# Patient Record
Sex: Female | Born: 1960 | Race: White | Hispanic: No | Smoking: Current every day smoker
Health system: Southern US, Community
[De-identification: ages and names within clinical notes are randomized; demographics above are authoritative.]

## PROBLEM LIST (undated history)

## (undated) DIAGNOSIS — E039 Hypothyroidism, unspecified: Secondary | ICD-10-CM

## (undated) DIAGNOSIS — D649 Anemia, unspecified: Secondary | ICD-10-CM

## (undated) DIAGNOSIS — N289 Disorder of kidney and ureter, unspecified: Secondary | ICD-10-CM

## (undated) DIAGNOSIS — J449 Chronic obstructive pulmonary disease, unspecified: Secondary | ICD-10-CM

## (undated) DIAGNOSIS — I10 Essential (primary) hypertension: Secondary | ICD-10-CM

## (undated) DIAGNOSIS — E785 Hyperlipidemia, unspecified: Secondary | ICD-10-CM

## (undated) DIAGNOSIS — D696 Thrombocytopenia, unspecified: Secondary | ICD-10-CM

## (undated) DIAGNOSIS — C801 Malignant (primary) neoplasm, unspecified: Secondary | ICD-10-CM

## (undated) DIAGNOSIS — F319 Bipolar disorder, unspecified: Secondary | ICD-10-CM

## (undated) DIAGNOSIS — E119 Type 2 diabetes mellitus without complications: Secondary | ICD-10-CM

## (undated) HISTORY — DX: Thrombocytopenia, unspecified: D69.6

## (undated) HISTORY — DX: Hyperlipidemia, unspecified: E78.5

## (undated) HISTORY — DX: Essential (primary) hypertension: I10

## (undated) HISTORY — DX: Bipolar disorder, unspecified: F31.9

## (undated) HISTORY — DX: Anemia, unspecified: D64.9

## (undated) HISTORY — PX: TONSILLECTOMY: SUR1361

## (undated) HISTORY — DX: Chronic obstructive pulmonary disease, unspecified: J44.9

## (undated) HISTORY — DX: Type 2 diabetes mellitus without complications: E11.9

## (undated) HISTORY — DX: Hypothyroidism, unspecified: E03.9

## (undated) HISTORY — DX: Disorder of kidney and ureter, unspecified: N28.9

---

## 1987-08-31 HISTORY — PX: ANKLE SURGERY: SHX546

## 1998-01-15 ENCOUNTER — Encounter: Admission: RE | Admit: 1998-01-15 | Discharge: 1998-01-15 | Payer: Self-pay | Admitting: Internal Medicine

## 1998-03-19 ENCOUNTER — Encounter: Admission: RE | Admit: 1998-03-19 | Discharge: 1998-03-19 | Payer: Self-pay | Admitting: Internal Medicine

## 1998-03-28 ENCOUNTER — Emergency Department (HOSPITAL_COMMUNITY): Admission: EM | Admit: 1998-03-28 | Discharge: 1998-03-28 | Payer: Self-pay | Admitting: *Deleted

## 1998-03-31 ENCOUNTER — Encounter: Admission: RE | Admit: 1998-03-31 | Discharge: 1998-03-31 | Payer: Self-pay | Admitting: Internal Medicine

## 1998-04-18 ENCOUNTER — Encounter: Admission: RE | Admit: 1998-04-18 | Discharge: 1998-04-18 | Payer: Self-pay | Admitting: Internal Medicine

## 1998-09-11 ENCOUNTER — Encounter: Admission: RE | Admit: 1998-09-11 | Discharge: 1998-09-11 | Payer: Self-pay | Admitting: Hematology and Oncology

## 1998-10-14 ENCOUNTER — Encounter: Admission: RE | Admit: 1998-10-14 | Discharge: 1998-10-14 | Payer: Self-pay | Admitting: Internal Medicine

## 1999-02-17 ENCOUNTER — Encounter: Payer: Self-pay | Admitting: Internal Medicine

## 1999-02-17 ENCOUNTER — Ambulatory Visit (HOSPITAL_COMMUNITY): Admission: RE | Admit: 1999-02-17 | Discharge: 1999-02-17 | Payer: Self-pay | Admitting: Internal Medicine

## 1999-02-17 ENCOUNTER — Encounter: Admission: RE | Admit: 1999-02-17 | Discharge: 1999-02-17 | Payer: Self-pay | Admitting: Internal Medicine

## 1999-03-09 ENCOUNTER — Encounter: Admission: RE | Admit: 1999-03-09 | Discharge: 1999-03-30 | Payer: Self-pay | Admitting: Internal Medicine

## 1999-03-26 ENCOUNTER — Encounter: Admission: RE | Admit: 1999-03-26 | Discharge: 1999-03-30 | Payer: Self-pay | Admitting: Internal Medicine

## 1999-03-31 ENCOUNTER — Encounter: Admission: RE | Admit: 1999-03-31 | Discharge: 1999-06-29 | Payer: Self-pay | Admitting: Internal Medicine

## 1999-05-10 ENCOUNTER — Encounter: Payer: Self-pay | Admitting: Emergency Medicine

## 1999-05-10 ENCOUNTER — Emergency Department (HOSPITAL_COMMUNITY): Admission: EM | Admit: 1999-05-10 | Discharge: 1999-05-10 | Payer: Self-pay | Admitting: *Deleted

## 1999-05-11 ENCOUNTER — Encounter: Admission: RE | Admit: 1999-05-11 | Discharge: 1999-05-11 | Payer: Self-pay | Admitting: Internal Medicine

## 1999-05-15 ENCOUNTER — Encounter: Admission: RE | Admit: 1999-05-15 | Discharge: 1999-05-15 | Payer: Self-pay | Admitting: Internal Medicine

## 1999-06-24 ENCOUNTER — Encounter: Admission: RE | Admit: 1999-06-24 | Discharge: 1999-06-24 | Payer: Self-pay | Admitting: Internal Medicine

## 1999-07-16 ENCOUNTER — Encounter: Admission: RE | Admit: 1999-07-16 | Discharge: 1999-10-14 | Payer: Self-pay | Admitting: Internal Medicine

## 1999-07-20 ENCOUNTER — Encounter: Admission: RE | Admit: 1999-07-20 | Discharge: 1999-07-20 | Payer: Self-pay | Admitting: Internal Medicine

## 1999-09-11 ENCOUNTER — Encounter: Admission: RE | Admit: 1999-09-11 | Discharge: 1999-09-11 | Payer: Self-pay | Admitting: Internal Medicine

## 1999-11-02 ENCOUNTER — Encounter: Admission: RE | Admit: 1999-11-02 | Discharge: 1999-11-02 | Payer: Self-pay | Admitting: Internal Medicine

## 1999-11-10 ENCOUNTER — Encounter: Admission: RE | Admit: 1999-11-10 | Discharge: 1999-11-10 | Payer: Self-pay | Admitting: Internal Medicine

## 1999-11-12 ENCOUNTER — Encounter: Admission: RE | Admit: 1999-11-12 | Discharge: 2000-02-10 | Payer: Self-pay | Admitting: Internal Medicine

## 1999-11-17 ENCOUNTER — Encounter: Admission: RE | Admit: 1999-11-17 | Discharge: 1999-11-17 | Payer: Self-pay | Admitting: Hematology and Oncology

## 1999-11-24 ENCOUNTER — Encounter: Admission: RE | Admit: 1999-11-24 | Discharge: 1999-11-24 | Payer: Self-pay | Admitting: Hematology and Oncology

## 2000-01-25 ENCOUNTER — Encounter: Admission: RE | Admit: 2000-01-25 | Discharge: 2000-01-25 | Payer: Self-pay | Admitting: Internal Medicine

## 2000-02-02 ENCOUNTER — Ambulatory Visit (HOSPITAL_COMMUNITY): Admission: RE | Admit: 2000-02-02 | Discharge: 2000-02-02 | Payer: Self-pay | Admitting: Internal Medicine

## 2000-02-11 ENCOUNTER — Encounter: Admission: RE | Admit: 2000-02-11 | Discharge: 2000-05-11 | Payer: Self-pay | Admitting: Internal Medicine

## 2000-05-12 ENCOUNTER — Encounter: Admission: RE | Admit: 2000-05-12 | Discharge: 2000-05-12 | Payer: Self-pay | Admitting: Hematology and Oncology

## 2000-05-30 ENCOUNTER — Encounter: Admission: RE | Admit: 2000-05-30 | Discharge: 2000-08-28 | Payer: Self-pay | Admitting: Internal Medicine

## 2000-07-07 ENCOUNTER — Encounter: Admission: RE | Admit: 2000-07-07 | Discharge: 2000-07-07 | Payer: Self-pay | Admitting: Hematology and Oncology

## 2000-07-25 ENCOUNTER — Encounter: Admission: RE | Admit: 2000-07-25 | Discharge: 2000-07-25 | Payer: Self-pay | Admitting: Internal Medicine

## 2000-08-11 ENCOUNTER — Encounter: Admission: RE | Admit: 2000-08-11 | Discharge: 2000-08-11 | Payer: Self-pay | Admitting: Hematology and Oncology

## 2000-09-15 ENCOUNTER — Encounter: Admission: RE | Admit: 2000-09-15 | Discharge: 2000-12-14 | Payer: Self-pay | Admitting: Internal Medicine

## 2000-10-31 ENCOUNTER — Encounter: Admission: RE | Admit: 2000-10-31 | Discharge: 2000-10-31 | Payer: Self-pay | Admitting: Internal Medicine

## 2000-11-11 ENCOUNTER — Encounter: Admission: RE | Admit: 2000-11-11 | Discharge: 2000-11-11 | Payer: Self-pay | Admitting: Internal Medicine

## 2001-01-09 ENCOUNTER — Encounter: Admission: RE | Admit: 2001-01-09 | Discharge: 2001-01-09 | Payer: Self-pay | Admitting: Internal Medicine

## 2001-01-13 ENCOUNTER — Encounter: Admission: RE | Admit: 2001-01-13 | Discharge: 2001-04-13 | Payer: Self-pay | Admitting: Internal Medicine

## 2001-01-16 ENCOUNTER — Encounter: Admission: RE | Admit: 2001-01-16 | Discharge: 2001-01-16 | Payer: Self-pay | Admitting: Internal Medicine

## 2001-01-26 ENCOUNTER — Encounter: Admission: RE | Admit: 2001-01-26 | Discharge: 2001-01-26 | Payer: Self-pay | Admitting: Internal Medicine

## 2001-01-27 ENCOUNTER — Emergency Department (HOSPITAL_COMMUNITY): Admission: EM | Admit: 2001-01-27 | Discharge: 2001-01-27 | Payer: Self-pay | Admitting: Emergency Medicine

## 2001-02-10 ENCOUNTER — Encounter: Admission: RE | Admit: 2001-02-10 | Discharge: 2001-02-10 | Payer: Self-pay | Admitting: Internal Medicine

## 2001-02-17 ENCOUNTER — Encounter: Admission: RE | Admit: 2001-02-17 | Discharge: 2001-02-17 | Payer: Self-pay | Admitting: Internal Medicine

## 2001-02-21 ENCOUNTER — Emergency Department (HOSPITAL_COMMUNITY): Admission: EM | Admit: 2001-02-21 | Discharge: 2001-02-22 | Payer: Self-pay | Admitting: Orthopedic Surgery

## 2001-02-23 ENCOUNTER — Emergency Department (HOSPITAL_COMMUNITY): Admission: EM | Admit: 2001-02-23 | Discharge: 2001-02-23 | Payer: Self-pay | Admitting: Emergency Medicine

## 2001-02-24 ENCOUNTER — Encounter: Admission: RE | Admit: 2001-02-24 | Discharge: 2001-02-24 | Payer: Self-pay | Admitting: Internal Medicine

## 2001-03-03 ENCOUNTER — Encounter: Payer: Self-pay | Admitting: Emergency Medicine

## 2001-03-03 ENCOUNTER — Inpatient Hospital Stay (HOSPITAL_COMMUNITY): Admission: EM | Admit: 2001-03-03 | Discharge: 2001-03-08 | Payer: Self-pay | Admitting: Emergency Medicine

## 2001-03-27 ENCOUNTER — Encounter: Admission: RE | Admit: 2001-03-27 | Discharge: 2001-03-27 | Payer: Self-pay | Admitting: Internal Medicine

## 2001-03-29 ENCOUNTER — Encounter: Payer: Self-pay | Admitting: Emergency Medicine

## 2001-03-29 ENCOUNTER — Emergency Department (HOSPITAL_COMMUNITY): Admission: EM | Admit: 2001-03-29 | Discharge: 2001-03-29 | Payer: Self-pay | Admitting: Emergency Medicine

## 2001-04-01 ENCOUNTER — Encounter: Payer: Self-pay | Admitting: *Deleted

## 2001-04-01 ENCOUNTER — Emergency Department (HOSPITAL_COMMUNITY): Admission: EM | Admit: 2001-04-01 | Discharge: 2001-04-02 | Payer: Self-pay | Admitting: *Deleted

## 2001-04-04 ENCOUNTER — Inpatient Hospital Stay (HOSPITAL_COMMUNITY): Admission: EM | Admit: 2001-04-04 | Discharge: 2001-04-10 | Payer: Self-pay | Admitting: Psychiatry

## 2001-04-27 ENCOUNTER — Emergency Department (HOSPITAL_COMMUNITY): Admission: EM | Admit: 2001-04-27 | Discharge: 2001-04-28 | Payer: Self-pay | Admitting: Emergency Medicine

## 2001-04-28 ENCOUNTER — Encounter: Payer: Self-pay | Admitting: Emergency Medicine

## 2001-05-29 ENCOUNTER — Encounter: Admission: RE | Admit: 2001-05-29 | Discharge: 2001-05-29 | Payer: Self-pay | Admitting: Internal Medicine

## 2001-06-21 ENCOUNTER — Encounter (INDEPENDENT_AMBULATORY_CARE_PROVIDER_SITE_OTHER): Payer: Self-pay | Admitting: *Deleted

## 2001-06-21 ENCOUNTER — Encounter: Payer: Self-pay | Admitting: Internal Medicine

## 2001-06-21 ENCOUNTER — Encounter: Admission: RE | Admit: 2001-06-21 | Discharge: 2001-06-21 | Payer: Self-pay | Admitting: Internal Medicine

## 2001-07-11 ENCOUNTER — Encounter: Admission: RE | Admit: 2001-07-11 | Discharge: 2001-07-11 | Payer: Self-pay | Admitting: Internal Medicine

## 2001-07-25 ENCOUNTER — Encounter: Admission: RE | Admit: 2001-07-25 | Discharge: 2001-07-25 | Payer: Self-pay | Admitting: Internal Medicine

## 2001-08-21 ENCOUNTER — Encounter: Admission: RE | Admit: 2001-08-21 | Discharge: 2001-08-21 | Payer: Self-pay | Admitting: Surgery

## 2001-08-21 ENCOUNTER — Encounter: Payer: Self-pay | Admitting: Surgery

## 2001-08-24 ENCOUNTER — Encounter (INDEPENDENT_AMBULATORY_CARE_PROVIDER_SITE_OTHER): Payer: Self-pay | Admitting: *Deleted

## 2001-08-24 ENCOUNTER — Encounter: Payer: Self-pay | Admitting: Surgery

## 2001-08-24 ENCOUNTER — Encounter: Admission: RE | Admit: 2001-08-24 | Discharge: 2001-08-24 | Payer: Self-pay | Admitting: Surgery

## 2001-08-24 ENCOUNTER — Ambulatory Visit (HOSPITAL_BASED_OUTPATIENT_CLINIC_OR_DEPARTMENT_OTHER): Admission: RE | Admit: 2001-08-24 | Discharge: 2001-08-24 | Payer: Self-pay | Admitting: Surgery

## 2001-11-13 ENCOUNTER — Encounter: Admission: RE | Admit: 2001-11-13 | Discharge: 2001-11-13 | Payer: Self-pay | Admitting: Internal Medicine

## 2002-03-12 ENCOUNTER — Encounter: Admission: RE | Admit: 2002-03-12 | Discharge: 2002-03-12 | Payer: Self-pay | Admitting: Internal Medicine

## 2002-04-12 ENCOUNTER — Encounter: Admission: RE | Admit: 2002-04-12 | Discharge: 2002-07-11 | Payer: Self-pay | Admitting: Internal Medicine

## 2002-07-09 ENCOUNTER — Encounter: Admission: RE | Admit: 2002-07-09 | Discharge: 2002-07-09 | Payer: Self-pay | Admitting: Internal Medicine

## 2002-07-23 ENCOUNTER — Encounter: Admission: RE | Admit: 2002-07-23 | Discharge: 2002-07-23 | Payer: Self-pay | Admitting: Internal Medicine

## 2002-07-23 ENCOUNTER — Encounter: Payer: Self-pay | Admitting: Internal Medicine

## 2002-11-12 ENCOUNTER — Encounter: Admission: RE | Admit: 2002-11-12 | Discharge: 2002-11-12 | Payer: Self-pay | Admitting: Internal Medicine

## 2003-02-05 ENCOUNTER — Encounter: Admission: RE | Admit: 2003-02-05 | Discharge: 2003-02-05 | Payer: Self-pay | Admitting: Internal Medicine

## 2003-02-12 ENCOUNTER — Encounter (HOSPITAL_BASED_OUTPATIENT_CLINIC_OR_DEPARTMENT_OTHER): Admission: RE | Admit: 2003-02-12 | Discharge: 2003-05-13 | Payer: Self-pay | Admitting: Internal Medicine

## 2003-02-12 ENCOUNTER — Encounter: Admission: RE | Admit: 2003-02-12 | Discharge: 2003-02-12 | Payer: Self-pay | Admitting: Internal Medicine

## 2003-04-08 ENCOUNTER — Encounter: Admission: RE | Admit: 2003-04-08 | Discharge: 2003-04-08 | Payer: Self-pay | Admitting: Internal Medicine

## 2003-05-10 ENCOUNTER — Encounter: Admission: RE | Admit: 2003-05-10 | Discharge: 2003-05-10 | Payer: Self-pay | Admitting: Internal Medicine

## 2003-05-21 ENCOUNTER — Encounter (HOSPITAL_BASED_OUTPATIENT_CLINIC_OR_DEPARTMENT_OTHER): Admission: RE | Admit: 2003-05-21 | Discharge: 2003-08-19 | Payer: Self-pay | Admitting: Orthopedic Surgery

## 2003-07-05 ENCOUNTER — Encounter: Admission: RE | Admit: 2003-07-05 | Discharge: 2003-07-05 | Payer: Self-pay | Admitting: Internal Medicine

## 2003-08-08 ENCOUNTER — Emergency Department (HOSPITAL_COMMUNITY): Admission: EM | Admit: 2003-08-08 | Discharge: 2003-08-08 | Payer: Self-pay | Admitting: Emergency Medicine

## 2003-08-12 ENCOUNTER — Encounter: Admission: RE | Admit: 2003-08-12 | Discharge: 2003-08-12 | Payer: Self-pay | Admitting: Internal Medicine

## 2003-08-13 ENCOUNTER — Encounter: Admission: RE | Admit: 2003-08-13 | Discharge: 2003-08-13 | Payer: Self-pay | Admitting: Internal Medicine

## 2003-08-13 ENCOUNTER — Encounter (INDEPENDENT_AMBULATORY_CARE_PROVIDER_SITE_OTHER): Payer: Self-pay | Admitting: Internal Medicine

## 2003-09-05 ENCOUNTER — Encounter (HOSPITAL_BASED_OUTPATIENT_CLINIC_OR_DEPARTMENT_OTHER): Admission: RE | Admit: 2003-09-05 | Discharge: 2003-09-17 | Payer: Self-pay | Admitting: Internal Medicine

## 2003-10-04 ENCOUNTER — Encounter: Admission: RE | Admit: 2003-10-04 | Discharge: 2003-10-04 | Payer: Self-pay | Admitting: Internal Medicine

## 2003-12-05 ENCOUNTER — Encounter (HOSPITAL_BASED_OUTPATIENT_CLINIC_OR_DEPARTMENT_OTHER): Admission: RE | Admit: 2003-12-05 | Discharge: 2004-03-04 | Payer: Self-pay | Admitting: Internal Medicine

## 2004-02-10 ENCOUNTER — Encounter: Admission: RE | Admit: 2004-02-10 | Discharge: 2004-02-10 | Payer: Self-pay | Admitting: Internal Medicine

## 2004-03-05 ENCOUNTER — Encounter: Admission: RE | Admit: 2004-03-05 | Discharge: 2004-03-05 | Payer: Self-pay | Admitting: Internal Medicine

## 2004-03-31 ENCOUNTER — Emergency Department (HOSPITAL_COMMUNITY): Admission: EM | Admit: 2004-03-31 | Discharge: 2004-03-31 | Payer: Self-pay | Admitting: Emergency Medicine

## 2004-03-31 ENCOUNTER — Inpatient Hospital Stay (HOSPITAL_COMMUNITY): Admission: EM | Admit: 2004-03-31 | Discharge: 2004-04-01 | Payer: Self-pay | Admitting: Emergency Medicine

## 2004-04-08 ENCOUNTER — Encounter: Admission: RE | Admit: 2004-04-08 | Discharge: 2004-04-08 | Payer: Self-pay | Admitting: Internal Medicine

## 2004-04-16 ENCOUNTER — Encounter: Admission: RE | Admit: 2004-04-16 | Discharge: 2004-04-16 | Payer: Self-pay | Admitting: Internal Medicine

## 2004-04-18 ENCOUNTER — Inpatient Hospital Stay (HOSPITAL_COMMUNITY): Admission: EM | Admit: 2004-04-18 | Discharge: 2004-04-23 | Payer: Self-pay | Admitting: Emergency Medicine

## 2004-06-15 ENCOUNTER — Ambulatory Visit: Payer: Self-pay | Admitting: Internal Medicine

## 2004-06-15 ENCOUNTER — Inpatient Hospital Stay (HOSPITAL_COMMUNITY): Admission: RE | Admit: 2004-06-15 | Discharge: 2004-06-18 | Payer: Self-pay | Admitting: Internal Medicine

## 2004-06-15 ENCOUNTER — Ambulatory Visit: Payer: Self-pay | Admitting: Infectious Diseases

## 2004-06-17 ENCOUNTER — Ambulatory Visit: Payer: Self-pay | Admitting: Psychiatry

## 2004-06-18 ENCOUNTER — Inpatient Hospital Stay (HOSPITAL_COMMUNITY): Admission: RE | Admit: 2004-06-18 | Discharge: 2004-06-27 | Payer: Self-pay | Admitting: Psychiatry

## 2004-07-07 ENCOUNTER — Emergency Department (HOSPITAL_COMMUNITY): Admission: EM | Admit: 2004-07-07 | Discharge: 2004-07-08 | Payer: Self-pay | Admitting: Emergency Medicine

## 2004-11-05 ENCOUNTER — Ambulatory Visit: Payer: Self-pay | Admitting: Internal Medicine

## 2004-11-10 ENCOUNTER — Ambulatory Visit (HOSPITAL_COMMUNITY): Admission: RE | Admit: 2004-11-10 | Discharge: 2004-11-10 | Payer: Self-pay | Admitting: Internal Medicine

## 2004-11-13 ENCOUNTER — Encounter (HOSPITAL_BASED_OUTPATIENT_CLINIC_OR_DEPARTMENT_OTHER): Admission: RE | Admit: 2004-11-13 | Discharge: 2004-11-20 | Payer: Self-pay | Admitting: Internal Medicine

## 2005-02-26 ENCOUNTER — Emergency Department (HOSPITAL_COMMUNITY): Admission: EM | Admit: 2005-02-26 | Discharge: 2005-02-26 | Payer: Self-pay | Admitting: Emergency Medicine

## 2005-03-11 ENCOUNTER — Ambulatory Visit: Payer: Self-pay | Admitting: Internal Medicine

## 2005-03-11 ENCOUNTER — Inpatient Hospital Stay (HOSPITAL_COMMUNITY): Admission: AD | Admit: 2005-03-11 | Discharge: 2005-03-17 | Payer: Self-pay | Admitting: Internal Medicine

## 2005-04-17 ENCOUNTER — Emergency Department (HOSPITAL_COMMUNITY): Admission: EM | Admit: 2005-04-17 | Discharge: 2005-04-17 | Payer: Self-pay | Admitting: Emergency Medicine

## 2005-04-17 ENCOUNTER — Emergency Department (HOSPITAL_COMMUNITY): Admission: EM | Admit: 2005-04-17 | Discharge: 2005-04-18 | Payer: Self-pay | Admitting: Emergency Medicine

## 2005-04-27 ENCOUNTER — Ambulatory Visit: Payer: Self-pay | Admitting: Internal Medicine

## 2005-04-27 ENCOUNTER — Emergency Department (HOSPITAL_COMMUNITY): Admission: EM | Admit: 2005-04-27 | Discharge: 2005-04-27 | Payer: Self-pay | Admitting: Emergency Medicine

## 2005-04-27 ENCOUNTER — Inpatient Hospital Stay (HOSPITAL_COMMUNITY): Admission: AD | Admit: 2005-04-27 | Discharge: 2005-05-07 | Payer: Self-pay | Admitting: Internal Medicine

## 2005-04-29 ENCOUNTER — Ambulatory Visit: Payer: Self-pay | Admitting: Hospitalist
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
## 2006-01-09 ENCOUNTER — Emergency Department (HOSPITAL_COMMUNITY): Admission: EM | Admit: 2006-01-09 | Discharge: 2006-01-09 | Payer: Self-pay | Admitting: Emergency Medicine

## 2006-01-19 ENCOUNTER — Emergency Department (HOSPITAL_COMMUNITY): Admission: EM | Admit: 2006-01-19 | Discharge: 2006-01-19 | Payer: Self-pay | Admitting: Emergency Medicine

## 2006-07-28 ENCOUNTER — Emergency Department (HOSPITAL_COMMUNITY): Admission: EM | Admit: 2006-07-28 | Discharge: 2006-07-29 | Payer: Self-pay | Admitting: Emergency Medicine

## 2006-08-15 DIAGNOSIS — J4489 Other specified chronic obstructive pulmonary disease: Secondary | ICD-10-CM | POA: Insufficient documentation

## 2006-08-15 DIAGNOSIS — L708 Other acne: Secondary | ICD-10-CM | POA: Insufficient documentation

## 2006-08-15 DIAGNOSIS — F319 Bipolar disorder, unspecified: Secondary | ICD-10-CM | POA: Insufficient documentation

## 2006-08-15 DIAGNOSIS — F17200 Nicotine dependence, unspecified, uncomplicated: Secondary | ICD-10-CM

## 2006-08-15 DIAGNOSIS — J449 Chronic obstructive pulmonary disease, unspecified: Secondary | ICD-10-CM

## 2006-08-15 DIAGNOSIS — L0292 Furuncle, unspecified: Secondary | ICD-10-CM | POA: Insufficient documentation

## 2006-08-15 DIAGNOSIS — N3941 Urge incontinence: Secondary | ICD-10-CM

## 2006-08-15 DIAGNOSIS — R609 Edema, unspecified: Secondary | ICD-10-CM | POA: Insufficient documentation

## 2006-08-15 DIAGNOSIS — L0293 Carbuncle, unspecified: Secondary | ICD-10-CM

## 2006-08-15 DIAGNOSIS — L719 Rosacea, unspecified: Secondary | ICD-10-CM

## 2006-08-15 DIAGNOSIS — E119 Type 2 diabetes mellitus without complications: Secondary | ICD-10-CM

## 2007-01-23 ENCOUNTER — Emergency Department (HOSPITAL_COMMUNITY): Admission: EM | Admit: 2007-01-23 | Discharge: 2007-01-23 | Payer: Self-pay | Admitting: Infectious Diseases

## 2007-01-29 ENCOUNTER — Emergency Department (HOSPITAL_COMMUNITY): Admission: EM | Admit: 2007-01-29 | Discharge: 2007-01-29 | Payer: Self-pay | Admitting: Emergency Medicine

## 2007-02-03 ENCOUNTER — Inpatient Hospital Stay (HOSPITAL_COMMUNITY): Admission: EM | Admit: 2007-02-03 | Discharge: 2007-02-05 | Payer: Self-pay | Admitting: Emergency Medicine

## 2007-02-03 ENCOUNTER — Ambulatory Visit: Payer: Self-pay | Admitting: Vascular Surgery

## 2007-02-03 ENCOUNTER — Ambulatory Visit: Payer: Self-pay | Admitting: Family Medicine

## 2007-02-23 ENCOUNTER — Ambulatory Visit: Payer: Self-pay | Admitting: Psychiatry

## 2007-03-01 ENCOUNTER — Emergency Department (HOSPITAL_COMMUNITY): Admission: EM | Admit: 2007-03-01 | Discharge: 2007-03-01 | Payer: Self-pay | Admitting: Emergency Medicine

## 2007-03-09 ENCOUNTER — Emergency Department (HOSPITAL_COMMUNITY): Admission: EM | Admit: 2007-03-09 | Discharge: 2007-03-09 | Payer: Self-pay | Admitting: Emergency Medicine

## 2007-03-28 ENCOUNTER — Emergency Department (HOSPITAL_COMMUNITY): Admission: EM | Admit: 2007-03-28 | Discharge: 2007-03-29 | Payer: Self-pay | Admitting: Emergency Medicine

## 2007-07-11 ENCOUNTER — Encounter (INDEPENDENT_AMBULATORY_CARE_PROVIDER_SITE_OTHER): Payer: Self-pay | Admitting: Internal Medicine
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
## 2007-10-16 ENCOUNTER — Inpatient Hospital Stay (HOSPITAL_COMMUNITY): Admission: EM | Admit: 2007-10-16 | Discharge: 2007-10-25 | Payer: Self-pay | Admitting: Emergency Medicine

## 2007-10-16 ENCOUNTER — Ambulatory Visit: Payer: Self-pay | Admitting: Vascular Surgery

## 2007-10-17 ENCOUNTER — Encounter (INDEPENDENT_AMBULATORY_CARE_PROVIDER_SITE_OTHER): Payer: Self-pay | Admitting: *Deleted

## 2007-10-21 ENCOUNTER — Ambulatory Visit: Payer: Self-pay | Admitting: Infectious Disease

## 2007-11-10 ENCOUNTER — Emergency Department (HOSPITAL_COMMUNITY): Admission: EM | Admit: 2007-11-10 | Discharge: 2007-11-11 | Payer: Self-pay | Admitting: Emergency Medicine

## 2008-01-24 ENCOUNTER — Emergency Department: Payer: Self-pay | Admitting: Internal Medicine

## 2008-06-04 ENCOUNTER — Encounter (INDEPENDENT_AMBULATORY_CARE_PROVIDER_SITE_OTHER): Payer: Self-pay | Admitting: Internal Medicine

## 2008-06-11 ENCOUNTER — Ambulatory Visit (HOSPITAL_BASED_OUTPATIENT_CLINIC_OR_DEPARTMENT_OTHER): Admission: RE | Admit: 2008-06-11 | Discharge: 2008-06-11 | Payer: Self-pay | Admitting: Internal Medicine

## 2008-08-30 HISTORY — PX: BREAST SURGERY: SHX581

## 2009-10-17 ENCOUNTER — Emergency Department (HOSPITAL_COMMUNITY): Admission: EM | Admit: 2009-10-17 | Discharge: 2009-10-24 | Payer: Self-pay | Admitting: Emergency Medicine

## 2009-10-18 ENCOUNTER — Ambulatory Visit: Payer: Self-pay | Admitting: Psychiatry

## 2010-02-09 ENCOUNTER — Emergency Department (HOSPITAL_COMMUNITY): Admission: EM | Admit: 2010-02-09 | Discharge: 2010-02-09 | Payer: Self-pay | Admitting: Emergency Medicine

## 2010-02-12 ENCOUNTER — Inpatient Hospital Stay (HOSPITAL_COMMUNITY): Admission: EM | Admit: 2010-02-12 | Discharge: 2010-02-25 | Payer: Self-pay | Admitting: Emergency Medicine

## 2010-02-20 ENCOUNTER — Ambulatory Visit: Payer: Self-pay | Admitting: Psychiatry

## 2010-02-21 ENCOUNTER — Ambulatory Visit: Payer: Self-pay | Admitting: Psychiatry

## 2010-02-23 ENCOUNTER — Ambulatory Visit: Payer: Self-pay | Admitting: Psychiatry

## 2010-02-25 ENCOUNTER — Ambulatory Visit: Payer: Self-pay | Admitting: Psychiatry

## 2010-07-15 ENCOUNTER — Encounter: Admission: RE | Admit: 2010-07-15 | Discharge: 2010-07-15 | Payer: Self-pay | Admitting: Family Medicine

## 2010-07-29 ENCOUNTER — Encounter: Admission: RE | Admit: 2010-07-29 | Discharge: 2010-07-29 | Payer: Self-pay | Admitting: Family Medicine

## 2010-07-30 ENCOUNTER — Encounter: Admission: RE | Admit: 2010-07-30 | Discharge: 2010-07-30 | Payer: Self-pay | Admitting: Family Medicine

## 2010-08-30 DIAGNOSIS — C801 Malignant (primary) neoplasm, unspecified: Secondary | ICD-10-CM

## 2010-08-30 HISTORY — DX: Malignant (primary) neoplasm, unspecified: C80.1

## 2010-09-20 ENCOUNTER — Encounter: Payer: Self-pay | Admitting: Family Medicine

## 2010-09-23 ENCOUNTER — Other Ambulatory Visit: Payer: Self-pay | Admitting: Family Medicine

## 2010-09-23 ENCOUNTER — Encounter
Admission: RE | Admit: 2010-09-23 | Discharge: 2010-09-23 | Payer: Self-pay | Source: Home / Self Care | Attending: Family Medicine | Admitting: Family Medicine

## 2010-09-23 DIAGNOSIS — Z9889 Other specified postprocedural states: Secondary | ICD-10-CM

## 2010-09-30 ENCOUNTER — Encounter: Payer: Self-pay | Admitting: Family Medicine

## 2010-10-01 ENCOUNTER — Ambulatory Visit
Admission: RE | Admit: 2010-10-01 | Discharge: 2010-10-01 | Disposition: A | Discharge status: Home / Self Care | Payer: Medicaid Other | Source: Ambulatory Visit | Attending: Family Medicine | Admitting: Family Medicine

## 2010-10-01 DIAGNOSIS — Z9889 Other specified postprocedural states: Secondary | ICD-10-CM

## 2010-11-15 LAB — URINALYSIS, ROUTINE W REFLEX MICROSCOPIC
Bilirubin Urine: NEGATIVE
Ketones, ur: NEGATIVE mg/dL
pH: 5 (ref 5.0–8.0)

## 2010-11-15 LAB — BASIC METABOLIC PANEL
BUN: 28 mg/dL — ABNORMAL HIGH (ref 6–23)
BUN: 34 mg/dL — ABNORMAL HIGH (ref 6–23)
BUN: 37 mg/dL — ABNORMAL HIGH (ref 6–23)
BUN: 37 mg/dL — ABNORMAL HIGH (ref 6–23)
BUN: 41 mg/dL — ABNORMAL HIGH (ref 6–23)
BUN: 56 mg/dL — ABNORMAL HIGH (ref 6–23)
CO2: 21 mEq/L (ref 19–32)
CO2: 21 mEq/L (ref 19–32)
CO2: 24 mEq/L (ref 19–32)
CO2: 25 mEq/L (ref 19–32)
Calcium: 8.7 mg/dL (ref 8.4–10.5)
Calcium: 9.3 mg/dL (ref 8.4–10.5)
Calcium: 9.7 mg/dL (ref 8.4–10.5)
Calcium: 9.8 mg/dL (ref 8.4–10.5)
Chloride: 106 mEq/L (ref 96–112)
Chloride: 109 mEq/L (ref 96–112)
Chloride: 116 mEq/L — ABNORMAL HIGH (ref 96–112)
Chloride: 119 mEq/L — ABNORMAL HIGH (ref 96–112)
Chloride: 120 mEq/L — ABNORMAL HIGH (ref 96–112)
Chloride: 122 mEq/L — ABNORMAL HIGH (ref 96–112)
Creatinine, Ser: 1.65 mg/dL — ABNORMAL HIGH (ref 0.4–1.2)
Creatinine, Ser: 1.78 mg/dL — ABNORMAL HIGH (ref 0.4–1.2)
Creatinine, Ser: 1.81 mg/dL — ABNORMAL HIGH (ref 0.4–1.2)
Creatinine, Ser: 1.89 mg/dL — ABNORMAL HIGH (ref 0.4–1.2)
Creatinine, Ser: 1.94 mg/dL — ABNORMAL HIGH (ref 0.4–1.2)
Creatinine, Ser: 2.05 mg/dL — ABNORMAL HIGH (ref 0.4–1.2)
Creatinine, Ser: 2.59 mg/dL — ABNORMAL HIGH (ref 0.4–1.2)
GFR calc Af Amer: 19 mL/min — ABNORMAL LOW (ref >60)
GFR calc Af Amer: 24 mL/min — ABNORMAL LOW (ref >60)
GFR calc Af Amer: 28 mL/min — ABNORMAL LOW (ref >60)
GFR calc Af Amer: 33 mL/min — ABNORMAL LOW (ref >60)
GFR calc Af Amer: 36 mL/min — ABNORMAL LOW (ref >60)
GFR calc Af Amer: 39 mL/min — ABNORMAL LOW (ref >60)
GFR calc Af Amer: 40 mL/min — ABNORMAL LOW (ref >60)
GFR calc non Af Amer: 26 mL/min — ABNORMAL LOW (ref >60)
GFR calc non Af Amer: 27 mL/min — ABNORMAL LOW (ref >60)
GFR calc non Af Amer: 27 mL/min — ABNORMAL LOW (ref >60)
GFR calc non Af Amer: 28 mL/min — ABNORMAL LOW (ref >60)
GFR calc non Af Amer: 29 mL/min — ABNORMAL LOW (ref >60)
GFR calc non Af Amer: 30 mL/min — ABNORMAL LOW (ref >60)
GFR calc non Af Amer: 32 mL/min — ABNORMAL LOW (ref >60)
GFR calc non Af Amer: 33 mL/min — ABNORMAL LOW (ref >60)
Glucose, Bld: 104 mg/dL — ABNORMAL HIGH (ref 70–99)
Glucose, Bld: 122 mg/dL — ABNORMAL HIGH (ref 70–99)
Glucose, Bld: 142 mg/dL — ABNORMAL HIGH (ref 70–99)
Glucose, Bld: 157 mg/dL — ABNORMAL HIGH (ref 70–99)
Glucose, Bld: 96 mg/dL (ref 70–99)
Glucose, Bld: 96 mg/dL (ref 70–99)
Potassium: 3.5 mEq/L (ref 3.5–5.1)
Potassium: 3.9 mEq/L (ref 3.5–5.1)
Potassium: 3.9 mEq/L (ref 3.5–5.1)
Potassium: 4.1 mEq/L (ref 3.5–5.1)
Potassium: 4.2 mEq/L (ref 3.5–5.1)
Sodium: 132 mEq/L — ABNORMAL LOW (ref 135–145)
Sodium: 139 mEq/L (ref 135–145)
Sodium: 140 mEq/L (ref 135–145)
Sodium: 145 mEq/L (ref 135–145)
Sodium: 146 mEq/L — ABNORMAL HIGH (ref 135–145)
Sodium: 147 mEq/L — ABNORMAL HIGH (ref 135–145)
Sodium: 149 mEq/L — ABNORMAL HIGH (ref 135–145)

## 2010-11-15 LAB — GLUCOSE, CAPILLARY
Glucose-Capillary: 103 mg/dL — ABNORMAL HIGH (ref 70–99)
Glucose-Capillary: 103 mg/dL — ABNORMAL HIGH (ref 70–99)
Glucose-Capillary: 105 mg/dL — ABNORMAL HIGH (ref 70–99)
Glucose-Capillary: 109 mg/dL — ABNORMAL HIGH (ref 70–99)
Glucose-Capillary: 118 mg/dL — ABNORMAL HIGH (ref 70–99)
Glucose-Capillary: 118 mg/dL — ABNORMAL HIGH (ref 70–99)
Glucose-Capillary: 122 mg/dL — ABNORMAL HIGH (ref 70–99)
Glucose-Capillary: 129 mg/dL — ABNORMAL HIGH (ref 70–99)
Glucose-Capillary: 131 mg/dL — ABNORMAL HIGH (ref 70–99)
Glucose-Capillary: 132 mg/dL — ABNORMAL HIGH (ref 70–99)
Glucose-Capillary: 133 mg/dL — ABNORMAL HIGH (ref 70–99)
Glucose-Capillary: 133 mg/dL — ABNORMAL HIGH (ref 70–99)
Glucose-Capillary: 142 mg/dL — ABNORMAL HIGH (ref 70–99)
Glucose-Capillary: 143 mg/dL — ABNORMAL HIGH (ref 70–99)
Glucose-Capillary: 155 mg/dL — ABNORMAL HIGH (ref 70–99)
Glucose-Capillary: 157 mg/dL — ABNORMAL HIGH (ref 70–99)
Glucose-Capillary: 160 mg/dL — ABNORMAL HIGH (ref 70–99)
Glucose-Capillary: 162 mg/dL — ABNORMAL HIGH (ref 70–99)
Glucose-Capillary: 162 mg/dL — ABNORMAL HIGH (ref 70–99)
Glucose-Capillary: 166 mg/dL — ABNORMAL HIGH (ref 70–99)
Glucose-Capillary: 172 mg/dL — ABNORMAL HIGH (ref 70–99)
Glucose-Capillary: 182 mg/dL — ABNORMAL HIGH (ref 70–99)
Glucose-Capillary: 184 mg/dL — ABNORMAL HIGH (ref 70–99)
Glucose-Capillary: 194 mg/dL — ABNORMAL HIGH (ref 70–99)
Glucose-Capillary: 199 mg/dL — ABNORMAL HIGH (ref 70–99)
Glucose-Capillary: 211 mg/dL — ABNORMAL HIGH (ref 70–99)
Glucose-Capillary: 272 mg/dL — ABNORMAL HIGH (ref 70–99)
Glucose-Capillary: 83 mg/dL (ref 70–99)
Glucose-Capillary: 85 mg/dL (ref 70–99)
Glucose-Capillary: 85 mg/dL (ref 70–99)
Glucose-Capillary: 87 mg/dL (ref 70–99)
Glucose-Capillary: 89 mg/dL (ref 70–99)
Glucose-Capillary: 93 mg/dL (ref 70–99)
Glucose-Capillary: 94 mg/dL (ref 70–99)
Glucose-Capillary: 98 mg/dL (ref 70–99)

## 2010-11-15 LAB — HEPATIC FUNCTION PANEL
Albumin: 1.6 g/dL — ABNORMAL LOW (ref 3.5–5.2)
Indirect Bilirubin: 0.6 mg/dL (ref 0.3–0.9)
Total Protein: 5.2 g/dL — ABNORMAL LOW (ref 6.0–8.3)

## 2010-11-15 LAB — CBC
HCT: 34.1 % — ABNORMAL LOW (ref 36.0–46.0)
HCT: 38.1 % (ref 36.0–46.0)
Hemoglobin: 10.6 g/dL — ABNORMAL LOW (ref 12.0–15.0)
Hemoglobin: 11.6 g/dL — ABNORMAL LOW (ref 12.0–15.0)
Hemoglobin: 12.3 g/dL (ref 12.0–15.0)
MCHC: 32.3 g/dL (ref 30.0–36.0)
MCHC: 34.6 g/dL (ref 30.0–36.0)
MCV: 96.8 fL (ref 78.0–100.0)
MCV: 97.8 fL (ref 78.0–100.0)
Platelets: 109 10*3/uL — ABNORMAL LOW (ref 150–400)
Platelets: 114 10*3/uL — ABNORMAL LOW (ref 150–400)
Platelets: 132 10*3/uL — ABNORMAL LOW (ref 150–400)
Platelets: 95 10*3/uL — ABNORMAL LOW (ref 150–400)
RBC: 3.52 MIL/uL — ABNORMAL LOW (ref 3.87–5.11)
RDW: 13.2 % (ref 11.5–15.5)
RDW: 13.5 % (ref 11.5–15.5)
RDW: 13.6 % (ref 11.5–15.5)
WBC: 10.3 10*3/uL (ref 4.0–10.5)
WBC: 7.1 10*3/uL (ref 4.0–10.5)
WBC: 9.1 10*3/uL (ref 4.0–10.5)
WBC: 9.5 10*3/uL (ref 4.0–10.5)

## 2010-11-15 LAB — BLOOD GAS, ARTERIAL
Acid-base deficit: 6 mmol/L — ABNORMAL HIGH (ref 0.0–2.0)
Bicarbonate: 23.5 mEq/L (ref 20.0–24.0)
Drawn by: 257701
Drawn by: 331471
FIO2: 0.21 %
O2 Content: 3 L/min
O2 Saturation: 87.4 %
TCO2: 21.3 mmol/L (ref 0–100)
pCO2 arterial: 48.1 mmHg — ABNORMAL HIGH (ref 35.0–45.0)
pCO2 arterial: 50.5 mmHg — ABNORMAL HIGH (ref 35.0–45.0)
pO2, Arterial: 57.3 mmHg — ABNORMAL LOW (ref 80.0–100.0)
pO2, Arterial: 80.9 mmHg (ref 80.0–100.0)

## 2010-11-15 LAB — HEMOGLOBIN A1C
Hgb A1c MFr Bld: 5.8 % — ABNORMAL HIGH (ref <5.7)
Mean Plasma Glucose: 120 mg/dL — ABNORMAL HIGH (ref <117)

## 2010-11-15 LAB — DIFFERENTIAL
Basophils Absolute: 0 10*3/uL (ref 0.0–0.1)
Eosinophils Absolute: 0.2 10*3/uL (ref 0.0–0.7)
Eosinophils Relative: 3 % (ref 0–5)
Lymphs Abs: 0.6 10*3/uL — ABNORMAL LOW (ref 0.7–4.0)
Monocytes Absolute: 0.5 10*3/uL (ref 0.1–1.0)

## 2010-11-15 LAB — URINE CULTURE
Culture: NO GROWTH
Special Requests: NEGATIVE

## 2010-11-15 LAB — AMMONIA: Ammonia: 31 umol/L (ref 11–35)

## 2010-11-18 LAB — PREGNANCY, URINE: Preg Test, Ur: NEGATIVE

## 2010-11-18 LAB — DIFFERENTIAL
Basophils Relative: 0 % (ref 0–1)
Eosinophils Absolute: 0.2 10*3/uL (ref 0.0–0.7)
Neutrophils Relative %: 76 % (ref 43–77)

## 2010-11-18 LAB — URINALYSIS, ROUTINE W REFLEX MICROSCOPIC
Glucose, UA: NEGATIVE mg/dL
Ketones, ur: NEGATIVE mg/dL
Specific Gravity, Urine: 1.012 (ref 1.005–1.030)
pH: 6.5 (ref 5.0–8.0)

## 2010-11-18 LAB — POCT I-STAT, CHEM 8
HCT: 34 % — ABNORMAL LOW (ref 36.0–46.0)
Hemoglobin: 11.6 g/dL — ABNORMAL LOW (ref 12.0–15.0)
Potassium: 4.3 mEq/L (ref 3.5–5.1)
Sodium: 138 mEq/L (ref 135–145)

## 2010-11-18 LAB — RAPID URINE DRUG SCREEN, HOSP PERFORMED
Cocaine: NOT DETECTED
Opiates: NOT DETECTED

## 2010-11-18 LAB — GLUCOSE, CAPILLARY
Glucose-Capillary: 133 mg/dL — ABNORMAL HIGH (ref 70–99)
Glucose-Capillary: 138 mg/dL — ABNORMAL HIGH (ref 70–99)

## 2010-11-18 LAB — CBC
MCHC: 33.7 g/dL (ref 30.0–36.0)
RDW: 13.3 % (ref 11.5–15.5)

## 2010-11-18 LAB — ETHANOL: Alcohol, Ethyl (B): <5 mg/dL (ref 0–10)

## 2010-11-18 LAB — CARBAMAZEPINE LEVEL, TOTAL: Carbamazepine Lvl: 3.9 ug/mL — ABNORMAL LOW (ref 4.0–12.0)

## 2011-01-12 NOTE — Discharge Summary (Signed)
NAMESHIANE, [name]             ACCOUNT NO.:  [id number]   MEDICAL RECORD NO.:  [id number]          PATIENT TYPE:  INP   LOCATION:  1511                         FACILITY:  [facility]   PHYSICIAN:  Lonia Blood, M.D.       DATE OF BIRTH:  11/23/1960   DATE OF ADMISSION:  10/16/2007  DATE OF DISCHARGE:                               DISCHARGE SUMMARY   PRIMARY CARE PHYSICIAN:  Dr. Ala Dach.   DISCHARGE DIAGNOSES:  1. Severe left lower extremity cellulitis - improving.  2. Schizoaffective disorder.  3. Diabetes mellitus type 2.  4. Chronic obstructive pulmonary disease.  5. Hypotension.  6. Hypothyroidism.  7. Tobacco abuse.   DISCHARGE MEDICATIONS:  To be dictated at the time of the actual  discharge of the patient.   DISPOSITION:  To be dictated at the time of the actual discharge of the  patient.   PROCEDURES:  On October 22, 2007, the patient underwent CT scan of the  left lower extremity with findings of extensive cellulitis and no focal  abscess.   CONSULTATIONS:  Infectious disease consultation was obtained.   HISTORY AND PHYSICAL:  Refer to the dictated H and P done by Dr. Flonnie Overman  on October 16, 2007.   HOSPITAL COURSE:  1. Left lower extremity cellulitis.  [name] was admitted to acute      care at [facility] and started on intravenous vancomycin.      She continued to have severe cellulitis which was progressing up      her thigh.  For that reason on October 18, 2007, we added      intravenous imipenem.  On October 21, 2007, the patient was seen      in consultation by Dr. Daiva Eves from infectious diseases.  He      switched her antibiotics to Flagyl, ceftazidime and continued the      vancomycin.  He also suggested a CT scan of the leg.  The patient's      leg was scanned and no abscess was found.  Dr. Orvan Falconer from      infectious diseases came on October 23, 2007, and discontinued the      Flagyl.  He later came on October 24, 2007, and discontinued  the      ceftazidime.  [name] has continued to improve and she has      remained stable without any signs of sepsis or hemodynamic      instability.  She is currently on vancomycin intravenously and      there is a plan to switch her to oral antibiotics in 24-48 hours.  2. Diabetes mellitus type 2.  This has been controlled throughout this      admission on the patient's regular diabetic      medications mainly included a sliding scale insulin with each meal.  3. Schizoaffective disorder.  [name] was continued on her high      doses of Risperdal and she also received her Risperdal Consta      injection on October 23, 2007, intramuscularly.  Lonia Blood, M.D.  Electronically Signed     SL/MEDQ  D:  10/24/2007  T:  10/25/2007  Job:  [id number]

## 2011-01-12 NOTE — Discharge Summary (Signed)
[name], [name]             ACCOUNT NO.:  [id number]   MEDICAL RECORD NO.:  [id number]          PATIENT TYPE:  INP   LOCATION:  5024                         FACILITY:  [facility]   PHYSICIAN:  [name], Anne MurrayDATE OF BIRTH:  [birth year]   DATE OF ADMISSION:  02/03/2007  DATE OF DISCHARGE:  02/04/2007                               DISCHARGE SUMMARY   PRIMARY CARE PHYSICIAN:  Unassigned/Dr. Darcus Austin?   DISCHARGE ACTIVITY:  No restrictions.   DISCHARGE DIET:  No restrictions.   DISCHARGE DIAGNOSES:  1. Cellulitis.  2. Hypothyroidism.  3. Schizophrenia/bipolar/generalized anxiety disorder.  4. Diet controlled diabetes mellitus.  5. Gastroesophageal reflux disease.  6. Constipation.  7. History of hypertension, currently blood pressure is in the normal      range.   DISCHARGE MEDICATIONS:  1. Klonopin 0.5 mg twice daily and 1 mg q.h.s.  2. Tegretol XR 200 mg every morning and 400 mg at night.  3. Flovent 220 mcg 2 puffs daily.  4. Risperdal 4 mg at bedtime.  5. Synthroid 75 mcg daily.  6. Protonix 40 mg daily.  7. Colace 100 mg twice daily.  8. Bactrim DS 2 tabs twice daily for 21 days.   LABORATORY RESULTS:  Hemoglobin A1c was 6.1 at the time of discharge.  TSH was 2.3 at discharge.  Tegretol level was 4.4.  Blood cultures were  pending.  Urine drug screen was negative.  Alcohol level was 0/less than  5.  Hemoglobin 12.2, the rest of CBC was within normal limits.  Creatinine was 1.3.   PROCEDURE:  None.   FOLLOWUP:  The patient will follow up with PACT team as scheduled for  psychiatric issues.  The patient will also call for an appointment with  Dr. Darcus Austin this week or call [phone] and ask for a physician referral to  establish a new primary care physician and be seen in the next week for  followup of her cellulitis.   HOSPITAL COURSE:  [age]-year-old white female sent over from  urgent care she had suicidal ideations there.  She was involuntarily  committed into the emergency department.  Her initial recent for visit  to the urgent care was for a cellulitis.  Cellulitis is mild, it is  located in her right lower extremity.  She was started on Bactrim DS 2  tabs twice daily for a possible MRSA cellulitis.  She tolerated this  medication well.  We obtained a psychiatric consult the day after he  admission, who evaluated her and deemed her to not be suicidal and to be  ready for discharge.  Since she was never started on any IV antibiotics  or had any other issues for inpatient admission, she was discharged on  February 04, 2007 back to her assisted living facility.   1. Anxiety:  Patient had no change in her Klonopin dose 0.5 mg twice      daily and 1 mg q.h.s.  2. Bipolar:  Patient continues her Tegretol at 200 mg every morning      and 400 mg at night.  3. COPD:  Patient will continue her Flovent  220 mcg 2 puffs daily.  4. Schizophrenia:  Patient will continue Risperdal 4 mg at bedtime.  5. Hypothyroidism:  Patient will continue Synthroid 75 mcg daily.  TSH      was normal here in the hospital.  6. Gastroesophageal reflux disease/constipation:  Patient will      continue outpatient meds, including Protonix 40 mg daily and Colace      100 mg twice daily.  7. Cellulitis:  Patient was started on Bactrim DS 2 tabs twice daily      for 21 days and a prescription was given.  She tolerated this      medication well in the hospital.  She should follow up with primary      care via Dr. Darcus Austin or a new primary care physician in the next week      to confirm resolution of her cellulitis.      Angeline Slim, M.D.    ______________________________  [name], M.D.    AL/MEDQ  D:  02/04/2007  T:  02/04/2007  Job:  [id number]   cc:   Despina Hick

## 2011-01-12 NOTE — Discharge Summary (Signed)
[name], [name]             ACCOUNT NO.:  [id number]   MEDICAL RECORD NO.:  [id number]           PATIENT TYPE:   LOCATION:                                 FACILITY:   PHYSICIAN:  Santiago Bumpers. Hensel, M.D.DATE OF BIRTH:  01-May-1961   DATE OF ADMISSION:  DATE OF DISCHARGE:                               DISCHARGE SUMMARY   ADDENDUM:  Due to logistical issues, the patient was unable to be  discharged on February 04, 2007.  Her previous discharge summary is  completely accurate and up to date except for her true date of discharge  now is February 05, 2007.      Angeline Slim, M.D.    ______________________________  Santiago Bumpers. Leveda Anna, M.D.    AL/MEDQ  D:  02/05/2007  T:  02/05/2007  Job:  [id number]
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
## 2011-01-12 NOTE — H&P (Signed)
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 Reino Bellis, her primary care doctor, for Chief Complaint of left leg being  swollen and red.  There is no other history that can be found per  record.  Patient is not a good historian secondary to baselines mental  status.   PAST MEDICAL HISTORY:  The patient has a pretty extensive Past Medical  History including:  1. COPD.  2. Hypertension.  3. Bipolar disorder.  4. Diabetes, type 2.  5. Gastroesophageal reflux disease.  6. Hypothyroidism.  7. Schizophrenia.  8. Personality disorder.  Cannot take care of herself, live in a group      home.   REVIEW OF SYSTEMS:  Otherwise, the Review of Systems is pretty much  negative.  She denies any chest pain, shortness of breath, orthopnea.  She denies weight gain or lower extremity edema.  She denies recent  travel.   FAMILY HISTORY:  Noncontributory.   PAST SURGICAL HISTORY:  Not known.   MEDICATIONS AT HOME:  1. Clotrimazole cream 1% topically b.i.d.  2. Flovent 220 mcg inhaled 2 puffs every day.  3. Hydrochlorothiazide 25 mg p.o. daily.  4. Levothyroxine 75 mcg p.o. daily.  5. Clonazepam 2 mg 1 tablet orally every night at bedtime.  6. Tegretol XR 200 mg 2 tablets in the morning and Tegretol XL 200 mg      3 tablets every night.  7. Risperdal 2 mg 1 tablet at 3:00 p.m. and 4 mg nightly.  8. She also gets a Risperdal injection 50 mg intramuscularly every 2      weeks.  9. Sliding scale insulin.   PHYSICAL EXAMINATION:  GENERAL:  The patient is an  unkempt 50 year old  female.  She is obviously speaking incomprehensible words.  Apparently  this is her baseline mental status per the emergency room physician who  has seen her in the past.  VITAL SIGNS:  Blood pressure 110/62, respirations 16, pulse 95,  temperature 99.2.  HEENT:  Normocephalic and atraumatic.  Sclerae nonicteric.  PERRLA.  Extraocular muscles intact.  NECK:  Supple.  No JVD, no carotid bruits.  CARDIOVASCULAR:  S1, S2.  Regular rate and rhythm.  No murmurs, rubs,  clicks.  ABDOMEN:  Soft, nontender, nondistended.  Positive bowel sounds.  LUNGS:  Clear to auscultation bilaterally.  No rhonchi, rales, or  wheezes.  NEUROLOGIC:  Cannot be fully assessed.  EXTREMITIES:  Show left lower extremity to be erythematous, hot,  swollen, tender to touch.  SKIN:  There is no open drainage.   LABORATORY DATA:  White count 7.8, hemoglobin  12.3, hematocrit 36.5,  platelets 141.  Sodium 138, potassium 3.3, chloride 98, CO2 32, glucose  128, BUN 27, creatinine 1.16.   ASSESSMENT AND PLAN:  The patient is a 50 year old female with:  1. Lower extremity cellulitis and erythema, edema, swelling.  Will go      ahead and admit the patient to medical unit, start vancomycin and      Zosyn per pharmacy protocol.  We will go ahead and get lower      extremity Dopplers to rule out deep vein thrombosis, elevate legs,      control pain with Tylenol.  2. Type 2 diabetes. Will check a hemoglobin A1c, start sliding scale      insulin and Lantus.  3. Hypothyroidism.  Check TSH and continue Synthroid at 75 mcg per      day.  4. Chronic obstructive pulmonary disease per records.  Not on home      oxygen per records.  Will continue Flovent per nursing home regimen      at 220 mcg daily.  5. 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      Will continue Risperdal per nursing home regimen at 4 mg nightly      and Risperdal 2 mg q. 6 h p.r.n. agitation and will also continue       Klonopin 2 mg p.o. nightly and continue Tegretol XR 400 mg in the      morning and 600 nightly.  If patient has a lengthy stay, in-regards      to #1 above, we may need to get psychiatry involved.  6. Hypertension.  It is controlled.  Will continued      hydrochlorothiazide 25 mg p.o. daily.  Watch renal function.  7. Deep vein thrombosis prophylaxis with Lovenox.  8. Gastrointestinal prophylaxis with Protonix.      Lucita Ferrara, MD  Electronically Signed     RR/MEDQ  D:  10/16/2007  T:  10/16/2007  Job:  [id number]

## 2011-01-15 NOTE — Consult Note (Signed)
NAME:  [name], [name]                      ACCOUNT NO.:  [id number]   MEDICAL RECORD NO.:  [id number]                   PATIENT TYPE:  REC   LOCATION:                                       FACILITY:  [facility]   PHYSICIAN:  Jonelle Sports. Sevier, M.D.              DATE OF BIRTH:  1960/11/21   DATE OF CONSULTATION:  04/18/2003  DATE OF DISCHARGE:                                   CONSULTATION   CONSULTATION   HISTORY:  This 50 year old white female comes self-referred for assistance  with management of stubborn calluses on her feet.   The patient has a complex medical history which includes type 2 diabetes for  which she said a recent A1c was 5.9%. She also has asthma, hypothyroidism,  bipolar disorder, and several other lesser medical problems. For some time,  she has been troubled by heavy calluses on pressure points on both feet, and  these have tended to become quickly quite hyperkeratotic. Accordingly, she  has been treated with 40% urea lotion with some success. Recently, however,  she has not had any professional attention to her feet, and these calluses  have gotten quite heavy and somewhat tender. She reports today for our  evaluation and advice.   PAST MEDICAL HISTORY:  Essentially as reported in the present illness.   ALLERGIES:  She has allergies to TETRACYCLINE, GLUCOTROL, DITROPAN, DETROL.   MEDICATIONS:  1. Lithium.  2. Risperdal.  3. Glucophage.  4. Avandia.  5. Protonix.  6. Synthroid.  7. Albuterol and Flovent inhalers.   PHYSICAL EXAMINATION:  Examination today is limited to the distal lower  extremities. The patient's legs and feet are both chronically edematous but  with no ulcerations or weeping on that basis. Skin temperatures are normal  and symmetrical. Pulses were everywhere palpable and adequate. Monofilament  testing shows that she has retained protective sensations throughout both  feet.   She has very heavy hyperkeratotic callous formation on  the medial aspect of  the right hallux bilaterally at the first metatarsal head areas on the  plantar aspect of the feet. This with considerable fissuring on the right.  She also has callous formation underlying the fifth metatarsal head on the  plantar aspect of the left foot and heavy callous formation at both heels,  greater on the left than the right, again with fissuring on the left.   DISPOSITION:  1. The patient is given instructions regarding foot care and diabetes via     video with nurse reinforcement.  2. The aforementioned calluses are reduced by sharp tearing and Dremel in     combination by Dr. Carmin Muskrat with my supervision. This is well tolerated     and without complication.  3. The patient is advised to begin to use bag balm on these areas on a twice     daily basis.  4. She is a given a prescription for removal of  custom inserts for her     shoes. Actually, the inserts she has had previously are not custom made,     and she in fact needs that because of her diabetes and her pre-ulcerative     calluses.  5. Followup visit here will be in four weeks.                                               Jonelle Sports. Cheryll Cockayne, M.D.    RES/MEDQ  D:  04/18/2003  T:  04/19/2003  Job:  [id number]   cc:   Madaline Guthrie, M.D.  [address] [place]  [place] [address]  Fax: [phone]

## 2011-01-15 NOTE — Consult Note (Signed)
[place]. [facility]  Patient:    [name], [name]                    MRN: [id number] Proc. Date: 03/05/01 Adm. Date:  [id number] Attending:  Alfonso Ramus                          Consultation Report  DATE OF BIRTH:  1960-11-29  REFERRING PHYSICIAN:  Karlene Einstein, M.D.  CONSULTANT:  Netta Cedars, M.D.  INTRODUCTION:  Consultation was called by Dr. Lillia Carmel in order to evaluate the patient with long standing history of bipolar illness who was admitted to the medical floor because of symptoms of polyuria.  PRESENTING PROBLEMS:  The patient is a 50 year old white, divorced female who was admitted to [facility] on March 03, 2001 secondary to chest pain. While worked up, she was found to be hypernatremic.  The patient admitted drinking a lot fluids in order to cleanse her system.  She reports that she is not sleeping well for several weeks, feeling like her thoughts are racing. She is having a hard time to think straight and concentrate.  Also she admitted getting easily irritable, argumentative suspecting that doctors are making medical experiments on her and taking advantage of her.  She did not believe in this on delusional level but took under consideration that maybe doctors simply wanted to kill her.  Today she reported feeling better and the sister that was available with the patient felt that she is doing better.  The same was confirmed by the patients nurses.  The patient suffers from bipolar illness for over 20 years.  Last hospitalization about three years ago after [facility]. The patient at home was on lithium carbonate 600 mg in the morning and 800 mg at night.  She was also on Seroquel 200 mg at bedtime.  The patient has history of both manic and depressive episodes and history of suicidal behavior.  Her other medical conditions included non-insulin dependent diabetes mellitus, COPD, obesity,  hypothyroid disease.  During the workup today, the patient had electrolytes normal with slight elevation of TSH pointing towards hypothyroidism.  Lithium level on March 03, 2001 was 0.78.  The patient is allergic to TETRACYCLINE and DITROPAN.  SUBSTANCE ABUSE:  The patient does not drink at present but she drank excessively until her mid and late 30s.  LAST HOSPITALIZATION:  Took place over three years ago at the [facility].  MENTAL STATUS EXAMINATION:  Obese, white female, looked much older than her current age.  At this point denies hallucinations but admitted yesterday hearing voices asking her questions.  Mood elated.  Affect was on high gear and labile.  The patients laughs for now reason, talks fast at time.  Speech is pressured.  Flight of ideas, circumstantial, at times tangential.  No dangerous ideas were reported.  Paranoid thoughts, close delusion in the risk factor for poisoning.  Yet she was able to rationally give coherent conversation and no ideas of reference.  Intentions seem to be normal.  Alert and oriented times three.  Concentration was slightly decreased.  Insight was partial.  Judgment was impaired yet the patient was agreeable to come to mental health for her medical care.  IMPRESSION:   AXIS I:  1. Bipolar disorder, manic phase.            2. Alcohol dependence in remission.  AXIS  II:  Deferred. AXIS III:  1. Non-insulin dependent diabetes mellitus.            2. Chronic obstructive pulmonary disease.            3. Hyponatremia.            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  AXIS IV:  Moderate stressors.   AXIS V:  Global assessment of functioning at present, maximum past            year 63.  PLAN:  The patient may need to go off lithium.  In the meantime will try to add Depakote.  Will start Depakote as soon as feasible. Once medically cleared, the patient will  be admitted to inpatient treatment at [facility].  At this point, the patient does not want me to adjust her medications, means new medication or changing the dose of medications.  Will add a low dose of Haldol 1 mg three times a day to help with hallucinatory thoughts.  Thank you very much for this consultation.  Further report will be dictated. Once ready for transfer, she could be transferred with the hope of obtaining good behavior as inpatient. DD:  03/05/01 TD:  03/05/01 Job: [id number]

## 2011-01-15 NOTE — Op Note (Signed)
[name], [name]             ACCOUNT NO.:  [id number]   MEDICAL RECORD NO.:  [id number]          PATIENT TYPE:  INP   LOCATION:  4737                         FACILITY:  [facility]   PHYSICIAN:  Anselm Pancoast. Weatherly, M.D.DATE OF BIRTH:  06-29-1961   DATE OF PROCEDURE:  03/13/2005  DATE OF DISCHARGE:                                 OPERATIVE REPORT   PREOPERATIVE DIAGNOSES:  1.  Cellulitis of callus first and fifth metatarsal head area.  2.  Diabetes mellitus.  3.  Venous stasis disease left lower extremity.   POSTOPERATIVE DIAGNOSES:  1.  Cellulitis of callus first and fifth metatarsal head area.  2.  Diabetes mellitus.  3.  Venous stasis disease left lower extremity.   OPERATION:  Debridement of superficial cellulitis and culture deep tissue  first metatarsal head area, debridement of callus fifth metatarsal head  area.   ANESTHESIA:  General.   HISTORY:  [name] is a 50 year old about 260 pound diabetic whose  had a previous orthopedic repair of her fractured fibula plating  years ago  and has had problems with chronic venous stasis since then. She has not  really got basically anesthetic neuropathy at this time but probably has  most likely decreased sensation but has a callus on the first and fifth  metatarsal head areas. There is sort of the valgus deformity with pressure  on the head of predominantly the first MP joint space. She has been treated  with antibiotics and was admitted two days ago with IV antibiotics. She has  got this chronic cellulitis of the distal lower leg and edema that is kind  of a combination of her obesity and also the old orthopedic injury. Doppler  venous studies shows no evidence of deep venous thrombophlebitis and I was  asked to see the patient for debridement of the toe. She has got kind of a  subnail chronic infection that is so common in the diabetics but really not  a toe infection, but this is heavy callus with kind of fluctuant  fluid under  the callus. I think this is more of a weightbearing issue problem than  actually truly an infection and had Dr. Lajoyce Corners sort of examine the patient  basically after she was anesthetized for his opinion. The callus __________  was kind of elevated off. There are two ulcerations full thickness going  down over the first MP joint area. A hemostat you can drop it on down to the  actual joint but there is not an obvious abscess within the joint at this  time. The fifth metatarsal area had a large callus. This was peeled off and  similarly debrided. It does not appear to be as much of an infection or  penetration down to the actual joint but this is the pressure bearing areas  of her foot. There was basically very little other debridement. We cultured  aerobic and anaerobically. We are satisfied that there is not a frank  abscess down at the first joint space at this time and then placed a  Betadine soaked gauze, 4 x 4 and a Kerlix and  then wrapped her with coban  dressing. We need to keep the leg elevated as much as possible and try to  prevent her from swelling with the Una boots or Ace wraps and elevation if  possible. I fear that this is going to progress into an actual joint  infection but will try to manage conservatively at this time with wound  care,  etc. Pulses appear adequate and the short procedure was completed and she  was sent to the recovery room in a satisfactory postop condition. We will  continue the antibiotics, await the cultures and start soak and wound care  tomorrow and either an [place] book or Ace wraps according to what appearance is  tomorrow.       WJW/MEDQ  D:  03/13/2005  T:  03/13/2005  Job:  [id number]

## 2011-01-15 NOTE — Op Note (Signed)
[place]. [facility]  Patient:    [name], FARNER Visit Number: [id number] MRN: [id number]          Service Type: DSU Location: [facility] Attending Physician:  Charlton Haws Dictated by:   Currie Paris, M.D. Proc. Date: 07/25/01 Admit Date:  08/24/2001   CC:         [facility]  Dr. Sharl Ma   Operative Report  CCS# [id number]  PREOPERATIVE DIAGNOSIS:  Right breast mass, suspicious by mammography for cancer but not diagnostic on core biopsy.  POSTOPERATIVE DIAGNOSIS:  Right breast mass, suspicious by mammography for cancer but not diagnostic on core biopsy.  OPERATION PERFORMED:  Needle guided excision of right breast mass.  SURGEON:  Currie Paris, M.D.  ANESTHESIA:  General.  INDICATIONS FOR PROCEDURE:  The patient is a 50 year old who has right breast mass which was suspicious looking on mammography although a core biopsy did not show cancer.  Because of this discordance, we elected to proceed with a needle guided excision.  The patient has had some chronic skin problems and was seen preoperatively and treated with some antibiotics local therapy for that and that has improved.  DESCRIPTION OF PROCEDURE:  The patient had a guide wire placed in the breast center.  She was seen in the holding area and had no further questions.  The patient was taken to the operating room and after satisfactory general endotracheal anesthesia had been obtained, the right breast was prepped and draped.  The area in question was marked prior to starting by the radiologist and the guide wire entered inferior to that.  I made a transverse incision which was almost in the direct 9 oclock position.  I divided a little subcutaneous tissues and elevated the skin so I could manipulate the guide wire into the biopsy site.  I then took a wide biopsy around the needle which was traveling from inferior to superior but I was able to get  completely around it.  There was a fairly hard area in the midst of what I too out and I got at one point a little close to it, backed up and took more tissue.  Bleeders were controlled with coagulation.  Once everything was dry, I put some clips in in case we needed to do some subsequent marking or radiology.  The wound was irrigated and infiltrated with some Marcaine and then closed in layers with 3-0 Vicryl followed by 4-0 Monocryl subcuticular and Steri-Strips.  The patient tolerated the procedure well.  There were no operative complications.  All counts were correct.  The radiologist reported that the specimen contained the area in question. Dictated by:   Currie Paris, M.D. Attending Physician:  Charlton Haws DD:  08/24/01 TD:  08/24/01 Job: [id number]

## 2011-01-15 NOTE — Discharge Summary (Signed)
[name], [name]             ACCOUNT NO.:  [id number]   MEDICAL RECORD NO.:  [id number]          PATIENT TYPE:  INP   LOCATION:  4737                         FACILITY:  [facility]   PHYSICIAN:  Madaline Guthrie, M.D.    DATE OF BIRTH:  24-Jul-1961   DATE OF ADMISSION:  03/11/2005  DATE OF DISCHARGE:                                 DISCHARGE SUMMARY   DISCHARGE DIAGNOSES:  1.  Cellulitis / abscess of left foot .  2.  Lithium toxicity.  3.  Type 2 diabetes mellitus.  4.  Bipolar/schizophrenia disorder.  5.  Chronic obstructive pulmonary disease.  6.  Venostasis disease of lower extremity.  7.  Recurrent folliculitis with furuncles over abdomin, groin, submammary,      and axillary regions.  8.  Tobacco abuse.  9.  Urinary incontinence   DISCHARGE MEDICATIONS:  1.  Clindamycin 300 mg p.o. q.i.d.  2.  Fluticasone (Flovent) two puffs q.12h.  3.  Sliding scale with NovoLog insulin.  Dose range is 1 to 10 units subcu      t.i.d. a.c.  4.  Xopenex nebulizer 0.63 mg inhalation q.6h  5.  Levothyroxine 75 mcg p.o. daily.  6.  Lithium carbonate 600 mg p.o. q.h.s.  7.  Protonix 400 mg p.o. daily w.c.  8.  Risperidone 4 mg p.o. q.h.s.  9.  Senokot two tablets p.o. daily.  10. Tylenol 650 mg p.o. q.6h. p.r.n. for pain.  11. Lortab one tablet p.o. q.4h. p.r.n. for pain.  12. Glucophage 500 mg p.o. b.i.d.   DISPOSITION AND FOLLOW-UP:  [name] is to be discharged to a skilled  nursing facility or assisted living facility as a result of her evaluation  and treatment by general surgery where she underwent an I&D of soft tissue  near her left first metatarsal head region of her foot.  She will need at  least two to three weeks of skilled wound care.  She is able to ambulate and  take care of her ADLs at her baseline functioning.  The need for a skilled  nursing facility is to prevent further spread of the infection and to assure  compliance with a wound care regimen.  We anticipate that she  will resume  her medical care after this short period of her stay at a skilled nursing  facility with the [facility] outpatient clinic.  Pending her progress, she may need to be seen in an outpatient wound care  clinic.  This would be for following of any existing wound care that may be  needed and her chronic  skin irritation.  She will need to complete 10 days  of clindamycin therapy for her wound infection as of the date of this note.   CONSULTATION:  Dr. Anselm Pancoast. Weatherly, general surgery, was consulted and  he performed a debridement of her superficial cellulitis of her first  metatarsal head area, and debridement of a callus on her fifth metatarsal  head area.   PROCEDURES:  1.  I&D left foot near great toe.  2.  Bilateral lower extremity Dopplers.  These Dopplers  showed patent      femoral veins and popliteal veins, even though it was a technically      difficult study due to the patient's body habitus and lack of      cooperation.  3.  Chest x-ray on March 11, 2005, showed stable mild cardiomegaly,      peribronchial thickening, no evidence of pneumonia.   HISTORY OF PRESENT ILLNESS:  This is a 50 year old white female with a past  history significant for bipolar schizophrenia, chronic obstructive lung  disease, type 2 diabetes mellitus, hypothyroidism, obesity, and ongoing  tobacco abuse. One week prior admission to the hospital, she had been seen  by her mental health Bunnie Lederman who measured a lithium level of over 2.0.  She  was taken off the lithium at that time for a total of one week in which she  was to present in the clinic for follow-up and lithium level check.  On  presentation to the clinic, a lithium level was drawn and found to be 1.9,  supratherapeutic.  Her mental status at the time was her baseline.  She  complained of left lower extremity pain.  She stated in the clinic that she  often had boils that were painful but would disappear.  On  this occasion,  the sore on her foot had gotten worse and was making it increasingly more  difficulty for her to walk.  She had noticed increased left lower extremity  swelling, redness, and pain.  She was accompany by a part-time Probation officer who verified the increasing left lower extremity symptoms.  The  patient denied any palpitations, chest pain or progressive shortness of  breath, or any additional changes from her baseline function.   ALLERGIES:  1.  TETRACYCLINE.  2.  GLUCOTROL.  3.  DITROPAN.  4.  SULFA DRUGS.  5.  DOXYCYCLINE.   PAST MEDICAL HISTORY:   MEDICATIONS PRIOR TO ADMISSION:   SOCIAL HISTORY:  She is a current three-pack-per-day smoker for 30+ years.  She denies the use of alcohol.  She is positive for caffeine intake,  drinking two to three liters of diet Pepsi per day.  She is divorced.  Her  education background is a degree in accounting.  She has a CNA caregiver  that provides help with her ADLs three hours per day.  She has a significant  other who provides support at home.   FAMILY HISTORY:  Significant for diabetes, CAD, and colon cancer.   PHYSICAL EXAMINATION:  GENERAL APPEARANCE:  Mrs. [name] is an obese, poorly  groomed white female.  She appears to be in no acute distress.  VITAL SIGNS:  Admission vital signs were pulse 98, blood pressure 106/77,  temperature 99.5, respirations 22, O2 saturations were 96% on two liters of  O2 nasal cannula.  HEENT:  Pupils are equal, round and reactive to light.  Extraocular muscles  are intact.  She has poor dentition.  Increased oral secretions, but no  exudates evident in her oropharynx.  NECK:  She has no JVD, no lymphadenopathy.  RESPIRATORY:  She is positive for bilateral wheezes, coarse chest sounds  throughout and she was making a good effort in moving air appropriately.  CARDIOVASCULAR:  She has regular rate and rhythm, no murmurs, rubs, or  gallops. ABDOMEN:  Her abdomen was soft, mildly tender,  was unable to palpate any  organomegaly due to obesity.  She does have a large umbilical hernia.  EXTREMITIES:  There are 2+ pulses bilaterally.  Positive for left leg edema  with 2+ pitting asymmetry.  Her left foot and lower leg mid tibial region is  red, warm and painful to touch.  The redness is circumferential and does not  have a very well defined border around the anterior aspect of the lower leg.  SKIN:  She has multiple skin furuncles in her groin, her posterior upper  thigh, under each breast bilaterally, the boils have a central area of  erythema surrounded by ecchymosis and some are pustulant.  Some of these  furuncles show signs of healing and subsequent scarring.  The great toe on  her left foot has a large 2 cm ulceration covered with dead skin, callous  material.  There are no eschars and the wound does not appear to have any  drainage or purulent material.  NEUROLOGIC:  She appears to be alert and oriented x4.  She does have  decreased peripheral sensation on the plantar surface of her foot.  She  makes poor eye contact on questioning.  Her mood appears to be stable at  this time.  She is cooperative.  She has good cognition and no evidence of  any memory loss.   ADMISSION LABORATORY DATA:  CBC with hemoglobin 16.4, hematocrit 51, wbc  9.6, platelet count 206, MCV 101.5, RDW 14.9.  Chemistries with sodium 134,  potassium 4.4, chloride 95, bicarb 32, glucose 113, BUN 15, creatinine 1.1.  Liver function tests with bilirubin 0.6, alkaline phosphatase 130, AST 23,  ALT 11, total protein 6.9, albumin 3.3, calcium 10.3.  Her lithium level on  admission was 1.63.  Her TSH on admission was 3.14. Cultures from her wound  are positive for MRSA, Proteus.   HOSPITAL COURSE:  PROBLEM #1 -  LEFT LOWER EXTREMITY CELLULITIS:  On  admission, patient was started on vancomycin and Zosyn for  broad spectrum  coverage of her cellulitis.  Blood cultures were ordered and they were  negative.   Wound cultures were positive for Proteus, MRSA infection.  Surgery was consulted and did an I&D of her great left toe and first and  fifth metatarsal pads.  The wound is healing well.  It is covered in gauze  wrap.  Imaging studies of the left lower extremity showed no acute  osteolytic process.  Bilateral leg Dopplers showed patent vasculature.  Clinically  her cellulitis has improved and she no longer needs to be on IV  antibiotics.  She has been changed to p.o. clindamycin which is sensitive to  growth on her wound culture.   PROBLEM #2 -  LITHIUM TOXICITY:  The patient's lithium level upon admission  was elevated and this was coupled together with her physical appearance at  presentation which she had excessive drooling from her mouth and slow  mentation, although this did not appear to be different than her baseline  functioning.  EKG on admission was normal.  While admitted, she did have an episode on March 16, 2005, of a brief run of bradycardia with a secondary  type I heart block.  Her heart rate was 22.  She was, at the time,  asymptomatic.  She denied any chest pain or palpitations.  This episode  lasted less than a few seconds.  On March 15, 2005, her lithium level was  0.98.  At this time, her lithium was restarted at 300 mg and increased to  600 mg daily currently at discharge.  She should continue with her 600 mg  lithium dosing but a  close watch has to be maintained for toxicity and  possible cardiac side effects of this medication.   PROBLEM #3 -  TYPE 2 DIABETES MELLITUS:  The patient, on admission, has been  on a NovoLog sliding scale insulin.  She takes metformin 500 mg in addition  to her insulin as a home medication.  Her last hemoglobin A1c showed good  control of her type 2 diabetes and was 5.5.   PROBLEM #4 -  BIPOLAR SCHIZOPHRENIA:  We will continue her on her Risperdal  4 mg p.o. q.h.s. and closely monitor for any extrapyramidal side effects  that may lead to an  exacerbation of her current health condition.   PROBLEM #5 -  CHRONIC OBSTRUCTIVE PULMONARY DISEASE:  It was noted on  admission that her O2 saturations were between 89 and 92% on room air.  She  continues to abuse tobacco and is a heavy smoker with no intention of  quitting.  She is on albuterol nebs q.4h. as needed and should continue  those at home.  In addition to the albuterol treatments for bronchodilation,  she is being well maintained on Flovent.   Upon discharge today, the patient will go to a skilled nursing facility  where she can receive a brief period of wound care so that she may return to  her home environment.   Addendum: 1 wound culture with proteus sp. S to Cephelexin. 10 days of po  Keflex added to discharge regimen      Chapman Fitch, MD      Madaline Guthrie, M.D.  Electronically Signed    IO/MEDQ  D:  03/17/2005  T:  03/17/2005  Job:  [id number]

## 2011-01-15 NOTE — H&P (Signed)
[facility]  Patient:    [name], [name]                    MRN: [id number] Adm. Date:  [id number] Disc. Date: [id number] Attending:  Lorre Nick Dictator:   Candi Leash. Theressa Stamps, N.P.                   Psychiatric Admission Assessment  PATIENT IDENTIFICATION:  This is a 50 year old divorced white female voluntarily admitted for depression and psychosis.  HISTORY OF PRESENT ILLNESS:  The patient presents with a history of depression, bipolar disorder with psychotic behavior.  The patient was recently discharged from [name] about a week ago.  She reports there was a change in medications.  She also reports some delusions, feeling like she "is the angel of death and the angel of light" with positive paranoid feelings, feeling like people are following her.  Her sleep has been decreased as low as a couple of hours up to seven hours.  Her appetite is decreased.  She reports an intentional 52 pound weight loss to control her diabetes.  She identifies no specific triggers in regard to this depression.  She denies any suicidal or homicidal ideation at present.  The patient does contract for safety.  PAST PSYCHIATRIC HISTORY:  She sees Dr. [name] at [name] on an outpatient basis; also [name] who is a therapist and had a recent admission to [name] two weeks ago.  SUBSTANCE ABUSE HISTORY:  She smokes.  She has been sober for three years. 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  She denies any substance abuse.  PAST MEDICAL HISTORY:  Primary care [name] is Dr. Wyonia Hough in [name].  Medical problems: Type 2 diabetes, asthma, hypothyroidism, and also "recent blood in stool."  MEDICATIONS: 1. Lithium carbonate 300 mg two p.o. b.i.d. 2. Lasix 40 mg q.d. 3. Multivitamin. 4. Protonix 40 mg every day. 5. Synthroid 0.1 mg q.d. 6. Risperdal 4 mg every day.  DRUG ALLERGIES:  TETRACYCLINE,  DITROPAN, DETROL, GLUCOTROL.  PHYSICAL EXAMINATION:  Performed at [name].  LABORATORY DATA:  The patient reports a recent viral infection.  Her white count was elevated mildly at 10.6, H&H 11.6 and 33.8.  Lithium was 0.84. Urine pregnancy test was negative.  Urine drug screen was negative.  SOCIAL HISTORY:  She is a 50 year old divorced white female, no children.  She lives with her [name].  She is disabled.  She has no legal problems.  She has completed high school.  FAMILY HISTORY:  She has a brother who is prison for murder.  MENTAL STATUS EXAMINATION:  She is an alert, overweight, unkempt Caucasian female, cooperative, good eye contact.  She appears older than her stated age. Speech is rapid but relevant.  Mood is anxious.  Affect is mildly irritable. Thought processes: Positive paranoia, positive delusions, no suicidal or homicidal ideations, no auditory hallucinations, no current visual hallucinations, no flight of ideas.  Cognitive functioning is intact.  Memory is good.  Judgment is impaired.  Insight is fair.  ADMISSION DIAGNOSES: Axis I:    Bipolar disorder, depressed with psychosis. Axis II:   Deferred. Axis III:  1. Type 2 diabetes.            2. Hypothyroidism.            3. Asthma. Axis IV:   Problems related to social environment, other psychosocial  problems            related to her psychiatric illness, and medical problems. Axis V:    Current is 35, estimated this past year is 60-65.  INITIAL PLAN OF CARE:  Voluntary admission for depression and psychosis. Contract for safety.  Check every 15 minutes.  The patient agrees to be safe. Will resume her routine medications.  Will obtain further labs.  Our goal is to return the patient to her prior living arrangements, to be medication compliant, to decrease or alleviate her psychotic symptoms so the patient can be safe and functional, and to follow up with Dr.  [name].  ESTIMATED LENGTH OF STAY:  Four to five days. DD:  04/05/01 TD:  04/05/01 Job: [id number]

## 2011-01-15 NOTE — Consult Note (Signed)
NAMECHERITA, HEBEL             ACCOUNT NO.:  [id number]   MEDICAL RECORD NO.:  [id number]          PATIENT TYPE:  INP   LOCATION:  4737                         FACILITY:  [facility]   PHYSICIAN:  Anselm Pancoast. Weatherly, M.D.DATE OF BIRTH:  16-Jul-1961   DATE OF CONSULTATION:  03/12/2005  DATE OF DISCHARGE:                                   CONSULTATION   CHIEF COMPLAINT:  Infected left foot.   HISTORY OF PRESENT ILLNESS:  Lyndsay Talamante is a 50 year old diabetic  overweight who has had a chronically kind of draining area on the left foot  over the first and fifth metatarsal head areas that has now got secondary  surrounding cellulitis and purulence under a callus going across the base of  the toe and in the first interdigital space.  The patient has had an old  injury years ago to the ankle that required plating and has chronic  stasis  dermatitis and is a diabetic.  She still appears to have sensation to the  foot, but I think that this is a pressure generated problem and will be  difficult to get healing unless the weightbearing can be prevented over the  next few weeks.  There is certainly not an obvious ischemic element of the  first or second toe at this time, but I fear that we are going to have an  infection going into the interdigital web space.  The fifth MP joint area is  a similar process of a large heavy thickened callus that appears to be a  penetrating ulcer also, but there is not as much surrounding infection on  that area.  I have discussed with her physician that I fear that this may  not heal with just soft tissue drainage, but he desires that we proceed at  this point and if we are not successful we will approach that issue at a  later time.  The patient presently has been admitted.  The wound cultured  and growing gram positive rods and gram negative rods, plus gram positive  cocci and she is presently on insulin and also on Lovenox.  Foot x-ray has  definitely not  known osteomyelitis in even the first or fourth metatarsal  areas, but there is marked swelling down in the first MP area.  The patient  has agreed to debridement of this area.  I think that it is going to take  not a local anesthesia, since it is such that it is certainly not anesthetic  and I do not think any type of local can be infiltrated that would not  spread the infection and make the following of the penetrating ulcers very  difficult.  I have added her to the OR schedule for Saturday morning.  The  patient understands the planned procedure and will be kept NPO tonight after  midnight.  We will need an EKG and hopefully can have her on a schedule  about 8:30 Saturday morning.       WJW/MEDQ  D:  03/12/2005  T:  03/13/2005  Job:  [id number]

## 2011-01-15 NOTE — Discharge Summary (Signed)
[name], [name]                       ACCOUNT NO.:  [id number]   MEDICAL RECORD NO.:  [id number]                   PATIENT TYPE:  INP   LOCATION:  5511                                 FACILITY:  [facility]   PHYSICIAN:  C. Ulyess Mort, M.D.             DATE OF BIRTH:  [birth year]   DATE OF ADMISSION:  03/31/2004  DATE OF DISCHARGE:  04/01/2004                                 DISCHARGE SUMMARY   DISCHARGE DIAGNOSES:  1. Cellulitis of the right lower extremity.  2. Chronic venous insufficiency of both lower extremities.  3. Type 2 diabetes mellitus.  4. Chronic obstructive pulmonary disease.  5. Hypothyroidism.  6. Bipolar disorder.  7. Gastroesophageal reflux disease.   DISCHARGE MEDICATIONS:  1. Augmentin 875 mg p.o. q.12h for 14 days.  2. Lithium 300 mg two tablets p.o. b.i.d.  3. Avandia 2 mg p.o. daily.  4. Synthroid 75 mcg daily.  5. Risperdal 4 mg p.o. q.h.s.  6. Protonix 40 mg p.o. daily.  7. Albuterol MDI two puffs q.6h p.r.n.  8. Flovent 110 mcg two puffs b.i.d.   DISPOSITION:  The patient was discharged to her home and she will be  followed up as scheduled to the Acute Care Clinic on April 08, 2004.  The  pertinent reason of this follow-up visit would be to ensure resolution of  her lower extremity cellulitis.  There are no laboratory values that require  follow-up of this visit.   CONSULTATIONS:   PROCEDURE:  No major procedures or consultations were performed on this  admission.   HISTORY OF PRESENT ILLNESS:  This is a 50 year old Caucasian woman with past  medical history significant for diabetes mellitus and chronic obstructive  pulmonary disease.  She presented to [facility] with  progressive redness and swelling of her right lower extremity.  This had  apparently followed a fall on this leg when she had tripped over a wire.  This condition had progressed over 10 days and was associated with mild  decreased sensation in this lower  extremity.  She noted no fever, no chills,  and no rigors.  She noted no obvious bleeding from the wound, but had noted  occasional purulent discharge.   ALLERGIES:  TETRACYCLINE, GLUCOTROL, DITROPAN all to which she develops a  skin rash.   SOCIAL HISTORY:  She is a current smoker, smokes 1-1/2 packs of cigarettes  per day for the past 25 years.  She was a former alcohol user, quit four  years ago.  She is divorced and is on disability.   PHYSICAL EXAMINATION:  VITAL SIGNS:  Blood pressure 140/61, pulse 86,  respirations 20 per minutes, temperature 98.2, and she was saturating at 90%  on room air.  GENERAL:  She is a pleasant lady and looks comfortable in bed.  HEENT:  Eyes; she is wearing corrective lenses, but pupils are bilaterally  equally reactive to light and accommodation.  NECK:  Supple, there is no JVD and no lymphadenopathy palpable.  LUNGS:  Bilateral expiratory wheezes more profound on her bases.  No  crackles are auscultated.  HEART:  Heart sounds S1 and S2 are normal and pulse is regular in rate and  rhythm.  ABDOMEN:  Distended, obese, nontender, and soft.  Bowel sounds are normal.  EXTREMITIES:  Her right leg is erythematous and has lesions on the anterior  tibial surface.  She has hyperpigmentation on both lower extremities and  this is symmetrical and probably indicative of chronic venous insufficiency.  She has no palpable cord or veins.  She has very mild tenderness on  palpation.  She has glove-stocking kind of neurological deficits over her  arms and legs.  Peripheral pulses are palpable bilaterally.  SKIN:  There are callosities visible over both feet.  LYMPH NODES:  There is no palpable inguinal lymphadenopathy.  NEUROLOGY:  Her cranial nerve examination is unremarkably normal and she has  a mild sensory deficit as listed above on modalities tested of fine touch  and pressure.  She has no cerebellar or motor dysfunction.   LABORATORY DATA:  Hemoglobin 15.1,  white blood cell count 9.0, platelet  count of 172.  She had neutrophils at 29%.  Basic metabolic panel showed a  sodium of 134, potassium of 3.6, chloride 98, bicarbonate 29, BUN 11,  creatinine of 1.0, and glucose of 148.  Her liver panel showed an AST of 16,  ALT of 15, alkaline phosphatase of 124, and total bilirubin of 0.4.  Blood  cultures prior to discharge were preliminary results and were both negative.   HOSPITAL COURSE:  Problem 1.  Cellulitis.  The patient had an obvious fall  in the past 10 days or so and this propagated her current problems.  With an  underlying relative immunodeficiency propagated by diabetes mellitus, she is  likely to have a cellulitis.  Her clinical examination did not support the  presence of deep venous thrombosis and this was not inquired into or  attempted to be diagnosed.  The patient was started on intravenous Zosyn in  order to cover a wide spectrum of organisms and she was transitioned over to  p.o. Augmentin which she will be taking when discharged for a total of  treatment duration of 14 days.  The patient prior to discharge was pretty  comfortable and experiencing no pain with relatively decreased swelling of  her right lower extremity.  Erythema on the right lower extremity was also  notably less and this should be followed up on her clinic follow-up visit.   Problem 2.  COPD.  During an inpatient stay, the patient was started on  nebulizer treatment with albuterol and Atrovent and prior to discharge, it  was explained to her on how to best use her albuterol meter dose inhaler and  combine this with her Flovent.  She was advised that her albuterol is  predominantly a rescue inhaler and should be used as this way and not as  scheduled medicine.   Problem 3.  Bipolar disease.  The patient was advised to continue with Lithium carbonate and Risperdal as she has been taking as an outpatient.   Problem 4.  Hypothyroidism.  The patient will be  continued on Synthroid 75  mcg p.o. daily and this is based on the fact that her TSH that was screened  while she was visiting in the outpatient clinic was normal.   Problem 5.  Diabetes mellitus.  The patient will continue on Glucophage,  Avandia, and her dietary restrictions which have helped her maintain a  hemoglobin A1C of 5.7% that was noted about 1-1/2 months ago at the [facility].      Zetta Bills, MD                             Gary Fleet, M.D.    JP/MEDQ  D:  04/01/2004  T:  04/02/2004  Job:  [id number]

## 2011-01-15 NOTE — Discharge Summary (Signed)
[name], [name]             ACCOUNT NO.:  [id number]   MEDICAL RECORD NO.:  [id number]          PATIENT TYPE:  INP   LOCATION:  5742                         FACILITY:  [facility]   PHYSICIAN:  Duncan Dull, M.D.     DATE OF BIRTH:  08/08/1961   DATE OF ADMISSION:  04/27/2005  DATE OF DISCHARGE:  04/29/2005                                 DISCHARGE SUMMARY   DISCHARGE DIAGNOSIS:  1.  Left leg cellulitis.  2.  Schizo-effective disorder.  3.  Chronic obstructive pulmonary disease.  4.  Diabetes.  5.  Urge incontinence.  6.  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  7.  History of noncompliance.  8.  Morbid obesity.  9.  History of MRSA and Proteus infection of the left foot (July 2006      treated with clindamycin).  10. History of bradycardia with type 1 block.  11. History of tobacco abuse.   DISCHARGE MEDICATIONS:  1.  Lithium 300 mg p.o. b.i.d.  2.  Risperdal 4 mg p.o. q.h.s.  3.  Protonix 40 mg p.o. daily.  4.  Glucophage 500 mg p.o. b.i.d.  5.  Synthroid 0.075 mg p.o. daily.  6.  Erythromycin 500 mg p.o. b.i.d.  7.  Flovent 2 puffs b.i.d.  8.  Sliding scale insulin a.c. and h.s.  9.  Clindamycin 600 mg p.o. q.6h. x 14 days.   CONDITION ON DISCHARGE:  Throughout [name]'s hospital stay, she has  been severely agitated and overtly psychotic, delusional, paranoid, and very  resistant to care.  Her psychosis is significantly impairing her ability to  care for herself and she has not cooperated with critical medical care.  It  has been necessary to forcibly administer needed medications.  She has  refused all lab draws, medications, and most routine physical examinations.  Her cellulitis for which she was admitted for can be managed nonsurgically  per Dr. Ovidio Kin.  We have attempted to treat with p.o. clindamycin,  however, this can be done as an outpatient.  Her more acute need is  treatment of her schizoaffective disorder which has significantly impaired  her  ability to care for herself.  Following a full course of clindamycin  (two weeks) she would benefit from a follow up examination of her left lower  extremity.   PROCEDURES:  None.   CONSULTATIONS:  Antonietta Breach, M.D., psychiatry  Sandria Bales. Ezzard Standing, M.D., general surgery   CHIEF COMPLAINT:  Left leg cellulitis.   HISTORY OF PRESENT ILLNESS:  [name] is a 50 year old white female with  multiple medical issues who presents with left lower extremity edema and  erythema.  The patient was recently discharged to a skilled nursing facility  on clindamycin status post left foot I&D and MRSA Proteus infection.  The  medical staff was unable to obtain any further history due to patient's  severe delusions, paranoia, and psychosis.   ALLERGIES:  Tetracycline, Ditropan, Glucotrol.   PAST MEDICAL HISTORY:  1.  Schizo-effective disorder.  2.  COPD.  3.  Diabetes.  4.  Urge incontinence.  5.  Hypothyroidism secondary to Lithium.  6.  History of leg swelling.  7.  History of noncompliance.  8.  Morbid obesity.  9.  History of MRSA and Proteus infection of left lower extremity (July      2006).  10. Status post I&D of left foot.  11. History of bradycardia with type 1 block.  12. History of tobacco abuse.   HOME MEDICATIONS:  1.  Lithium 300 mg p.o. b.i.d.  2.  Risperdal 4 mg p.o. q.h.s.  3.  Protonix 40 mg p.o. daily.  4.  Glucophage 500 mg p.o. b.i.d.  5.  Synthroid 0.075 mg p.o. daily.  6.  Erythromycin 500 mg p.o. b.i.d.   We were unable to obtain any history from the patient regarding substance  abuse, social history, family medical history, and review of systems due to  the patient's refusal to be interviewed.   PHYSICAL EXAMINATION:  Vital signs with temperature 97.3, heart rate 80,  respirations 16, blood pressure 98/62.  General:  No acute distress, very  agitated, obese.  HEENT:  PERRLA, extraocular movements intact, erythema  around the lips, poor dentition.   Pulmonary:  Clear to auscultation  bilaterally, good air movement.  Cardiovascular:  S1 and S2, regular rate  and rhythm.  Abdomen:  Soft, obese, large ventral hernia, active bowel  sounds x 4.  Extremities:  Bilateral lower extremity hyperpigmentation  secondary to peripheral vascular disease, left leg with 2+ pitting edema,  red, warm, tender.  Neurological:  No gross or focal deficits.  Psychiatric:  Pressured speech, agitated, delusional, paranoid.   LABORATORY DATA:  Admission labs reveal WBC 9.3, hemoglobin 14, platelets  196, ANC 6.7, MCV 100.7, RDW 12.6, the patient refused to allow any further  blood draws.   HOSPITAL COURSE:  Problem 1:  Left lower extremity cellulitis.  The patient's left leg  appeared to have cellulitis as it was edematous, erythematous, tender, warm,  we attempted to obtain blood cultures and administer IV antibiotics of  vancomycin and Zosyn.  The patient refused to allow any blood draws or IV  insertion and became severely combative.  We attempted to change the  antibiotics to clindamycin to allow for p.o. administration, the patient  refused to take any p.o. medications, despite medication with Haldol and  Ativan.  We consulted Dr. Ovidio Kin who performed a brief skin exam of  her perineal area and left lower extremity and did feel that she had  probable fungal infection below the pannus and a localized abscess.  He also  felt like she had induration and cellulitis of the left leg and she felt  that this did not require surgical intervention.  We, therefore, wished to  treat the patient with oral antibiotics, however, this will not be possible  due to her psychotic state.  We hope that her treatment can be continued in  an inpatient psychiatric facility.   Problem 2:  Schizo-effective disorder.  The patient was overtly psychotic,  delusional, paranoid, and agitated throughout her hospital stay.  She refused anti-psychotic medications and was given  Haldol and Ativan with  minimal affect.  She was very resistant to care and her psychosis severely  impaired her ability to care for herself.  She did not cooperate with  critical medical care and required forcible administration of her  medications.  She demonstrates severely impaired judgment and is unable to  care for herself and unable to accept critical medical care.   Problem 3:  Hypothyroidism.  The patient has a history of hypothyroidism  secondary to lithium.  We attempted to evaluate this with TSH, however, she  refused the blood draw.  We further attempted to administer her Synthroid  which she also refused.   Problem 4:  Diabetes.  The patient refused CBG and sliding scale insulin  during her hospital stay.   Problem 5:  COPD.  The patient refused pulmonary examination, however, did  accept Flovent and refused Albuterol.   Problem 6:  Rule out Lithium toxicity.  The patient has a history of lithium  toxicity.  We evaluated this with a serum lithium level which we were able  to obtain early during her hospital stay, this level was within normal  limits and we attempted to administer her home dose of lithium which she  refused.   DISCHARGE LABS AND VITAL SIGNS:  The patient refused any subsequent labs or  vital signs.  There are no pending labs.      Sharin Mons, M.D.    ______________________________  Duncan Dull, M.D.    WC/MEDQ  D:  04/29/2005  T:  04/29/2005  Job:  [id number]   cc:   Antonietta Breach, M.D.   Sandria Bales. Ezzard Standing, M.D.  [address], [address]  [place]  [place] [address]

## 2011-01-15 NOTE — Discharge Summary (Signed)
NAME:  [name], [name]                       ACCOUNT NO.:  [id number]   MEDICAL RECORD NO.:  [id number]                   PATIENT TYPE:  INP   LOCATION:  4713                                 FACILITY:  [facility]   PHYSICIAN:  Zetta Bills, MD                       DATE OF BIRTH:  1960/09/29   DATE OF ADMISSION:  04/18/2004  DATE OF DISCHARGE:                                 DISCHARGE SUMMARY   ADDENDUM:  The patient's discharge laboratories were as follows:  Hemoglobin  of 16.8, hematocrit of 50.8, white cell count of 8.1 and platelet count of  168.  The patient's sodium was at 135, potassium of 4.1, chloride of 102,  bicarbonate of 31, BUN of 31, creatinine of 1.2 and a glucose of 310.  The  patient's calcium was at 11.8 with a phosphorus of 4.0.  The patient's serum  lithium level was at 0.88.  The patient's TSH was at 4.786.                                                Zetta Bills, MD    JP/MEDQ  D:  04/23/2004  T:  04/23/2004  Job:  [id number]

## 2011-01-15 NOTE — Discharge Summary (Signed)
[place]  Patient:    [name], [name]                    MRN: [id number] Adm. Date:  [id number] Disc. Date: [id number] Attending:  Alfonso Ramus CC:         Netta Cedars, M.D.  Anson Fret   Discharge Summary  DISCHARGE DIAGNOSES:  1. Chest pain.  2. 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  3. Bipolar disorder, manic phase.  4. Normocytic anemia.  5. Abdominal pain with nausea, vomiting, and diarrhea.  6. Diabetes mellitus, type 2, with nephropathy and proteinuria.  7. Chronic obstructive pulmonary disease/asthma.  8. Hypothyroidism.  9. Urge incontinence. 10. Morbid obesity.  DISCHARGE MEDICATIONS:  1. Haldol 1 mg b.i.d.  2. Depakote ER 500 mg p.o. q.h.s.  3. Haldol 1 mg p.o./IM q.4h. p.r.n. for agitation.  4. Ibuprofen 800 mg q.i.d. p.o. p.r.n.  5. Lithium carbonate 300 mg p.o. t.i.d.  6. Synthroid 0.041 mg p.o. q.d.  7. Seroquel 200 mg one tablet p.o. at the hour of sleep.  8. Serevent two puffs b.i.d.  9. Vanceril two puffs b.i.d. 10. Albuterol MDI two puffs q.6h. as needed for shortness of breath. 11. Glucophage 500 mg p.o. b.i.d.  PROCEDURES:  Portable chest x-ray on March 03, 2001, showed heart size and vascularity that were normal.  The lungs were clear.  No bony abnormality.  CONSULTATIONS: 1. Netta Cedars, M.D., psychiatrist. 2. Anson Fret, assessment clinician with [facility].  ADMISSION HISTORY:  The patient is a 50 year old, obese, white female with a history of multiple somatic complaints, diabetes mellitus, asthma/COPD, and psychiatric disorder, who has had chest pain x 1 month, which has been especially bad in the last few days.  The pain is almost constant, 10/10 at times.  The pain is worse with activity, motion, and laying flat; the pain is improved when she is sitting upright.  The  pain does not radiate, but is associated with jaw pain.  She notes shortness of breath and chest pain which is "worse with my asthma."  Positive for generalized weakness.  Positive for nausea with emesis three times in the last 24 hours.  She also notes orthopnea and she sleeps in a recliner.  Risk factors for coronary artery disease include diabetes mellitus, obesity, history of smoking, and the fact that she is a postmenopausal female.  However, she has a negative family history for coronary artery disease, no history of hypertension, and no history of hypercholesterolemia.  PAST MEDICAL HISTORY:  See the list of discharge diagnoses.  SOCIAL HISTORY:  The patient has a common law husband.  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  She notes heavy alcohol use in the past, but has been off alcohol for three years.  She has a history of one to four packs per day of tobacco use x 20 years, but has stopped smoking approximately one month.  She lives a "couch potato lifestyle."  She denies drug use.  FAMILY HISTORY:  Father deceased at 33 due to leukemia/emphysema.  Mother is alive at 65.  She has a history of hypertension and osteoporosis.  She has five brothers over 58, one of whom has obesity.  One brother was deceased at age 30 with AIDS.  ALLERGIES:  The patient reports allergies to TETRACYCLINE, DITROPAN, and DETROL.  REVIEW  OF SYSTEMS:  Positive for hemoptysis/blood-tinged sputum.  She notes that it recurs almost every day, generally less than one teaspoon of blood per day.  Positive for headache, blurred vision, dysphagia, stomach pains, constipation/diarrhea, black-bloody stools, overactive bladder, facial acne, leg edema, weight gait with wide fluctuation, increased fluid intake, joint/muscle aches, and nasal congestion.  PHYSICAL EXAMINATION:  VITAL SIGNS:  Temperature 97.7 degrees, pulse 86, respiratory rate 18, blood pressure 118/55,  O2 saturation 99% on room air.  GENERAL APPEARANCE:  An obese, talkative woman in no acute distress.  EYES:  EOMI.  Anicteric.  EARS, NOSE, AND THROAT:  Positive for cerumen.  The oropharynx shows very poor dentition.  Jaw mildly tender with normal motion.  NECK:  Thick, supple.  LUNGS:  Clear.  Normal motion.  CARDIOVASCULAR:  Regular rate and rhythm.  No murmurs.  Positive for trace edema.  ABDOMEN:  Soft, nontender, obese with positive bowel sounds.  CHEST:  Tender to palpation on sternum.  MUSCULOSKELETAL:  Full strength, 5/5, in all extremities.  NEUROLOGIC:  Cranial nerves II-XII intact.  No focal neurologic deficits.  SKIN:  Mild acne on face and chest.  ADMISSION LABORATORY AND X-RAY DATA:  The blood gas study showed an FIO2 of 21, a pH of 7.455, a pCO2 of 42.0, a pO2 of 77.0, a bicarbonate of 30.0, and acid base deficit of 7.0.  White blood cell count of 12.3, hemoglobin 10.4, platelets 291, MCV 92.4, and ANC 9.8.  The sodium was 123, potassium 3.7, chloride 89, bicarbonate 29, BUN 14, creatinine 0.7, and glucose 164.  The serum osmolality was 260.  The CK was 431, the CK-MB fraction was 14.1, and the troponin I was less than 0.01.  The chest x-ray was normal.  The EKG showed a normal sinus rhythm with no ST elevation.  HOSPITAL COURSE: #1 - CHEST PAIN:  The first set of cardiac enzymes at 3:23 a.m. on March 03, 2001, revealed a total CK of 431, a CK-MB fraction of 14.1, a relative index of 3.3, and a troponin I of less than 0.01.  A second set of cardiac enzymes obtained at 11:23 a.m. on March 03, 2001, revealed a total CK of 247, a CK-MB of 8.3, a relative index of 3.4, and a troponin I of less than 0.01.  The third and final set of cardiac enzymes obtained at 7:23 p.m. on March 03, 2001, showed a total CK of 190, a CK-MB of 5.7, a relative index of 3.0, and a troponin I  of less than 0.01.  The patient considered ruled out for myocardial infarction, particularly  given the fact that the EKG was completely normal. No further investigations were pursued and telemetry was discontinued.  #2 - HYPONATREMIA:  The sodium on admission was 123.  The sodium drawn at 11:23 a.m. on March 03, 2001, was 134.  She had a urine output of 8.8 L on March 03, 2001, in the face of p.o. fluid restriction.  There was concern that the hyponatremia might be due to possible psychogenic polydipsia.  The other concern was for nephrogenic DI secondary to lithium use.  Urine osmolality measured on March 03, 2001, was 1 with a reference range of [phone] mOsm/kg. Fluid restrictions were listed on March 04, 2001.  There was concern that the hyponatremia was due to nephrogenic DI and psychiatry was consulted to determine an alternative therapy to lithium.  Total I&Os for Saturday, March 04, 2001, were 1320/1800.  The total urine output on March 05, 2001, however, was 7 L.  The sodium had normalized over that time.  Fluid restrictions were again instituted on March 06, 2001; however, the patient refused to observe fluid restrictions and to conserve urine.  The total fluid intake and urine output normalized again on March 07, 2001.  #3 - BIPOLAR DISORDER, MANIC PHASE:  Karlene Einstein, M.D., was called by the nurses to the floor on March 04, 2001, due to threats by the patient to leave the hospital against medical advice.  The patient had pulled out her IV. She appeared actively psychotic, claiming that the medical staff wanted to kill her.  She claimed to be having conversations with God and that she could predict the future.  Netta Cedars, M.D., saw the patient on March 05, 2001. On exam, the patient admitted to hearing voices asking her questions.  Netta Cedars, M.D., found her mood elated, her affect labile, and her speech pressured.  He noted flight of ideas and at times circumstantial thought.  He concluded that she was in a manic phase of her bipolar disorder.  He suggested that the  patient might need to go lithium.  In the meantime, he recommended added Depakote as soon as possible.  Pending medical clearance, the patient was to be admitted for inpatient treatment at the [facility]. Paperwork was prepared on March 06, 2001, for transfer to the [facility].  Due to a shortage in beds, arrangements were made on March 07, 2001, to have the patient transferred to [facility].  The sheriff came to pick up Ms. ______ on March 08, 2001, for transfer.  #4 - NORMOCYTIC ANEMIA:  Hemoglobin levels remained stable throughout the hospital course.  #5 - ABDOMINAL PAIN WITH NAUSEA, VOMITING, AND DIARRHEA:  The symptoms had resolved within the first day.  By March 04, 2001, the patient was tolerating a regular diet without problems.  She has a GI appointment with Fayrene Fearing L. Randa Evens, M.D., next month.  #6 - DIABETES MELLITUS, TYPE 2, WITH NEPHROPATHY:  Well controlled on current medications.  #7 - CHRONIC OBSTRUCTIVE PULMONARY DISEASE/ASTHMA:  Well controlled on current medications.  DISCHARGE LABORATORY DATA:  Sodium 136, potassium 3.6, chloride 102, CO2 28, BUN 17, creatinine 1.0, glucose 230, calcium 10.3.  The white blood cell count was 9.6, hemoglobin 11.3, MCV 95.3, and platelets 297.  DISCHARGE FOLLOW-UP:  The patient was transferred to [facility]. All additional follow-up should be scheduled through providers there. DD:  03/09/01 TD:  03/10/01 Job: [id number]

## 2011-01-15 NOTE — Discharge Summary (Signed)
[facility]  Patient:    [name], [name] Visit Number: [id number] MRN: [id number]          Service Type: EMS Location: [facility] Attending Physician:  Hanley Seamen Dictated by:   Reymundo Poll Dub Mikes, M.D. Admit Date:  04/27/2001 Discharge Date: 04/28/2001                             Discharge Summary  CHIEF COMPLAINT AND HISTORY OF PRESENT ILLNESS:  This was the first admission to [facility] for this 50 year old female admitted for depression and psychosis.  History of depression, bipolar, with psychotic features.  Recently discharged from [facility] a week prior to this admission. She reported there was change in medication, reported some delusions, feeling like she was "the angel of death and the angel of light," positive paranoid feelings that people were following her.  Her sleep has been decreased as low as a couple of hours up to seven hours.  Her appetite is decreased, she reports an intentional 52 pound weight loss to control her diabetes. Identified no specific triggers in regard to her depression.  Denied any suicidal or homicidal ideas.  PAST PSYCHIATRIC HISTORY:  Dr. [name], [facility]; sees [place] there who is her therapist.  Last admission [facility] two weeks prior to this admission.  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  PAST MEDICAL HISTORY: 1. Type diabetes. 2. Asthma. 3. Hypothyroidism. 4. Blood in stool.  MEDICATIONS UPON ADMISSION: 1. Lithium 300 two twice a day. 2. Lasix 40 mg every day. 3. Multivitamin one daily. 4. Protonix 40 mg every day. 5. Synthroid 0.1 mg every day. 6. Risperdal 4 mg every day.  MENTAL STATUS EXAMINATION UPON ADMISSION:  Reveals an alert, overweight, unkempt female, cooperative, good eye contact, appears older than her stated age.  Speech was rapid but relevant.  Mood was anxious.  Affect was  mildly irritable.  Thought processes: Positive for paranoia, delusions, no suicidal or homicidal ideas, no auditory or visual hallucinations.  Cognition: Well preserved.  ADMITTING DIAGNOSES: Axis I:    Bipolar disorder, depressed with psychotic features. Axis II:   No diagnosis. Axis III:  1. Diabetes type 2.            2. Hypothyroidism.            3. Asthma. Axis IV:   Moderate. Axis V:    Global assessment of functioning upon admission 35, highest global            assessment of functioning in the last year 60-65.  HOSPITAL COURSE:  She was admitted and started in intensive individual and group psychotherapy.  Her medications were changed as follows: She was kept on her Protonix and Lasix, her lithium was increased to two in the morning and three at bedtime, Risperdal was increased to 4 mg in the morning and 1 mg at bedtime; her other medications were kept the same.  On August 9, she was much improved.  Her affect was bright.  Her mood was euthymic, no overt mood swings.  No suicidal ideas, no homicidal ideas.  Discharge was considered and granted.  DISCHARGE DIAGNOSES: Axis I:    Bipolar disorder, depressed with psychotic features. Axis II:   No diagnosis. Axis III:  1. Diabetes mellitus type 2.            2.  Hypothyroidism.            3. Asthma. Axis IV:   Moderate. Axis V:    Global assessment of functioning upon discharge 55-60.  DISCHARGE MEDICATIONS: 1. Protonix 40 mg daily. 2. Lasix 40 mg every day. 3. Lithium 300 mg two in the morning and three at bedtime; lithium level 1.03. 4. Synthroid 0.1 mg every day. 5. Risperdal 4 mg in the morning and 1 mg at bedtime. 6. Flovent two puffs twice a day. 7. Serevent two puffs twice a day. 8. Albuterol two puffs every four hours.  FOLLOWUP:  [facility]. Dictated by:   Reymundo Poll Dub Mikes, M.D. Attending Physician:  Hanley Seamen DD:  05/24/01 TD:  05/25/01 Job: [id number]

## 2011-01-15 NOTE — Discharge Summary (Signed)
NAME:  [name], [name]                       ACCOUNT NO.:  [id number]   MEDICAL RECORD NO.:  [id number]                   PATIENT TYPE:  INP   LOCATION:  4713                                 FACILITY:  [facility]   PHYSICIAN:  C. Ulyess Mort, M.D.             DATE OF BIRTH:  1961/01/09   DATE OF ADMISSION:  04/18/2004  DATE OF DISCHARGE:  04/23/2004                                 DISCHARGE SUMMARY   DISCHARGE DIAGNOSES:  1. Acute exacerbation of chronic lung disease, likely due to:     a. Bacterial lower respiratory tract infection.     b. Ongoing tobacco abuse.  2. Lithium toxicity.  3. Type 2 diabetes mellitus.  4. Hypothyroidism.  5. 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  6. Bipolar disorder.  7. Gastroesophageal reflux disease.  8. Resolving right lower extremity cellulitis.  9. Recurrent furuncles over anterior abdominal wall.  10.      Hypertension.   DISCHARGE MEDICATIONS:  1. Nebulized albuterol q.6h. p.r.n.  2. Nebulized Atrovent q.6h. p.r.n.  3. Prednisone taper dose 60 mg p.o. daily x1 day, followed by 50 mg p.o.     daily x2 days, then 40 mg p.o. daily for two days, then 30 mg p.o. daily     for two days, then 20 mg p.o. daily for two days, then 10 mg p.o. daily     for two days, then stop.  4. Oxygen via nasal cannula to keep oxygen saturations above 90%.  5. Avelox 400 mg p.o. daily for four days (to complete a total treatment     duration of eight days).  6. Avandia 2 mg p.o. daily.  7. Metformin 500 mg p.o. q.12h.  8. Risperdal 2 mg p.o. q.h.s.  9. Lithium carbonate 300 mg p.o. q.12y.  10.      Synthroid 75 mcg p.o. daily.  11.      Lisinopril 10 mg p.o. daily.  12.      Protonix 40 mg p.o. daily.   DISPOSITION AND FOLLOW-UP:  The patient is to be discharged to [facility] skilled nursing facility as a result of her evaluation by  therapist/occupational therapist, who found that this patient was having a  terrible social support at  home and would require bridging of her  physical/health status before resuming her normal activities at home.  The  patient will be followed up at the [facility] outpatient clinic following  discharge from her skilled nursing facility.  In the interim, we anticipate  the doctors at the skilled nursing facility to resume her medical care over  this short period of her stay at their facility.   CONSULTATIONS:  1. Physical therapy/occupational therapy.  2. Case workers.   PROCEDURES:  1. On April 18, 2004, a chest x-ray was obtained that showed stable     cardiomegaly and low lung volumes.  2. On April 20, 2004, a chest x-ray was done again  that showed slight     improvement in lung volumes with a residual left lower lobe atelectasis.  3. On April 20, 2004, in view of her progressive hypoxia and sluggish     improvement for management of acute exacerbation of COPD, spiral CT scan     of the chest was done to rule out pulmonary embolus, and this showed no     evidence of pulmonary emboli.  Cardiomegaly was seen as well as minor     bibasilar linear atelectasis.  Mildly enlarged mediastinal lymph nodes     were noted, which may either represent a reactive or neoplastic process.     A follow-up study of her chest will be beneficial in three months.   HISTORY OF PRESENT ILLNESS:  This is a 50 year old Caucasian woman with past  medical history significant for chronic obstructive lung disease, type 2  diabetes mellitus, hypothyroidism, obesity, and ongoing tobacco abuse.  For  one week prior to presentation she had been having progressive shortness of  breath as well as a mildly productive cough.  Her cough was productive of a  yellowish phlegm.  The patient also noted easy fatigability the last few  days prior to presentation.  The patient notes that her shortness of breath  was worse upon exertion.  She has been using her albuterol and Flovent  inhalers as b.i.d. two times a day doses,  and she believes that this is how  she was told how to use them in the past.  The patient denies any  palpitations, chest pain, or progressive leg swelling.  Of note, this  patient was recently admitted and discharged from the hospital for  cellulitis of her right lower extremity and on follow-up at the [facility]  outpatient clinic was noted to be improving although had new furuncles over  her anterior abdominal skin.   ALLERGIES:  The patient is allergic to TETRACYCLINE, GLUCOTROL, and  DITROPAN, to which she developed skin rashes.   PAST MEDICAL HISTORY:  1. Chronic venous insufficiency of both lower extremities.  2. Chronic obstructive lung disease.  3. Bipolar disorder (this is followed up at [facility]).  4. Gastroesophageal reflux disease, for which she takes Protonix.   MEDICATIONS PRIOR TO PRESENTATION:  1. Albuterol two puffs q.6h. p.r.n.  2. Flovent 110 mcg two puffs q.12h.  3. Lithium 600 mg p.o. q.12y.  4. Avandia 2 mg p.o. daily.  5. Protonix 40 mg p.o. daily.  6. Glucophage 500 mg p.o. q.12h.  7. Risperdal 4 mg p.o. q.h.s.   SOCIAL HISTORY:  The patient is a current smoker, smokes two to three packs  a day of cigarettes for the past 25 years.  She is a former alcohol user and  apparently quit alcohol in 2001.   SOCIAL HISTORY:  She is divorced, cohabits currently with a boyfriend, and  is educated until university education.  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  At this time  the patient is on disability and has Medicare and Medicaid paying for her  health care and prescriptions.   FAMILY HISTORY:  Significant for diabetes mellitus and colon cancer in her  brother.   PHYSICAL EXAMINATION:  VITAL SIGNS:  On presentation, she had a pulse of 88,  blood pressure of 114/69, temperature of 99, respirations of 24 per minute,  oxygen saturation of 83% on room air. GENERAL:  She was an obese  woman,  disheveled condition, sitting up in bed in  no acute distress.  HEENT:  On examination of her eyes, pupils were equally reactive to light  and accommodation, and she is wearing corrective lenses.  NECK:  This is supple, and there is no adenopathy palpable.  RESPIRATORY:  She has biphasic expiratory wheezes with rhonchi bilaterally.  CARDIOVASCULAR:  Her pulses is regular in rate and rhythm.  Heart sounds S1  and S2 were normal.  ABDOMEN:  This was soft, obese, not tender, not distended, and bowel sounds  are normal.  Over the anterior abdominal wall there are healing furuncles  that are visible with excoriation marks.  No obvious pus drainage is noted  from these furuncles.  EXTREMITIES:  She has well-healed bilateral ulcers on her shin.  NEUROLOGIC:  She is oriented to time, person, and place, and her cranial  nerve exam was normal.   ADMISSION LABORATORY DATA:  She had a sodium of 136, potassium of 4.1,  chloride of 97, bicarbonate of 36, BUN of 12, creatinine of 1.3, and glucose  of 137.  Arterial blood gas was drawn, and this showed a pH of 7.40, PCO2 of  65, PO2 of 85, and bicarbonate of 40.  TSH level was a 5.849.  Her CBC on  admission showed a hemoglobin of 15.3, hematocrit of 47.6, white cells of  8.9, and platelet count of 205.  MCV was at 98.7.  Absolute neutrophil count  was at 7.2, which is quantified at 81%.  The patient's lithium level on  admission was 1.81.  The patient's calcium level on admission was 10.7.  Her  urinalysis was unremarkably normal.   HOSPITAL COURSE:  Problem 1.  CHRONIC OBSTRUCTIVE PULMONARY DISEASE EXACERBATION:  This was  thought to be secondary to two main reasons.  On admission it was noted that  she did not have an obvious leukocytosis or a fever, but it was presumed  that she could possibly have a low-grade bacterial lower respiratory tract  infection, and as such the patient was started on Avelox.  The reason for  choosing this  antibiotic was as empirical cover for community-acquired  pneumonia and as such would target most of the organisms that cause an  exacerbation due to infection.  The other reason that she could be having a  COPD exacerbation at this time was ongoing smoking coupled with her  inability to use medications as prescribed for her.  The patient has not  been compliant priorly with her albuterol and Flovent inhalers and we hope  that at the skilled nursing facility she will be able to pick up this basic  method by which to get her albuterol inhalers.  A smoking cessation consult  was obtained for this patient, but at this time the patient is very  reluctant to stop smoking as she does not perceive this to be a problem that  causes her hospitalization.  Total treatment duration for the Avelox will be  eight days, and it is expected that she will take her Avelox for four more  days after arrival to the skilled nursing facility.  Oxygen via nasal cannula should be used as needed to keep her oxygen saturations above 90%.  A steroid tapered dose is also recommended, and the schedule of this is  described above, and this is going to be an adjunct to her intravenous  steroid therapy with Solu-Medrol that she had received in this  hospitalization.   Problem 2.  LITHIUM TOXICITY:  The patient's lithium level on admission was  elevated, and this was coupled together with her physical appearance at  presentation, which was that of excessive drooling through her mouth and  slow mentation.  The patient was oriented to time, person, and place, but  would take abnormally long periods of time to respond to question and would  appear very lethargic at most times.  We gradually titrated down on her  lithium dose, and we presume that 300 mg p.o. q.12h. will be sufficient to  keep her within therapeutic range and to control her other symptoms.  Will  continue with her Risperdal 2 mg p.o. q.h.s. but a close watch has  to be  kept for extrapyramidal possible side effects of this medication that may  also lead to excessive salivation.   Problem 3.  TYPE 2 DIABETES MELLITUS:  Initially the patient was restarted  on her Avandia and metformin, and together with this, Novolog sliding scale  insulin was started due to the concomitant use of steroids.  In the interim  period when the patient had her spiral CT scan, the metformin was stopped to  avoid any acidosis-like complications and upon discharge, it is recommended  that his be started as she is now out 72 hours from her spiral CT scan  requiring intravenous contrast dye.  Her last hemoglobin A1C that is known  was at 5.7%, done in the month of June.   Problem 4.  HYPOTHYROIDISM:  The patient's TSH levels on this admission were  noted to be normal, and she will be continued on Synthroid 75 mcg p.o.  daily.  Periodic checks of her TSH at every four to six-month intervals is  recommended.   Problem 5.  HYPERCALCEMIA SECONDARY TO PRIMARY HYPERPARATHYROIDISM:  On  admission it was noted that this patient had elevated serum levels of  calcium and free/total parathyroid hormone level was obtained.  The patient  at this time is asymptomatic for features of hypercalcemia, and this  biochemical anomaly should be kept in mind whenever assessing her in future,  as she may need surgical/therapeutic interventions in future should she  become symptomatic.   Problem 6.  BIPOLAR DISORDER:  We have successfully titrated down on this  patient's lithium carbonate doses in order to maintain her within the  therapeutic range, and we will continue her on her Risperdal 2 mg p.o.  q.h.s.   Problem 7.  GASTROESOPHAGEAL REFLUX DISEASE:  The patient will continue  taking Protonix 40 mg p.o. daily that she has done in the past with good  success.  P.R.N. doses of Maalox or Tums can be used at the skilled nursing  facility.      Zetta Bills, MD                             Gary Fleet, M.D.   JP/MEDQ  D:  04/23/2004  T:  04/23/2004  Job:  [id number]

## 2011-01-15 NOTE — Consult Note (Signed)
[name], [name]             ACCOUNT NO.:  [id number]   MEDICAL RECORD NO.:  [id number]          PATIENT TYPE:  INP   LOCATION:  5742                         FACILITY:  [facility]   PHYSICIAN:  Sandria Bales. Ezzard Standing, M.D.  DATE OF BIRTH:  [birth year]   DATE OF CONSULTATION:  04/28/2005  DATE OF DISCHARGE:                                   CONSULTATION   REASON FOR CONSULTATION:  Cellulitis.   HISTORY OF PRESENT ILLNESS:  Mrs. [name] is a 50 year old female who was  admitted yesterday with left leg cellulitis and bilateral thigh abscesses  that are felt to be MRSA induced. She is currently on clindamycin.  She was  seen by the surgical service on March 12, 2005 for cellulitis of the left  lower extremity and underwent debridement of the left first metatarsal and  fifth metatarsal.  This area grew out MRSA and Proteus.  She has a  significant past medical history of carbuncle and furuncles over the entire  body.   PAST MEDICAL HISTORY:  1.  As above.  2.  Bipolar schizophrenia.  3.  COPD.  4.  Hypothyroidism.  5.  History of lithium overdose.  6.  Venous stasis over the lower extremities.  7.  GERD.  8.  Urinary incontinence.  9.  Diabetes mellitus.  10. Tobacco abuse.  11. Asthma.   SOCIAL HISTORY:  Smoker.  No alcohol.  No illicit drug use.  She is  divorced.  She lived at home prior to her admission in July 2006 but then  she was sent to a skilled nursing facility and I am not certain that she  ever left that facility to go home.   FAMILY HISTORY:  Diabetes, coronary artery disease, and colon cancer.  These  are per record.  She is totally incomprehensible at this time and again I am  seeing these notes from her previous record/consult.   ALLERGIES:  TETRACYCLINE, GLUCOTROL, DITROPAN, SULFA, DOXYCYCLINE.   MEDICATIONS:  Clindamycin, Flovent, NovoLog, Synthroid, lithium, Nicoderm,  and Protonix.   PHYSICAL EXAMINATION:  VITAL SIGNS:  She refuses vitals.  GENERAL:  She is  paranoid at this time.  She has been throwing pitchers  today and when we walked into the room, she had a cup of ice/water and  poured this over her head, rubbing her head and body with her towel.   Dr. Ezzard Standing was able to perform a brief skin exam of her perineal area.  He  did feel she had a probable fungal infection below the panniculus but no  localized abscess.  He did feel like she had a induration and cellulitis of  the left leg.  At that point we will continue to follow the patient but  again have no plans for surgical intervention.   [This lady has severe psych problems which make any surgical management  extremely difficult.  I can find nothing that warrants urgent surgical  attention.]      Guy Franco, P.A.      Sandria Bales. Ezzard Standing, M.D.  Electronically Signed    LB/MEDQ  D:  04/28/2005  T:  04/29/2005  Job:  [id number]   cc:   Duncan Dull, M.D.  Fax: [phone]
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
## 2011-01-15 NOTE — Discharge Summary (Signed)
[name], [name]             ACCOUNT NO.:  [id number]   MEDICAL RECORD NO.:  [id number]          PATIENT TYPE:  INP   LOCATION:  3024                         FACILITY:  [facility]   PHYSICIAN:  Fransisco Hertz, M.D.  DATE OF BIRTH:  02-24-1961   DATE OF ADMISSION:  06/15/2004  DATE OF DISCHARGE:  06/18/2004                                 DISCHARGE SUMMARY   DISCHARGE DIAGNOSES:  1.  Lithium toxicity.  2.  Bipolar affective disorder with psychosis and paranoia.  3.  Diabetes mellitus.  4.  Chronic obstructive pulmonary disease.  5.  Tobacco abuse.  6.  Obesity.   DISCHARGE MEDICATIONS:  1.  Synthroid 75 mcg p.o. daily.  2.  Risperdal 4 mg p.o. q.h.s.  3.  Avandia 2 mg p.o. daily.  4.  Lithium carbonate 300 mg p.o. t.i.d.  5.  Glucophage 500 mg p.o. b.i.d.  6.  Robitussin DM syrup 10 mL q.6h p.r.n. cough.  7.  Cepacol lozenges p.r.n. sore throat.  8.  Flovent 110 mcg two puffs b.i.d.  9.  Combivent one puff q.i.d.   CONSULTATIONS:  Psychiatry was consulted during this admission.  Dr.  Jeanie Sewer saw the patient and evaluated for delirium, psychosis and  paranoia.   PROCEDURES:  There were no invasive procedures during this admission.   FOLLOW UP:  The patient will follow up in the internal medicine outpatient  clinic with her regular physician Dr. Wyonia Hough when she is released from  behavioral health.  The patient will call to schedule an appointment.   DISPOSITION:  The patient is transferred to [facility] on  discharge today.   HISTORY OF PRESENT ILLNESS:  This is a 50 year old woman who presented to  the internal medicine outpatient clinic on the day of admission for a  regularly scheduled appointment with Dr. Wyonia Hough.  The patient was exhibiting  strange behavior during this appointment including psychosis, insensible  speech, delirium, inability to focus attention, and mood lability.  Laboratory testing revealed the patient had a lithium level of  4.22.  The  patient did not wish to be admitted.  She was involuntarily committed for  this hospitalization.   ADMISSION PHYSICAL EXAM:  VITAL SIGNS:  Temperature 98.8, pulse 101, blood  pressure 112/74, oxygen saturation was 93% on room air, weight was 299.8  pounds.  GENERAL:     This is an obese, dirty, disheveled woman sitting in a  wheelchair very agitated.  She refused to answer many questions.  RESPIRATORY:  Lungs are clear to auscultation bilaterally with distant  breath sounds.  CARDIOVASCULAR:  Heart sounds distant, radial pulses 2+ bilaterally,  tachycardia.  PSYCH:  The patient exhibits mood lability, paranoia, nonfluent speech,  nonsensical speech.   ADMISSION LABS:  Sodium 134, potassium 5.2 (hemolysis), chloride 98, bicarb  30, BUN 22, creatinine 1.1, glucose 113, hemoglobin 12.3, hematocrit 36.0,  MCV 97.1, RDW 13.1, bilirubin 0.7, alk phos 80, AFT 20, ALT 15, protein 6.1,  albumin 3.3, calcium 10.7, lithium 4.22, TSH 3.69.  Arterial blood gas:  PH  7.36, CO2 60.3, PO2 59.7, bicarb 29.7, base balance  of positive 7.8, oxygen  saturation of 89%.   BRIEF HOSPITAL COURSE:  1.  Lithium toxicity.  The patient's lithium level of 4.22 is well above the      upper limit of therapeutic level.  We did not continue the patient's      lithium upon admission.  On hospital day #2, lithium level had fallen to      1.3.  Hospital day #3, lithium level was 0.98.  At this time, the      patient was started on lithium 300 mg p.o. b.i.d.  On the day of      discharge, the patient's lithium was 0.75, so her lithium was increased      to 300 mg t.i.d.  Resolution of lithium toxicity did not alter the      patient's paranoia or delusional speech.  An EKG on admission showed      sinus tachycardia and a rightward axis, but no acute changes.  2.  Psychosis and paranoia.  The patient exhibited delusional behavior      during this hospitalization.  She repeatedly alleged that we were       trying to kill her, and that she had proof at home that we were trying      to poison her.  She continued to say that her boyfriend and his friend      were going to come here and bring proof of all the people who are out to      get her.  She at times refused to bathe and refused to have someone      bathe her because she did not trust [place].  The patient also had difficulty      understanding limits on smoking, she could not understand that she could      only go out to smoke with her sitter for no more than 20 minutes at a      time.  The patient's nonsensical speech improved during this      hospitalization.  However, her mood and affect remained very labile.      She also exhibits pressured speech and rambling thoughts.  On the day of      discharge, [name] was continued to be very agitated and alleged      that we were here to kill her.  She was unable to answer questions about      intentions to harm herself.  Dr. Jeanie Sewer of behavioral health      consulted to evaluate her paranoid and delusional behavior.  Dr.      Jeanie Sewer recommended that she be committed voluntarily or involuntarily      to [facility] for further inpatient evaluation of psychosis      and delusions.  The patient had a sitter during her hospitalization.      The patient voluntarily agreed to go to [facility] upon      discharge.  3.  Tobacco abuse.  The patient is a heavy smoker, and has no intentions of      quitting.  Arterial blood gas on admission showed a partial pressure Co2      of 60.3 which is consistent with previous records.  The patient was      given albuterol and Atrovent nebulizers during hospital days 1, 2, and      3.  On hospital day 3, these were discontinued and she was given Flovent  and Combivent.  The patient was discharged on Flovent and Combivent      inhalers.  4.  Diabetes.  This is a nonacute problem.  The patient was continued on     Avandia and  Glucophage during this hospitalization.  The patient was not      diet restricted.  5.  Chronic venous stasis.  This was not an acute problem during      hospitalization.   Upon discharge today, the patient will go to [facility].       KM/MEDQ  D:  06/18/2004  T:  06/18/2004  Job:  [id number]

## 2011-03-01 ENCOUNTER — Emergency Department (HOSPITAL_COMMUNITY)
Admission: EM | Admit: 2011-03-01 | Discharge: 2011-03-01 | Disposition: A | Discharge status: Home / Self Care | Payer: Medicare Other | Attending: Emergency Medicine | Admitting: Emergency Medicine

## 2011-03-01 ENCOUNTER — Emergency Department (HOSPITAL_COMMUNITY): Payer: Medicare Other

## 2011-03-01 DIAGNOSIS — E039 Hypothyroidism, unspecified: Secondary | ICD-10-CM | POA: Insufficient documentation

## 2011-03-01 DIAGNOSIS — I10 Essential (primary) hypertension: Secondary | ICD-10-CM | POA: Insufficient documentation

## 2011-03-01 DIAGNOSIS — R0789 Other chest pain: Secondary | ICD-10-CM | POA: Insufficient documentation

## 2011-03-01 DIAGNOSIS — K219 Gastro-esophageal reflux disease without esophagitis: Secondary | ICD-10-CM | POA: Insufficient documentation

## 2011-03-01 DIAGNOSIS — R109 Unspecified abdominal pain: Secondary | ICD-10-CM | POA: Insufficient documentation

## 2011-03-01 DIAGNOSIS — M549 Dorsalgia, unspecified: Secondary | ICD-10-CM | POA: Insufficient documentation

## 2011-03-01 DIAGNOSIS — I4949 Other premature depolarization: Secondary | ICD-10-CM | POA: Insufficient documentation

## 2011-03-01 DIAGNOSIS — E119 Type 2 diabetes mellitus without complications: Secondary | ICD-10-CM | POA: Insufficient documentation

## 2011-03-01 DIAGNOSIS — K5900 Constipation, unspecified: Secondary | ICD-10-CM | POA: Insufficient documentation

## 2011-03-01 DIAGNOSIS — R0602 Shortness of breath: Secondary | ICD-10-CM | POA: Insufficient documentation

## 2011-03-01 DIAGNOSIS — R079 Chest pain, unspecified: Secondary | ICD-10-CM | POA: Insufficient documentation

## 2011-03-01 DIAGNOSIS — F319 Bipolar disorder, unspecified: Secondary | ICD-10-CM | POA: Insufficient documentation

## 2011-03-01 DIAGNOSIS — J4489 Other specified chronic obstructive pulmonary disease: Secondary | ICD-10-CM | POA: Insufficient documentation

## 2011-03-01 DIAGNOSIS — J449 Chronic obstructive pulmonary disease, unspecified: Secondary | ICD-10-CM | POA: Insufficient documentation

## 2011-03-01 LAB — URINE MICROSCOPIC-ADD ON

## 2011-03-01 LAB — URINALYSIS, ROUTINE W REFLEX MICROSCOPIC
Glucose, UA: NEGATIVE mg/dL
Ketones, ur: NEGATIVE mg/dL
Leukocytes, UA: NEGATIVE
Specific Gravity, Urine: 1.003 — ABNORMAL LOW (ref 1.005–1.030)
pH: 6.5 (ref 5.0–8.0)

## 2011-03-01 LAB — CBC
Hemoglobin: 13.4 g/dL (ref 12.0–15.0)
Platelets: 108 10*3/uL — ABNORMAL LOW (ref 150–400)
RBC: 4.02 MIL/uL (ref 3.87–5.11)
WBC: 5.9 10*3/uL (ref 4.0–10.5)

## 2011-03-01 LAB — DIFFERENTIAL
Basophils Absolute: 0 10*3/uL (ref 0.0–0.1)
Basophils Relative: 0 % (ref 0–1)
Eosinophils Absolute: 0.2 10*3/uL (ref 0.0–0.7)
Neutro Abs: 3.6 10*3/uL (ref 1.7–7.7)
Neutrophils Relative %: 60 % (ref 43–77)

## 2011-03-01 LAB — CK TOTAL AND CKMB (NOT AT ARMC)
CK, MB: 4.5 ng/mL — ABNORMAL HIGH (ref 0.3–4.0)
CK, MB: 4.4 ng/mL — ABNORMAL HIGH (ref 0.3–4.0)
Relative Index: 3.2 — ABNORMAL HIGH (ref 0.0–2.5)
Total CK: 124 U/L (ref 7–177)

## 2011-03-01 LAB — COMPREHENSIVE METABOLIC PANEL
ALT: 18 U/L (ref 0–35)
AST: 20 U/L (ref 0–37)
Calcium: 9.3 mg/dL (ref 8.4–10.5)
Creatinine, Ser: 1.54 mg/dL — ABNORMAL HIGH (ref 0.50–1.10)
GFR calc Af Amer: 43 mL/min — ABNORMAL LOW (ref >60)
GFR calc non Af Amer: 35 mL/min — ABNORMAL LOW (ref >60)
Sodium: 142 mEq/L (ref 135–145)
Total Protein: 6.6 g/dL (ref 6.0–8.3)

## 2011-03-01 LAB — TROPONIN I
Troponin I: <0.3 ng/mL (ref <0.30)
Troponin I: <0.3 ng/mL (ref <0.30)

## 2011-05-21 LAB — URINALYSIS, ROUTINE W REFLEX MICROSCOPIC
Bilirubin Urine: NEGATIVE
Glucose, UA: NEGATIVE
Hgb urine dipstick: NEGATIVE
Ketones, ur: NEGATIVE
Leukocytes, UA: NEGATIVE
Protein, ur: 30 — AB
pH: 6.5

## 2011-05-21 LAB — TROPONIN I: Troponin I: 0.03

## 2011-05-21 LAB — DIFFERENTIAL
Basophils Absolute: 0
Basophils Relative: 0
Lymphocytes Relative: 10 — ABNORMAL LOW
Neutro Abs: 6.4
Neutrophils Relative %: 83 — ABNORMAL HIGH

## 2011-05-21 LAB — COMPREHENSIVE METABOLIC PANEL
ALT: 47 — ABNORMAL HIGH
ALT: 65 — ABNORMAL HIGH
AST: 43 — ABNORMAL HIGH
Albumin: 2 — ABNORMAL LOW
BUN: 18
BUN: 23
CO2: 28
CO2: 29
CO2: 31
Calcium: 8.7
Calcium: 8.9
Calcium: 9
Chloride: 103
Chloride: 103
Creatinine, Ser: 1.22 — ABNORMAL HIGH
Creatinine, Ser: 1.27 — ABNORMAL HIGH
GFR calc Af Amer: 57 — ABNORMAL LOW
GFR calc Af Amer: >60
GFR calc non Af Amer: 46 — ABNORMAL LOW
GFR calc non Af Amer: 47 — ABNORMAL LOW
GFR calc non Af Amer: 60 — ABNORMAL LOW
Glucose, Bld: 100 — ABNORMAL HIGH
Sodium: 141
Sodium: 144
Total Bilirubin: 0.5
Total Bilirubin: 0.7
Total Bilirubin: 0.8
Total Protein: 6.1

## 2011-05-21 LAB — BASIC METABOLIC PANEL
BUN: 27 — ABNORMAL HIGH
CO2: 32
Calcium: 9.1
Chloride: 103
Creatinine, Ser: 1.16
GFR calc non Af Amer: 49 — ABNORMAL LOW
GFR calc non Af Amer: 50 — ABNORMAL LOW
Glucose, Bld: 105 — ABNORMAL HIGH
Glucose, Bld: 128 — ABNORMAL HIGH
Potassium: 3.7
Sodium: 138
Sodium: 141

## 2011-05-21 LAB — CBC
HCT: 31 — ABNORMAL LOW
HCT: 33.7 — ABNORMAL LOW
Hemoglobin: 10.6 — ABNORMAL LOW
Hemoglobin: 10.8 — ABNORMAL LOW
MCHC: 33.8
MCHC: 33.8
MCHC: 34
MCHC: 34.1
MCV: 94.5
MCV: 94.8
MCV: 95.3
MCV: 95.8
Platelets: 141 — ABNORMAL LOW
Platelets: 253
RBC: 3.3 — ABNORMAL LOW
RBC: 3.34 — ABNORMAL LOW
RBC: 3.52 — ABNORMAL LOW
RDW: 12.9
RDW: 12.9
WBC: 5.7
WBC: 6.3

## 2011-05-21 LAB — SEDIMENTATION RATE: Sed Rate: 83 — ABNORMAL HIGH

## 2011-05-21 LAB — MAGNESIUM: Magnesium: 2.1

## 2011-05-21 LAB — PHOSPHORUS: Phosphorus: 4.3

## 2011-05-21 LAB — HEMOGLOBIN A1C
Hgb A1c MFr Bld: 6.2 — ABNORMAL HIGH
Mean Plasma Glucose: 143

## 2011-05-21 LAB — CARDIAC PANEL(CRET KIN+CKTOT+MB+TROPI)
Relative Index: INVALID
Total CK: 56
Troponin I: 0.02

## 2011-05-21 LAB — URINE MICROSCOPIC-ADD ON

## 2011-05-21 LAB — CK TOTAL AND CKMB (NOT AT ARMC)
CK, MB: 4.9 — ABNORMAL HIGH
Total CK: 124

## 2011-05-21 LAB — URINE CULTURE: Colony Count: 8000

## 2011-05-21 LAB — CULTURE, BLOOD (ROUTINE X 2)

## 2011-05-21 LAB — VANCOMYCIN, TROUGH: Vancomycin Tr: 16.6

## 2011-05-21 LAB — CARBAMAZEPINE LEVEL, TOTAL: Carbamazepine Lvl: 8.8

## 2011-05-24 LAB — DIFFERENTIAL
Basophils Absolute: 0
Basophils Relative: 0
Eosinophils Relative: 3
Lymphocytes Relative: 24
Monocytes Absolute: 0.4
Monocytes Relative: 6

## 2011-05-24 LAB — URINALYSIS, ROUTINE W REFLEX MICROSCOPIC
Nitrite: NEGATIVE
Specific Gravity, Urine: 1.007
Urobilinogen, UA: 0.2

## 2011-05-24 LAB — COMPREHENSIVE METABOLIC PANEL
AST: 13
Albumin: 2.9 — ABNORMAL LOW
Alkaline Phosphatase: 113
Chloride: 103
GFR calc Af Amer: 54 — ABNORMAL LOW
Potassium: 3.9
Total Bilirubin: 0.5
Total Protein: 6.9

## 2011-05-24 LAB — RAPID URINE DRUG SCREEN, HOSP PERFORMED
Amphetamines: NOT DETECTED
Barbiturates: NOT DETECTED
Tetrahydrocannabinol: NOT DETECTED

## 2011-05-24 LAB — ETHANOL: Alcohol, Ethyl (B): <5

## 2011-05-24 LAB — CBC
Platelets: 184
WBC: 6.3

## 2011-05-24 LAB — HEPATIC FUNCTION PANEL
ALT: 11
Bilirubin, Direct: <0.1
Total Bilirubin: 0.5

## 2011-06-14 LAB — DIFFERENTIAL
Basophils Absolute: 0
Eosinophils Relative: 4
Lymphocytes Relative: 22
Lymphs Abs: 1.3
Monocytes Absolute: 0.5
Neutro Abs: 4

## 2011-06-14 LAB — BASIC METABOLIC PANEL
Calcium: 9
GFR calc Af Amer: >60
GFR calc non Af Amer: 51 — ABNORMAL LOW
Glucose, Bld: 118 — ABNORMAL HIGH
Potassium: 3.7
Sodium: 139

## 2011-06-14 LAB — RAPID URINE DRUG SCREEN, HOSP PERFORMED
Amphetamines: NOT DETECTED
Opiates: NOT DETECTED
Tetrahydrocannabinol: NOT DETECTED

## 2011-06-14 LAB — URINALYSIS, ROUTINE W REFLEX MICROSCOPIC
Bilirubin Urine: NEGATIVE
Nitrite: NEGATIVE
Specific Gravity, Urine: 1.006
Urobilinogen, UA: 0.2
pH: 6.5

## 2011-06-14 LAB — CBC
HCT: 35.6 — ABNORMAL LOW
Hemoglobin: 12
RBC: 3.75 — ABNORMAL LOW
RDW: 13.5
WBC: 6

## 2011-06-15 LAB — URINALYSIS, ROUTINE W REFLEX MICROSCOPIC
Bilirubin Urine: NEGATIVE
Glucose, UA: NEGATIVE
Ketones, ur: NEGATIVE
Nitrite: NEGATIVE
Specific Gravity, Urine: 1.007
pH: 6

## 2011-06-17 LAB — DIFFERENTIAL
Basophils Absolute: 0
Basophils Absolute: 0
Basophils Relative: 0
Basophils Relative: 0
Eosinophils Absolute: 0.2
Eosinophils Relative: 4
Lymphocytes Relative: 24
Monocytes Absolute: 0.5
Monocytes Absolute: 0.5
Monocytes Relative: 7
Monocytes Relative: 7
Neutro Abs: 5.2

## 2011-06-17 LAB — CBC
HCT: 35.7 — ABNORMAL LOW
Hemoglobin: 12
Hemoglobin: 12.2
MCHC: 33.5
MCHC: 33.7
MCV: 95.2
RBC: 3.74 — ABNORMAL LOW
RBC: 3.78 — ABNORMAL LOW
RDW: 13.1
RDW: 13

## 2011-06-17 LAB — BASIC METABOLIC PANEL
CO2: 30
Calcium: 9.1
GFR calc Af Amer: 48 — ABNORMAL LOW
GFR calc non Af Amer: 39 — ABNORMAL LOW
Glucose, Bld: 113 — ABNORMAL HIGH
Potassium: 3.9
Sodium: 144

## 2011-06-17 LAB — I-STAT 8, (EC8 V) (CONVERTED LAB)
BUN: 25 — ABNORMAL HIGH
Bicarbonate: 26.9 — ABNORMAL HIGH
Glucose, Bld: 182 — ABNORMAL HIGH
TCO2: 28
pCO2, Ven: 50.3 — ABNORMAL HIGH
pH, Ven: 7.337 — ABNORMAL HIGH

## 2011-06-17 LAB — RAPID URINE DRUG SCREEN, HOSP PERFORMED
Amphetamines: NOT DETECTED
Barbiturates: NOT DETECTED
Benzodiazepines: NOT DETECTED
Cocaine: NOT DETECTED
Opiates: NOT DETECTED

## 2011-06-17 LAB — TSH: TSH: 2.364

## 2011-06-17 LAB — CULTURE, BLOOD (ROUTINE X 2): Culture: NO GROWTH

## 2011-07-08 ENCOUNTER — Telehealth: Payer: Self-pay | Admitting: Oncology

## 2011-07-08 NOTE — Telephone Encounter (Signed)
S/w the pt and she is aware of her new pt appt with dr ha on 07/19/2011@3 :00pm

## 2011-07-10 ENCOUNTER — Encounter: Payer: Self-pay | Admitting: Oncology

## 2011-07-10 DIAGNOSIS — N189 Chronic kidney disease, unspecified: Secondary | ICD-10-CM | POA: Insufficient documentation

## 2011-07-10 DIAGNOSIS — D649 Anemia, unspecified: Secondary | ICD-10-CM

## 2011-07-10 DIAGNOSIS — D696 Thrombocytopenia, unspecified: Secondary | ICD-10-CM | POA: Insufficient documentation

## 2011-07-10 HISTORY — DX: Thrombocytopenia, unspecified: D69.6

## 2011-07-10 HISTORY — DX: Anemia, unspecified: D64.9

## 2011-07-19 ENCOUNTER — Other Ambulatory Visit: Payer: Self-pay | Admitting: Oncology

## 2011-07-19 ENCOUNTER — Ambulatory Visit (HOSPITAL_BASED_OUTPATIENT_CLINIC_OR_DEPARTMENT_OTHER): Payer: Medicare Other | Admitting: Oncology

## 2011-07-19 ENCOUNTER — Other Ambulatory Visit (HOSPITAL_BASED_OUTPATIENT_CLINIC_OR_DEPARTMENT_OTHER): Payer: Medicare Other

## 2011-07-19 ENCOUNTER — Ambulatory Visit: Payer: Medicare Other

## 2011-07-19 ENCOUNTER — Encounter: Payer: Self-pay | Admitting: Oncology

## 2011-07-19 ENCOUNTER — Telehealth: Payer: Self-pay | Admitting: Oncology

## 2011-07-19 DIAGNOSIS — D649 Anemia, unspecified: Secondary | ICD-10-CM

## 2011-07-19 DIAGNOSIS — E119 Type 2 diabetes mellitus without complications: Secondary | ICD-10-CM

## 2011-07-19 DIAGNOSIS — D696 Thrombocytopenia, unspecified: Secondary | ICD-10-CM

## 2011-07-19 DIAGNOSIS — E039 Hypothyroidism, unspecified: Secondary | ICD-10-CM

## 2011-07-19 DIAGNOSIS — E785 Hyperlipidemia, unspecified: Secondary | ICD-10-CM

## 2011-07-19 DIAGNOSIS — J449 Chronic obstructive pulmonary disease, unspecified: Secondary | ICD-10-CM

## 2011-07-19 DIAGNOSIS — F319 Bipolar disorder, unspecified: Secondary | ICD-10-CM

## 2011-07-19 DIAGNOSIS — N289 Disorder of kidney and ureter, unspecified: Secondary | ICD-10-CM

## 2011-07-19 DIAGNOSIS — N189 Chronic kidney disease, unspecified: Secondary | ICD-10-CM | POA: Insufficient documentation

## 2011-07-19 DIAGNOSIS — I10 Essential (primary) hypertension: Secondary | ICD-10-CM

## 2011-07-19 LAB — COMPREHENSIVE METABOLIC PANEL
ALT: 11 U/L (ref 0–35)
AST: 15 U/L (ref 0–37)
CO2: 28 mEq/L (ref 19–32)
Sodium: 138 mEq/L (ref 135–145)
Total Bilirubin: 0.1 mg/dL — ABNORMAL LOW (ref 0.3–1.2)
Total Protein: 6.3 g/dL (ref 6.0–8.3)

## 2011-07-19 LAB — CBC WITH DIFFERENTIAL/PLATELET
BASO%: 0.4 % (ref 0.0–2.0)
EOS%: 1.9 % (ref 0.0–7.0)
HCT: 40 % (ref 34.8–46.6)
LYMPH%: 31.6 % (ref 14.0–49.7)
MCH: 34.2 pg — ABNORMAL HIGH (ref 25.1–34.0)
MCHC: 34.2 g/dL (ref 31.5–36.0)
NEUT%: 59.4 % (ref 38.4–76.8)
Platelets: 122 10*3/uL — ABNORMAL LOW (ref 145–400)
lymph#: 2 10*3/uL (ref 0.9–3.3)

## 2011-07-19 NOTE — Progress Notes (Signed)
Referral MD:  Dr. Jackie Plum  Reason for Referral: thrombocytopenia.   HPI: [name] is a 50 yo Caucasian woman with numerous medical issues notable for bipolar, hypothyroidism, DM, HTN, HLP with chronic thrombocytopenia for years.  She was kindly referred to [place] due to potential worsening of her thrombocytopenia.  Looking at [place], her thrombocytopenia has been ongoing since 2009.  On 10/16/2010, her WBC 5.7; Hgb 11.4; plt 122.  Her PCP checked her CBC on 05/25/2011 which showed WBC 6; Hgb 14.1; Plt 105.  Given the progressive thrombocytopenia, she was referred.    She is here with a nursing home aid.  She reports feeling well except for chronic cough.  This has been going for years.  She still smokes cigarettes admitted this is an addiction. She had tried numerous ways to stop before without success.  She denies any bleeding symptoms such as epistaxis, hemoptysis, hematemesis, melena, hematochezia, vaginal bleeding, skin rash, and ecchymosis, petechiae.  She get she has good appetite and has stable weight.  She denies headache, vision changes, confusion,hulucination, hearing voices, homicidal/suicidal ideation, chest pain, palpitation, abdominal pain, diarrhea, constipation, bowel/bladder incontinence, lower back pain, lower extremities paresthesia.     Past Medical History  Diagnosis Date  . Anemia 07/10/2011  . Thrombocytopenia 07/10/2011  . HTN (hypertension)   . COPD (chronic obstructive pulmonary disease)   . DM (diabetes mellitus)   . Renal insufficiency   . Hyperlipidemia   . Hypothyroidism   . Bipolar affective   :    History reviewed. No pertinent past surgical history.:   CURRENT MEDS: Current Outpatient Prescriptions  Medication Sig Dispense Refill  . atorvastatin (LIPITOR) 40 MG tablet Take 40 mg by mouth at bedtime.        . carvedilol (COREG) 3.125 MG tablet Take 3.125 mg by mouth 2 (two) times daily with a meal.        . Cholecalciferol (VITAMIN D3) 1000 UNITS  CAPS Take 1 capsule by mouth daily.        . clonazePAM (KLONOPIN) 1 MG tablet Take 1 mg by mouth 2 (two) times daily. 1/2 tab in am and 1 at hs       . divalproex (DEPAKOTE) 500 MG DR tablet Take 500 mg by mouth 2 (two) times daily.        . fluticasone-salmeterol (ADVAIR HFA) 115-21 MCG/ACT inhaler Inhale 2 puffs into the lungs QID.        . haloperidol (HALDOL) 5 MG tablet Take 5 mg by mouth 2 (two) times daily. 1/2 tab in am and 1 tab at hs       . Ipratropium-Albuterol (COMBIVENT IN) Inhale 1 Inhaler into the lungs QID.        Marland Kitchen levothyroxine (SYNTHROID, LEVOTHROID) 75 MCG tablet Take 75 mcg by mouth daily.        . meloxicam (MOBIC) 7.5 MG tablet Take 7.5 mg by mouth daily.        . metFORMIN (GLUCOPHAGE) 500 MG tablet Take 500 mg by mouth daily with breakfast.        . multivitamin (RENA-VIT) TABS tablet Take 1 tablet by mouth daily.        Marland Kitchen omeprazole (PRILOSEC) 20 MG capsule Take 20 mg by mouth 2 (two) times daily.            Allergies  Allergen Reactions  . Doxycycline     REACTION: Unknown reaction  . Glipizide   . Oxybutynin Chloride     REACTION: Skin  rash, itching  . Penicillins Other (See Comments)    unknown  . Sulfonamide Derivatives     REACTION: Unknown reaction  . Tetracycline     REACTION: Unknown reaction  . Tolterodine Tartrate     REACTION: itching  :  Family History  Problem Relation Age of Onset  . Diabetes Mother   . Cancer Father   . HIV Brother   :  History   Social History  . Marital Status: Divorced    Spouse Name: N/A    Number of Children: N/A  . Years of Education: N/A   Occupational History  . Not on file.   Social History Main Topics  . Smoking status: Not on file  . Smokeless tobacco: Not on file  . Alcohol Use: No  . Drug Use:   . Sexually Active:    Other Topics Concern  . Not on file   Social History Narrative  . No narrative on file   She is smoking 1/2 ppd since 21 years.  She had tried numerous ways to stop  without any success. She is not in the mood to stop smoking today.   REVIEW OF SYSTEM:  The rest of the 14-point review of sytem was negative.   Exam:  General:  well-nourished in no acute distress.  Eyes:  no scleral icterus.  ENT:  There were no oropharyngeal lesions.  Neck was without thyromegaly.  Lymphatics:  Negative cervical, supraclavicular or axillary adenopathy.  Respiratory: lungs were clear bilaterally without wheezing or crackles.  Cardiovascular:  Regular rate and rhythm, S1/S2, without murmur, rub or gallop.  There was no pedal edema.  GI:  abdomen was soft, flat, nontender, nondistended, without organomegaly.  Muscoloskeletal:  no spinal tenderness of palpation of vertebral spine.  Skin exam was without echymosis, petichae.  Neuro exam was nonfocal.  Patient was able to get on and off exam table without assistance.  Gait was normal.  Patient was alerted and oriented.  Attention was good.   Language was appropriate.  Mood was normal without depression.  Speech was not pressured.  Thought content was not tangential.    LABS:   Basename 07/19/11 1600  WBC 6.3  HGB 13.7  HCT 40.0  PLT 122*    Basename 07/19/11 1537  NA 138  K 4.6  CL 102  CO2 28  GLUCOSE 108*  BUN 34*  CREATININE 1.98*  CALCIUM 9.8     Blood smear review:   I personally reviewed the patient's peripheral blood smear today.  There was isocytosis.  There was no peripheral blast.  There was no schistocytosis, spherocytosis, target cell, rouleaux formation, tear drop cell.  There was no giant platelets or platelet clumps.     ASSESSMENT AND PLAN:   1.  Schizoaffective disorder:  On Depakote per PCP. 2.  Hypothyroidism:  On Synthroid per PCP. 3.  Hyperlipidemia:  On Lipitor per PCP.  4.  COPD:  2/2 smoking;  Not ready emotional to stop despite education and attempts in the past. 5.  DM:  On metformin per PCP. 6.  HTN:  On metoprolol per PCP.  7.  Chronic thrombocytopenia:  Differential diagnosis:   Duet to Depakote vs. Hypothyroid vs. Lipitor use.  I have low concern for a primary bone marrow process such as MDS or myeloproliferative or lymphoproliferative state.  I also have low concern for ITP as this has been going on for many years.  Review of her smear shows no concern for TTP.  Recommendation:  She has appointment to return to the clinic here for lab only in 3 and in 6 months.  I will see her in 9 months.  If her thrombocytopenia worsens to less than 80 persistently, I may consider a diagnostic bone marrow biopsy.  At this time, a bone marrow biopsy as low clinical utility.  I advised her that spontaneous hemorrhage with plt count of >30K is rare. She expressed informed understanding and wished to proceed with stated plan of watchful observation.

## 2011-07-19 NOTE — Telephone Encounter (Signed)
gve the pt her feb,may,sept 2012 appt calendar

## 2011-07-21 LAB — KAPPA/LAMBDA LIGHT CHAINS
Kappa free light chain: 2.81 mg/dL — ABNORMAL HIGH (ref 0.33–1.94)
Kappa:Lambda Ratio: 0.94 (ref 0.26–1.65)

## 2011-07-21 LAB — VITAMIN B12: Vitamin B-12: 1134 pg/mL — ABNORMAL HIGH (ref 211–911)

## 2011-07-21 LAB — TSH: TSH: 1.622 u[IU]/mL (ref 0.350–4.500)

## 2011-07-21 LAB — PROTEIN ELECTROPHORESIS, SERUM
Albumin ELP: 55.3 % — ABNORMAL LOW (ref 55.8–66.1)
Beta 2: 6.2 % (ref 3.2–6.5)

## 2011-10-06 ENCOUNTER — Other Ambulatory Visit: Payer: Medicare Other | Admitting: Lab

## 2011-10-25 ENCOUNTER — Other Ambulatory Visit (HOSPITAL_BASED_OUTPATIENT_CLINIC_OR_DEPARTMENT_OTHER): Payer: Medicare Other | Admitting: Lab

## 2011-10-25 ENCOUNTER — Telehealth: Payer: Self-pay | Admitting: *Deleted

## 2011-10-25 DIAGNOSIS — D696 Thrombocytopenia, unspecified: Secondary | ICD-10-CM

## 2011-10-25 LAB — CBC WITH DIFFERENTIAL/PLATELET
BASO%: 0.4 % (ref 0.0–2.0)
EOS%: 1.9 % (ref 0.0–7.0)
LYMPH%: 35.9 % (ref 14.0–49.7)
MCH: 34.1 pg — ABNORMAL HIGH (ref 25.1–34.0)
MCHC: 33.5 g/dL (ref 31.5–36.0)
MCV: 101.8 fL — ABNORMAL HIGH (ref 79.5–101.0)
MONO#: 0.4 10*3/uL (ref 0.1–0.9)
MONO%: 6.8 % (ref 0.0–14.0)
Platelets: 128 10*3/uL — ABNORMAL LOW (ref 145–400)
RBC: 3.98 10*6/uL (ref 3.70–5.45)
WBC: 5.9 10*3/uL (ref 3.9–10.3)

## 2011-10-25 NOTE — Telephone Encounter (Signed)
Call from caregiver to confirm pt's lab appt  For today.

## 2011-10-26 ENCOUNTER — Telehealth: Payer: Self-pay | Admitting: *Deleted

## 2011-10-26 NOTE — Telephone Encounter (Signed)
Message copied by Wende Mott on Tue Oct 26, 2011 11:19 AM ------      Message from: HA, Raliegh Ip T      Created: Mon Oct 25, 2011  3:24 PM       Please call pt.  Her plt is still slightly low, but not worse than before.  Continue observation.  Thanks.

## 2011-10-26 NOTE — Telephone Encounter (Signed)
Attempting to call pt w/ lab results and Dr. Lodema Pilot message that Platelets still slightly low, but not worse than before.  Called home # listed and there was no answer and no answering service. Called # at [facility] and no answer w/ message stating "mail box is full."

## 2011-10-26 NOTE — Telephone Encounter (Signed)
Called [facility] and s/w Administrator, Jinny Sanders,  Pt apparently not available at this time.  Informed simply that labs are ok and next lab appt on 01/17/12.  Instructed to have pt call if any further questions.  She verbalized understanding.

## 2012-01-17 ENCOUNTER — Other Ambulatory Visit: Payer: Medicare Other

## 2012-03-21 ENCOUNTER — Emergency Department (HOSPITAL_COMMUNITY): Payer: Medicare Other

## 2012-03-21 ENCOUNTER — Encounter (HOSPITAL_COMMUNITY): Payer: Self-pay | Admitting: *Deleted

## 2012-03-21 ENCOUNTER — Emergency Department (HOSPITAL_COMMUNITY)
Admission: EM | Admit: 2012-03-21 | Discharge: 2012-03-21 | Disposition: A | Discharge status: Home / Self Care | Payer: Medicare Other | Attending: Emergency Medicine | Admitting: Emergency Medicine

## 2012-03-21 DIAGNOSIS — R062 Wheezing: Secondary | ICD-10-CM | POA: Insufficient documentation

## 2012-03-21 DIAGNOSIS — S8002XA Contusion of left knee, initial encounter: Secondary | ICD-10-CM

## 2012-03-21 DIAGNOSIS — M549 Dorsalgia, unspecified: Secondary | ICD-10-CM | POA: Insufficient documentation

## 2012-03-21 DIAGNOSIS — Y9210 Unspecified residential institution as the place of occurrence of the external cause: Secondary | ICD-10-CM | POA: Insufficient documentation

## 2012-03-21 DIAGNOSIS — J4489 Other specified chronic obstructive pulmonary disease: Secondary | ICD-10-CM | POA: Insufficient documentation

## 2012-03-21 DIAGNOSIS — W010XXA Fall on same level from slipping, tripping and stumbling without subsequent striking against object, initial encounter: Secondary | ICD-10-CM | POA: Insufficient documentation

## 2012-03-21 DIAGNOSIS — E119 Type 2 diabetes mellitus without complications: Secondary | ICD-10-CM | POA: Insufficient documentation

## 2012-03-21 DIAGNOSIS — S8000XA Contusion of unspecified knee, initial encounter: Secondary | ICD-10-CM | POA: Insufficient documentation

## 2012-03-21 DIAGNOSIS — W19XXXA Unspecified fall, initial encounter: Secondary | ICD-10-CM

## 2012-03-21 DIAGNOSIS — J449 Chronic obstructive pulmonary disease, unspecified: Secondary | ICD-10-CM | POA: Insufficient documentation

## 2012-03-21 DIAGNOSIS — I10 Essential (primary) hypertension: Secondary | ICD-10-CM | POA: Insufficient documentation

## 2012-03-21 DIAGNOSIS — T148XXA Other injury of unspecified body region, initial encounter: Secondary | ICD-10-CM

## 2012-03-21 MED ORDER — HYDROCODONE-ACETAMINOPHEN 5-325 MG PO TABS
1.0000 | ORAL_TABLET | Freq: Once | ORAL | Status: AC
  Administered 2012-03-21: 1 via ORAL
  Filled 2012-03-21: qty 1

## 2012-03-21 MED ORDER — IPRATROPIUM BROMIDE 0.02 % IN SOLN
0.5000 mg | Freq: Once | RESPIRATORY_TRACT | Status: AC
  Administered 2012-03-21: 0.5 mg via RESPIRATORY_TRACT
  Filled 2012-03-21: qty 2.5

## 2012-03-21 MED ORDER — IBUPROFEN 800 MG PO TABS
800.0000 mg | ORAL_TABLET | Freq: Once | ORAL | Status: DC
  Filled 2012-03-21: qty 1

## 2012-03-21 MED ORDER — ALBUTEROL SULFATE (5 MG/ML) 0.5% IN NEBU
5.0000 mg | INHALATION_SOLUTION | Freq: Once | RESPIRATORY_TRACT | Status: AC
  Administered 2012-03-21: 5 mg via RESPIRATORY_TRACT
  Filled 2012-03-21: qty 1

## 2012-03-21 NOTE — ED Notes (Signed)
Pt brought in via ems and taken to room 3. Per ems pt fell about 12 hours ago. Pt now c/o back and left knee pain.

## 2012-03-21 NOTE — ED Provider Notes (Signed)
History     CSN: [id number]  Arrival date & time 03/21/12  0246   First MD Initiated Contact with Patient 03/21/12 0354      Chief Complaint  Patient presents with  . Knee Pain  . Back Pain   HPI  History provided by the patient. Patient is a 51 year old female history of hypertension, hyperlipidemia, diabetes, COPD, current smoker who presents after a fall. Patient currently living in a group home and states that she was getting up out of bed to use bathroom and stumbled and tripped falling onto her left knee. Patient complains of left knee pains worse with movements. Patient states she was able to stand and walk some of the knee. She also complains of low back pain and soreness. Patient denies any other having LOC. Patient denies any chest pain, heart palpitations or shortness of breath. Patient denies any other complaints.    Past Medical History  Diagnosis Date  . Anemia 07/10/2011  . Thrombocytopenia 07/10/2011  . HTN (hypertension)   . COPD (chronic obstructive pulmonary disease)   . DM (diabetes mellitus)   . Renal insufficiency   . Hyperlipidemia   . Hypothyroidism   . Bipolar affective     History reviewed. No pertinent past surgical history.  Family History  Problem Relation Age of Onset  . Diabetes Mother   . Cancer Father   . HIV Brother     History  Substance Use Topics  . Smoking status: Not on file  . Smokeless tobacco: Not on file  . Alcohol Use: No    OB History    Grav Para Term Preterm Abortions TAB SAB Ect Mult Living                  Review of Systems  Constitutional: Negative for fever and chills.  Respiratory: Negative for cough and shortness of breath.   Cardiovascular: Negative for chest pain.  Musculoskeletal: Positive for back pain and joint swelling.  Neurological: Negative for dizziness, light-headedness and headaches.    Allergies  Doxycycline; Glipizide; Oxybutynin chloride; Penicillins; Sulfonamide derivatives;  Tetracycline; and Tolterodine tartrate  Home Medications   Current Outpatient Rx  Name Route Sig Dispense Refill  . ATORVASTATIN CALCIUM 40 MG PO TABS Oral Take 40 mg by mouth at bedtime.      Marland Kitchen CARVEDILOL 3.125 MG PO TABS Oral Take 3.125 mg by mouth 2 (two) times daily with a meal.      . VITAMIN D3 1000 UNITS PO CAPS Oral Take 1 capsule by mouth daily.      Marland Kitchen CLONAZEPAM 1 MG PO TABS Oral Take 1 mg by mouth 2 (two) times daily. 1/2 tab in am and 1 at hs     . DIVALPROEX SODIUM 500 MG PO TBEC Oral Take 500 mg by mouth 2 (two) times daily.      Marland Kitchen FLUTICASONE-SALMETEROL 115-21 MCG/ACT IN AERO Inhalation Inhale 2 puffs into the lungs QID.      Marland Kitchen HALOPERIDOL 5 MG PO TABS Oral Take 5 mg by mouth 2 (two) times daily. 1/2 tab in am and 1 tab at hs     . COMBIVENT IN Inhalation Inhale 1 Inhaler into the lungs QID.      Marland Kitchen LEVOTHYROXINE SODIUM 75 MCG PO TABS Oral Take 75 mcg by mouth daily.      . MELOXICAM 7.5 MG PO TABS Oral Take 7.5 mg by mouth daily.      Marland Kitchen METFORMIN HCL 500 MG PO TABS Oral  Take 500 mg by mouth daily with breakfast.      . RENA-VITE PO TABS Oral Take 1 tablet by mouth daily.      Marland Kitchen OMEPRAZOLE 20 MG PO CPDR Oral Take 20 mg by mouth 2 (two) times daily.        BP 115/74  Pulse 73  Temp 98.4 F (36.9 C) (Oral)  Resp 22  SpO2 92%  Physical Exam  Nursing note and vitals reviewed. Constitutional: She is oriented to person, place, and time. She appears well-developed and well-nourished. No distress.  HENT:  Head: Normocephalic and atraumatic.  Neck: Normal range of motion. Neck supple.       No cervical midline tenderness  Cardiovascular: Normal rate and regular rhythm.   Pulmonary/Chest: Effort normal. No respiratory distress. She has wheezes. She has no rales.  Abdominal: Soft. There is no tenderness. There is no rebound and no guarding.       Obese  Musculoskeletal:       Lumbar back: She exhibits tenderness.       Back:       Mild bruising to left knee. There is  also mild swelling and tenderness to palpation. Pain with range of motion. No gross deformities. No appreciable laxity with valgus rare stress. Negative anterior posterior drawer test. Normal distal sensation and pulses in feet.  Neurological: She is alert and oriented to person, place, and time.  Skin: Skin is warm and dry. No rash noted.  Psychiatric: She has a normal mood and affect. Her behavior is normal.    ED Course  Procedures   Dg Chest 2 View  03/21/2012  *RADIOLOGY REPORT*  Clinical Data: Wheezing.  History of asthma.  CHEST - 2 VIEW  Comparison: Chest x-ray 07/15/2010.  Findings: The the cardiac silhouette, mediastinal and hilar contours are within normal limits and stable.  There is mild peribronchial thickening and slight increased interstitial markings which likely reflects reactive airways disease.  No infiltrates, edema or effusions.  IMPRESSION:  Mild bronchitic changes but no infiltrates or effusions.  Original Report Authenticated By: P. Loralie Champagne, M.D.   Dg Lumbar Spine Complete  03/21/2012  *RADIOLOGY REPORT*  Clinical Data: Larey Seat.  Back pain.  LUMBAR SPINE - COMPLETE 4+ VIEW  Comparison: None  Findings: The lumbar vertebral bodies are normally aligned.  No acute fracture.  Mild degenerative changes.  No definite pars defects.  The visualized bony pelvis is grossly intact.  Mild SI joint degenerative changes are noted.  IMPRESSION:  Mild degenerative changes but normal alignment and no acute bony findings.  Original Report Authenticated By: P. Loralie Champagne, M.D.   Dg Knee Complete 4 Views Left  03/21/2012  *RADIOLOGY REPORT*  Clinical Data: Knee pain.  LEFT KNEE - COMPLETE 4+ VIEW  Comparison: None  Findings: There are mild degenerative changes but no acute bony findings or osteochondral abnormality.  A small joint effusion is suspected.  IMPRESSION:  1.  Mild degenerative changes but no acute bony findings. 2.  Small joint effusion.  Original Report Authenticated By: P.  Loralie Champagne, M.D.     1. Fall   2. Contusion of knee, left   3. Muscle strain       MDM  Patient seen and evaluated. Patient no acute distress. Patient was found to have borderline O2 saturations around 90% and placed placed on 2 L nasal cannula. Patient has history of COPD and is a heavy persistent smoker. Patient denies any shortness of breath symptoms at this  time.  Patient with wheezing on lung exam. Will offer breathing treatment here. Patient has been using her home breathing medications as instructed. She denies any shortness of breath or difficulty breathing. Patient's O2 saturation is most likely baseline for her.   X-rays unremarkable for any significant injuries.        Angus Seller, PA 03/21/12 2200

## 2012-03-21 NOTE — ED Notes (Signed)
[facility] called to transport pt home.  

## 2012-03-21 NOTE — ED Notes (Signed)
[id number]<VO> Expected date:<BR> Expected time:<BR> Means of arrival:<BR> Comments:<BR> EMS/from Group Home-fall

## 2012-03-21 NOTE — ED Notes (Signed)
[facility] in ED to transport pt home.

## 2012-03-22 NOTE — ED Provider Notes (Signed)
Medical screening examination/treatment/procedure(s) were performed by non-physician practitioner and as supervising physician I was immediately available for consultation/collaboration.  Olivia Mackie, MD 03/22/12 [phone]

## 2012-05-04 ENCOUNTER — Other Ambulatory Visit (HOSPITAL_BASED_OUTPATIENT_CLINIC_OR_DEPARTMENT_OTHER): Payer: Medicare Other

## 2012-05-04 ENCOUNTER — Ambulatory Visit (HOSPITAL_BASED_OUTPATIENT_CLINIC_OR_DEPARTMENT_OTHER): Payer: Medicare Other | Admitting: Oncology

## 2012-05-04 ENCOUNTER — Telehealth: Payer: Self-pay | Admitting: Oncology

## 2012-05-04 VITALS — BP 131/87 | HR 84 | Temp 97.4°F | Resp 20 | Ht 65.5 in | Wt 243.8 lb

## 2012-05-04 DIAGNOSIS — D696 Thrombocytopenia, unspecified: Secondary | ICD-10-CM

## 2012-05-04 DIAGNOSIS — D649 Anemia, unspecified: Secondary | ICD-10-CM

## 2012-05-04 DIAGNOSIS — J449 Chronic obstructive pulmonary disease, unspecified: Secondary | ICD-10-CM

## 2012-05-04 DIAGNOSIS — Z9181 History of falling: Secondary | ICD-10-CM

## 2012-05-04 LAB — CBC WITH DIFFERENTIAL/PLATELET
Basophils Absolute: 0 10*3/uL (ref 0.0–0.1)
EOS%: 2.3 % (ref 0.0–7.0)
HGB: 13.2 g/dL (ref 11.6–15.9)
LYMPH%: 36.4 % (ref 14.0–49.7)
MCH: 34.5 pg — ABNORMAL HIGH (ref 25.1–34.0)
MCV: 101 fL (ref 79.5–101.0)
MONO%: 7.6 % (ref 0.0–14.0)
Platelets: 106 10*3/uL — ABNORMAL LOW (ref 145–400)
RBC: 3.83 10*6/uL (ref 3.70–5.45)
RDW: 14.3 % (ref 11.2–14.5)

## 2012-05-04 LAB — COMPREHENSIVE METABOLIC PANEL (CC13)
Alkaline Phosphatase: 70 U/L (ref 40–150)
Creatinine: 1.9 mg/dL — ABNORMAL HIGH (ref 0.6–1.1)
Glucose: 97 mg/dl (ref 70–99)
Sodium: 142 mEq/L (ref 136–145)
Total Bilirubin: 0.3 mg/dL (ref 0.20–1.20)
Total Protein: 5.9 g/dL — ABNORMAL LOW (ref 6.4–8.3)

## 2012-05-04 NOTE — Telephone Encounter (Signed)
appts made and printed for pt aom °

## 2012-05-04 NOTE — Patient Instructions (Addendum)
A.  Diagnosis:  Thrombocytopenia (low platelet count) B.  Potential causes:  Depakote vs. Hypothyroid vs. Lipitor use. At this time, I still have low concern for a primary bone marrow failure process due to stability and chronicity of this problem.   C.  Recommendation:  Watchful observation.  A bone marrow biopsy at this time has low clinical utility.  In the future, if your platelet count continues to decrease to <90 or also with significant anemia and low white blood cell, then, a bone marrow biopsy will be appropriate.  D.  Follow up:  Lab in 3, 6, and 9 months.  Follow up in about 1 year.

## 2012-05-05 NOTE — Progress Notes (Signed)
[facility]  [phone] [phone]   OFFICE PROGRESS NOTE   Cc:  OSEI-BONSU,GEORGE, MD  DIAGNOSIS: mild, chronic thrombocytopenia due to medications.   CURRENT THERAPY:  Watchful observation.   INTERVAL HISTORY: [name] 51 y.o. female returns for regular follow up along with a care facility personnel.  Ms. Skeens reported doing well.  She denied anorexia, fever, weight loss, visible source of bleeding, recurrent infections. She said that she has had a few fall since she visited with me last year.  Before each fall, she denied SOB, chest pain, dizziness, palpitation.  She did not have incontinence or loss of consciousness with each fall.  She thinks that her gait is just a little clumpsy compared to before.  She denied leg weakness, paresthesia, back pain, bowel/bladder incontinence. She thinks that her mood is stable.   Past Medical History  Diagnosis Date  . Anemia 07/10/2011  . Thrombocytopenia 07/10/2011  . HTN (hypertension)   . COPD (chronic obstructive pulmonary disease)   . DM (diabetes mellitus)   . Renal insufficiency   . Hyperlipidemia   . Hypothyroidism   . Bipolar affective     No past surgical history on file.  Current Outpatient Prescriptions  Medication Sig Dispense Refill  . atorvastatin (LIPITOR) 40 MG tablet Take 40 mg by mouth at bedtime.        . carvedilol (COREG) 3.125 MG tablet Take 3.125 mg by mouth 2 (two) times daily with a meal.        . Cholecalciferol (VITAMIN D3) 1000 UNITS CAPS Take 1 capsule by mouth daily.        . clonazePAM (KLONOPIN) 1 MG tablet Take 1 mg by mouth 2 (two) times daily. 1/2 tab in am and 1 at hs       . divalproex (DEPAKOTE) 500 MG DR tablet Take 500 mg by mouth 2 (two) times daily.        . fluticasone-salmeterol (ADVAIR HFA) 115-21 MCG/ACT inhaler Inhale 2 puffs into the lungs QID.        . haloperidol (HALDOL) 5 MG tablet Take 5 mg by mouth 2 (two) times daily. 1/2 tab in am  and 1 tab at hs       . Ipratropium-Albuterol (COMBIVENT IN) Inhale 1 Inhaler into the lungs QID.        Marland Kitchen levothyroxine (SYNTHROID, LEVOTHROID) 75 MCG tablet Take 75 mcg by mouth daily.        . metFORMIN (GLUCOPHAGE) 500 MG tablet Take 500 mg by mouth daily with breakfast.        . multivitamin (RENA-VIT) TABS tablet Take 1 tablet by mouth daily.        Marland Kitchen omeprazole (PRILOSEC) 20 MG capsule Take 20 mg by mouth 2 (two) times daily.        . ramipril (ALTACE) 2.5 MG capsule         ALLERGIES:  is allergic to doxycycline; glipizide; ibuprofen; oxybutynin chloride; penicillins; sulfonamide derivatives; tetracycline; and tolterodine tartrate.  REVIEW OF SYSTEMS:  The rest of the 14-point review of system was negative.   Filed Vitals:   05/04/12 1515  BP: 131/87  Pulse: 84  Temp: 97.4 F (36.3 C)  Resp: 20   Wt Readings from Last 3 Encounters:  05/04/12 243 lb 12.8 oz (110.587 kg)  07/19/11 254 lb 6.4 oz (115.395 kg)   ECOG Performance status: 0  PHYSICAL EXAMINATION:   General:  Mildly obese woman, in no acute distress.  Eyes:  no scleral icterus.  ENT:  There were no oropharyngeal lesions.  She was edentulous.  Neck was without thyromegaly.  Lymphatics:  Negative cervical, supraclavicular or axillary adenopathy.  Respiratory: lungs were clear bilaterally without wheezing or crackles.  Cardiovascular:  Regular rate and rhythm, S1/S2, without murmur, rub or gallop.  There was no pedal edema.  GI:  abdomen was soft, flat, nontender, nondistended, without organomegaly.  Muscoloskeletal:  no spinal tenderness of palpation of vertebral spine.  Skin exam was without echymosis, petichae.  Neuro exam was nonfocal.  Patient was able to get on and off exam table without assistance.  Gait was normal.  She had no problem initiating steps, stopping, or turning.  Patient was alerted.  Attention was good.   Language was appropriate.  Mood was normal without depression.  Speech was not pressured.  Thought  content was not tangential.    LABORATORY/RADIOLOGY DATA:  Lab Results  Component Value Date   WBC 5.3 05/04/2012   HGB 13.2 05/04/2012   HCT 38.7 05/04/2012   PLT 106* 05/04/2012   GLUCOSE 97 05/04/2012   ALKPHOS 70 05/04/2012   ALT 11 05/04/2012   AST 16 05/04/2012   NA 142 05/04/2012   K 4.1 05/04/2012   CL 106 05/04/2012   CREATININE 1.9* 05/04/2012   BUN 33.0* 05/04/2012   CO2 26 05/04/2012   HGBA1C  Value: 5.8 (NOTE)                                                                       According to the ADA Clinical Practice Recommendations for 2011, when HbA1c is used as a screening test:   >=6.5%   Diagnostic of Diabetes Mellitus           (if abnormal result  is confirmed)  5.7-6.4%   Increased risk of developing Diabetes Mellitus  References:Diagnosis and Classification of Diabetes Mellitus,Diabetes Care,2011,34(Suppl 1):S62-S69 and Standards of Medical Care in         Diabetes - 2011,Diabetes Care,2011,34  (Suppl 1):S11-S61.* 02/13/2010     ASSESSMENT AND PLAN:   1. Schizoaffective disorder: On Depakote per PCP.  2. Hypothyroidism: On Synthroid per PCP.  3. Hyperlipidemia: On Lipitor per PCP.  4. COPD: 2/2 smoking; Not ready emotional to stop despite education and attempts in the past.  We again talked about this today.  I recommended to stop cold [place].  She wanted to try electronic cigarettes.  5. DM: On metformin per PCP.  6. HTN: On metoprolol per PCP.  7.  Falls:  Unclear etiology.  From her description, it did not appear like cardiac or CNS in origin.  Exam today was non remarkable with normal gait.  I advised patient to bring this issue up to her PCP if it recurs again.  7. Chronic thrombocytopenia:  A.  Diagnosis:  Thrombocytopenia, NOS.  B.  Potential causes:  Depakote vs. Hypothyroid vs. Lipitor use. At this time, I still have low concern for a primary bone marrow failure process due to stability and chronicity of this problem.   C.  Recommendation:  Watchful observation.  A bone marrow  biopsy at this time has low clinical utility.  In the future, if your platelet count continues to decrease  to <90 or also with significant anemia and low white blood cell, then, a bone marrow biopsy will be appropriate.  D.  Follow up:  Lab in 3, 6, and 9 months.  Follow up in about 1 year.    The length of time of the face-to-face encounter was 10  minutes. More than 50% of time was spent counseling and coordination of care.

## 2012-05-08 ENCOUNTER — Emergency Department (HOSPITAL_COMMUNITY)
Admission: EM | Admit: 2012-05-08 | Discharge: 2012-05-09 | Disposition: A | Discharge status: Home / Self Care | Payer: Medicare Other | Attending: Emergency Medicine | Admitting: Emergency Medicine

## 2012-05-08 ENCOUNTER — Encounter (HOSPITAL_COMMUNITY): Payer: Self-pay

## 2012-05-08 DIAGNOSIS — T148XXA Other injury of unspecified body region, initial encounter: Secondary | ICD-10-CM | POA: Insufficient documentation

## 2012-05-08 DIAGNOSIS — Z880 Allergy status to penicillin: Secondary | ICD-10-CM | POA: Insufficient documentation

## 2012-05-08 DIAGNOSIS — Z881 Allergy status to other antibiotic agents status: Secondary | ICD-10-CM | POA: Insufficient documentation

## 2012-05-08 DIAGNOSIS — E119 Type 2 diabetes mellitus without complications: Secondary | ICD-10-CM | POA: Insufficient documentation

## 2012-05-08 DIAGNOSIS — Z882 Allergy status to sulfonamides status: Secondary | ICD-10-CM | POA: Insufficient documentation

## 2012-05-08 DIAGNOSIS — Z8489 Family history of other specified conditions: Secondary | ICD-10-CM | POA: Insufficient documentation

## 2012-05-08 DIAGNOSIS — I10 Essential (primary) hypertension: Secondary | ICD-10-CM | POA: Insufficient documentation

## 2012-05-08 DIAGNOSIS — Z888 Allergy status to other drugs, medicaments and biological substances status: Secondary | ICD-10-CM | POA: Insufficient documentation

## 2012-05-08 DIAGNOSIS — Z833 Family history of diabetes mellitus: Secondary | ICD-10-CM | POA: Insufficient documentation

## 2012-05-08 DIAGNOSIS — Z809 Family history of malignant neoplasm, unspecified: Secondary | ICD-10-CM | POA: Insufficient documentation

## 2012-05-08 DIAGNOSIS — W19XXXA Unspecified fall, initial encounter: Secondary | ICD-10-CM | POA: Insufficient documentation

## 2012-05-08 DIAGNOSIS — F319 Bipolar disorder, unspecified: Secondary | ICD-10-CM | POA: Insufficient documentation

## 2012-05-08 NOTE — ED Notes (Signed)
Pt states she had a fall last week at her Group Home and states she was on the [facility] bus today and he was going over some bumps and states it made her lower back hurt.  States she also has pain in both of her knees that has been a chronic pain.

## 2012-05-09 ENCOUNTER — Emergency Department (HOSPITAL_COMMUNITY): Payer: Medicare Other

## 2012-05-09 LAB — GLUCOSE, CAPILLARY: Glucose-Capillary: 91 mg/dL (ref 70–99)

## 2012-05-09 MED ORDER — ACETAMINOPHEN 325 MG PO TABS
650.0000 mg | ORAL_TABLET | Freq: Once | ORAL | Status: AC
  Administered 2012-05-09: 650 mg via ORAL
  Filled 2012-05-09: qty 2

## 2012-05-09 MED ORDER — CYCLOBENZAPRINE HCL 10 MG PO TABS: 10.0000 mg | ORAL_TABLET | Freq: Two times a day (BID) | ORAL | Status: AC | PRN

## 2012-05-09 MED ORDER — ACETAMINOPHEN 325 MG PO TABS: 325.0000 mg | ORAL_TABLET | Freq: Four times a day (QID) | ORAL | Status: AC | PRN

## 2012-05-09 NOTE — ED Provider Notes (Signed)
History     CSN: [id number]  Arrival date & time 05/08/12  2212   First MD Initiated Contact with Patient 05/09/12 0159      Chief Complaint  Patient presents with  . Back Pain    (Consider location/radiation/quality/duration/timing/severity/associated sxs/prior treatment) HPI  Patient presents to the emergency department from her group home activities headache. Couple of days ago the patient fell and was evaluated here at [facility] and sent home without any acute injuries. She states that she is still having pain in her left knee which was x-rayed and it was normal. She also has problems with chronic back pain in endorses her muscles being sore. She says that she has not taken any medication for it because she is "afraid of being addicted to that stuff". Patient has a history of diabetes, hypertension, bipolar, hypothyroidism. VSS/NAD. She declines pain medications.  Past Medical History  Diagnosis Date  . Anemia 07/10/2011  . Thrombocytopenia 07/10/2011  . HTN (hypertension)   . COPD (chronic obstructive pulmonary disease)   . DM (diabetes mellitus)   . Renal insufficiency   . Hyperlipidemia   . Hypothyroidism   . Bipolar affective     History reviewed. No pertinent past surgical history.  Family History  Problem Relation Age of Onset  . Diabetes Mother   . Cancer Father   . HIV Brother     History  Substance Use Topics  . Smoking status: Not on file  . Smokeless tobacco: Not on file  . Alcohol Use: No    OB History    Grav Para Term Preterm Abortions TAB SAB Ect Mult Living                  Review of Systems  Review of Systems  Gen: no weight loss, fevers, chills, night sweats  Eyes: no discharge or drainage, no occular pain or visual changes  Nose: no epistaxis or rhinorrhea  Mouth: no dental pain, no sore throat  Neck: no neck pain  Lungs:No wheezing, coughing or hemoptysis CV: no chest pain, palpitations, dependent edema or orthopnea  Abd: no  abdominal pain, nausea, vomiting  GU: no dysuria or gross hematuria  MSK:  + back pain and left knee pain  Neuro: no headache, no focal neurologic deficits  Skin: no abnormalities Psyche: negative.   Allergies  Doxycycline; Glipizide; Ibuprofen; Oxybutynin chloride; Penicillins; Sulfonamide derivatives; Tetracycline; and Tolterodine tartrate  Home Medications   Current Outpatient Rx  Name Route Sig Dispense Refill  . ATORVASTATIN CALCIUM 40 MG PO TABS Oral Take 40 mg by mouth at bedtime.      Marland Kitchen VITAMIN D3 1000 UNITS PO CAPS Oral Take 1 capsule by mouth daily.      Marland Kitchen CLONAZEPAM 1 MG PO TABS Oral Take 1 mg by mouth every evening. Evening    . DIVALPROEX SODIUM 250 MG PO TBEC Oral Take 750 mg by mouth 2 (two) times daily.    Marland Kitchen FLUTICASONE-SALMETEROL 250-50 MCG/DOSE IN AEPB Inhalation Inhale 1 puff into the lungs every 12 (twelve) hours.    Marland Kitchen HALOPERIDOL 5 MG PO TABS Oral Take 5 mg by mouth 2 (two) times daily. 1/2 tab in am and 1 tab at hs     . COMBIVENT IN Inhalation Inhale 1 Inhaler into the lungs QID.      Marland Kitchen LEVOTHYROXINE SODIUM 75 MCG PO TABS Oral Take 75 mcg by mouth daily.      Marland Kitchen METFORMIN HCL 500 MG PO TABS Oral Take  500 mg by mouth daily with breakfast.      . RENA-VITE PO TABS Oral Take 1 tablet by mouth daily.      Marland Kitchen OMEPRAZOLE 20 MG PO CPDR Oral Take 20 mg by mouth 2 (two) times daily.      Marland Kitchen RAMIPRIL 2.5 MG PO CAPS Oral Take 2.5 mg by mouth daily.     . ACETAMINOPHEN 325 MG PO TABS Oral Take 1 tablet (325 mg total) by mouth every 6 (six) hours as needed for pain. 30 tablet 0  . CYCLOBENZAPRINE HCL 10 MG PO TABS Oral Take 1 tablet (10 mg total) by mouth 2 (two) times daily as needed for muscle spasms. 20 tablet 0    BP 132/84  Pulse 82  Temp 98.5 F (36.9 C) (Oral)  Resp 20  Ht 5' 5.5" (1.664 m)  Wt 248 lb (112.492 kg)  BMI 40.64 kg/m2  SpO2 95%  Physical Exam  Nursing note and vitals reviewed. Constitutional: She appears well-developed and well-nourished. No  distress.  HENT:  Head: Normocephalic and atraumatic.  Eyes: Pupils are equal, round, and reactive to light.  Neck: Normal range of motion. Neck supple.  Cardiovascular: Normal rate and regular rhythm.   Pulmonary/Chest: Effort normal.  Abdominal: Soft.  Musculoskeletal:       Left knee: She exhibits swelling and ecchymosis. She exhibits normal range of motion, no effusion, no deformity, no laceration, no erythema, normal alignment and no LCL laxity. tenderness (all over) found.        Equal strength to bilateral lower extremities. Neurosensory  function adequate to both legs. Skin color is normal. Skin is warm and moist. I see no step off deformity, no bony tenderness. Pt is able to ambulate without limp. Pain is relieved when sitting in certain positions. ROM is decreased due to pain. No crepitus, laceration, effusion, swelling.  Pulses are normal   Neurological: She is alert.  Skin: Skin is warm and dry.    ED Course  Procedures (including critical care time)   Labs Reviewed  GLUCOSE, CAPILLARY   Dg Knee Complete 4 Views Left  05/09/2012  *RADIOLOGY REPORT*  Clinical Data: Knee pain after fall 3 days ago.  LEFT KNEE - COMPLETE 4+ VIEW  Comparison: 03/21/2012  Findings: Degenerative changes in the left knee with medial compartment narrowing and mild hypertrophic changes.  Benign- appearing sclerosis in the proximal tibial metaphysis.  No evidence of acute fracture or subluxation.  No focal bone lesion or bone destruction.  Bone cortex and trabecular architecture appear intact.  No significant effusion.  No significant change since previous study.  IMPRESSION: Degenerative changes.  No acute bony abnormalities.   Original Report Authenticated By: Marlon Pel, M.D.      1. Muscle strain       MDM  Patient with back pain. No neurological deficits. Patient is ambulatory. No warning symptoms of back pain including: loss of bowel or bladder control, night sweats, waking from  sleep with back pain, unexplained fevers or weight loss, h/o cancer, IVDU, recent trauma. No concern for cauda equina, epidural abscess, or other serious cause of back pain. Conservative measures such as rest, ice/heat and pain medicine indicated with PCP follow-up if no improvement with conservative management.    pts back pain is chronic and not changed. Repeat knee xray shows no boney abnormalities. Referral to ortho given as well as an Rx for muscle relaxers and tylenol.  Pt has been advised of the symptoms that warrant their  return to the ED. Patient has voiced understanding and has agreed to follow-up with the PCP or specialist.\         Dorthula Matas, PA 05/09/12 0410

## 2012-05-09 NOTE — ED Provider Notes (Signed)
Medical screening examination/treatment/procedure(s) were performed by non-physician practitioner and as supervising physician I was immediately available for consultation/collaboration.    Nelia Shi, MD 05/09/12 [phone]

## 2012-07-10 ENCOUNTER — Encounter (HOSPITAL_COMMUNITY): Payer: Self-pay | Admitting: Emergency Medicine

## 2012-07-10 ENCOUNTER — Emergency Department (HOSPITAL_COMMUNITY)
Admission: EM | Admit: 2012-07-10 | Discharge: 2012-07-10 | Disposition: A | Discharge status: Home / Self Care | Payer: Medicare Other | Attending: Emergency Medicine | Admitting: Emergency Medicine

## 2012-07-10 ENCOUNTER — Emergency Department (HOSPITAL_COMMUNITY): Payer: Medicare Other

## 2012-07-10 DIAGNOSIS — J4489 Other specified chronic obstructive pulmonary disease: Secondary | ICD-10-CM | POA: Insufficient documentation

## 2012-07-10 DIAGNOSIS — F319 Bipolar disorder, unspecified: Secondary | ICD-10-CM | POA: Insufficient documentation

## 2012-07-10 DIAGNOSIS — L02619 Cutaneous abscess of unspecified foot: Secondary | ICD-10-CM | POA: Insufficient documentation

## 2012-07-10 DIAGNOSIS — I129 Hypertensive chronic kidney disease with stage 1 through stage 4 chronic kidney disease, or unspecified chronic kidney disease: Secondary | ICD-10-CM | POA: Insufficient documentation

## 2012-07-10 DIAGNOSIS — Z862 Personal history of diseases of the blood and blood-forming organs and certain disorders involving the immune mechanism: Secondary | ICD-10-CM | POA: Insufficient documentation

## 2012-07-10 DIAGNOSIS — E039 Hypothyroidism, unspecified: Secondary | ICD-10-CM | POA: Insufficient documentation

## 2012-07-10 DIAGNOSIS — J449 Chronic obstructive pulmonary disease, unspecified: Secondary | ICD-10-CM | POA: Insufficient documentation

## 2012-07-10 DIAGNOSIS — E1129 Type 2 diabetes mellitus with other diabetic kidney complication: Secondary | ICD-10-CM | POA: Insufficient documentation

## 2012-07-10 DIAGNOSIS — E785 Hyperlipidemia, unspecified: Secondary | ICD-10-CM | POA: Insufficient documentation

## 2012-07-10 DIAGNOSIS — L0390 Cellulitis, unspecified: Secondary | ICD-10-CM

## 2012-07-10 DIAGNOSIS — N289 Disorder of kidney and ureter, unspecified: Secondary | ICD-10-CM | POA: Insufficient documentation

## 2012-07-10 DIAGNOSIS — Z79899 Other long term (current) drug therapy: Secondary | ICD-10-CM | POA: Insufficient documentation

## 2012-07-10 LAB — GLUCOSE, CAPILLARY: Glucose-Capillary: 72 mg/dL (ref 70–99)

## 2012-07-10 MED ORDER — CLINDAMYCIN HCL 150 MG PO CAPS: 450.0000 mg | ORAL_CAPSULE | Freq: Three times a day (TID) | ORAL | Status: DC

## 2012-07-10 NOTE — ED Notes (Signed)
Pt sts possible infection to chronic wound on left foot; pt sts bloody/purulent drainage x several days

## 2012-07-10 NOTE — ED Notes (Signed)
Patient up to restroom with NAD.  

## 2012-07-10 NOTE — ED Notes (Addendum)
Patient states she was seen by pediatrist on October 21 and the callus on the left great toe was trimmed down. Patient left foot is red in appearance and warm to touch and is swollen and appears to he red at the incision area. Patient denies N/F. Patient states her pain is worse x 2 days.

## 2012-07-10 NOTE — ED Provider Notes (Signed)
History     CSN: [id number]  Arrival date & time 07/10/12  1223   First MD Initiated Contact with Patient 07/10/12 1430      Chief Complaint  Patient presents with  . Foot Pain    (Consider location/radiation/quality/duration/timing/severity/associated sxs/prior treatment) HPI Comments: Patient with a history of a chronic callus on her left foot presents today because she feels that the area appears to be infected.  She states that she started noticing purulent drainage and blood coming from the wound 2 days ago.  Callus is located on the bottom of the foot near the first MTP.  She has also noticed some erythema and warmth surrounding the callus over the past 2 days.  She denies any fever or chills.  She has full ROM of the first MTP and the rest of her toes.  She is able to ambulate, but has some pain when she puts pressure on the ball of her foot.  She denies any pain of the left calcaneous or left ankle.  She denies numbness or tingling.  She is diabetic.  She reports that her blood sugar has been running between 65-115.    Patient is a 51 y.o. female presenting with lower extremity pain. The history is provided by the patient.  Foot Pain Pertinent negatives include no chills, fever, nausea, numbness or vomiting.    Past Medical History  Diagnosis Date  . Anemia 07/10/2011  . Thrombocytopenia 07/10/2011  . HTN (hypertension)   . COPD (chronic obstructive pulmonary disease)   . DM (diabetes mellitus)   . Renal insufficiency   . Hyperlipidemia   . Hypothyroidism   . Bipolar affective     History reviewed. No pertinent past surgical history.  Family History  Problem Relation Age of Onset  . Diabetes Mother   . Cancer Father   . HIV Brother     History  Substance Use Topics  . Smoking status: Not on file  . Smokeless tobacco: Not on file  . Alcohol Use: No    OB History    Grav Para Term Preterm Abortions TAB SAB Ect Mult Living                  Review of  Systems  Constitutional: Negative for fever and chills.  Gastrointestinal: Negative for nausea and vomiting.  Musculoskeletal:       Pain with ambulation  Skin: Positive for color change and wound.  Neurological: Negative for numbness.  All other systems reviewed and are negative.    Allergies  Doxycycline; Glipizide; Ibuprofen; Oxybutynin chloride; Penicillins; Sulfonamide derivatives; Tetracycline; and Tolterodine tartrate  Home Medications   Current Outpatient Rx  Name  Route  Sig  Dispense  Refill  . ATORVASTATIN CALCIUM 40 MG PO TABS   Oral   Take 40 mg by mouth at bedtime.          Marland Kitchen CARVEDILOL 3.125 MG PO TABS   Oral   Take 3.125 mg by mouth daily.         Marland Kitchen VITAMIN D3 1000 UNITS PO CAPS   Oral   Take 1,000 Units by mouth daily.          Marland Kitchen CLONAZEPAM 0.5 MG PO TABS   Oral   Take 0.5 mg by mouth at bedtime.         . CYCLOBENZAPRINE HCL 10 MG PO TABS   Oral   Take 10 mg by mouth 2 (two) times daily as needed. For muscle spasms         .  DIVALPROEX SODIUM 250 MG PO TBEC   Oral   Take 750 mg by mouth 2 (two) times daily.         Marland Kitchen HALOPERIDOL 5 MG PO TABS   Oral   Take 2.5-5 mg by mouth 2 (two) times daily. 1 tab in am and 1/2 tab at hs         . LEVOTHYROXINE SODIUM 75 MCG PO TABS   Oral   Take 75 mcg by mouth daily.           Marland Kitchen METFORMIN HCL 500 MG PO TABS   Oral   Take 500 mg by mouth daily with breakfast.           . METHOCARBAMOL 500 MG PO TABS   Oral   Take 500 mg by mouth 3 (three) times daily as needed. For muscle spasms         . RENA-VITE PO TABS   Oral   Take 1 tablet by mouth daily.           Marland Kitchen OMEPRAZOLE 20 MG PO CPDR   Oral   Take 20 mg by mouth daily.          Marland Kitchen RAMIPRIL 2.5 MG PO CAPS   Oral   Take 2.5 mg by mouth daily.          . TRAMADOL HCL 50 MG PO TABS   Oral   Take 50 mg by mouth 2 (two) times daily as needed. For pain         . VITAMIN B-12 1000 MCG PO TABS   Oral   Take 1,000 mcg by  mouth daily.           BP 123/83  Pulse 79  Temp 97.8 F (36.6 C)  Resp 16  SpO2 97%  Physical Exam  Nursing note and vitals reviewed. Constitutional: She appears well-developed and well-nourished. No distress.  HENT:  Head: Normocephalic and atraumatic.  Mouth/Throat: Oropharynx is clear and moist.  Neck: Normal range of motion. Neck supple.  Cardiovascular: Normal rate, regular rhythm and normal heart sounds.   Pulmonary/Chest: Effort normal and breath sounds normal.  Musculoskeletal: Normal range of motion.       Full ROM of the left ankle and all of the toes on the left foot.  Neurological: She is alert. No sensory deficit.  Skin: Skin is warm and dry. She is not diaphoretic. There is erythema.       Callused area on the bottom of the left foot at the area of the 1st MTP with surrounding erythema and warmth.  No fluctuance or active drainage at this time.  Full ROM of the left first MTP and all the other toes of the left foot .  No skin changes of the calcaneous or the distal tibia area.    Psychiatric: She has a normal mood and affect.    ED Course  Procedures (including critical care time)  Labs Reviewed - No data to display Dg Foot Complete Left  07/10/2012  *RADIOLOGY REPORT*  Clinical Data: Left foot pain.  LEFT FOOT - COMPLETE 3+ VIEW  Comparison: Left foot radiograph 05/07/2005.  Findings: Postop hip changes of the ORIF of the distal fibula are again noted.  The bones of the foot are mildly osteopenic.  No acute displaced fracture, subluxation or dislocation is noted. 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 Irregular sclerosis in the  distal tibia and in the calcaneus is of uncertain etiology and significance, but is favored to reflect sequelae of prior bone infarctions. Soft tissue swelling along the plantar aspect of the great toe and deep to the MTP joint, without  evidence of bony destruction in the adjacent first metatarsal or the first proximal phalanx.  IMPRESSION: 1. Soft tissue swelling in the plantar aspect of the great toe without evidence of underlying bony abnormality to suggest osteomyelitis at this time. 2.  Irregular areas of sclerosis in the distal tibia and calcaneus, suspicious for progressively worsening bone infarcts. 3.  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  Clinical correlation for signs and symptoms of neuropathic joint may be warranted.   Original Report Authenticated By: Trudie Reed, M.D.      No diagnosis found.    MDM  Patient presenting with what appears to be a cellulitis of her left foot.  Cellulitis surrounding a callus area. No fluctuance.   She has full ROM of all her toes on the left foot.  Patient afebrile.  Therefore, doubt septic joint.  No history of gout.  Xray negative for Osteomyelitis.  Xray does show concern for progressively worsening bone infracts, however, patient does not have any pain or symptoms in this area.  Patient given prescription for antibiotic and instructed to follow up with PCP.  She reports that she has seen a Podiatrist for management of this callused area.  Patient also states that she will follow up with her PCP as well.          Pascal Lux [place], PA-C 07/11/12 1237

## 2012-07-12 NOTE — ED Provider Notes (Signed)
Medical screening examination/treatment/procedure(s) were performed by non-physician practitioner and as supervising physician I was immediately available for consultation/collaboration.   Bowen Kia, MD 07/12/12 2027 

## 2012-07-25 ENCOUNTER — Other Ambulatory Visit (HOSPITAL_COMMUNITY): Payer: Self-pay | Admitting: Cardiovascular Disease

## 2012-07-25 DIAGNOSIS — E119 Type 2 diabetes mellitus without complications: Secondary | ICD-10-CM

## 2012-07-25 DIAGNOSIS — L97509 Non-pressure chronic ulcer of other part of unspecified foot with unspecified severity: Secondary | ICD-10-CM

## 2012-08-07 ENCOUNTER — Other Ambulatory Visit (HOSPITAL_BASED_OUTPATIENT_CLINIC_OR_DEPARTMENT_OTHER): Payer: Medicare Other | Admitting: Lab

## 2012-08-07 DIAGNOSIS — D696 Thrombocytopenia, unspecified: Secondary | ICD-10-CM

## 2012-08-07 DIAGNOSIS — D649 Anemia, unspecified: Secondary | ICD-10-CM

## 2012-08-07 LAB — CBC WITH DIFFERENTIAL/PLATELET
Basophils Absolute: 0 10*3/uL (ref 0.0–0.1)
Eosinophils Absolute: 0.1 10*3/uL (ref 0.0–0.5)
HCT: 39.4 % (ref 34.8–46.6)
HGB: 13.3 g/dL (ref 11.6–15.9)
MCH: 34.4 pg — ABNORMAL HIGH (ref 25.1–34.0)
MONO#: 0.4 10*3/uL (ref 0.1–0.9)
NEUT#: 3.4 10*3/uL (ref 1.5–6.5)
NEUT%: 54.4 % (ref 38.4–76.8)
RDW: 13.3 % (ref 11.2–14.5)
WBC: 6.3 10*3/uL (ref 3.9–10.3)
lymph#: 2.3 10*3/uL (ref 0.9–3.3)

## 2012-08-09 ENCOUNTER — Telehealth: Payer: Self-pay | Admitting: *Deleted

## 2012-08-09 NOTE — Telephone Encounter (Signed)
Message copied by Wende Mott on Wed Aug 09, 2012  9:40 AM ------      Message from: HA, Raliegh Ip T      Created: Mon Aug 07, 2012 11:47 PM       Please call pt.  Her thrombocytopenia is stable.  Continue watchful observation.  Thanks.

## 2012-08-09 NOTE — Telephone Encounter (Signed)
Called pt w/ her Platelet count,  Informed stable and to keep next lab appt in March 2014 as scheduled.  Pt verbalized understanding.

## 2012-08-10 ENCOUNTER — Encounter (HOSPITAL_BASED_OUTPATIENT_CLINIC_OR_DEPARTMENT_OTHER): Payer: Medicare Other

## 2012-08-14 ENCOUNTER — Encounter (HOSPITAL_COMMUNITY): Payer: Medicare Other

## 2012-08-31 ENCOUNTER — Ambulatory Visit (HOSPITAL_COMMUNITY)
Admission: RE | Admit: 2012-08-31 | Discharge: 2012-08-31 | Disposition: A | Discharge status: Home / Self Care | Payer: Medicare Other | Source: Ambulatory Visit | Attending: Cardiovascular Disease | Admitting: Cardiovascular Disease

## 2012-08-31 DIAGNOSIS — L97509 Non-pressure chronic ulcer of other part of unspecified foot with unspecified severity: Secondary | ICD-10-CM | POA: Insufficient documentation

## 2012-08-31 DIAGNOSIS — E119 Type 2 diabetes mellitus without complications: Secondary | ICD-10-CM

## 2012-08-31 DIAGNOSIS — E1169 Type 2 diabetes mellitus with other specified complication: Secondary | ICD-10-CM | POA: Insufficient documentation

## 2012-08-31 NOTE — Progress Notes (Signed)
Arterial Duplex Completed. Vidhi Delellis D  

## 2012-11-06 ENCOUNTER — Telehealth: Payer: Self-pay

## 2012-11-06 ENCOUNTER — Other Ambulatory Visit (HOSPITAL_BASED_OUTPATIENT_CLINIC_OR_DEPARTMENT_OTHER): Payer: Medicare Other

## 2012-11-06 DIAGNOSIS — D696 Thrombocytopenia, unspecified: Secondary | ICD-10-CM

## 2012-11-06 DIAGNOSIS — D649 Anemia, unspecified: Secondary | ICD-10-CM

## 2012-11-06 LAB — CBC WITH DIFFERENTIAL/PLATELET
Basophils Absolute: 0 10*3/uL (ref 0.0–0.1)
EOS%: 1.1 % (ref 0.0–7.0)
Eosinophils Absolute: 0.1 10*3/uL (ref 0.0–0.5)
HCT: 39 % (ref 34.8–46.6)
HGB: 12.6 g/dL (ref 11.6–15.9)
MCH: 33.3 pg (ref 25.1–34.0)
MCV: 103.2 fL — ABNORMAL HIGH (ref 79.5–101.0)
MONO%: 7 % (ref 0.0–14.0)
NEUT#: 3.4 10*3/uL (ref 1.5–6.5)
NEUT%: 52.2 % (ref 38.4–76.8)
Platelets: 138 10*3/uL — ABNORMAL LOW (ref 145–400)
RDW: 14.2 % (ref 11.2–14.5)

## 2013-01-28 ENCOUNTER — Emergency Department (HOSPITAL_COMMUNITY)
Admission: EM | Admit: 2013-01-28 | Discharge: 2013-01-29 | Disposition: A | Discharge status: Home / Self Care | Payer: Medicare Other | Attending: Emergency Medicine | Admitting: Emergency Medicine

## 2013-01-28 ENCOUNTER — Encounter (HOSPITAL_COMMUNITY): Payer: Self-pay | Admitting: *Deleted

## 2013-01-28 DIAGNOSIS — J449 Chronic obstructive pulmonary disease, unspecified: Secondary | ICD-10-CM | POA: Insufficient documentation

## 2013-01-28 DIAGNOSIS — N289 Disorder of kidney and ureter, unspecified: Secondary | ICD-10-CM | POA: Insufficient documentation

## 2013-01-28 DIAGNOSIS — F17200 Nicotine dependence, unspecified, uncomplicated: Secondary | ICD-10-CM | POA: Insufficient documentation

## 2013-01-28 DIAGNOSIS — S0990XA Unspecified injury of head, initial encounter: Secondary | ICD-10-CM | POA: Insufficient documentation

## 2013-01-28 DIAGNOSIS — I129 Hypertensive chronic kidney disease with stage 1 through stage 4 chronic kidney disease, or unspecified chronic kidney disease: Secondary | ICD-10-CM | POA: Insufficient documentation

## 2013-01-28 DIAGNOSIS — S8001XA Contusion of right knee, initial encounter: Secondary | ICD-10-CM

## 2013-01-28 DIAGNOSIS — D649 Anemia, unspecified: Secondary | ICD-10-CM | POA: Insufficient documentation

## 2013-01-28 DIAGNOSIS — Y929 Unspecified place or not applicable: Secondary | ICD-10-CM | POA: Insufficient documentation

## 2013-01-28 DIAGNOSIS — F319 Bipolar disorder, unspecified: Secondary | ICD-10-CM | POA: Insufficient documentation

## 2013-01-28 DIAGNOSIS — E785 Hyperlipidemia, unspecified: Secondary | ICD-10-CM | POA: Insufficient documentation

## 2013-01-28 DIAGNOSIS — W1809XA Striking against other object with subsequent fall, initial encounter: Secondary | ICD-10-CM | POA: Insufficient documentation

## 2013-01-28 DIAGNOSIS — Y9389 Activity, other specified: Secondary | ICD-10-CM | POA: Insufficient documentation

## 2013-01-28 DIAGNOSIS — E119 Type 2 diabetes mellitus without complications: Secondary | ICD-10-CM | POA: Insufficient documentation

## 2013-01-28 DIAGNOSIS — S8000XA Contusion of unspecified knee, initial encounter: Secondary | ICD-10-CM | POA: Insufficient documentation

## 2013-01-28 DIAGNOSIS — E039 Hypothyroidism, unspecified: Secondary | ICD-10-CM | POA: Insufficient documentation

## 2013-01-28 DIAGNOSIS — W19XXXA Unspecified fall, initial encounter: Secondary | ICD-10-CM

## 2013-01-28 DIAGNOSIS — D696 Thrombocytopenia, unspecified: Secondary | ICD-10-CM | POA: Insufficient documentation

## 2013-01-28 DIAGNOSIS — Z79899 Other long term (current) drug therapy: Secondary | ICD-10-CM | POA: Insufficient documentation

## 2013-01-28 DIAGNOSIS — IMO0002 Reserved for concepts with insufficient information to code with codable children: Secondary | ICD-10-CM | POA: Insufficient documentation

## 2013-01-28 DIAGNOSIS — J4489 Other specified chronic obstructive pulmonary disease: Secondary | ICD-10-CM | POA: Insufficient documentation

## 2013-01-28 NOTE — ED Notes (Signed)
Per EMS report: pt from home: pt c/o of fall.  Fall was unwitnessed and pt reports hitting her head on the table.  EMS palpated head and found no injury. Pt reports dizziness but pt has been able to ambulate to the restroom in the hospital with no difficulty.  Upon EMS arrival, pt's CBG was 65.  EMS gave pt 24grams of instaglucose and pt's CBG went up to a 96.  Pt a/o x 4 and skin warm and dry. EMS VS: BP: 158/90, HR: 88, RR: 18, 98% RA.

## 2013-01-28 NOTE — ED Notes (Signed)
Bed:WA06<BR> Expected date:<BR> Expected time:<BR> Means of arrival:<BR> Comments:<BR> ems

## 2013-01-29 ENCOUNTER — Emergency Department (HOSPITAL_COMMUNITY): Payer: Medicare Other

## 2013-01-29 MED ORDER — TRAMADOL HCL 50 MG PO TABS
50.0000 mg | ORAL_TABLET | Freq: Once | ORAL | Status: AC
  Administered 2013-01-29: 50 mg via ORAL
  Filled 2013-01-29: qty 1

## 2013-01-29 NOTE — ED Provider Notes (Signed)
History     CSN: [id number]  Arrival date & time 01/28/13  2342   First MD Initiated Contact with Patient 01/28/13 2349      No chief complaint on file.   (Consider location/radiation/quality/duration/timing/severity/associated sxs/prior treatment) HPI Comments: Patient states, that as she was getting up from the dinner table.  She tripped falling, hitting her knees on the floor, and her head on a chair.  This happened approximately 6 hours before she arrived she has not taken anything for pain.  She did not lose consciousness.  She is not dizzy or nauseated  The history is provided by the patient.    Past Medical History  Diagnosis Date  . Anemia 07/10/2011  . Thrombocytopenia 07/10/2011  . HTN (hypertension)   . COPD (chronic obstructive pulmonary disease)   . DM (diabetes mellitus)   . Renal insufficiency   . Hyperlipidemia   . Hypothyroidism   . Bipolar affective     Past Surgical History  Procedure Laterality Date  . Breast surgery    . Tonsillectomy    . Ankle surgery      Family History  Problem Relation Age of Onset  . Diabetes Mother   . Cancer Father   . HIV Brother     History  Substance Use Topics  . Smoking status: Current Every Day Smoker -- 0.50 packs/day for 40 years    Types: Cigarettes  . Smokeless tobacco: Not on file  . Alcohol Use: No    OB History   Grav Para Term Preterm Abortions TAB SAB Ect Mult Living                  Review of Systems  Constitutional: Negative for fever and chills.  HENT: Negative for congestion and rhinorrhea.   Eyes: Negative for visual disturbance.  Respiratory: Negative for shortness of breath.   Cardiovascular: Negative for chest pain.  Musculoskeletal: Positive for arthralgias. Negative for joint swelling.  Neurological: Negative for dizziness and headaches.  All other systems reviewed and are negative.    Allergies  Doxycycline; Glipizide; Ibuprofen; Oxybutynin chloride; Penicillins; Sulfonamide  derivatives; Tetracycline; and Tolterodine tartrate  Home Medications   Current Outpatient Rx  Name  Route  Sig  Dispense  Refill  . atorvastatin (LIPITOR) 40 MG tablet   Oral   Take 40 mg by mouth at bedtime.          . carvedilol (COREG) 3.125 MG tablet   Oral   Take 3.125 mg by mouth daily.         . Cholecalciferol (VITAMIN D3) 1000 UNITS CAPS   Oral   Take 1,000 Units by mouth daily.          . clonazePAM (KLONOPIN) 0.5 MG tablet   Oral   Take 0.5 mg by mouth at bedtime.         . divalproex (DEPAKOTE) 250 MG DR tablet   Oral   Take 750 mg by mouth 2 (two) times daily.         Marland Kitchen esomeprazole (NEXIUM) 40 MG capsule   Oral   Take 40 mg by mouth daily before breakfast.         . fluticasone (FLONASE) 50 MCG/ACT nasal spray   Nasal   Place 2 sprays into the nose daily.         . Fluticasone-Salmeterol (ADVAIR) 250-50 MCG/DOSE AEPB   Inhalation   Inhale 1 puff into the lungs every 12 (twelve) hours.         Marland Kitchen  glipiZIDE (GLUCOTROL XL) 5 MG 24 hr tablet   Oral   Take 5 mg by mouth daily.         . haloperidol (HALDOL) 5 MG tablet   Oral   Take 2.5-5 mg by mouth 2 (two) times daily. 1 tab in am and 1/2 tab at hs         . levothyroxine (SYNTHROID, LEVOTHROID) 75 MCG tablet   Oral   Take 75 mcg by mouth daily.           . methocarbamol (ROBAXIN) 500 MG tablet   Oral   Take 500 mg by mouth 3 (three) times daily as needed. For muscle spasms         . multivitamin (RENA-VIT) TABS tablet   Oral   Take 1 tablet by mouth daily.           . ramipril (ALTACE) 2.5 MG capsule   Oral   Take 2.5 mg by mouth daily.          Marland Kitchen tiotropium (SPIRIVA) 18 MCG inhalation capsule   Inhalation   Place 18 mcg into inhaler and inhale daily.         . traMADol (ULTRAM) 50 MG tablet   Oral   Take 50 mg by mouth 2 (two) times daily as needed. For pain           BP 118/75  Pulse 79  Temp(Src) 98.2 F (36.8 C) (Oral)  Resp 18  Ht 5' 5.5"  (1.664 m)  Wt 220 lb (99.791 kg)  BMI 36.04 kg/m2  SpO2 98%  Physical Exam  ED Course  Procedures (including critical care time)  Labs Reviewed - No data to display Ct Head Wo Contrast  01/29/2013   *RADIOLOGY REPORT*  Clinical Data: Fall, hit head  CT HEAD WITHOUT CONTRAST  Technique:  Contiguous axial images were obtained from the base of the skull through the vertex without contrast.  Comparison: 02/12/2010  Findings: Mild cortical volume loss noted with proportional ventricular prominence. No acute hemorrhage, acute infarction, or mass lesion is identified.  No midline shift.  No ventriculomegaly. No skull fracture. Examination is degraded by patient motion.  Mild right maxillary mucoperiosteal thickening is noted.  IMPRESSION: No acute intracranial finding.  Mild right maxillary sinusitis.   Original Report Authenticated By: Christiana Pellant, M.D.   Dg Knee Complete 4 Views Left  01/29/2013   *RADIOLOGY REPORT*  Clinical Data: Fall, prepatellar soft tissue bruising  LEFT KNEE - COMPLETE 4+ VIEW  Comparison: None.  Findings: Mild tricompartmental degenerative change identified.  No suprapatellar effusion.  No fracture or dislocation.  IMPRESSION: No acute osseous abnormality of the left knee.   Original Report Authenticated By: Christiana Pellant, M.D.   Dg Knee Complete 4 Views Right  01/29/2013   *RADIOLOGY REPORT*  Clinical Data: Status post fall; bruising about the right patella.  RIGHT KNEE - COMPLETE 4+ VIEW  Comparison: Right knee radiographs performed 01/29/2007  Findings: There is no evidence of fracture or dislocation.  The joint spaces are preserved.  Small marginal osteophytes are noted arising at all three compartments.  An enthesophyte is noted arising at the superior pole of the patella.  No significant joint effusion is seen.  Minimal apparent soft tissue swelling inferior to the patella appears significantly improved from the prior study and may reflect the patient's baseline.   IMPRESSION: No evidence of fracture or dislocation.  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   Original Report Authenticated By:  Tonia Ghent, M.D.     1. Fall, initial encounter   2. Contusion, knee, right, initial encounter       MDM  X-rays are negative will discharge patient home         Arman Filter, NP 01/29/13 0141

## 2013-02-05 ENCOUNTER — Other Ambulatory Visit: Payer: Medicare Other | Admitting: Lab

## 2013-02-05 NOTE — ED Provider Notes (Signed)
Medical screening examination/treatment/procedure(s) were performed by non-physician practitioner and as supervising physician I was immediately available for consultation/collaboration.  Jaci Desanto R. Shakyla Nolley, MD 02/05/13 0723 

## 2013-03-15 ENCOUNTER — Encounter (INDEPENDENT_AMBULATORY_CARE_PROVIDER_SITE_OTHER): Payer: Self-pay | Admitting: Surgery

## 2013-03-23 ENCOUNTER — Ambulatory Visit (INDEPENDENT_AMBULATORY_CARE_PROVIDER_SITE_OTHER): Payer: Self-pay | Admitting: Surgery

## 2013-03-27 ENCOUNTER — Ambulatory Visit (INDEPENDENT_AMBULATORY_CARE_PROVIDER_SITE_OTHER): Payer: Self-pay | Admitting: Surgery

## 2013-04-12 ENCOUNTER — Encounter (INDEPENDENT_AMBULATORY_CARE_PROVIDER_SITE_OTHER): Payer: Self-pay

## 2013-04-12 ENCOUNTER — Other Ambulatory Visit (INDEPENDENT_AMBULATORY_CARE_PROVIDER_SITE_OTHER): Payer: Self-pay | Admitting: Surgery

## 2013-04-12 ENCOUNTER — Ambulatory Visit (INDEPENDENT_AMBULATORY_CARE_PROVIDER_SITE_OTHER): Payer: Medicare Other | Admitting: Surgery

## 2013-04-12 ENCOUNTER — Encounter (INDEPENDENT_AMBULATORY_CARE_PROVIDER_SITE_OTHER): Payer: Self-pay | Admitting: Surgery

## 2013-04-12 VITALS — BP 130/76 | HR 88 | Resp 18 | Ht 66.0 in | Wt 234.8 lb

## 2013-04-12 DIAGNOSIS — K436 Other and unspecified ventral hernia with obstruction, without gangrene: Secondary | ICD-10-CM | POA: Insufficient documentation

## 2013-04-12 DIAGNOSIS — K439 Ventral hernia without obstruction or gangrene: Secondary | ICD-10-CM

## 2013-04-12 NOTE — Progress Notes (Signed)
Patient ID: [name], female   DOB: April 26, 1961, 52 y.o.   MRN: [id number]  Chief Complaint  Patient presents with  . New Evaluation     eval abd hernia    HPI [name] is a 52 y.o. female.  Referred by Dr. Julio Sicks for evaluation of umbilical hernia  HPI This is a 52 year old female with a complex past medical and psychiatric history who has had an umbilical hernia for many years. Due to her comorbidities, she was felt to be a high-risk candidate for hernia repair. However the hernia has become much larger and is more uncomfortable. She denies any obstructive symptoms. She smokes less than half a pack a day. She continues to gain a little bit of weight each year. She presents now for surgical evaluation.  Past Medical History  Diagnosis Date  . Anemia 07/10/2011  . Thrombocytopenia 07/10/2011  . HTN (hypertension)   . COPD (chronic obstructive pulmonary disease)   . DM (diabetes mellitus)   . Renal insufficiency   . Hyperlipidemia   . Hypothyroidism   . Bipolar affective     Past Surgical History  Procedure Laterality Date  . Breast surgery    . Tonsillectomy    . Ankle surgery      Family History  Problem Relation Age of Onset  . Diabetes Mother   . Cancer Father   . HIV Brother     Social History History  Substance Use Topics  . Smoking status: Current Every Day Smoker -- 0.50 packs/day for 40 years    Types: Cigarettes  . Smokeless tobacco: Not on file  . Alcohol Use: No    Allergies  Allergen Reactions  . Doxycycline     REACTION: Unknown reaction  . Glipizide   . Ibuprofen   . Oxybutynin Chloride     REACTION: Skin rash, itching  . Penicillins Other (See Comments)    unknown  . Sulfonamide Derivatives     REACTION: Unknown reaction  . Tetracycline     REACTION: Unknown reaction  . Tolterodine Tartrate     REACTION: itching    Current Outpatient Prescriptions  Medication Sig Dispense Refill  . atorvastatin (LIPITOR) 40 MG  tablet Take 40 mg by mouth at bedtime.       . carvedilol (COREG) 3.125 MG tablet Take 3.125 mg by mouth daily.      . Cholecalciferol (VITAMIN D3) 1000 UNITS CAPS Take 1,000 Units by mouth daily.       . clonazePAM (KLONOPIN) 0.5 MG tablet Take 0.5 mg by mouth at bedtime.      . divalproex (DEPAKOTE) 250 MG DR tablet Take 750 mg by mouth 2 (two) times daily.      Marland Kitchen esomeprazole (NEXIUM) 40 MG capsule Take 40 mg by mouth daily before breakfast.      . etodolac (LODINE) 500 MG tablet       . fluticasone (FLONASE) 50 MCG/ACT nasal spray Place 2 sprays into the nose daily.      . Fluticasone-Salmeterol (ADVAIR) 250-50 MCG/DOSE AEPB Inhale 1 puff into the lungs every 12 (twelve) hours.      Marland Kitchen glipiZIDE (GLUCOTROL XL) 5 MG 24 hr tablet Take 5 mg by mouth daily.      . haloperidol (HALDOL) 5 MG tablet Take 2.5-5 mg by mouth 2 (two) times daily. 1 tab in am and 1/2 tab at hs      . HYDROcodone-acetaminophen (NORCO/VICODIN) 5-325 MG per tablet       .  levothyroxine (SYNTHROID, LEVOTHROID) 75 MCG tablet Take 75 mcg by mouth daily.        . metFORMIN (GLUCOPHAGE) 500 MG tablet       . methocarbamol (ROBAXIN) 500 MG tablet Take 500 mg by mouth 3 (three) times daily as needed. For muscle spasms      . multivitamin (RENA-VIT) TABS tablet Take 1 tablet by mouth daily.        Marland Kitchen nystatin cream (MYCOSTATIN)       . PROAIR HFA 108 (90 BASE) MCG/ACT inhaler       . ramipril (ALTACE) 2.5 MG capsule Take 2.5 mg by mouth daily.       Marland Kitchen tiotropium (SPIRIVA) 18 MCG inhalation capsule Place 18 mcg into inhaler and inhale daily.      Marland Kitchen tiZANidine (ZANAFLEX) 2 MG tablet       . traMADol (ULTRAM) 50 MG tablet Take 50 mg by mouth 2 (two) times daily as needed. For pain       No current facility-administered medications for this visit.    Review of Systems Review of Systems  Constitutional: Negative for fever, chills and unexpected weight change.  HENT: Negative for hearing loss, congestion, sore throat, trouble  swallowing and voice change.   Eyes: Negative for visual disturbance.  Respiratory: Negative for cough and wheezing.   Cardiovascular: Negative for chest pain, palpitations and leg swelling.  Gastrointestinal: Positive for nausea, abdominal pain and abdominal distention. Negative for vomiting, diarrhea, constipation, blood in stool and anal bleeding.  Genitourinary: Negative for hematuria, vaginal bleeding and difficulty urinating.  Musculoskeletal: Positive for back pain. Negative for arthralgias.  Skin: Negative for rash and wound.  Neurological: Negative for seizures, syncope and headaches.  Hematological: Negative for adenopathy. Does not bruise/bleed easily.  Psychiatric/Behavioral: Negative for confusion.    Blood pressure 130/76, pulse 88, resp. rate 18, height 5\' 6"  (1.676 m), weight 234 lb 12.8 oz (106.505 kg).  Physical Exam Physical Exam Obese female in NAD HEENT:  EOMI, sclera anicteric Neck:  No masses, no thyromegaly Lungs:  CTA bilaterally; normal respiratory effort CV:  Regular rate and rhythm; no murmurs Abd:  +bowel sounds, obese; soft; protruding periumbilical ventral hernia - sac measures about 20 cm across; unable to fully reduce; no skin breakdown Ext:  Well-perfused; no edema Skin:  Warm, dry; no sign of jaundice  Data Reviewed none  Assessment    Large periumbilical ventral hernia     Plan    CT scan to define the anatomy of the hernia, including the contents and the presence of other hernia defects.  Will probably need open repair of hernia with mesh using an open technique.  She is at high-risk for complications due to her diabetes, her smoking, her morbid obesity, and her overall medical and psychiatric condition.  We will discuss this further at the next visit.        Naliah Eddington K. 04/12/2013, 3:32 PM

## 2013-04-13 ENCOUNTER — Other Ambulatory Visit (INDEPENDENT_AMBULATORY_CARE_PROVIDER_SITE_OTHER): Payer: Self-pay | Admitting: Surgery

## 2013-04-14 LAB — CREATININE, SERUM: Creat: 1.96 mg/dL — ABNORMAL HIGH (ref 0.50–1.10)

## 2013-04-14 LAB — BUN: BUN: 37 mg/dL — ABNORMAL HIGH (ref 6–23)

## 2013-04-16 ENCOUNTER — Ambulatory Visit
Admission: RE | Admit: 2013-04-16 | Discharge: 2013-04-16 | Disposition: A | Discharge status: Home / Self Care | Payer: Medicare Other | Source: Ambulatory Visit | Attending: Surgery | Admitting: Surgery

## 2013-04-16 DIAGNOSIS — K439 Ventral hernia without obstruction or gangrene: Secondary | ICD-10-CM

## 2013-05-01 ENCOUNTER — Ambulatory Visit (INDEPENDENT_AMBULATORY_CARE_PROVIDER_SITE_OTHER): Payer: Medicare Other | Admitting: Surgery

## 2013-05-01 ENCOUNTER — Encounter (INDEPENDENT_AMBULATORY_CARE_PROVIDER_SITE_OTHER): Payer: Self-pay | Admitting: Surgery

## 2013-05-01 VITALS — BP 124/78 | HR 76 | Temp 98.1°F | Resp 20 | Ht 66.0 in | Wt 234.4 lb

## 2013-05-01 DIAGNOSIS — K436 Other and unspecified ventral hernia with obstruction, without gangrene: Secondary | ICD-10-CM

## 2013-05-01 NOTE — Patient Instructions (Signed)
Please Call our surgery schedulers at [phone] to schedule her surgery. Please stop your aspirin 5 days before surgery.

## 2013-05-01 NOTE — Progress Notes (Signed)
HPI  [name] is a 52 y.o. female. Referred by Dr. Julio Sicks for evaluation of umbilical hernia  HPI  This is a 52 year old female with a complex past medical and psychiatric history who has had an umbilical hernia for many years. Due to her comorbidities, she was felt to be a high-risk candidate for hernia repair. However the hernia has become much larger and is more uncomfortable. She denies any obstructive symptoms. She smokes less than half a pack a day. She continues to gain a little bit of weight each year. She presents now for surgical evaluation.  Past Medical History   Diagnosis  Date   .  Anemia  07/10/2011   .  Thrombocytopenia  07/10/2011   .  HTN (hypertension)    .  COPD (chronic obstructive pulmonary disease)    .  DM (diabetes mellitus)    .  Renal insufficiency    .  Hyperlipidemia    .  Hypothyroidism    .  Bipolar affective     Past Surgical History   Procedure  Laterality  Date   .  Breast surgery     .  Tonsillectomy     .  Ankle surgery      Family History   Problem  Relation  Age of Onset   .  Diabetes  Mother    .  Cancer  Father    .  HIV  Brother    Social History  History   Substance Use Topics   .  Smoking status:  Current Every Day Smoker -- 0.50 packs/day for 40 years     Types:  Cigarettes   .  Smokeless tobacco:  Not on file   .  Alcohol Use:  No    Allergies   Allergen  Reactions   .  Doxycycline      REACTION: Unknown reaction   .  Glipizide    .  Ibuprofen    .  Oxybutynin Chloride      REACTION: Skin rash, itching   .  Penicillins  Other (See Comments)     unknown   .  Sulfonamide Derivatives      REACTION: Unknown reaction   .  Tetracycline      REACTION: Unknown reaction   .  Tolterodine Tartrate      REACTION: itching    Current Outpatient Prescriptions   Medication  Sig  Dispense  Refill   .  atorvastatin (LIPITOR) 40 MG tablet  Take 40 mg by mouth at bedtime.     .  carvedilol (COREG) 3.125 MG tablet  Take 3.125  mg by mouth daily.     .  Cholecalciferol (VITAMIN D3) 1000 UNITS CAPS  Take 1,000 Units by mouth daily.     .  clonazePAM (KLONOPIN) 0.5 MG tablet  Take 0.5 mg by mouth at bedtime.     .  divalproex (DEPAKOTE) 250 MG DR tablet  Take 750 mg by mouth 2 (two) times daily.     Marland Kitchen  esomeprazole (NEXIUM) 40 MG capsule  Take 40 mg by mouth daily before breakfast.     .  etodolac (LODINE) 500 MG tablet      .  fluticasone (FLONASE) 50 MCG/ACT nasal spray  Place 2 sprays into the nose daily.     .  Fluticasone-Salmeterol (ADVAIR) 250-50 MCG/DOSE AEPB  Inhale 1 puff into the lungs every 12 (twelve) hours.     Marland Kitchen  glipiZIDE (GLUCOTROL XL) 5  MG 24 hr tablet  Take 5 mg by mouth daily.     .  haloperidol (HALDOL) 5 MG tablet  Take 2.5-5 mg by mouth 2 (two) times daily. 1 tab in am and 1/2 tab at hs     .  HYDROcodone-acetaminophen (NORCO/VICODIN) 5-325 MG per tablet      .  levothyroxine (SYNTHROID, LEVOTHROID) 75 MCG tablet  Take 75 mcg by mouth daily.     .  metFORMIN (GLUCOPHAGE) 500 MG tablet      .  methocarbamol (ROBAXIN) 500 MG tablet  Take 500 mg by mouth 3 (three) times daily as needed. For muscle spasms     .  multivitamin (RENA-VIT) TABS tablet  Take 1 tablet by mouth daily.     Marland Kitchen  nystatin cream (MYCOSTATIN)      .  PROAIR HFA 108 (90 BASE) MCG/ACT inhaler      .  ramipril (ALTACE) 2.5 MG capsule  Take 2.5 mg by mouth daily.     Marland Kitchen  tiotropium (SPIRIVA) 18 MCG inhalation capsule  Place 18 mcg into inhaler and inhale daily.     Marland Kitchen  tiZANidine (ZANAFLEX) 2 MG tablet      .  traMADol (ULTRAM) 50 MG tablet  Take 50 mg by mouth 2 (two) times daily as needed. For pain      No current facility-administered medications for this visit.   Review of Systems  Review of Systems  Constitutional: Negative for fever, chills and unexpected weight change.  HENT: Negative for hearing loss, congestion, sore throat, trouble swallowing and voice change.  Eyes: Negative for visual disturbance.  Respiratory: Negative  for cough and wheezing.  Cardiovascular: Negative for chest pain, palpitations and leg swelling.  Gastrointestinal: Positive for nausea, abdominal pain and abdominal distention. Negative for vomiting, diarrhea, constipation, blood in stool and anal bleeding.  Genitourinary: Negative for hematuria, vaginal bleeding and difficulty urinating.  Musculoskeletal: Positive for back pain. Negative for arthralgias.  Skin: Negative for rash and wound.  Neurological: Negative for seizures, syncope and headaches.  Hematological: Negative for adenopathy. Does not bruise/bleed easily.  Psychiatric/Behavioral: Negative for confusion.   Filed Vitals:   05/01/13 1518  BP: 124/78  Pulse: 76  Temp: 98.1 F (36.7 C)  Resp: 20    Physical Exam  Physical Exam  Obese female in NAD  HEENT: EOMI, sclera anicteric  Neck: No masses, no thyromegaly  Lungs: CTA bilaterally; normal respiratory effort  CV: Regular rate and rhythm; no murmurs  Abd: +bowel sounds, obese; soft; protruding periumbilical ventral hernia - sac measures about 20 cm across; unable to fully reduce; no skin breakdown  Ext: Well-perfused; no edema  Skin: Warm, dry; no sign of jaundice  Data Reviewed  CT scan 8/18 RADIOLOGY REPORT*  Clinical Data: Evaluate ventral hernia. Occasional upper abdominal  pain and diarrhea. History of right breast cancer.  CT ABDOMEN AND PELVIS WITHOUT CONTRAST  Technique: Multidetector CT imaging of the abdomen and pelvis was  performed following the standard protocol without intravenous  contrast.  Comparison: None.  Findings: Lung bases show minimal scattered tiny subpleural  nodularity, likely representative of subpleural lymph nodes. Heart  size normal. No pericardial or pleural effusion.  Liver is unremarkable. Small hyperattenuating stones layer in the  gallbladder. Right adrenal gland is unremarkable. An 11 mm low  attenuation nodule in the left adrenal gland is noted. Low  attenuation lesions in  the kidneys measure up to 5.9 cm on the  left. The largest lesion  has either peripheral wall calcification  or dependent milk of calcium. Spleen, pancreas, stomach and bowel  are unremarkable.  There is a large ventral hernia containing unobstructed bowel.  Hernia neck measures approximately 4.8 cm. It is seen in the low  ventral midline, in the anatomic pelvis.  No pathologically enlarged lymph nodes. Uterus and ovaries are  visualized. No worrisome lytic or sclerotic lesions.  IMPRESSION:  1. Midline ventral pelvic wall hernia containing unobstructed  colon.  2. Cholelithiasis.  3. Left adrenal adenoma.  Original Report Authenticated By: Leanna Battles, M.D.  Assessment  Large periumbilical ventral hernia - fascial defect 4.8 cm Asymptomatic cholelithiasis and left adrenal adenoma Plan:    Open ventral hernia repair with mesh.  The surgical procedure has been discussed with the patient.  Potential risks, benefits, alternative treatments, and expected outcomes have been explained.  All of the patient's questions at this time have been answered.  The likelihood of reaching the patient's treatment goal is good.  The patient understand the proposed surgical procedure and wishes to proceed.

## 2013-05-02 ENCOUNTER — Telehealth (INDEPENDENT_AMBULATORY_CARE_PROVIDER_SITE_OTHER): Payer: Self-pay | Admitting: Surgery

## 2013-05-02 NOTE — Telephone Encounter (Signed)
Patient will call back to schedule, face sheet placed in pending folder.

## 2013-05-07 ENCOUNTER — Other Ambulatory Visit (HOSPITAL_BASED_OUTPATIENT_CLINIC_OR_DEPARTMENT_OTHER): Payer: Medicare Other | Admitting: Lab

## 2013-05-07 ENCOUNTER — Ambulatory Visit (HOSPITAL_BASED_OUTPATIENT_CLINIC_OR_DEPARTMENT_OTHER): Payer: Medicare Other | Admitting: Oncology

## 2013-05-07 ENCOUNTER — Encounter: Payer: Self-pay | Admitting: Oncology

## 2013-05-07 ENCOUNTER — Telehealth: Payer: Self-pay | Admitting: Oncology

## 2013-05-07 VITALS — BP 129/93 | HR 71 | Temp 98.8°F | Resp 19 | Ht 66.0 in | Wt 237.6 lb

## 2013-05-07 DIAGNOSIS — D696 Thrombocytopenia, unspecified: Secondary | ICD-10-CM

## 2013-05-07 DIAGNOSIS — E119 Type 2 diabetes mellitus without complications: Secondary | ICD-10-CM

## 2013-05-07 DIAGNOSIS — J449 Chronic obstructive pulmonary disease, unspecified: Secondary | ICD-10-CM

## 2013-05-07 DIAGNOSIS — D649 Anemia, unspecified: Secondary | ICD-10-CM

## 2013-05-07 DIAGNOSIS — E039 Hypothyroidism, unspecified: Secondary | ICD-10-CM

## 2013-05-07 DIAGNOSIS — I10 Essential (primary) hypertension: Secondary | ICD-10-CM

## 2013-05-07 LAB — COMPREHENSIVE METABOLIC PANEL (CC13)
ALT: 18 U/L (ref 0–55)
AST: 23 U/L (ref 5–34)
Albumin: 2.7 g/dL — ABNORMAL LOW (ref 3.5–5.0)
CO2: 26 mEq/L (ref 22–29)
Calcium: 9 mg/dL (ref 8.4–10.4)
Chloride: 108 mEq/L (ref 98–109)
Creatinine: 1.8 mg/dL — ABNORMAL HIGH (ref 0.6–1.1)
Potassium: 4 mEq/L (ref 3.5–5.1)

## 2013-05-07 LAB — CBC WITH DIFFERENTIAL/PLATELET
BASO%: 0.2 % (ref 0.0–2.0)
Basophils Absolute: 0 10*3/uL (ref 0.0–0.1)
EOS%: 1.9 % (ref 0.0–7.0)
HCT: 34.7 % — ABNORMAL LOW (ref 34.8–46.6)
HGB: 11.7 g/dL (ref 11.6–15.9)
MONO#: 0.5 10*3/uL (ref 0.1–0.9)
NEUT%: 57.6 % (ref 38.4–76.8)
RDW: 13.2 % (ref 11.2–14.5)
WBC: 5.2 10*3/uL (ref 3.9–10.3)
lymph#: 1.6 10*3/uL (ref 0.9–3.3)

## 2013-05-07 LAB — CHCC SMEAR

## 2013-05-07 NOTE — Telephone Encounter (Signed)
gv and printed appt sched and avs for pt for Dec, Feb, May and Aug

## 2013-05-07 NOTE — Progress Notes (Signed)
[facility]  [phone] [phone]   OFFICE PROGRESS NOTE   Cc:  OSEI-BONSU,GEORGE, MD  DIAGNOSIS: mild, chronic thrombocytopenia due to medications.   CURRENT THERAPY:  Watchful observation.   INTERVAL HISTORY: [name] 52 y.o. female returns for regular follow up by herself.  She is somewhat of a poor historian. She did not bring her medications or a list of her medication with her today. She is unsure what she takes. [name] reported doing well.  She denied anorexia, fever, weight loss, visible source of bleeding, recurrent infections. She thinks that her mood is stable. She is due to have hernia repair surgery next month.  Past Medical History  Diagnosis Date  . Anemia 07/10/2011  . Thrombocytopenia 07/10/2011  . HTN (hypertension)   . COPD (chronic obstructive pulmonary disease)   . DM (diabetes mellitus)   . Renal insufficiency   . Hyperlipidemia   . Hypothyroidism   . Bipolar affective     Past Surgical History  Procedure Laterality Date  . Breast surgery    . Tonsillectomy    . Ankle surgery      Current Outpatient Prescriptions  Medication Sig Dispense Refill  . atorvastatin (LIPITOR) 40 MG tablet Take 40 mg by mouth at bedtime.       . carvedilol (COREG) 3.125 MG tablet Take 3.125 mg by mouth daily.      . Cholecalciferol (VITAMIN D3) 1000 UNITS CAPS Take 1,000 Units by mouth daily.       . divalproex (DEPAKOTE) 250 MG DR tablet Take 750 mg by mouth 2 (two) times daily.      Marland Kitchen esomeprazole (NEXIUM) 40 MG capsule Take 40 mg by mouth daily before breakfast.      . Fluticasone-Salmeterol (ADVAIR) 250-50 MCG/DOSE AEPB Inhale 1 puff into the lungs every 12 (twelve) hours.      Marland Kitchen glipiZIDE (GLUCOTROL XL) 5 MG 24 hr tablet Take 5 mg by mouth daily.      . haloperidol (HALDOL) 5 MG tablet Take 2.5-5 mg by mouth 2 (two) times daily. 1 tab in am and 1/2 tab at hs      . levothyroxine (SYNTHROID, LEVOTHROID) 75 MCG  tablet Take 75 mcg by mouth daily.        Marland Kitchen PROAIR HFA 108 (90 BASE) MCG/ACT inhaler       . ramipril (ALTACE) 2.5 MG capsule Take 2.5 mg by mouth daily.       Marland Kitchen tiotropium (SPIRIVA) 18 MCG inhalation capsule Place 18 mcg into inhaler and inhale daily.       No current facility-administered medications for this visit.    ALLERGIES:  is allergic to doxycycline; glipizide; ibuprofen; oxybutynin chloride; penicillins; sulfonamide derivatives; tetracycline; and tolterodine tartrate.  REVIEW OF SYSTEMS:  The rest of the 14-point review of system was negative.   Filed Vitals:   05/07/13 1437  BP: 129/93  Pulse: 71  Temp: 98.8 F (37.1 C)  Resp: 19   Wt Readings from Last 3 Encounters:  05/07/13 237 lb 9.6 oz (107.775 kg)  05/01/13 234 lb 6.4 oz (106.323 kg)  04/12/13 234 lb 12.8 oz (106.505 kg)   ECOG Performance status: 1  PHYSICAL EXAMINATION:   General:  Mildly obese woman, in no acute distress.  Eyes:  no scleral icterus.  ENT:  There were no oropharyngeal lesions.  She was edentulous.  Neck was without thyromegaly.  Lymphatics:  Negative cervical, supraclavicular or axillary adenopathy.  Respiratory: lungs were  clear bilaterally without wheezing or crackles.  Cardiovascular:  Regular rate and rhythm, S1/S2, without murmur, rub or gallop.  There was no pedal edema.  GI:  abdomen was soft, flat, nontender, nondistended, without organomegaly.  Muscoloskeletal:  no spinal tenderness of palpation of vertebral spine.  Skin exam was without echymosis, petichae.  Neuro exam was nonfocal.  Patient was able to get on and off exam table without assistance.  Gait was normal.  She had no problem initiating steps, stopping, or turning.  Patient was alerted.  Attention was good.   Language was appropriate.  Mood was normal without depression.  Speech was not pressured.  Thought content was not tangential.    LABORATORY/RADIOLOGY DATA:  Lab Results  Component Value Date   WBC 5.2 05/07/2013   HGB  11.7 05/07/2013   HCT 34.7* 05/07/2013   PLT 99* 05/07/2013   GLUCOSE 81 05/07/2013   ALKPHOS 85 05/07/2013   ALT 18 05/07/2013   AST 23 05/07/2013   NA 141 05/07/2013   K 4.0 05/07/2013   CL 106 05/04/2012   CREATININE 1.8* 05/07/2013   BUN 29.1* 05/07/2013   CO2 26 05/07/2013   HGBA1C  Value: 5.8 (NOTE)                                                                       According to the ADA Clinical Practice Recommendations for 2011, when HbA1c is used as a screening test:   >=6.5%   Diagnostic of Diabetes Mellitus           (if abnormal result  is confirmed)  5.7-6.4%   Increased risk of developing Diabetes Mellitus  References:Diagnosis and Classification of Diabetes Mellitus,Diabetes Care,2011,34(Suppl 1):S62-S69 and Standards of Medical Care in         Diabetes - 2011,Diabetes Care,2011,34  (Suppl 1):S11-S61.* 02/13/2010     ASSESSMENT AND PLAN:   1. Schizoaffective disorder: On Depakote per PCP.  2. Hypothyroidism: On Synthroid per PCP.  3. Hyperlipidemia: On Lipitor per PCP.  4. COPD: 2/2 smoking; Not ready emotional to stop despite education and attempts in the past.   5. DM: She is followed by her PCP. She does not know what medication she takes for this and what her blood sugars have been running.  6. HTN: Manage by PCP. She does not know what medications she takes for this.  7. Chronic thrombocytopenia:  A.  Diagnosis:  Thrombocytopenia, NOS.  B.  Potential causes:  Depakote vs. Hypothyroid vs. Lipitor use. At this time, I still have low concern for a primary bone marrow failure process due to stability and chronicity of this problem.   C.  Recommendation:  Watchful observation.  A bone marrow biopsy at this time has low clinical utility.  In the future, if your platelet count continues to decrease to <80 or also with significant anemia and low white blood cell, then, a bone marrow biopsy will be appropriate.  D.  Follow up:  Lab in 3, 6, and 9 months.  Follow up in about 1 year.    The length of  time of the face-to-face encounter was 15  minutes. More than 50% of time was spent counseling and coordination of care.

## 2013-05-29 ENCOUNTER — Encounter (INDEPENDENT_AMBULATORY_CARE_PROVIDER_SITE_OTHER): Payer: Self-pay

## 2013-06-01 ENCOUNTER — Other Ambulatory Visit (HOSPITAL_COMMUNITY): Payer: Self-pay | Admitting: *Deleted

## 2013-06-04 ENCOUNTER — Encounter (HOSPITAL_COMMUNITY): Payer: Self-pay

## 2013-06-04 ENCOUNTER — Encounter (HOSPITAL_COMMUNITY): Payer: Self-pay | Admitting: Pharmacy Technician

## 2013-06-04 ENCOUNTER — Encounter (HOSPITAL_COMMUNITY)
Admission: RE | Admit: 2013-06-04 | Discharge: 2013-06-04 | Disposition: A | Discharge status: Home / Self Care | Payer: Medicare Other | Source: Ambulatory Visit | Attending: Surgery | Admitting: Surgery

## 2013-06-04 ENCOUNTER — Other Ambulatory Visit: Payer: Self-pay

## 2013-06-04 ENCOUNTER — Ambulatory Visit (HOSPITAL_COMMUNITY)
Admission: RE | Admit: 2013-06-04 | Discharge: 2013-06-04 | Disposition: A | Discharge status: Home / Self Care | Payer: Medicare Other | Source: Ambulatory Visit | Attending: Surgery | Admitting: Surgery

## 2013-06-04 DIAGNOSIS — Z01812 Encounter for preprocedural laboratory examination: Secondary | ICD-10-CM | POA: Insufficient documentation

## 2013-06-04 DIAGNOSIS — Z01810 Encounter for preprocedural cardiovascular examination: Secondary | ICD-10-CM | POA: Insufficient documentation

## 2013-06-04 DIAGNOSIS — K439 Ventral hernia without obstruction or gangrene: Secondary | ICD-10-CM | POA: Insufficient documentation

## 2013-06-04 DIAGNOSIS — Z01818 Encounter for other preprocedural examination: Secondary | ICD-10-CM | POA: Insufficient documentation

## 2013-06-04 HISTORY — DX: Malignant (primary) neoplasm, unspecified: C80.1

## 2013-06-04 LAB — CBC
HCT: 33.6 % — ABNORMAL LOW (ref 36.0–46.0)
Hemoglobin: 11.1 g/dL — ABNORMAL LOW (ref 12.0–15.0)
MCH: 33.6 pg (ref 26.0–34.0)
MCV: 101.8 fL — ABNORMAL HIGH (ref 78.0–100.0)
Platelets: 132 10*3/uL — ABNORMAL LOW (ref 150–400)
RDW: 12.8 % (ref 11.5–15.5)
WBC: 4.8 10*3/uL (ref 4.0–10.5)

## 2013-06-04 LAB — BASIC METABOLIC PANEL
BUN: 37 mg/dL — ABNORMAL HIGH (ref 6–23)
CO2: 24 mEq/L (ref 19–32)
Calcium: 9.2 mg/dL (ref 8.4–10.5)
Chloride: 103 mEq/L (ref 96–112)
Creatinine, Ser: 1.93 mg/dL — ABNORMAL HIGH (ref 0.50–1.10)
GFR calc Af Amer: 33 mL/min — ABNORMAL LOW (ref >90)
GFR calc non Af Amer: 28 mL/min — ABNORMAL LOW (ref >90)
Glucose, Bld: 104 mg/dL — ABNORMAL HIGH (ref 70–99)
Potassium: 3.9 mEq/L (ref 3.5–5.1)

## 2013-06-04 NOTE — Progress Notes (Signed)
bmet results roted to dr Corliss Skains inbox by epic

## 2013-06-04 NOTE — Patient Instructions (Addendum)
18 [name]  06/04/2013   Your procedure is scheduled on: 06-06-2013  Report to [facility] at 745 AM.  Call this number if you have problems the morning of surgery [phone]   Remember: PLEASE SEND COPY OF LATEST MEDICATION RECORD WITH PATIENT DAY OF SURGERY.   Do not eat food or drink liquids :After Midnight.     Take these medicines the morning of surgery with A SIP OF WATER: CARVEDILOL(COREG), NEXIUM, ADVAIR INHALER, PROAIR INHALER, SPIRIVA INHALER, SYNTHROID                                 SEE [facility] PREPARING FOR SURGERY SHEET             You may not have any metal on your body including hair pins and piercings  Do not wear jewelry, make-up.  Do not wear lotions, powders, or perfumes. You may wear deodorant.   Men may shave face and neck.  Do not bring valuables to the hospital.  IS NOT RESPONSIBLE FOR VALUEABLES.  Contacts, dentures or bridgework may not be worn into surgery.  Leave suitcase in the car. After surgery it may be brought to your room.  For patients admitted to the hospital, checkout time is 11:00 AM the day of discharge.   Patients discharged the day of surgery will not be allowed to drive home.  Name and phone number of your driver:  Special Instructions: N/A   Please read over the following fact sheets that you were given:   Call Cain Sieve RN pre op nurse if needed [phone]    FAILURE TO FOLLOW THESE INSTRUCTIONS MAY RESULT IN THE CANCELLATION OF YOUR SURGERY.  PATIENT SIGNATURE___________________________________________  NURSE SIGNATURE_____________________________________________

## 2013-06-05 ENCOUNTER — Encounter (INDEPENDENT_AMBULATORY_CARE_PROVIDER_SITE_OTHER): Payer: Medicare Other | Admitting: Surgery

## 2013-06-05 MED ORDER — BACITRACIN-NEOMYCIN-POLYMYXIN 400-5-5000 EX OINT
TOPICAL_OINTMENT | CUTANEOUS | Status: AC
  Filled 2013-06-05: qty 1

## 2013-06-06 ENCOUNTER — Inpatient Hospital Stay (HOSPITAL_COMMUNITY): Payer: Medicare Other | Admitting: Anesthesiology

## 2013-06-06 ENCOUNTER — Encounter (HOSPITAL_COMMUNITY): Payer: Medicare Other | Admitting: Anesthesiology

## 2013-06-06 ENCOUNTER — Encounter (HOSPITAL_COMMUNITY): Payer: Self-pay | Admitting: *Deleted

## 2013-06-06 ENCOUNTER — Inpatient Hospital Stay (HOSPITAL_COMMUNITY)
Admission: RE | Admit: 2013-06-06 | Discharge: 2013-06-07 | DRG: 355 | Disposition: A | Discharge status: Home Health | Payer: Medicare Other | Source: Ambulatory Visit | Attending: Surgery | Admitting: Surgery

## 2013-06-06 ENCOUNTER — Encounter (HOSPITAL_COMMUNITY)
Admission: RE | Disposition: A | Discharge status: Home Health | Payer: Self-pay | Source: Ambulatory Visit | Attending: Surgery

## 2013-06-06 DIAGNOSIS — Z833 Family history of diabetes mellitus: Secondary | ICD-10-CM

## 2013-06-06 DIAGNOSIS — F17200 Nicotine dependence, unspecified, uncomplicated: Secondary | ICD-10-CM | POA: Diagnosis present

## 2013-06-06 DIAGNOSIS — I10 Essential (primary) hypertension: Secondary | ICD-10-CM | POA: Diagnosis present

## 2013-06-06 DIAGNOSIS — J4489 Other specified chronic obstructive pulmonary disease: Secondary | ICD-10-CM | POA: Diagnosis present

## 2013-06-06 DIAGNOSIS — E669 Obesity, unspecified: Secondary | ICD-10-CM | POA: Diagnosis present

## 2013-06-06 DIAGNOSIS — F319 Bipolar disorder, unspecified: Secondary | ICD-10-CM | POA: Diagnosis present

## 2013-06-06 DIAGNOSIS — Z880 Allergy status to penicillin: Secondary | ICD-10-CM

## 2013-06-06 DIAGNOSIS — E039 Hypothyroidism, unspecified: Secondary | ICD-10-CM | POA: Diagnosis present

## 2013-06-06 DIAGNOSIS — E119 Type 2 diabetes mellitus without complications: Secondary | ICD-10-CM | POA: Diagnosis present

## 2013-06-06 DIAGNOSIS — Z79899 Other long term (current) drug therapy: Secondary | ICD-10-CM

## 2013-06-06 DIAGNOSIS — E785 Hyperlipidemia, unspecified: Secondary | ICD-10-CM | POA: Diagnosis present

## 2013-06-06 DIAGNOSIS — K429 Umbilical hernia without obstruction or gangrene: Principal | ICD-10-CM | POA: Diagnosis present

## 2013-06-06 DIAGNOSIS — J449 Chronic obstructive pulmonary disease, unspecified: Secondary | ICD-10-CM | POA: Diagnosis present

## 2013-06-06 DIAGNOSIS — K436 Other and unspecified ventral hernia with obstruction, without gangrene: Secondary | ICD-10-CM

## 2013-06-06 DIAGNOSIS — N289 Disorder of kidney and ureter, unspecified: Secondary | ICD-10-CM | POA: Diagnosis present

## 2013-06-06 HISTORY — PX: INSERTION OF MESH: SHX5868

## 2013-06-06 HISTORY — PX: VENTRAL HERNIA REPAIR: SHX424

## 2013-06-06 LAB — CREATININE, SERUM
Creatinine, Ser: 1.88 mg/dL — ABNORMAL HIGH (ref 0.50–1.10)
GFR calc Af Amer: 34 mL/min — ABNORMAL LOW (ref >90)
GFR calc non Af Amer: 29 mL/min — ABNORMAL LOW (ref >90)

## 2013-06-06 LAB — CBC
MCH: 33.9 pg (ref 26.0–34.0)
MCHC: 32.9 g/dL (ref 30.0–36.0)
Platelets: 133 10*3/uL — ABNORMAL LOW (ref 150–400)
RBC: 3.51 MIL/uL — ABNORMAL LOW (ref 3.87–5.11)
RDW: 13 % (ref 11.5–15.5)
WBC: 5.6 10*3/uL (ref 4.0–10.5)

## 2013-06-06 LAB — GLUCOSE, CAPILLARY
Glucose-Capillary: 104 mg/dL — ABNORMAL HIGH (ref 70–99)
Glucose-Capillary: 82 mg/dL (ref 70–99)
Glucose-Capillary: 114 mg/dL — ABNORMAL HIGH (ref 70–99)

## 2013-06-06 LAB — SURGICAL PCR SCREEN: Staphylococcus aureus: NEGATIVE

## 2013-06-06 LAB — HEMOGLOBIN A1C: Hgb A1c MFr Bld: 5.5 % (ref <5.7)

## 2013-06-06 SURGERY — REPAIR, HERNIA, VENTRAL
Anesthesia: General | Site: Abdomen | Wound class: Clean

## 2013-06-06 MED ORDER — HYDROMORPHONE HCL PF 1 MG/ML IJ SOLN
0.2500 mg | INTRAMUSCULAR | Status: DC | PRN
  Administered 2013-06-06 (×2): 0.5 mg via INTRAVENOUS

## 2013-06-06 MED ORDER — POTASSIUM CHLORIDE IN NACL 20-0.9 MEQ/L-% IV SOLN
INTRAVENOUS | Status: DC
  Filled 2013-06-06 (×5): qty 1000

## 2013-06-06 MED ORDER — HYDROMORPHONE HCL PF 1 MG/ML IJ SOLN
INTRAMUSCULAR | Status: AC
  Filled 2013-06-06: qty 1

## 2013-06-06 MED ORDER — PHENYLEPHRINE HCL 10 MG/ML IJ SOLN
10.0000 mg | INTRAVENOUS | Status: DC | PRN
  Administered 2013-06-06: 50 ug/min via INTRAVENOUS

## 2013-06-06 MED ORDER — LACTATED RINGERS IV SOLN
INTRAVENOUS | Status: DC | PRN
  Administered 2013-06-06: 09:00:00 via INTRAVENOUS
  Administered 2013-06-06: 13:00:00

## 2013-06-06 MED ORDER — DIVALPROEX SODIUM 500 MG PO DR TAB
750.0000 mg | DELAYED_RELEASE_TABLET | Freq: Two times a day (BID) | ORAL | Status: DC
  Administered 2013-06-06 – 2013-06-07 (×2): 750 mg via ORAL
  Filled 2013-06-06 (×3): qty 1

## 2013-06-06 MED ORDER — MIDAZOLAM HCL 2 MG/2ML IJ SOLN
0.5000 mg | INTRAMUSCULAR | Status: DC
  Administered 2013-06-06 (×2): 0.5 mg via INTRAVENOUS

## 2013-06-06 MED ORDER — LIDOCAINE HCL (CARDIAC) 20 MG/ML IV SOLN
INTRAVENOUS | Status: DC | PRN
  Administered 2013-06-06: 100 mg via INTRAVENOUS

## 2013-06-06 MED ORDER — MUPIROCIN 2 % EX OINT
TOPICAL_OINTMENT | Freq: Two times a day (BID) | CUTANEOUS | Status: DC
  Administered 2013-06-06: 09:00:00 via NASAL
  Filled 2013-06-06: qty 22

## 2013-06-06 MED ORDER — ONDANSETRON HCL 4 MG/2ML IJ SOLN: 4.0000 mg | Freq: Four times a day (QID) | INTRAMUSCULAR | Status: DC | PRN

## 2013-06-06 MED ORDER — MOMETASONE FURO-FORMOTEROL FUM 100-5 MCG/ACT IN AERO
2.0000 | INHALATION_SPRAY | Freq: Two times a day (BID) | RESPIRATORY_TRACT | Status: DC
  Administered 2013-06-07: 2 via RESPIRATORY_TRACT
  Filled 2013-06-06: qty 8.8

## 2013-06-06 MED ORDER — ALPRAZOLAM 0.5 MG PO TABS
0.5000 mg | ORAL_TABLET | Freq: Three times a day (TID) | ORAL | Status: DC | PRN
  Administered 2013-06-07: 0.5 mg via ORAL
  Filled 2013-06-06 (×2): qty 1

## 2013-06-06 MED ORDER — GLYCOPYRROLATE 0.2 MG/ML IJ SOLN
INTRAMUSCULAR | Status: DC | PRN
  Administered 2013-06-06: .6 mg via INTRAVENOUS

## 2013-06-06 MED ORDER — MIDAZOLAM HCL 2 MG/2ML IJ SOLN
INTRAMUSCULAR | Status: AC
  Filled 2013-06-06: qty 2

## 2013-06-06 MED ORDER — PANTOPRAZOLE SODIUM 40 MG PO TBEC
80.0000 mg | DELAYED_RELEASE_TABLET | Freq: Every day | ORAL | Status: DC
  Filled 2013-06-06: qty 2

## 2013-06-06 MED ORDER — INSULIN ASPART 100 UNIT/ML ~~LOC~~ SOLN
6.0000 [IU] | Freq: Three times a day (TID) | SUBCUTANEOUS | Status: DC
  Administered 2013-06-07: 6 [IU] via SUBCUTANEOUS

## 2013-06-06 MED ORDER — SODIUM CHLORIDE 0.9 % IR SOLN
Status: DC | PRN
  Administered 2013-06-06: 200 mL

## 2013-06-06 MED ORDER — OXYCODONE-ACETAMINOPHEN 5-325 MG PO TABS
1.0000 | ORAL_TABLET | ORAL | Status: DC | PRN
  Administered 2013-06-06 – 2013-06-07 (×3): 2 via ORAL
  Filled 2013-06-06 (×3): qty 2

## 2013-06-06 MED ORDER — TIOTROPIUM BROMIDE MONOHYDRATE 18 MCG IN CAPS
18.0000 ug | ORAL_CAPSULE | Freq: Every day | RESPIRATORY_TRACT | Status: DC
  Administered 2013-06-07: 18 ug via RESPIRATORY_TRACT
  Filled 2013-06-06: qty 5

## 2013-06-06 MED ORDER — ATORVASTATIN CALCIUM 40 MG PO TABS
40.0000 mg | ORAL_TABLET | Freq: Every day | ORAL | Status: DC
  Administered 2013-06-06: 40 mg via ORAL
  Filled 2013-06-06 (×2): qty 1

## 2013-06-06 MED ORDER — MIDAZOLAM HCL 5 MG/5ML IJ SOLN
INTRAMUSCULAR | Status: DC | PRN
  Administered 2013-06-06: 2 mg via INTRAVENOUS

## 2013-06-06 MED ORDER — HALOPERIDOL 0.5 MG PO TABS: 2.5000 mg | ORAL_TABLET | Freq: Two times a day (BID) | ORAL | Status: DC

## 2013-06-06 MED ORDER — ALBUTEROL SULFATE HFA 108 (90 BASE) MCG/ACT IN AERS
2.0000 | INHALATION_SPRAY | Freq: Four times a day (QID) | RESPIRATORY_TRACT | Status: DC
  Administered 2013-06-07 (×2): 2 via RESPIRATORY_TRACT
  Filled 2013-06-06: qty 6.7

## 2013-06-06 MED ORDER — GLIPIZIDE ER 5 MG PO TB24
5.0000 mg | ORAL_TABLET | Freq: Every morning | ORAL | Status: DC
Start: 2013-06-06 — End: 2013-06-07
  Administered 2013-06-06 – 2013-06-07 (×2): 5 mg via ORAL
  Filled 2013-06-06 (×2): qty 1

## 2013-06-06 MED ORDER — VANCOMYCIN HCL 10 G IV SOLR
1500.0000 mg | INTRAVENOUS | Status: AC
  Administered 2013-06-06: 1500 mg via INTRAVENOUS
  Filled 2013-06-06: qty 1500

## 2013-06-06 MED ORDER — HALOPERIDOL 5 MG PO TABS
5.0000 mg | ORAL_TABLET | Freq: Every day | ORAL | Status: DC
  Filled 2013-06-06: qty 1

## 2013-06-06 MED ORDER — NEOSTIGMINE METHYLSULFATE 1 MG/ML IJ SOLN
INTRAMUSCULAR | Status: DC | PRN
  Administered 2013-06-06: 5 mg via INTRAVENOUS

## 2013-06-06 MED ORDER — INSULIN ASPART 100 UNIT/ML ~~LOC~~ SOLN
0.0000 [IU] | Freq: Three times a day (TID) | SUBCUTANEOUS | Status: DC
  Administered 2013-06-07: 3 [IU] via SUBCUTANEOUS

## 2013-06-06 MED ORDER — BUPIVACAINE HCL (PF) 0.25 % IJ SOLN
INTRAMUSCULAR | Status: AC
  Filled 2013-06-06: qty 30

## 2013-06-06 MED ORDER — FENTANYL CITRATE 0.05 MG/ML IJ SOLN
INTRAMUSCULAR | Status: DC | PRN
  Administered 2013-06-06 (×4): 50 ug via INTRAVENOUS

## 2013-06-06 MED ORDER — LEVOTHYROXINE SODIUM 75 MCG PO TABS
75.0000 ug | ORAL_TABLET | Freq: Every day | ORAL | Status: DC
  Administered 2013-06-07: 75 ug via ORAL
  Filled 2013-06-06 (×2): qty 1

## 2013-06-06 MED ORDER — HYDROMORPHONE HCL PF 1 MG/ML IJ SOLN: 1.0000 mg | INTRAMUSCULAR | Status: DC | PRN

## 2013-06-06 MED ORDER — ONDANSETRON HCL 4 MG PO TABS
4.0000 mg | ORAL_TABLET | Freq: Four times a day (QID) | ORAL | Status: DC | PRN
  Administered 2013-06-07: 4 mg via ORAL
  Filled 2013-06-06: qty 1

## 2013-06-06 MED ORDER — PHENYLEPHRINE HCL 10 MG/ML IJ SOLN
INTRAMUSCULAR | Status: DC | PRN
  Administered 2013-06-06: 80 ug via INTRAVENOUS
  Administered 2013-06-06: 40 ug via INTRAVENOUS
  Administered 2013-06-06 (×2): 80 ug via INTRAVENOUS
  Administered 2013-06-06: 40 ug via INTRAVENOUS

## 2013-06-06 MED ORDER — ONDANSETRON HCL 4 MG/2ML IJ SOLN
INTRAMUSCULAR | Status: DC | PRN
  Administered 2013-06-06: 4 mg via INTRAMUSCULAR

## 2013-06-06 MED ORDER — PROMETHAZINE HCL 25 MG/ML IJ SOLN: 6.2500 mg | INTRAMUSCULAR | Status: DC | PRN

## 2013-06-06 MED ORDER — ENOXAPARIN SODIUM 40 MG/0.4ML ~~LOC~~ SOLN
40.0000 mg | SUBCUTANEOUS | Status: DC
  Filled 2013-06-06 (×2): qty 0.4

## 2013-06-06 MED ORDER — RAMIPRIL 2.5 MG PO CAPS
2.5000 mg | ORAL_CAPSULE | Freq: Every morning | ORAL | Status: DC
  Administered 2013-06-06 – 2013-06-07 (×2): 2.5 mg via ORAL
  Filled 2013-06-06 (×2): qty 1

## 2013-06-06 MED ORDER — PROPOFOL 10 MG/ML IV BOLUS
INTRAVENOUS | Status: DC | PRN
  Administered 2013-06-06: 150 mg via INTRAVENOUS

## 2013-06-06 MED ORDER — HALOPERIDOL 2 MG PO TABS
2.5000 mg | ORAL_TABLET | Freq: Every day | ORAL | Status: DC
  Filled 2013-06-06 (×2): qty 1

## 2013-06-06 MED ORDER — HALOPERIDOL 2 MG PO TABS
2.5000 mg | ORAL_TABLET | Freq: Every day | ORAL | Status: DC
  Filled 2013-06-06: qty 1

## 2013-06-06 MED ORDER — ALPRAZOLAM 0.25 MG PO TABS
0.2500 mg | ORAL_TABLET | Freq: Once | ORAL | Status: AC
  Administered 2013-06-06: 0.25 mg via ORAL
  Filled 2013-06-06: qty 1

## 2013-06-06 MED ORDER — CARVEDILOL 3.125 MG PO TABS
3.1250 mg | ORAL_TABLET | Freq: Two times a day (BID) | ORAL | Status: DC
  Administered 2013-06-06: 3.125 mg via ORAL
  Filled 2013-06-06: qty 1

## 2013-06-06 MED ORDER — HALOPERIDOL 5 MG PO TABS
5.0000 mg | ORAL_TABLET | Freq: Every day | ORAL | Status: DC
  Administered 2013-06-06 – 2013-06-07 (×2): 5 mg via ORAL
  Filled 2013-06-06 (×2): qty 1

## 2013-06-06 MED ORDER — CARVEDILOL 3.125 MG PO TABS
3.1250 mg | ORAL_TABLET | Freq: Every morning | ORAL | Status: DC
  Administered 2013-06-06 – 2013-06-07 (×2): 3.125 mg via ORAL
  Filled 2013-06-06 (×2): qty 1

## 2013-06-06 MED ORDER — ROCURONIUM BROMIDE 100 MG/10ML IV SOLN
INTRAVENOUS | Status: DC | PRN
  Administered 2013-06-06: 10 mg via INTRAVENOUS
  Administered 2013-06-06: 35 mg via INTRAVENOUS

## 2013-06-06 MED ORDER — SUCCINYLCHOLINE CHLORIDE 20 MG/ML IJ SOLN
INTRAMUSCULAR | Status: DC | PRN
  Administered 2013-06-06: 100 mg via INTRAVENOUS

## 2013-06-06 SURGICAL SUPPLY — 46 items
BINDER ABD UNIV 12 45-62 (WOUND CARE) IMPLANT
BINDER ABDOMINAL 46IN 62IN (WOUND CARE) ×2
BLADE HEX COATED 2.75 (ELECTRODE) ×2 IMPLANT
CANISTER SUCTION 2500CC (MISCELLANEOUS) ×2 IMPLANT
CLOTH BEACON ORANGE TIMEOUT ST (SAFETY) ×1 IMPLANT
DECANTER SPIKE VIAL GLASS SM (MISCELLANEOUS) IMPLANT
DRAIN CHANNEL 10F 3/8 F FF (DRAIN) IMPLANT
DRAIN CHANNEL RND F F (WOUND CARE) ×1 IMPLANT
DRAPE LAPAROSCOPIC ABDOMINAL (DRAPES) ×2 IMPLANT
DRAPE UTILITY XL STRL (DRAPES) ×2 IMPLANT
ELECT REM PT RETURN 9FT ADLT (ELECTROSURGICAL) ×2
ELECTRODE REM PT RTRN 9FT ADLT (ELECTROSURGICAL) ×1 IMPLANT
EVACUATOR SILICONE 100CC (DRAIN) ×1 IMPLANT
GLOVE BIO SURGEON STRL SZ7 (GLOVE) ×6 IMPLANT
GLOVE BIOGEL PI IND STRL 7.0 (GLOVE) ×1 IMPLANT
GLOVE BIOGEL PI IND STRL 7.5 (GLOVE) ×1 IMPLANT
GLOVE BIOGEL PI INDICATOR 7.0 (GLOVE) ×1
GLOVE BIOGEL PI INDICATOR 7.5 (GLOVE) ×1
GOWN PREVENTION PLUS LG XLONG (DISPOSABLE) ×4 IMPLANT
GOWN STRL REIN XL XLG (GOWN DISPOSABLE) ×2 IMPLANT
KIT BASIN OR (CUSTOM PROCEDURE TRAY) ×2 IMPLANT
LIGASURE IMPACT 36 18CM CVD LR (INSTRUMENTS) ×1 IMPLANT
MESH VENTRALIGHT ST 4.5IN (Mesh General) ×1 IMPLANT
NEEDLE HYPO 22GX1.5 SAFETY (NEEDLE) IMPLANT
PACK GENERAL/GYN (CUSTOM PROCEDURE TRAY) ×2 IMPLANT
SPONGE GAUZE 4X4 12PLY (GAUZE/BANDAGES/DRESSINGS) ×2 IMPLANT
STAPLER VISISTAT 35W (STAPLE) ×2 IMPLANT
SUT ETHILON 2 0 PS N (SUTURE) ×1 IMPLANT
SUT MNCRL AB 4-0 PS2 18 (SUTURE) IMPLANT
SUT NOVA 1 T20/GS 25DT (SUTURE) ×4 IMPLANT
SUT NOVA NAB GS-21 0 18 T12 DT (SUTURE) ×1 IMPLANT
SUT PDS AB 1 CTX 36 (SUTURE) IMPLANT
SUT PROLENE 0 CT 1 CR/8 (SUTURE) IMPLANT
SUT PROLENE 2 0 KS (SUTURE) IMPLANT
SUT SILK 3 0 (SUTURE)
SUT SILK 3-0 18XBRD TIE 12 (SUTURE) ×1 IMPLANT
SUT VIC AB 2-0 CT1 27 (SUTURE)
SUT VIC AB 2-0 CT1 27XBRD (SUTURE) ×1 IMPLANT
SUT VIC AB 2-0 SH 27 (SUTURE)
SUT VIC AB 2-0 SH 27X BRD (SUTURE) IMPLANT
SUT VIC AB 3-0 SH 27 (SUTURE)
SUT VIC AB 3-0 SH 27X BRD (SUTURE) IMPLANT
SUT VIC AB 3-0 SH 27XBRD (SUTURE) ×1 IMPLANT
SYR CONTROL 10ML LL (SYRINGE) IMPLANT
TOWEL OR 17X26 10 PK STRL BLUE (TOWEL DISPOSABLE) ×2 IMPLANT
TRAY FOLEY CATH 14FRSI W/METER (CATHETERS) ×1 IMPLANT

## 2013-06-06 NOTE — Progress Notes (Signed)
Patient is continuing to scream and yell.  Unable to console patient. Oral medications have not been effective. Will notify md.

## 2013-06-06 NOTE — Progress Notes (Addendum)
Pt is hollering out and very uncooperative. Pt refused to take medications by mouth. Pt through cup of water at respiratory therapist. Dr Daphine Deutscher notified and talked to pt.  [name]

## 2013-06-06 NOTE — Transfer of Care (Signed)
Immediate Anesthesia Transfer of Care Note  Patient: [name]  Procedure(s) Performed: Procedure(s) (LRB):  OPEN VENTRAL HERNIA REPAIR (N/A) INSERTION OF MESH (N/A)  Patient Location: PACU  Anesthesia Type: General  Level of Consciousness: sedated, patient cooperative and responds to stimulation  Airway & Oxygen Therapy: Patient Spontanous Breathing and Patient connected to face mask oxgen  Post-op Assessment: Report given to PACU RN and Post -op Vital signs reviewed and stable  Post vital signs: Reviewed and stable  Complications: No apparent anesthesia complications

## 2013-06-06 NOTE — Progress Notes (Signed)
Patient states she is very stressed out, she is yelling out all of her needs, unable to be consoled. Pt is not able to make reasonable decisions due to cognitive status. Notifying md.

## 2013-06-06 NOTE — H&P (Signed)
[name] is a 52 y.o. female. Referred by Dr. Julio Sicks for evaluation of umbilical hernia  HPI  This is a 52 year old female with a complex past medical and psychiatric history who has had an umbilical hernia for many years. Due to her comorbidities, she was felt to be a high-risk candidate for hernia repair. However the hernia has become much larger and is more uncomfortable. She denies any obstructive symptoms. She smokes less than half a pack a day. She continues to gain a little bit of weight each year. She presents now for surgical evaluation.  Past Medical History   Diagnosis  Date   .  Anemia  07/10/2011   .  Thrombocytopenia  07/10/2011   .  HTN (hypertension)    .  COPD (chronic obstructive pulmonary disease)    .  DM (diabetes mellitus)    .  Renal insufficiency    .  Hyperlipidemia    .  Hypothyroidism    .  Bipolar affective     Past Surgical History   Procedure  Laterality  Date   .  Breast surgery     .  Tonsillectomy     .  Ankle surgery      Family History   Problem  Relation  Age of Onset   .  Diabetes  Mother    .  Cancer  Father    .  HIV  Brother    Social History  History   Substance Use Topics   .  Smoking status:  Current Every Day Smoker -- 0.50 packs/day for 40 years     Types:  Cigarettes   .  Smokeless tobacco:  Not on file   .  Alcohol Use:  No    Allergies   Allergen  Reactions   .  Doxycycline      REACTION: Unknown reaction   .  Glipizide    .  Ibuprofen    .  Oxybutynin Chloride      REACTION: Skin rash, itching   .  Penicillins  Other (See Comments)     unknown   .  Sulfonamide Derivatives      REACTION: Unknown reaction   .  Tetracycline      REACTION: Unknown reaction   .  Tolterodine Tartrate      REACTION: itching    Current Outpatient Prescriptions   Medication  Sig  Dispense  Refill   .  atorvastatin (LIPITOR) 40 MG tablet  Take 40 mg by mouth at bedtime.     .  carvedilol (COREG) 3.125 MG tablet  Take 3.125 mg  by mouth daily.     .  Cholecalciferol (VITAMIN D3) 1000 UNITS CAPS  Take 1,000 Units by mouth daily.     .  clonazePAM (KLONOPIN) 0.5 MG tablet  Take 0.5 mg by mouth at bedtime.     .  divalproex (DEPAKOTE) 250 MG DR tablet  Take 750 mg by mouth 2 (two) times daily.     Marland Kitchen  esomeprazole (NEXIUM) 40 MG capsule  Take 40 mg by mouth daily before breakfast.     .  etodolac (LODINE) 500 MG tablet      .  fluticasone (FLONASE) 50 MCG/ACT nasal spray  Place 2 sprays into the nose daily.     .  Fluticasone-Salmeterol (ADVAIR) 250-50 MCG/DOSE AEPB  Inhale 1 puff into the lungs every 12 (twelve) hours.     Marland Kitchen  glipiZIDE (GLUCOTROL XL) 5 MG 24  hr tablet  Take 5 mg by mouth daily.     .  haloperidol (HALDOL) 5 MG tablet  Take 2.5-5 mg by mouth 2 (two) times daily. 1 tab in am and 1/2 tab at hs     .  HYDROcodone-acetaminophen (NORCO/VICODIN) 5-325 MG per tablet      .  levothyroxine (SYNTHROID, LEVOTHROID) 75 MCG tablet  Take 75 mcg by mouth daily.     .  metFORMIN (GLUCOPHAGE) 500 MG tablet      .  methocarbamol (ROBAXIN) 500 MG tablet  Take 500 mg by mouth 3 (three) times daily as needed. For muscle spasms     .  multivitamin (RENA-VIT) TABS tablet  Take 1 tablet by mouth daily.     Marland Kitchen  nystatin cream (MYCOSTATIN)      .  PROAIR HFA 108 (90 BASE) MCG/ACT inhaler      .  ramipril (ALTACE) 2.5 MG capsule  Take 2.5 mg by mouth daily.     Marland Kitchen  tiotropium (SPIRIVA) 18 MCG inhalation capsule  Place 18 mcg into inhaler and inhale daily.     Marland Kitchen  tiZANidine (ZANAFLEX) 2 MG tablet      .  traMADol (ULTRAM) 50 MG tablet  Take 50 mg by mouth 2 (two) times daily as needed. For pain      No current facility-administered medications for this visit.   Review of Systems  Review of Systems  Constitutional: Negative for fever, chills and unexpected weight change.  HENT: Negative for hearing loss, congestion, sore throat, trouble swallowing and voice change.  Eyes: Negative for visual disturbance.  Respiratory: Negative  for cough and wheezing.  Cardiovascular: Negative for chest pain, palpitations and leg swelling.  Gastrointestinal: Positive for nausea, abdominal pain and abdominal distention. Negative for vomiting, diarrhea, constipation, blood in stool and anal bleeding.  Genitourinary: Negative for hematuria, vaginal bleeding and difficulty urinating.  Musculoskeletal: Positive for back pain. Negative for arthralgias.  Skin: Negative for rash and wound.  Neurological: Negative for seizures, syncope and headaches.  Hematological: Negative for adenopathy. Does not bruise/bleed easily.  Psychiatric/Behavioral: Negative for confusion.  Filed Vitals:    05/01/13 1518   BP:  124/78   Pulse:  76   Temp:  98.1 F (36.7 C)   Resp:  20   Physical Exam  Physical Exam  Obese female in NAD  HEENT: EOMI, sclera anicteric  Neck: No masses, no thyromegaly  Lungs: CTA bilaterally; normal respiratory effort  CV: Regular rate and rhythm; no murmurs  Abd: +bowel sounds, obese; soft; protruding periumbilical ventral hernia - sac measures about 20 cm across; unable to fully reduce; no skin breakdown  Ext: Well-perfused; no edema  Skin: Warm, dry; no sign of jaundice  Data Reviewed  CT scan 8/18  RADIOLOGY REPORT*  Clinical Data: Evaluate ventral hernia. Occasional upper abdominal  pain and diarrhea. History of right breast cancer.  CT ABDOMEN AND PELVIS WITHOUT CONTRAST  Technique: Multidetector CT imaging of the abdomen and pelvis was  performed following the standard protocol without intravenous  contrast.  Comparison: None.  Findings: Lung bases show minimal scattered tiny subpleural  nodularity, likely representative of subpleural lymph nodes. Heart  size normal. No pericardial or pleural effusion.  Liver is unremarkable. Small hyperattenuating stones layer in the  gallbladder. Right adrenal gland is unremarkable. An 11 mm low  attenuation nodule in the left adrenal gland is noted. Low  attenuation  lesions in the kidneys measure up to 5.9 cm on  the  left. The largest lesion has either peripheral wall calcification  or dependent milk of calcium. Spleen, pancreas, stomach and bowel  are unremarkable.  There is a large ventral hernia containing unobstructed bowel.  Hernia neck measures approximately 4.8 cm. It is seen in the low  ventral midline, in the anatomic pelvis.  No pathologically enlarged lymph nodes. Uterus and ovaries are  visualized. No worrisome lytic or sclerotic lesions.  IMPRESSION:  1. Midline ventral pelvic wall hernia containing unobstructed  colon.  2. Cholelithiasis.  3. Left adrenal adenoma.  Original Report Authenticated By: Leanna Battles, M.D.  Assessment  Large periumbilical ventral hernia - fascial defect 4.8 cm  Asymptomatic cholelithiasis and left adrenal adenoma  Plan:  Open ventral hernia repair with mesh. The surgical procedure has been discussed with the patient. Potential risks, benefits, alternative treatments, and expected outcomes have been explained. All of the patient's questions at this time have been answered. The likelihood of reaching the patient's treatment goal is good. The patient understand the proposed surgical procedure and wishes to proceed.   [name], MD, [facility] Surgery  General/ Trauma Surgery  06/06/2013 8:37 AM

## 2013-06-06 NOTE — Op Note (Signed)
Ventral Hernia Repair Procedure Note  Indications: Symptomatic ventral hernia  Pre-operative Diagnosis: Periumbilical ventral hernia  Post-operative Diagnosis: Periumbilical ventral hernia  Surgeon: Vinod Mikesell K.   Assistants: Dr. Romie Levee  Anesthesia: General endotracheal anesthesia  ASA Class: 3  Procedure Details  The patient was seen in the Holding Room. The risks, benefits, complications, treatment options, and expected outcomes were discussed with the patient. The possibilities of reaction to medication, pulmonary aspiration, perforation of viscus, bleeding, recurrent infection, the need for additional procedures, failure to diagnose a condition, and creating a complication requiring transfusion or operation were discussed with the patient. The patient concurred with the proposed plan, giving informed consent.  The site of surgery properly noted/marked. The patient was taken to the operating room, identified as [name] and the procedure verified as ventral hernia repair. A Time Out was held and the above information confirmed.  The patient was placed supine.  After establishing general anesthesia, the abdomen was prepped with Chloraprep and draped in sterile fashion.  She has a fairly massive periumbilical hernia.  We made a vertical incision over the palpable hernia. Dissection was carried down to the hernia sac located above the fascia and mobilized from surrounding structures.  Intact fascia was identified circumferentially around the defect.  The defect measured about 6 cm across, but the hernia sac was about 25 cm across.  The hernia sac was opened and we encountered an enormous amount of omentum, transverse colon, and small bowel.  The omentum contained a firm area of fat necrosis.  This was resected with the Ligasure device.  The bowel was reduced back into the peritoneal cavity.  The hernia sac was excised. We used a 11.4 cm round Bard ventral hernia mesh and  secured this to the fascia with interrupted 1 Novofil sutures.  The fascial defect was reapproximated with interrupted figure-of-8 1 Novofil sutures.  The subcutaneous tissues were irrigated. A drain was placed in the subcutaneous space and secured with 2-0 nylon.   Hemostasis was confirmed.  The skin incision was closed with staples.  Instrument, sponge, and needle counts were correct prior to closure and at the conclusion of the case.   Findings: Large umbilical fascial defect containing colon, small bowel, and omentum  Estimated Blood Loss:  less than 100 mL         Drains: JP drain                      Complications:  None; patient tolerated the procedure well.         Disposition: PACU - hemodynamically stable.         Condition: stable  [name], MD, [facility] Surgery  General/ Trauma Surgery  06/06/2013 11:32 AM

## 2013-06-06 NOTE — Anesthesia Postprocedure Evaluation (Signed)
  Anesthesia Post-op Note  Patient: [name]  Procedure(s) Performed: Procedure(s) (LRB):  OPEN VENTRAL HERNIA REPAIR (N/A) INSERTION OF MESH (N/A)  Patient Location: PACU  Anesthesia Type: General  Level of Consciousness: awake and alert   Airway and Oxygen Therapy: Patient Spontanous Breathing  Post-op Pain: mild  Post-op Assessment: Post-op Vital signs reviewed, Patient's Cardiovascular Status Stable, Respiratory Function Stable, Patent Airway and No signs of Nausea or vomiting  Last Vitals:  Filed Vitals:   06/06/13 1504  BP: 120/73  Pulse: 70  Temp: 36.2 C  Resp: 22    Post-op Vital Signs: stable   Complications: No apparent anesthesia complications

## 2013-06-06 NOTE — Anesthesia Preprocedure Evaluation (Addendum)
Anesthesia Evaluation  Patient identified by MRN, date of birth, ID band Patient awake    Reviewed: Allergy & Precautions, H&P , NPO status , Patient's Chart, lab work & pertinent test results  Airway Mallampati: III TM Distance: <3 FB Neck ROM: Full    Dental no notable dental hx.    Pulmonary COPDCurrent Smoker,  breath sounds clear to auscultation  Pulmonary exam normal       Cardiovascular hypertension, Pt. on medications Rhythm:Regular Rate:Normal     Neuro/Psych negative neurological ROS  negative psych ROS   GI/Hepatic negative GI ROS, Neg liver ROS,   Endo/Other  diabetesHypothyroidism Morbid obesity  Renal/GU Renal InsufficiencyRenal disease  negative genitourinary   Musculoskeletal negative musculoskeletal ROS (+)   Abdominal   Peds negative pediatric ROS (+)  Hematology negative hematology ROS (+)   Anesthesia Other Findings   Reproductive/Obstetrics negative OB ROS                           Anesthesia Physical Anesthesia Plan  ASA: III  Anesthesia Plan: General   Post-op Pain Management:    Induction: Intravenous  Airway Management Planned: Oral ETT  Additional Equipment:   Intra-op Plan:   Post-operative Plan: Extubation in OR  Informed Consent: I have reviewed the patients History and Physical, chart, labs and discussed the procedure including the risks, benefits and alternatives for the proposed anesthesia with the patient or authorized representative who has indicated his/her understanding and acceptance.   Dental advisory given  Plan Discussed with: CRNA and Surgeon  Anesthesia Plan Comments:         Anesthesia Quick Evaluation

## 2013-06-07 ENCOUNTER — Encounter (HOSPITAL_COMMUNITY): Payer: Self-pay | Admitting: Surgery

## 2013-06-07 LAB — CBC
HCT: 36.8 % (ref 36.0–46.0)
Hemoglobin: 12.2 g/dL (ref 12.0–15.0)
MCH: 33.8 pg (ref 26.0–34.0)
MCHC: 33.2 g/dL (ref 30.0–36.0)
RBC: 3.61 MIL/uL — ABNORMAL LOW (ref 3.87–5.11)

## 2013-06-07 LAB — BASIC METABOLIC PANEL
BUN: 40 mg/dL — ABNORMAL HIGH (ref 6–23)
CO2: 18 mEq/L — ABNORMAL LOW (ref 19–32)
Calcium: 9 mg/dL (ref 8.4–10.5)
Creatinine, Ser: 1.84 mg/dL — ABNORMAL HIGH (ref 0.50–1.10)
GFR calc non Af Amer: 30 mL/min — ABNORMAL LOW (ref >90)
Glucose, Bld: 131 mg/dL — ABNORMAL HIGH (ref 70–99)

## 2013-06-07 LAB — GLUCOSE, CAPILLARY: Glucose-Capillary: 135 mg/dL — ABNORMAL HIGH (ref 70–99)

## 2013-06-07 MED ORDER — OXYCODONE-ACETAMINOPHEN 5-325 MG PO TABS: 1.0000 | ORAL_TABLET | ORAL | Status: DC | PRN

## 2013-06-07 MED ORDER — ACETAMINOPHEN 500 MG PO TABS: 1000.0000 mg | ORAL_TABLET | Freq: Four times a day (QID) | ORAL | Status: DC | PRN

## 2013-06-07 NOTE — Progress Notes (Signed)
Clinical Social Work Department BRIEF PSYCHOSOCIAL ASSESSMENT 06/07/2013  Patient:  [name], [name]     Account Number:  [id number]     Admit date:  06/06/2013  Clinical Social Worker:  Candie Chroman  Date/Time:  06/07/2013 11:31 AM  Referred by:  RN  Date Referred:  06/07/2013 Referred for  Other - See comment   Other Referral:   From [facility].   Interview type:   Other interview type:    PSYCHOSOCIAL DATA Living Status:  FACILITY Admitted from facility:   Level of care:   Primary support name:  Kerin Salen Primary support relationship to patient:  SIBLING Degree of support available:   clear    CURRENT CONCERNS Current Concerns  Post-Acute Placement   Other Concerns:    SOCIAL WORK ASSESSMENT / PLAN Pt is a 52 yr old female admitted from [facility] for surgery on 06/06/13. CSW spoke with Jinny Sanders , from [facility] to assist with d/c planning. MD would like to d/c pt back to facility today, if possible. Due to pt's mental health issues MD feels pt would be more comfortable in her familiar environment. Care needs have been discussed with [facility] and home care services for wound care have been  arranged by [facility].   Assessment/plan status:  Psychosocial Support/Ongoing Assessment of Needs Other assessment/ plan:   Information/referral to community resources:    PATIENT'S/FAMILY'S RESPONSE TO PLAN OF CARE: Pt would like to return to her rest home.   Cori Razor LCSW [phone]

## 2013-06-07 NOTE — Care Management Note (Addendum)
    Page 1 of 1   06/07/2013     11:24:31 AM   CARE MANAGEMENT NOTE 06/07/2013  Patient:  [name], [name]   Account Number:  [id number]  Date Initiated:  06/07/2013  Documentation initiated by:  Lorenda Ishihara  Subjective/Objective Assessment:   52 yo female admitted s/p open ventral hernia repair. PTA lived at [facility]     Action/Plan:   Return to facility when stable   Anticipated DC Date:  06/09/2013   Anticipated DC Plan:  GROUP HOME  In-house referral  Clinical Social Worker      DC Associate Professor  CM consult      [facility] Choice  HOME HEALTH   Choice offered to / List presented to:  C-2 HC POA / Guardian        HH arranged  HH-1 RN      [facility] agency  [facility]   Status of service:  Completed, signed off Medicare Important Message given?   (If response is "NO", the following Medicare IM given date fields will be blank) Date Medicare IM given:   Date Additional Medicare IM given:    Discharge Disposition:  GROUP HOME  Per UR Regulation:  Reviewed for med. necessity/level of care/duration of stay  If discussed at Long Length of Stay Meetings, dates discussed:    Comments:  Per discussion with Jinny Sanders, [facility] RN requested for drain care as they do not have staff qualified to care for the JP drain.

## 2013-06-07 NOTE — Discharge Summary (Signed)
Physician Discharge Summary  Patient ID: [name] MRN: [id number] DOB/AGE: 1960/09/18 52 y.o.  Admit date: 06/06/2013 Discharge date: 06/07/2013  Admission Diagnoses:Ventral hernia - incarcerated    Bipolar disorder   Discharge Diagnoses: same Active Problems:   * No active hospital problems. *   Discharged Condition: good  Hospital Course: Open repair of large incarcerated periumbilical ventral hernia  Consults: None  Significant Diagnostic Studies: none  Treatments: surgery: as above  Discharge Exam: Blood pressure 112/70, pulse 89, temperature 97.9 F (36.6 C), temperature source Oral, resp. rate 20, SpO2 93.00%. Dressing dry - drain with serosanguinous output  Disposition: Back to group home Home health nursing to assist with dressing changes/ drain care   Discharge Orders   Future Appointments Provider Department Dept Phone   06/12/2013 3:10 PM [name], MD [facility] Surgery, [place] [phone]   06/22/2013 10:40 AM [name], MD [facility] Surgery, [place] [phone]   07/31/2013 2:30 PM [facility] [phone]   10/23/2013 2:30 PM [facility] [place] [facility] [phone]   01/15/2014 2:30 PM [facility] [place] [facility] [phone]   04/09/2014 1:30 PM Dava Najjar [facility] [phone]   04/09/2014 2:00 PM Artis Delay, MD [place] [facility] [phone]   Future Orders Complete By Expires   Call MD for:  persistant nausea and vomiting  As directed    Call MD for:  redness, tenderness, or signs of infection (pain, swelling, redness, odor or green/yellow discharge around incision site)  As directed    Call MD for:  severe uncontrolled pain  As directed    Call MD for:  temperature >100.4  As directed    Diet general  As directed    Discharge wound care:  As directed     Comments:     Dry dressing over midline staples/ dry gauze around drain site Home health nursing for drain care Sponge baths only.   Driving Restrictions  As directed    Comments:     Do not drive while taking pain medications   Increase activity slowly  As directed    May shower / Bathe  As directed    May walk up steps  As directed        Medication List    STOP taking these medications       HYDROcodone-acetaminophen 5-325 MG per tablet  Commonly known as:  NORCO/VICODIN      TAKE these medications       acetaminophen 500 MG tablet  Commonly known as:  TYLENOL  Take 500 mg by mouth every 6 (six) hours as needed for pain.     atorvastatin 40 MG tablet  Commonly known as:  LIPITOR  Take 40 mg by mouth at bedtime.     b complex-vitamin c-folic acid 0.8 MG Tabs tablet  Take 0.8 mg by mouth every morning.     carvedilol 3.125 MG tablet  Commonly known as:  COREG  Take 3.125 mg by mouth every morning.     divalproex 250 MG DR tablet  Commonly known as:  DEPAKOTE  Take 750 mg by mouth 2 (two) times daily.     esomeprazole 40 MG capsule  Commonly known as:  NEXIUM  Take 40 mg by mouth daily before breakfast.     etodolac 500 MG tablet  Commonly known as:  LODINE  Take 500 mg by mouth 2 (two) times daily as needed.     Fluticasone-Salmeterol 250-50 MCG/DOSE Aepb  Commonly known as:  ADVAIR  Inhale 1 puff into the lungs every 12 (twelve) hours.     glipiZIDE 5 MG 24 hr tablet  Commonly known as:  GLUCOTROL XL  Take 5 mg by mouth every morning.     haloperidol 5 MG tablet  Commonly known as:  HALDOL  Take 2.5 mg by mouth 2 (two) times daily. 1 tab in am and 1/2 tab at hs     levothyroxine 75 MCG tablet  Commonly known as:  SYNTHROID, LEVOTHROID  Take 75 mcg by mouth daily before breakfast.     oxyCODONE-acetaminophen 5-325 MG per tablet  Commonly known as:  PERCOCET/ROXICET  Take 1 tablet by mouth every 4 (four) hours as needed for pain.     PROAIR HFA  108 (90 BASE) MCG/ACT inhaler  Generic drug:  albuterol  Inhale 2 puffs into the lungs 4 (four) times daily.     ramipril 2.5 MG capsule  Commonly known as:  ALTACE  Take 2.5 mg by mouth every morning.     tiotropium 18 MCG inhalation capsule  Commonly known as:  SPIRIVA  Place 18 mcg into inhaler and inhale daily.     tiZANidine 2 MG tablet  Commonly known as:  ZANAFLEX  Take 2 mg by mouth 3 (three) times daily as needed.     traMADol 50 MG tablet  Commonly known as:  ULTRAM  Take 50 mg by mouth every 6 (six) hours as needed for pain.     Vitamin D3 1000 UNITS Caps  Take 1,000 Units by mouth daily.           Follow-up Information   Follow up with Wynona Luna., MD In 1 week.   Specialty:  General Surgery   Contact information:   [address] [place] [address] [phone]       Signed: Wynona Luna. 06/07/2013, 11:18 AM

## 2013-06-07 NOTE — Progress Notes (Signed)
1 Day Post-Op  Subjective: Patient did very poorly overnight from a psychiatric standpoint - confused, aggressive, combative Much calmer now.  Pain controlled with PO pain meds Foley still in place with clear yellow urine  Objective: Vital signs in last 24 hours: Temp:  [97.1 F (36.2 C)-98 F (36.7 C)] 97.9 F (36.6 C) (10/09 0525) Pulse Rate:  [69-92] 89 (10/09 0525) Resp:  [12-25] 20 (10/09 0525) BP: (107-139)/(47-92) 112/70 mmHg (10/09 0525) SpO2:  [79 %-99 %] 93 % (10/09 0525)    Intake/Output from previous day: 10/08 0701 - 10/09 0700 In: 4920 [P.O.:3820; I.V.:1100] Out: 3370 [Urine:3220; Drains:100; Blood:50] Intake/Output this shift:    General appearance: alert, cooperative and no distress Dressing dry; JP drain with thin sanguinous fluid  Lab Results:   Recent Labs  06/06/13 1448 06/07/13 0455  WBC 5.6 7.8  HGB 11.9* 12.2  HCT 36.2 36.8  PLT 133* 126*   BMET  Recent Labs  06/04/13 1345 06/06/13 1448 06/07/13 0455  NA 136  --  134*  K 3.9  --  4.0  CL 103  --  104  CO2 24  --  18*  GLUCOSE 104*  --  131*  BUN 37*  --  40*  CREATININE 1.93* 1.88* 1.84*  CALCIUM 9.2  --  9.0   PT/INR No results found for this basename: LABPROT, INR,  in the last 72 hours ABG No results found for this basename: PHART, PCO2, PO2, HCO3,  in the last 72 hours  Studies/Results: No results found.  Anti-infectives: Anti-infectives   Start     Dose/Rate Route Frequency Ordered Stop   06/06/13 0801  vancomycin (VANCOCIN) 1,500 mg in sodium chloride 0.9 % 500 mL IVPB     1,500 mg 250 mL/hr over 120 Minutes Intravenous On call to O.R. 06/06/13 0801 06/06/13 0929      Assessment/Plan: s/p Procedure(s):  OPEN VENTRAL HERNIA REPAIR (N/A) INSERTION OF MESH (N/A) d/c foley Possible discharge later today.   Due to her psychiatric condition, she would be better served in a familiar environment.  She will have to go with the drain in place for several days to prevent  seroma formation. Will evaluate later today.   LOS: 1 day    Kimberlye Dilger K. 06/07/2013

## 2013-06-07 NOTE — Progress Notes (Signed)
Nurse spoke with Ms. Davis at the group home where pt is to be discharged to.  Nurse explained to Ms. Earlene Plater the pt's current condition and her discharge status.  A home health nurse will be aiding pt with JP drain care.  Ms. Earlene Plater stated it was ok for pt to come back and that somebody will be here around 5pm to pick up the pt.  Questions from Ms. Davis regarding drain care were answered by nurse.

## 2013-06-12 ENCOUNTER — Encounter (INDEPENDENT_AMBULATORY_CARE_PROVIDER_SITE_OTHER): Payer: Medicare Other | Admitting: Surgery

## 2013-06-13 ENCOUNTER — Telehealth (INDEPENDENT_AMBULATORY_CARE_PROVIDER_SITE_OTHER): Payer: Self-pay

## 2013-06-13 NOTE — Telephone Encounter (Signed)
Donna[care giver to pt] called stating pt has had several staples come out of wound and wound is opening. Reviewed with Dr Corliss Skains. Per Dr Fatima Sanger request pt placed in urgent office tomorrow for wound ck. Advised to keep wound covered with clean dry dsg until pt seen in office tomorrow. [name] states she understands and will have pt keep appt tomorrow.

## 2013-06-14 ENCOUNTER — Encounter (INDEPENDENT_AMBULATORY_CARE_PROVIDER_SITE_OTHER): Payer: Self-pay | Admitting: General Surgery

## 2013-06-14 ENCOUNTER — Other Ambulatory Visit (INDEPENDENT_AMBULATORY_CARE_PROVIDER_SITE_OTHER): Payer: Self-pay | Admitting: General Surgery

## 2013-06-14 ENCOUNTER — Ambulatory Visit (INDEPENDENT_AMBULATORY_CARE_PROVIDER_SITE_OTHER): Payer: Medicare Other | Admitting: General Surgery

## 2013-06-14 VITALS — BP 132/68 | HR 72 | Temp 97.0°F | Resp 15 | Ht 66.0 in | Wt 246.0 lb

## 2013-06-14 DIAGNOSIS — T8130XA Disruption of wound, unspecified, initial encounter: Secondary | ICD-10-CM

## 2013-06-14 DIAGNOSIS — T8131XA Disruption of external operation (surgical) wound, not elsewhere classified, initial encounter: Secondary | ICD-10-CM

## 2013-06-14 NOTE — Patient Instructions (Signed)
You will need to have a daily dressing change by nurses.

## 2013-06-14 NOTE — Progress Notes (Signed)
Procedure:  Repair of ventral hernia with mesh  Date:  06/06/2013  Pathology:  na  History:  She presents to the urgent off this today because she's had drainage from the abdominal wound for the past 3 days. The drain is not tell the discharge. A bandage was taken off today and separation of the wound was noted. A nurses been coming out to see her twice a week.  Exam: General- Is in NAD. Abdomen-drain is present and is not pulling at discharge. Midportion was opened with exposed mesh noted. Serous drainage is coming from the wound. No erythema or purulence. The drain was removed. The wound was packed with saline moistened gauze after all the staples were removed and benzoin and Steri-Strips were applied. The open area of the wound was left open.  Assessment:  Superficial wound separation. Mesh appears to be exposed. This appears to have been going on for at least 3 days.  Plan:  Start normal saline damp to dry dressing changes. No indication for antibiotics. Return visit with Dr. Corliss Skains next week.

## 2013-06-15 ENCOUNTER — Telehealth (INDEPENDENT_AMBULATORY_CARE_PROVIDER_SITE_OTHER): Payer: Self-pay | Admitting: General Surgery

## 2013-06-15 NOTE — Telephone Encounter (Signed)
Katy from [facility] called to see if there were any changes with the patients home health after her appt yesterday.  Explained that the plan as of yesterday is to do damp to dry dressing changes daily.  She explained that she will note these changes in her chart.  She also wanted to let us know that the past two visits the patient has not been present in the home for the home health care team to change dressings.  She explained that she was going to try and get in touch with the patient again today to see if she could teach her how to do the dressing changes herself.

## 2013-06-20 ENCOUNTER — Encounter (INDEPENDENT_AMBULATORY_CARE_PROVIDER_SITE_OTHER): Payer: Self-pay | Admitting: Surgery

## 2013-06-20 ENCOUNTER — Ambulatory Visit (INDEPENDENT_AMBULATORY_CARE_PROVIDER_SITE_OTHER): Payer: Medicare Other | Admitting: Surgery

## 2013-06-20 VITALS — BP 132/76 | HR 72 | Temp 97.8°F | Resp 16 | Ht 66.0 in | Wt 243.6 lb

## 2013-06-20 DIAGNOSIS — Z5189 Encounter for other specified aftercare: Secondary | ICD-10-CM

## 2013-06-20 DIAGNOSIS — K436 Other and unspecified ventral hernia with obstruction, without gangrene: Secondary | ICD-10-CM

## 2013-06-20 DIAGNOSIS — T8131XD Disruption of external operation (surgical) wound, not elsewhere classified, subsequent encounter: Secondary | ICD-10-CM

## 2013-06-20 NOTE — Progress Notes (Signed)
This patient is status post open repair of a fairly massive periumbilical ventral hernia with incarcerated small bowel. The defect was only about 6 cm across but the hernia sac was 25 cm across. Her hernia was repaired with an 11 cm round mesh. The fascia was closed anterior to the mesh. A drain was placed in the subcutaneous space. However the patient came to the office last week because of drainage from her incision. She had superficial dehiscence of her incision with an infection the subcutaneous space. There is a question of possible exposed mesh. Her healthcare assistant has been performing twice a day dressing changes.  The patient reports good appetite and no significant abdominal pain.  Filed Vitals:   06/20/13 1423  BP: 132/76  Pulse: 72  Temp: 97.8 F (36.6 C)  Resp: 16   Her incision is intact for the lowest 4 cm. The upper 10 cm is opened with some undermining of the Size of the incision. On my examination it appears that the fascia is intact. I cannot visualize any mesh. She is beginning to form some granulation tissue. There is no significant foul-smelling drainage but there is some yellowish drainage from the size of the wound. We repacked the wound with saline moistened Kerlix gauze. An ABG was used to cover the dressing. Her home health caregiver will continue with the twice a day dressing changes. We'll recheck her in 2 weeks.  [name], MD, [facility] Surgery  General/ Trauma Surgery  06/20/2013 3:41 PM

## 2013-06-22 ENCOUNTER — Encounter (INDEPENDENT_AMBULATORY_CARE_PROVIDER_SITE_OTHER): Payer: Medicare Other | Admitting: Surgery

## 2013-07-03 ENCOUNTER — Encounter (INDEPENDENT_AMBULATORY_CARE_PROVIDER_SITE_OTHER): Payer: Self-pay | Admitting: Surgery

## 2013-07-03 ENCOUNTER — Ambulatory Visit (INDEPENDENT_AMBULATORY_CARE_PROVIDER_SITE_OTHER): Payer: Medicare Other | Admitting: Surgery

## 2013-07-03 VITALS — BP 140/86 | HR 68 | Resp 16 | Ht 66.0 in | Wt 235.2 lb

## 2013-07-03 DIAGNOSIS — Z5189 Encounter for other specified aftercare: Secondary | ICD-10-CM

## 2013-07-03 DIAGNOSIS — T8131XD Disruption of external operation (surgical) wound, not elsewhere classified, subsequent encounter: Secondary | ICD-10-CM

## 2013-07-03 NOTE — Progress Notes (Signed)
Filed Vitals:   07/03/13 1600  BP: 140/86  Pulse: 68  Resp: 16   The patient returns for recheck of her wound. Her caretaker, [name], is doing a great job with daily dressing changes. The left side of the wound shows that the skin is inverting a little bit. I encouraged [name] to packed underneath the skin flap to hold the anterior. The wound is much smaller and is granulating well. She still has some purulent drainage. No surrounding cellulitis. The patient may shower before dressing changes. Recheck in 3 weeks.   [name], MD, [facility] Surgery  General/ Trauma Surgery  07/03/2013 4:11 PM

## 2013-07-23 ENCOUNTER — Encounter (INDEPENDENT_AMBULATORY_CARE_PROVIDER_SITE_OTHER): Payer: Self-pay | Admitting: Surgery

## 2013-07-23 ENCOUNTER — Ambulatory Visit (INDEPENDENT_AMBULATORY_CARE_PROVIDER_SITE_OTHER): Payer: Medicare Other | Admitting: Surgery

## 2013-07-23 VITALS — BP 127/81 | HR 80 | Temp 98.6°F | Resp 14 | Ht 66.0 in | Wt 225.2 lb

## 2013-07-23 DIAGNOSIS — T8131XD Disruption of external operation (surgical) wound, not elsewhere classified, subsequent encounter: Secondary | ICD-10-CM

## 2013-07-23 DIAGNOSIS — Z5189 Encounter for other specified aftercare: Secondary | ICD-10-CM

## 2013-07-23 NOTE — Progress Notes (Signed)
She is here for a wound check. The wound is very clean. It got much smaller. It is about 1.5 x 2 cm x 1.5 cm deep. It is completely granulated. Minimal drainage. No surrounding cellulitis. She should continue with wet-to-dry dressings until the wound is completely filled with granulation tissue, then she may switch to Neosporin ointment. I gave her caregiver, [name], instructions for wound care. Recheck in 3 weeks.  [name]. Corliss Skains, MD, [facility] Surgery  General/ Trauma Surgery  07/23/2013 3:34 PM

## 2013-07-31 ENCOUNTER — Other Ambulatory Visit: Payer: Medicare Other | Admitting: Lab

## 2013-07-31 ENCOUNTER — Telehealth (INDEPENDENT_AMBULATORY_CARE_PROVIDER_SITE_OTHER): Payer: Self-pay | Admitting: General Surgery

## 2013-07-31 NOTE — Telephone Encounter (Signed)
Katie, nurse with [facility], called for orders to continue home care.  Gave verbal orders to continue and she will FAX over the paperwork for signature.

## 2013-08-15 ENCOUNTER — Ambulatory Visit (INDEPENDENT_AMBULATORY_CARE_PROVIDER_SITE_OTHER): Payer: Medicare Other | Admitting: Surgery

## 2013-08-15 ENCOUNTER — Encounter (INDEPENDENT_AMBULATORY_CARE_PROVIDER_SITE_OTHER): Payer: Self-pay | Admitting: Surgery

## 2013-08-15 VITALS — BP 133/86 | HR 78 | Temp 97.7°F | Resp 14 | Ht 66.0 in | Wt 219.2 lb

## 2013-08-15 DIAGNOSIS — T8131XD Disruption of external operation (surgical) wound, not elsewhere classified, subsequent encounter: Secondary | ICD-10-CM

## 2013-08-15 DIAGNOSIS — Z5189 Encounter for other specified aftercare: Secondary | ICD-10-CM

## 2013-08-15 DIAGNOSIS — K436 Other and unspecified ventral hernia with obstruction, without gangrene: Secondary | ICD-10-CM

## 2013-08-15 NOTE — Progress Notes (Signed)
She is here for another wound check. Her incision is almost completely healed. It has a very thin strip of exposed granulation tissue. I may treat this with Neosporin for a few more days until it dries up completely. No sign of recurrent hernia. Followup when necessary.  [name], MD, [facility] Surgery  General/ Trauma Surgery  08/15/2013 2:40 PM

## 2013-08-16 ENCOUNTER — Telehealth (INDEPENDENT_AMBULATORY_CARE_PROVIDER_SITE_OTHER): Payer: Self-pay | Admitting: *Deleted

## 2013-08-16 NOTE — Telephone Encounter (Signed)
Katie with [facility] called to ask about changes to wound orders.  Read Katie office visit note from Dr. Corliss Skains on 08/15/13 which states to just use some Neosporin.  She states understanding at this time.

## 2013-08-17 ENCOUNTER — Telehealth (INDEPENDENT_AMBULATORY_CARE_PROVIDER_SITE_OTHER): Payer: Self-pay

## 2013-08-17 NOTE — Telephone Encounter (Signed)
Katie from [facility] called to let Dr Corliss Skains know pt was not home today for her [facility] visit.

## 2013-08-28 ENCOUNTER — Other Ambulatory Visit: Payer: Medicare Other

## 2013-09-25 ENCOUNTER — Other Ambulatory Visit: Payer: Medicare Other

## 2013-10-23 ENCOUNTER — Other Ambulatory Visit: Payer: Medicare Other

## 2013-11-10 IMAGING — CR DG CHEST 2V
2 series · 2 of 2 positions shown · non-contrast
Comparison: 03/21/2012

CLINICAL DATA: Preoperative exam, ventral hernia repair

EXAM:
CHEST  2 VIEW

[w chest pa]
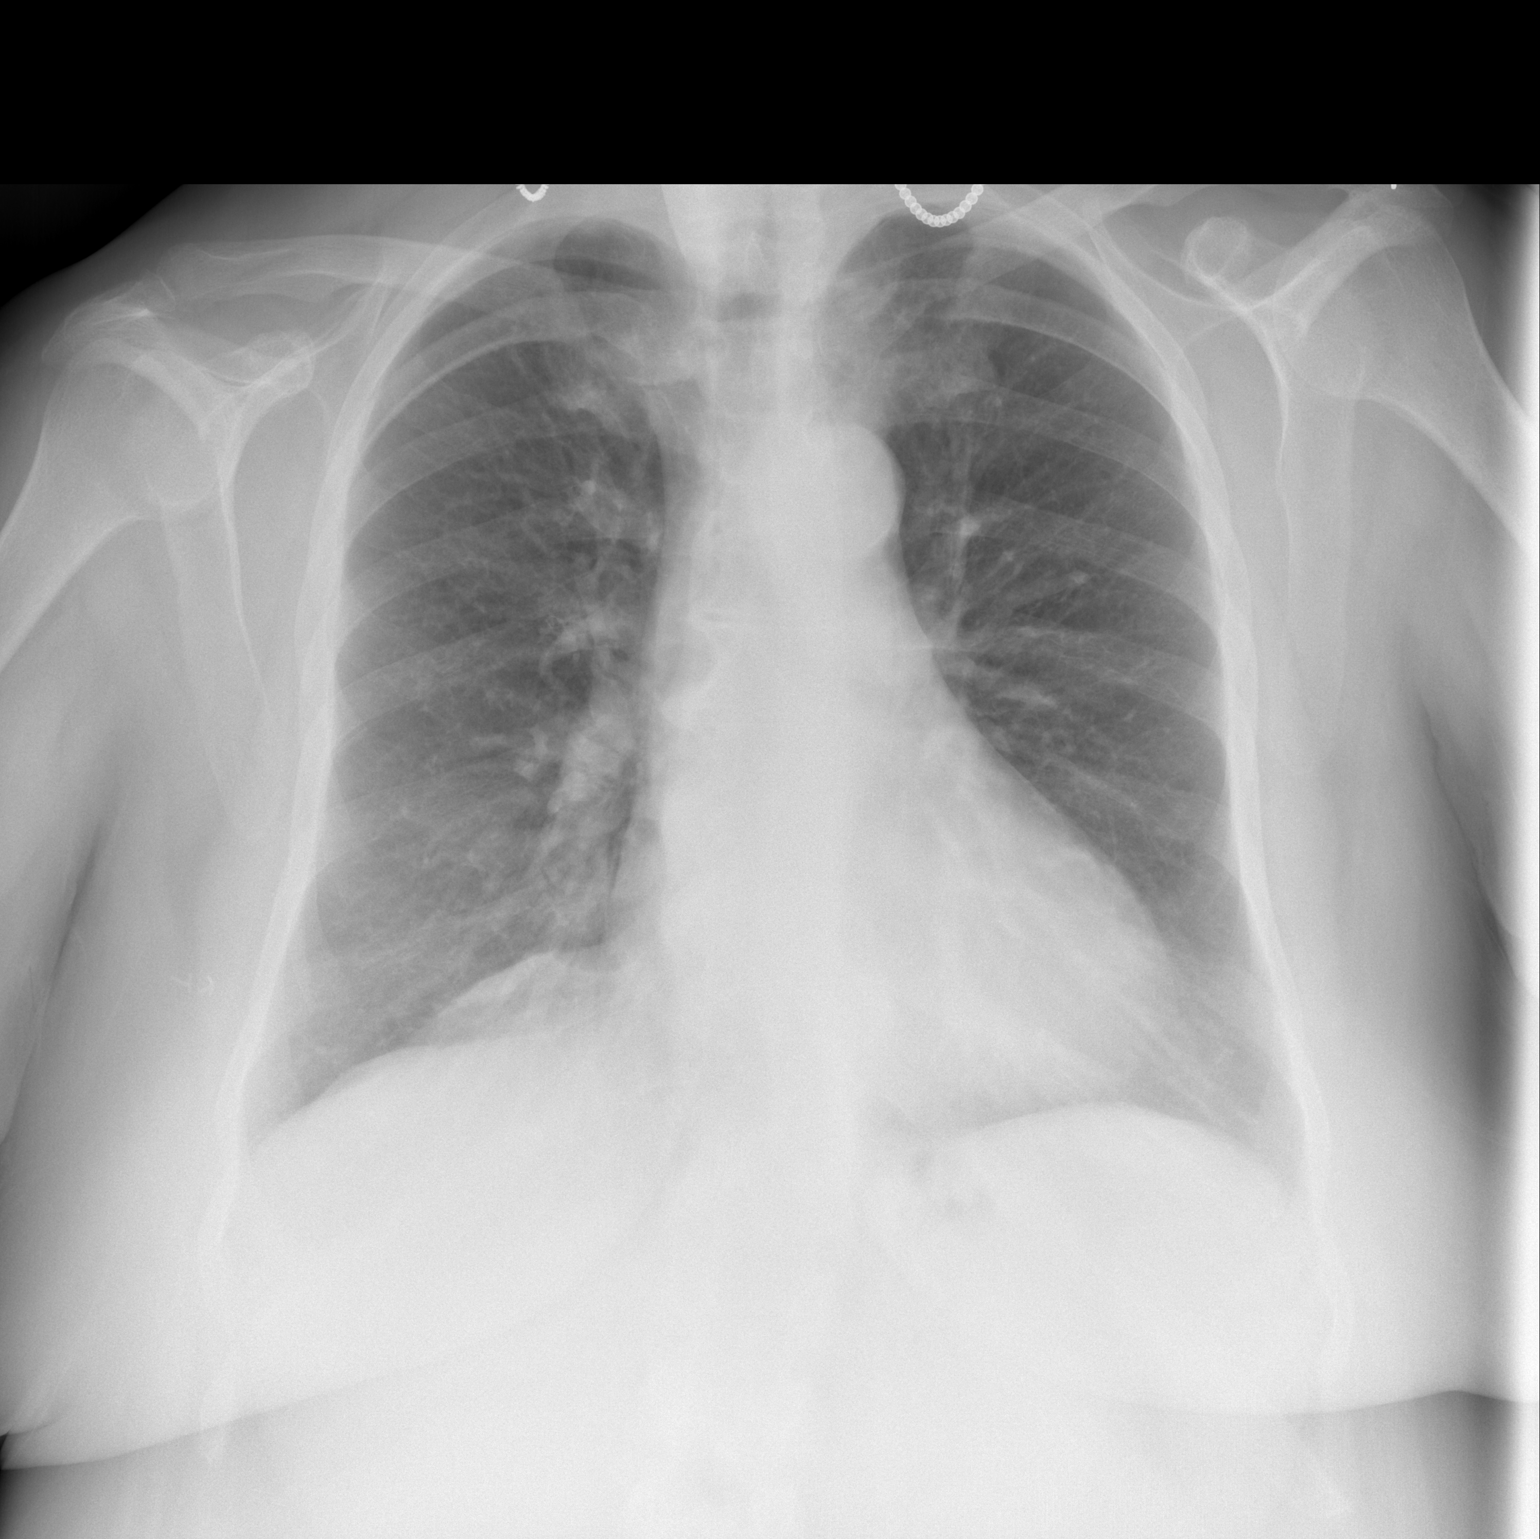

[w chest lat]
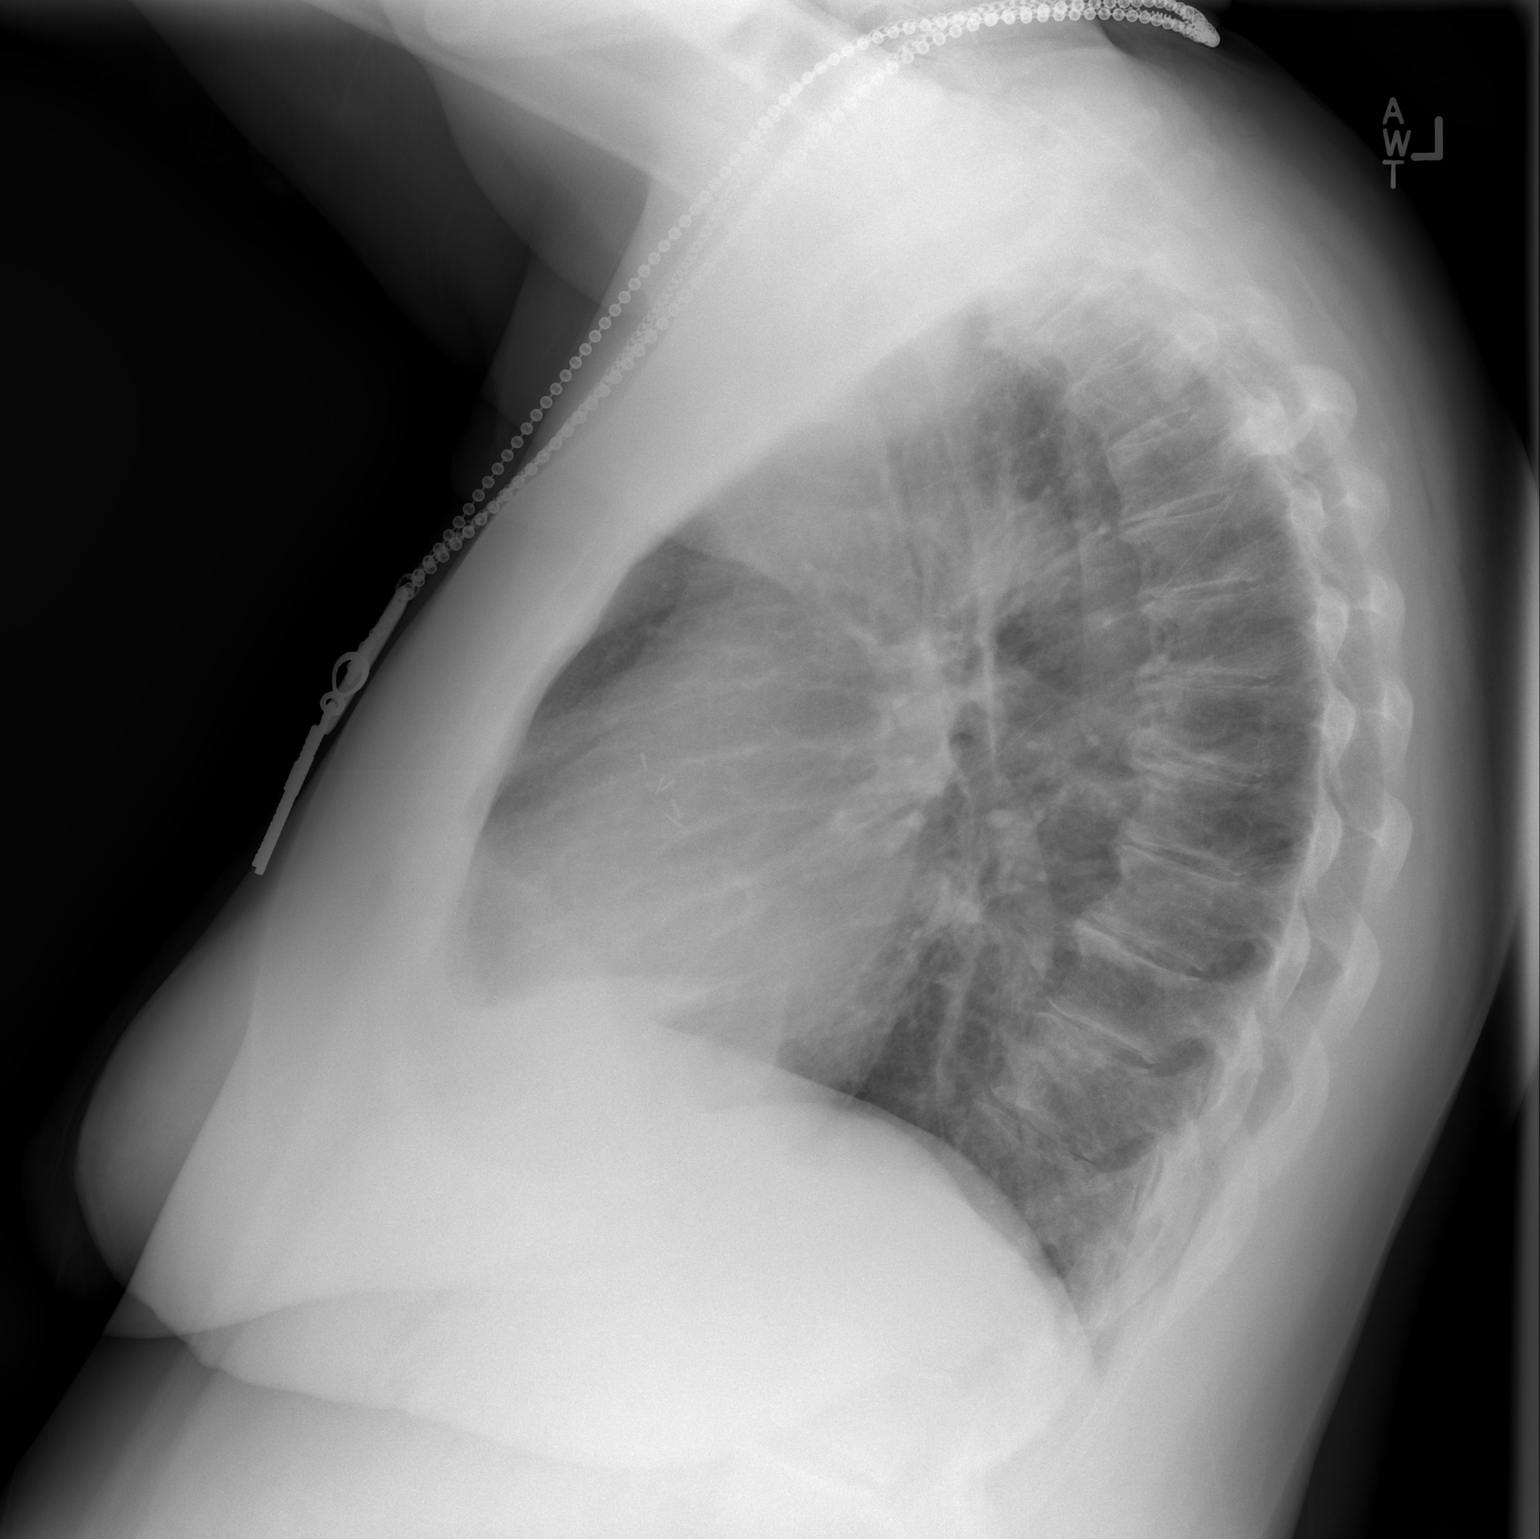

[2 of 2 positions shown; findings below may reference images not displayed]

FINDINGS: The heart size is at upper limits of normal, unchanged. Both lungs
are clear. The visualized skeletal structures are unremarkable.
IMPRESSION: No active cardiopulmonary disease.

## 2013-11-20 ENCOUNTER — Other Ambulatory Visit (HOSPITAL_COMMUNITY): Payer: Self-pay | Admitting: Internal Medicine

## 2014-01-15 ENCOUNTER — Other Ambulatory Visit (HOSPITAL_BASED_OUTPATIENT_CLINIC_OR_DEPARTMENT_OTHER): Payer: Medicare Other

## 2014-01-15 DIAGNOSIS — D696 Thrombocytopenia, unspecified: Secondary | ICD-10-CM

## 2014-01-15 LAB — CBC WITH DIFFERENTIAL/PLATELET
BASO%: 0.3 % (ref 0.0–2.0)
Basophils Absolute: 0 10*3/uL (ref 0.0–0.1)
EOS%: 2.1 % (ref 0.0–7.0)
Eosinophils Absolute: 0.1 10*3/uL (ref 0.0–0.5)
HCT: 32.4 % — ABNORMAL LOW (ref 34.8–46.6)
HGB: 10.8 g/dL — ABNORMAL LOW (ref 11.6–15.9)
LYMPH%: 34.7 % (ref 14.0–49.7)
MCH: 34.1 pg — AB (ref 25.1–34.0)
MCHC: 33.5 g/dL (ref 31.5–36.0)
MCV: 101.8 fL — ABNORMAL HIGH (ref 79.5–101.0)
MONO#: 0.4 10*3/uL (ref 0.1–0.9)
MONO%: 6.8 % (ref 0.0–14.0)
NEUT#: 3.3 10*3/uL (ref 1.5–6.5)
NEUT%: 56.1 % (ref 38.4–76.8)
Platelets: 150 10*3/uL (ref 145–400)
RBC: 3.18 10*6/uL — AB (ref 3.70–5.45)
RDW: 13.2 % (ref 11.2–14.5)
WBC: 5.8 10*3/uL (ref 3.9–10.3)
lymph#: 2 10*3/uL (ref 0.9–3.3)

## 2014-02-14 ENCOUNTER — Telehealth: Payer: Self-pay | Admitting: Hematology and Oncology

## 2014-02-14 NOTE — Telephone Encounter (Signed)
pt calledl to r/s appt time...done pt aware of new d.t

## 2014-04-09 ENCOUNTER — Other Ambulatory Visit: Payer: Medicare Other

## 2014-04-09 ENCOUNTER — Ambulatory Visit (HOSPITAL_BASED_OUTPATIENT_CLINIC_OR_DEPARTMENT_OTHER): Payer: Medicare Other | Admitting: Hematology and Oncology

## 2014-04-09 ENCOUNTER — Encounter: Payer: Self-pay | Admitting: Hematology and Oncology

## 2014-04-09 ENCOUNTER — Other Ambulatory Visit: Payer: Self-pay | Admitting: Hematology and Oncology

## 2014-04-09 ENCOUNTER — Telehealth: Payer: Self-pay | Admitting: Hematology and Oncology

## 2014-04-09 ENCOUNTER — Ambulatory Visit: Payer: Medicare Other | Admitting: Hematology and Oncology

## 2014-04-09 ENCOUNTER — Other Ambulatory Visit (HOSPITAL_BASED_OUTPATIENT_CLINIC_OR_DEPARTMENT_OTHER): Payer: Medicare Other

## 2014-04-09 VITALS — BP 113/70 | HR 62 | Temp 97.9°F | Resp 18 | Ht 66.0 in | Wt 219.8 lb

## 2014-04-09 DIAGNOSIS — D631 Anemia in chronic kidney disease: Secondary | ICD-10-CM

## 2014-04-09 DIAGNOSIS — N189 Chronic kidney disease, unspecified: Secondary | ICD-10-CM

## 2014-04-09 DIAGNOSIS — D696 Thrombocytopenia, unspecified: Secondary | ICD-10-CM

## 2014-04-09 DIAGNOSIS — N039 Chronic nephritic syndrome with unspecified morphologic changes: Secondary | ICD-10-CM

## 2014-04-09 DIAGNOSIS — F17200 Nicotine dependence, unspecified, uncomplicated: Secondary | ICD-10-CM

## 2014-04-09 DIAGNOSIS — N289 Disorder of kidney and ureter, unspecified: Secondary | ICD-10-CM

## 2014-04-09 LAB — RETICULOCYTES
IMMATURE RETIC FRACT: 7.7 % (ref 1.60–10.00)
RBC: 3.41 10*6/uL — AB (ref 3.70–5.45)
RETIC %: 1.42 % (ref 0.70–2.10)
RETIC CT ABS: 48.42 10*3/uL (ref 33.70–90.70)

## 2014-04-09 LAB — CBC WITH DIFFERENTIAL/PLATELET
BASO%: 0.3 % (ref 0.0–2.0)
Basophils Absolute: 0 10*3/uL (ref 0.0–0.1)
EOS%: 1.7 % (ref 0.0–7.0)
Eosinophils Absolute: 0.1 10*3/uL (ref 0.0–0.5)
HEMATOCRIT: 34.6 % — AB (ref 34.8–46.6)
HGB: 11.4 g/dL — ABNORMAL LOW (ref 11.6–15.9)
LYMPH#: 2.1 10*3/uL (ref 0.9–3.3)
LYMPH%: 33.4 % (ref 14.0–49.7)
MCH: 33.1 pg (ref 25.1–34.0)
MCHC: 32.8 g/dL (ref 31.5–36.0)
MCV: 100.8 fL (ref 79.5–101.0)
MONO#: 0.5 10*3/uL (ref 0.1–0.9)
MONO%: 7.4 % (ref 0.0–14.0)
NEUT#: 3.6 10*3/uL (ref 1.5–6.5)
NEUT%: 57.2 % (ref 38.4–76.8)
Platelets: 142 10*3/uL — ABNORMAL LOW (ref 145–400)
RBC: 3.43 10*6/uL — ABNORMAL LOW (ref 3.70–5.45)
RDW: 12.7 % (ref 11.2–14.5)
WBC: 6.2 10*3/uL (ref 3.9–10.3)

## 2014-04-09 NOTE — Assessment & Plan Note (Signed)
This is chronic in nature. This is likely due to the medication. She is not symptomatic. She can be observed.  

## 2014-04-09 NOTE — Assessment & Plan Note (Signed)
I spent some time counseling the patient the importance of tobacco cessation. she is not interested to quit.

## 2014-04-09 NOTE — Assessment & Plan Note (Signed)
She follows with the nephrologist closely.

## 2014-04-09 NOTE — Progress Notes (Signed)
[facility] progress notes  Patient Care Team: Benito Mccreedy, MD as PCP - General (Internal Medicine) Heath Lark, MD as Consulting Physician (Hematology and Oncology)  CHIEF COMPLAINTS/PURPOSE OF VISIT:  Thrombocytopenia  HISTORY OF PRESENTING ILLNESS:  [name] 53 y.o. female was transferred to my care after her prior physician has left.  I reviewed the patient's records extensive and collaborated the history with the patient. Summary of her history is as follows: This patient was seen here for chronic thrombocytopenia. She also has remote history of breast cancer status post radiation treatment. She had chronic bipolar disorder and on multiple different medications. Medications were thought to be the cause of her chronic thrombocytopenia. The patient is not symptomatic. She is observed. The patient denies any recent signs or symptoms of bleeding such as spontaneous epistaxis, hematuria or hematochezia. She denies any recent abnormal breast examination, palpable mass, abnormal breast appearance or nipple changes She has chronic ventral hernia status post repair with delay wound healing. MEDICAL HISTORY:  Past Medical History  Diagnosis Date  . Anemia 07/10/2011  . Thrombocytopenia 07/10/2011  . HTN (hypertension)   . COPD (chronic obstructive pulmonary disease)   . Hyperlipidemia   . Hypothyroidism   . Bipolar affective   . DM (diabetes mellitus)   . Renal insufficiency   . Cancer 2012    RIGHT BREAST, RADIATION DONE, NO CHEMO    SURGICAL HISTORY: Past Surgical History  Procedure Laterality Date  . Ankle surgery Left 1989  . Tonsillectomy  AGE 69  . Breast surgery Right     BREAST  . Ventral hernia repair N/A 06/06/2013    Procedure:  OPEN VENTRAL HERNIA REPAIR;  Surgeon: Imogene Burn. Georgette Dover, MD;  Location[facility];  Service: General;  Laterality: N/A;  . Insertion of mesh N/A 06/06/2013    Procedure: INSERTION OF MESH;  Surgeon: Imogene Burn.  Georgette Dover, MD;  Location: [facility];  Service: General;  Laterality: N/A;    SOCIAL HISTORY: History   Social History  . Marital Status: Divorced    Spouse Name: N/A    Number of Children: N/A  . Years of Education: N/A   Occupational History  . Not on file.   Social History Main Topics  . Smoking status: Current Every Day Smoker -- 0.50 packs/day for 40 years    Types: Cigarettes  . Smokeless tobacco: Never Used  . Alcohol Use: No  . Drug Use: No  . Sexual Activity: Not on file   Other Topics Concern  . Not on file   Social History Narrative  . No narrative on file    FAMILY HISTORY: Family History  Problem Relation Age of Onset  . Diabetes Mother   . Cancer Father   . HIV Brother     ALLERGIES:  is allergic to doxycycline; glipizide; ibuprofen; oxybutynin chloride; penicillins; sulfonamide derivatives; tetracycline; tolterodine tartrate; and chlorostat.  MEDICATIONS:  Current Outpatient Prescriptions  Medication Sig Dispense Refill  . atorvastatin (LIPITOR) 40 MG tablet Take 40 mg by mouth at bedtime.       . divalproex (DEPAKOTE) 250 MG DR tablet Take 750 mg by mouth 2 (two) times daily.      Marland Kitchen esomeprazole (NEXIUM) 40 MG capsule Take 40 mg by mouth daily before breakfast.      . Fluticasone-Salmeterol (ADVAIR) 250-50 MCG/DOSE AEPB Inhale 1 puff into the lungs every 12 (twelve) hours.      . haloperidol (HALDOL) 5 MG tablet Take 2.5 mg by  mouth 2 (two) times daily. 1 tab in am and 1/2 tab at hs      . levothyroxine (SYNTHROID, LEVOTHROID) 75 MCG tablet Take 75 mcg by mouth daily before breakfast.       . PROAIR HFA 108 (90 BASE) MCG/ACT inhaler Inhale 2 puffs into the lungs 4 (four) times daily.       . ramipril (ALTACE) 2.5 MG capsule Take 2.5 mg by mouth every morning.       . tiotropium (SPIRIVA) 18 MCG inhalation capsule Place 18 mcg into inhaler and inhale daily.      Marland Kitchen acetaminophen (TYLENOL) 500 MG tablet Take 500 mg by mouth every 6 (six) hours as needed for  pain.      Marland Kitchen b complex-vitamin c-folic acid (NEPHRO-VITE) 0.8 MG TABS tablet Take 0.8 mg by mouth every morning.      . carvedilol (COREG) 3.125 MG tablet Take 3.125 mg by mouth every morning.       . Cholecalciferol (VITAMIN D3) 1000 UNITS CAPS Take 1,000 Units by mouth daily.       Marland Kitchen etodolac (LODINE) 500 MG tablet Take 500 mg by mouth 2 (two) times daily as needed.      Marland Kitchen glipiZIDE (GLUCOTROL XL) 5 MG 24 hr tablet Take 5 mg by mouth every morning.       Marland Kitchen oxyCODONE-acetaminophen (PERCOCET/ROXICET) 5-325 MG per tablet Take 1 tablet by mouth every 4 (four) hours as needed for pain.  40 tablet  0  . tiZANidine (ZANAFLEX) 2 MG tablet Take 2 mg by mouth 3 (three) times daily as needed.      . traMADol (ULTRAM) 50 MG tablet Take 50 mg by mouth every 6 (six) hours as needed for pain.       No current facility-administered medications for this visit.    REVIEW OF SYSTEMS:   Constitutional: Denies fevers, chills or abnormal night sweats Eyes: Denies blurriness of vision, double vision or watery eyes Ears, nose, mouth, throat, and face: Denies mucositis or sore throat Respiratory: Denies cough, dyspnea or wheezes Cardiovascular: Denies palpitation, chest discomfort or lower extremity swelling Gastrointestinal:  Denies nausea, heartburn or change in bowel habits Skin: Denies abnormal skin rashes Lymphatics: Denies new lymphadenopathy or easy bruising Neurological:Denies numbness, tingling or new weaknesses Behavioral/Psych: Mood is stable, no new changes  All other systems were reviewed with the patient and are negative.  PHYSICAL EXAMINATION: ECOG PERFORMANCE STATUS: 0 - Asymptomatic  Filed Vitals:   04/09/14 1346  BP: 113/70  Pulse: 62  Temp: 97.9 F (36.6 C)  Resp: 18   Filed Weights   04/09/14 1346  Weight: 219 lb 12.8 oz (99.701 kg)    GENERAL:alert, no distress and comfortable. She is morbidly obese SKIN: skin color, texture, turgor are normal, no rashes or significant  lesions EYES: normal, conjunctiva are pink and non-injected, sclera clear OROPHARYNX:no exudate, normal lips, buccal mucosa, and tongue  NECK: supple, thyroid normal size, non-tender, without nodularity LYMPH:  no palpable lymphadenopathy in the cervical, axillary or inguinal LUNGS: clear to auscultation and percussion with normal breathing effort HEART: regular rate & rhythm and no murmurs without lower extremity edema ABDOMEN:abdomen soft, non-tender and normal bowel sounds. Examination is limited due to morbid obesity. Musculoskeletal:no cyanosis of digits and no clubbing  PSYCH: alert & oriented x 3 with fluent speech NEURO: no focal motor/sensory deficits  LABORATORY DATA:  I have reviewed the data as listed Lab Results  Component Value Date   WBC 6.2 04/09/2014  HGB 11.4* 04/09/2014   HCT 34.6* 04/09/2014   MCV 100.8 04/09/2014   PLT 142* 04/09/2014    Recent Labs  05/07/13 1415 06/04/13 1345 06/06/13 1448 06/07/13 0455  NA 141 136  --  134*  K 4.0 3.9  --  4.0  CL  --  103  --  104  CO2 26 24  --  18*  GLUCOSE 81 104*  --  131*  BUN 29.1* 37*  --  40*  CREATININE 1.8* 1.93* 1.88* 1.84*  CALCIUM 9.0 9.2  --  9.0  GFRNONAA  --  28* 29* 30*  GFRAA  --  33* 34* 35*  PROT 6.0*  --   --   --   ALBUMIN 2.7*  --   --   --   AST 23  --   --   --   ALT 18  --   --   --   ALKPHOS 85  --   --   --   BILITOT 0.24  --   --   --    ASSESSMENT & PLAN:  Thrombocytopenia This is chronic in nature. This is likely due to the medication. She is not symptomatic. She can be observed.  Anemia in chronic kidney disease(285.21) This is likely anemia of chronic disease. The patient denies recent history of bleeding such as epistaxis, hematuria or hematochezia. She is asymptomatic from the anemia. We will observe for now.   There is element of macrocytosis. Prior vitamin B12 were within normal limits. I suspect she may have early fatty liver disease. Again this does not require  further treatment and this can be observed.  SMOKER I spent some time counseling the patient the importance of tobacco cessation. she is not interested to quit.   Orders Placed This Encounter  Procedures  . CBC with Differential    Standing Status: Future     Number of Occurrences:      Standing Expiration Date: 05/14/2015  . Comprehensive metabolic panel    Standing Status: Future     Number of Occurrences:      Standing Expiration Date: 05/14/2015  . Morphology    Standing Status: Future     Number of Occurrences:      Standing Expiration Date: 05/14/2015    All questions were answered. The patient knows to call the clinic with any problems, questions or concerns. I spent 15 minutes counseling the patient face to face. The total time spent in the appointment was 20 minutes and more than 50% was on counseling.     [place], [place], MD 04/09/2014 2:00 PM

## 2014-04-09 NOTE — Telephone Encounter (Signed)
Pt confirmed labs/ov per 08/11 POF, gave pt AVS...KJ °

## 2014-04-09 NOTE — Assessment & Plan Note (Signed)
This is likely anemia of chronic disease. The patient denies recent history of bleeding such as epistaxis, hematuria or hematochezia. She is asymptomatic from the anemia. We will observe for now.   There is element of macrocytosis. Prior vitamin B12 were within normal limits. I suspect she may have early fatty liver disease. Again this does not require further treatment and this can be observed.

## 2015-03-26 ENCOUNTER — Encounter: Payer: Self-pay | Admitting: *Deleted

## 2015-04-10 ENCOUNTER — Ambulatory Visit (HOSPITAL_BASED_OUTPATIENT_CLINIC_OR_DEPARTMENT_OTHER): Payer: Medicare Other | Admitting: Hematology and Oncology

## 2015-04-10 ENCOUNTER — Other Ambulatory Visit (HOSPITAL_BASED_OUTPATIENT_CLINIC_OR_DEPARTMENT_OTHER): Payer: Medicare Other

## 2015-04-10 ENCOUNTER — Encounter: Payer: Self-pay | Admitting: Hematology and Oncology

## 2015-04-10 VITALS — BP 115/75 | HR 80 | Temp 97.6°F | Resp 18 | Ht 66.0 in | Wt 212.1 lb

## 2015-04-10 DIAGNOSIS — N189 Chronic kidney disease, unspecified: Secondary | ICD-10-CM

## 2015-04-10 DIAGNOSIS — D631 Anemia in chronic kidney disease: Secondary | ICD-10-CM

## 2015-04-10 DIAGNOSIS — D696 Thrombocytopenia, unspecified: Secondary | ICD-10-CM

## 2015-04-10 DIAGNOSIS — Z720 Tobacco use: Secondary | ICD-10-CM

## 2015-04-10 DIAGNOSIS — Z853 Personal history of malignant neoplasm of breast: Secondary | ICD-10-CM

## 2015-04-10 DIAGNOSIS — N289 Disorder of kidney and ureter, unspecified: Secondary | ICD-10-CM

## 2015-04-10 DIAGNOSIS — F17200 Nicotine dependence, unspecified, uncomplicated: Secondary | ICD-10-CM

## 2015-04-10 LAB — COMPREHENSIVE METABOLIC PANEL (CC13)
ALBUMIN: 3.2 g/dL — AB (ref 3.5–5.0)
ALT: 11 U/L (ref 0–55)
ANION GAP: 6 meq/L (ref 3–11)
AST: 18 U/L (ref 5–34)
Alkaline Phosphatase: 87 U/L (ref 40–150)
BUN: 45 mg/dL — AB (ref 7.0–26.0)
CHLORIDE: 107 meq/L (ref 98–109)
CO2: 26 mEq/L (ref 22–29)
CREATININE: 2.3 mg/dL — AB (ref 0.6–1.1)
Calcium: 9.5 mg/dL (ref 8.4–10.4)
EGFR: 22 mL/min/{1.73_m2} — ABNORMAL LOW (ref >90)
Glucose: 107 mg/dl (ref 70–140)
POTASSIUM: 4.9 meq/L (ref 3.5–5.1)
Sodium: 139 mEq/L (ref 136–145)
Total Bilirubin: 0.23 mg/dL (ref 0.20–1.20)
Total Protein: 6.3 g/dL — ABNORMAL LOW (ref 6.4–8.3)

## 2015-04-10 LAB — CBC WITH DIFFERENTIAL/PLATELET
BASO%: 0.2 % (ref 0.0–2.0)
Basophils Absolute: 0 10*3/uL (ref 0.0–0.1)
EOS ABS: 0.2 10*3/uL (ref 0.0–0.5)
EOS%: 2.7 % (ref 0.0–7.0)
HEMATOCRIT: 34.4 % — AB (ref 34.8–46.6)
HGB: 11.6 g/dL (ref 11.6–15.9)
LYMPH%: 31 % (ref 14.0–49.7)
MCH: 33.2 pg (ref 25.1–34.0)
MCHC: 33.7 g/dL (ref 31.5–36.0)
MCV: 98.6 fL (ref 79.5–101.0)
MONO#: 0.5 10*3/uL (ref 0.1–0.9)
MONO%: 7 % (ref 0.0–14.0)
NEUT%: 59.1 % (ref 38.4–76.8)
NEUTROS ABS: 3.9 10*3/uL (ref 1.5–6.5)
PLATELETS: 144 10*3/uL — AB (ref 145–400)
RBC: 3.49 10*6/uL — AB (ref 3.70–5.45)
RDW: 13.8 % (ref 11.2–14.5)
WBC: 6.6 10*3/uL (ref 3.9–10.3)
lymph#: 2.1 10*3/uL (ref 0.9–3.3)

## 2015-04-10 LAB — MORPHOLOGY: PLT EST: ADEQUATE

## 2015-04-10 NOTE — Assessment & Plan Note (Signed)
Clinically, her breast exam is normal. According to the patient, her recent mammogram was negative. Continue observation only

## 2015-04-10 NOTE — Progress Notes (Signed)
[facility] OFFICE PROGRESS NOTE  Patient Care Team: Benito Mccreedy, MD as PCP - General (Internal Medicine) Heath Lark, MD as Consulting Physician (Hematology and Oncology)  SUMMARY OF ONCOLOGIC HISTORY:  [name]  was transferred to my care after her prior physician has left.  I reviewed the patient's records extensive and collaborated the history with the patient. Summary of her history is as follows: This patient was seen here for chronic thrombocytopenia. She also has remote history of breast cancer status post radiation treatment. She had chronic bipolar disorder and on multiple different medications. Medications were thought to be the cause of her chronic thrombocytopenia. The patient is not symptomatic. She is observed. INTERVAL HISTORY: Please see below for problem oriented charting. The patient denies any recent signs or symptoms of bleeding such as spontaneous epistaxis, hematuria or hematochezia. She denies any recent abnormal breast examination, palpable mass, abnormal breast appearance or nipple changes  REVIEW OF SYSTEMS:   Constitutional: Denies fevers, chills or abnormal weight loss Eyes: Denies blurriness of vision Ears, nose, mouth, throat, and face: Denies mucositis or sore throat Respiratory: Denies cough, dyspnea or wheezes Cardiovascular: Denies palpitation, chest discomfort or lower extremity swelling Gastrointestinal:  Denies nausea, heartburn or change in bowel habits Skin: Denies abnormal skin rashes Lymphatics: Denies new lymphadenopathy or easy bruising Neurological:Denies numbness, tingling or new weaknesses Behavioral/Psych: Mood is stable, no new changes  All other systems were reviewed with the patient and are negative.  I have reviewed the past medical history, past surgical history, social history and family history with the patient and they are unchanged from previous note.  ALLERGIES:  is allergic to doxycycline;  glipizide; ibuprofen; oxybutynin chloride; penicillins; sulfonamide derivatives; tetracycline; tolterodine tartrate; and chlorostat.  MEDICATIONS:  Current Outpatient Prescriptions  Medication Sig Dispense Refill  . atorvastatin (LIPITOR) 40 MG tablet Take 40 mg by mouth at bedtime.     . carvedilol (COREG) 3.125 MG tablet Take 3.125 mg by mouth every morning.     . Cholecalciferol (VITAMIN D3) 1000 UNITS CAPS Take 1,000 Units by mouth daily.     . divalproex (DEPAKOTE) 250 MG DR tablet Take 750 mg by mouth 2 (two) times daily.    . Fluticasone-Salmeterol (ADVAIR) 250-50 MCG/DOSE AEPB Inhale 1 puff into the lungs every 12 (twelve) hours.    . haloperidol (HALDOL) 5 MG tablet Take 2.5 mg by mouth 2 (two) times daily. 1 tab in am and 1/2 tab at hs    . levothyroxine (SYNTHROID, LEVOTHROID) 75 MCG tablet Take 75 mcg by mouth daily before breakfast.     . ramipril (ALTACE) 2.5 MG capsule Take 2.5 mg by mouth every morning.     . traZODone (DESYREL) 50 MG tablet Take 100 mg by mouth at bedtime.     No current facility-administered medications for this visit.    PHYSICAL EXAMINATION: ECOG PERFORMANCE STATUS: 0 - Asymptomatic  Filed Vitals:   04/10/15 1420  BP: 115/75  Pulse: 80  Temp: 97.6 F (36.4 C)  Resp: 18   Filed Weights   04/10/15 1420  Weight: 212 lb 1.6 oz (96.208 kg)    GENERAL:alert, no distress and comfortable. She is morbidly obese SKIN: skin color, texture, turgor are normal, no rashes or significant lesions EYES: normal, Conjunctiva are pink and non-injected, sclera clear OROPHARYNX:no exudate, no erythema and lips, buccal mucosa, and tongue normal  NECK: supple, thyroid normal size, non-tender, without nodularity LYMPH:  no palpable lymphadenopathy in the cervical, axillary or inguinal  LUNGS: clear to auscultation and percussion with normal breathing effort HEART: regular rate & rhythm and no murmurs and no lower extremity edema ABDOMEN:abdomen soft, non-tender and  normal bowel sounds Musculoskeletal:no cyanosis of digits and no clubbing  NEURO: alert & oriented x 3 with fluent speech, no focal motor/sensory deficits Breast examination shows well-healed lumpectomy scar with no other abnormalities LABORATORY DATA:  I have reviewed the data as listed    Component Value Date/Time   NA 134* 06/07/2013 0455   NA 141 05/07/2013 1415   K 4.0 06/07/2013 0455   K 4.0 05/07/2013 1415   CL 104 06/07/2013 0455   CL 106 05/04/2012 1454   CO2 18* 06/07/2013 0455   CO2 26 05/07/2013 1415   GLUCOSE 131* 06/07/2013 0455   GLUCOSE 81 05/07/2013 1415   GLUCOSE 97 05/04/2012 1454   BUN 40* 06/07/2013 0455   BUN 29.1* 05/07/2013 1415   CREATININE 1.84* 06/07/2013 0455   CREATININE 1.8* 05/07/2013 1415   CREATININE 1.96* 04/13/2013 1259   CALCIUM 9.0 06/07/2013 0455   CALCIUM 9.0 05/07/2013 1415   PROT 6.0* 05/07/2013 1415   PROT 6.3 07/19/2011 1537   ALBUMIN 2.7* 05/07/2013 1415   ALBUMIN 3.0* 07/19/2011 1537   AST 23 05/07/2013 1415   AST 15 07/19/2011 1537   ALT 18 05/07/2013 1415   ALT 11 07/19/2011 1537   ALKPHOS 85 05/07/2013 1415   ALKPHOS 86 07/19/2011 1537   BILITOT 0.24 05/07/2013 1415   BILITOT 0.1* 07/19/2011 1537   GFRNONAA 30* 06/07/2013 0455   GFRAA 35* 06/07/2013 0455    No results found for: SPEP, UPEP  Lab Results  Component Value Date   WBC 6.6 04/10/2015   NEUTROABS 3.9 04/10/2015   HGB 11.6 04/10/2015   HCT 34.4* 04/10/2015   MCV 98.6 04/10/2015   PLT 144* 04/10/2015      Chemistry      Component Value Date/Time   NA 134* 06/07/2013 0455   NA 141 05/07/2013 1415   K 4.0 06/07/2013 0455   K 4.0 05/07/2013 1415   CL 104 06/07/2013 0455   CL 106 05/04/2012 1454   CO2 18* 06/07/2013 0455   CO2 26 05/07/2013 1415   BUN 40* 06/07/2013 0455   BUN 29.1* 05/07/2013 1415   CREATININE 1.84* 06/07/2013 0455   CREATININE 1.8* 05/07/2013 1415   CREATININE 1.96* 04/13/2013 1259      Component Value Date/Time   CALCIUM 9.0  06/07/2013 0455   CALCIUM 9.0 05/07/2013 1415   ALKPHOS 85 05/07/2013 1415   ALKPHOS 86 07/19/2011 1537   AST 23 05/07/2013 1415   AST 15 07/19/2011 1537   ALT 18 05/07/2013 1415   ALT 11 07/19/2011 1537   BILITOT 0.24 05/07/2013 1415   BILITOT 0.1* 07/19/2011 1537      ASSESSMENT & PLAN:  History of breast cancer in female Clinically, her breast exam is normal. According to the patient, her recent mammogram was negative. Continue observation only  Thrombocytopenia This is chronic in nature. This is likely due to the medication. She is not symptomatic. She can be observed.   SMOKER I spent some time counseling the patient the importance of tobacco cessation. She is currently not interested to quit now.    No orders of the defined types were placed in this encounter.   All questions were answered. The patient knows to call the clinic with any problems, questions or concerns. No barriers to learning was detected. I spent 15 minutes counseling the  patient face to face. The total time spent in the appointment was 20 minutes and more than 50% was on counseling and review of test results     [facility], [place], MD 04/10/2015 2:55 PM

## 2015-04-10 NOTE — Assessment & Plan Note (Signed)
This is chronic in nature. This is likely due to the medication. She is not symptomatic. She can be observed.

## 2015-04-10 NOTE — Assessment & Plan Note (Signed)
I spent some time counseling the patient the importance of tobacco cessation. She is currently not interested to quit now. 

## 2015-04-25 ENCOUNTER — Encounter: Payer: Self-pay | Admitting: Cardiovascular Disease

## 2015-06-23 ENCOUNTER — Emergency Department (HOSPITAL_COMMUNITY)
Admission: EM | Admit: 2015-06-23 | Discharge: 2015-06-23 | Disposition: A | Discharge status: Home / Self Care | Payer: Medicare Other | Attending: Emergency Medicine | Admitting: Emergency Medicine

## 2015-06-23 ENCOUNTER — Encounter (HOSPITAL_COMMUNITY): Payer: Self-pay | Admitting: Emergency Medicine

## 2015-06-23 ENCOUNTER — Emergency Department (HOSPITAL_COMMUNITY): Payer: Medicare Other

## 2015-06-23 DIAGNOSIS — Z79899 Other long term (current) drug therapy: Secondary | ICD-10-CM | POA: Diagnosis not present

## 2015-06-23 DIAGNOSIS — Z880 Allergy status to penicillin: Secondary | ICD-10-CM | POA: Insufficient documentation

## 2015-06-23 DIAGNOSIS — Z720 Tobacco use: Secondary | ICD-10-CM | POA: Insufficient documentation

## 2015-06-23 DIAGNOSIS — J40 Bronchitis, not specified as acute or chronic: Secondary | ICD-10-CM

## 2015-06-23 DIAGNOSIS — Z862 Personal history of diseases of the blood and blood-forming organs and certain disorders involving the immune mechanism: Secondary | ICD-10-CM | POA: Diagnosis not present

## 2015-06-23 DIAGNOSIS — E785 Hyperlipidemia, unspecified: Secondary | ICD-10-CM | POA: Insufficient documentation

## 2015-06-23 DIAGNOSIS — E119 Type 2 diabetes mellitus without complications: Secondary | ICD-10-CM | POA: Insufficient documentation

## 2015-06-23 DIAGNOSIS — J441 Chronic obstructive pulmonary disease with (acute) exacerbation: Secondary | ICD-10-CM | POA: Insufficient documentation

## 2015-06-23 DIAGNOSIS — F319 Bipolar disorder, unspecified: Secondary | ICD-10-CM | POA: Insufficient documentation

## 2015-06-23 DIAGNOSIS — E039 Hypothyroidism, unspecified: Secondary | ICD-10-CM | POA: Diagnosis not present

## 2015-06-23 DIAGNOSIS — Z853 Personal history of malignant neoplasm of breast: Secondary | ICD-10-CM | POA: Insufficient documentation

## 2015-06-23 DIAGNOSIS — I10 Essential (primary) hypertension: Secondary | ICD-10-CM | POA: Diagnosis not present

## 2015-06-23 DIAGNOSIS — Z87448 Personal history of other diseases of urinary system: Secondary | ICD-10-CM | POA: Insufficient documentation

## 2015-06-23 DIAGNOSIS — Z7951 Long term (current) use of inhaled steroids: Secondary | ICD-10-CM | POA: Diagnosis not present

## 2015-06-23 DIAGNOSIS — R0600 Dyspnea, unspecified: Secondary | ICD-10-CM | POA: Diagnosis present

## 2015-06-23 MED ORDER — DEXAMETHASONE 4 MG PO TABS
ORAL_TABLET | ORAL | Status: AC
  Filled 2015-06-23: qty 3

## 2015-06-23 MED ORDER — IPRATROPIUM-ALBUTEROL 0.5-2.5 (3) MG/3ML IN SOLN
3.0000 mL | Freq: Once | RESPIRATORY_TRACT | Status: AC
  Administered 2015-06-23: 3 mL via RESPIRATORY_TRACT

## 2015-06-23 MED ORDER — DEXAMETHASONE 4 MG PO TABS: 12.0000 mg | ORAL_TABLET | Freq: Once | ORAL | Status: DC

## 2015-06-23 MED ORDER — BENZONATATE 100 MG PO CAPS: 100.0000 mg | ORAL_CAPSULE | Freq: Three times a day (TID) | ORAL | Status: DC

## 2015-06-23 MED ORDER — IPRATROPIUM-ALBUTEROL 0.5-2.5 (3) MG/3ML IN SOLN
RESPIRATORY_TRACT | Status: DC
Start: 2015-06-23 — End: 2015-06-23
  Filled 2015-06-23: qty 3

## 2015-06-23 NOTE — ED Notes (Signed)
Patient transported to X-ray 

## 2015-06-23 NOTE — ED Notes (Signed)
Patient with multiple complaints. States she is not able to breathe, needed fresh air, was nauseated. Patient states her problems are all getting worse.

## 2015-06-23 NOTE — ED Notes (Signed)
Bed: [facility] Expected date:  Expected time:  Means of arrival:  Comments: 62yr F Nausea and sore throat
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
## 2015-06-28 ENCOUNTER — Encounter (HOSPITAL_COMMUNITY): Payer: Self-pay | Admitting: Emergency Medicine

## 2015-06-28 ENCOUNTER — Encounter (HOSPITAL_COMMUNITY): Payer: Self-pay

## 2015-06-28 ENCOUNTER — Emergency Department (HOSPITAL_COMMUNITY)
Admission: EM | Admit: 2015-06-28 | Discharge: 2015-06-28 | Disposition: A | Discharge status: Other Acute Inpatient Hospital | Payer: Medicare Other | Attending: Physician Assistant | Admitting: Physician Assistant

## 2015-06-28 ENCOUNTER — Inpatient Hospital Stay (HOSPITAL_COMMUNITY)
Admission: AD | Admit: 2015-06-28 | Discharge: 2015-07-14 | DRG: 885 | Disposition: A | Discharge status: Home / Self Care | Payer: Medicare Other | Source: Intra-hospital | Attending: Psychiatry | Admitting: Psychiatry

## 2015-06-28 DIAGNOSIS — E039 Hypothyroidism, unspecified: Secondary | ICD-10-CM | POA: Diagnosis present

## 2015-06-28 DIAGNOSIS — F312 Bipolar disorder, current episode manic severe with psychotic features: Secondary | ICD-10-CM | POA: Diagnosis present

## 2015-06-28 DIAGNOSIS — E1122 Type 2 diabetes mellitus with diabetic chronic kidney disease: Secondary | ICD-10-CM | POA: Diagnosis present

## 2015-06-28 DIAGNOSIS — F29 Unspecified psychosis not due to a substance or known physiological condition: Secondary | ICD-10-CM | POA: Insufficient documentation

## 2015-06-28 DIAGNOSIS — Z853 Personal history of malignant neoplasm of breast: Secondary | ICD-10-CM | POA: Insufficient documentation

## 2015-06-28 DIAGNOSIS — I10 Essential (primary) hypertension: Secondary | ICD-10-CM | POA: Diagnosis not present

## 2015-06-28 DIAGNOSIS — F319 Bipolar disorder, unspecified: Secondary | ICD-10-CM | POA: Diagnosis not present

## 2015-06-28 DIAGNOSIS — Z79899 Other long term (current) drug therapy: Secondary | ICD-10-CM | POA: Diagnosis not present

## 2015-06-28 DIAGNOSIS — D649 Anemia, unspecified: Secondary | ICD-10-CM

## 2015-06-28 DIAGNOSIS — J449 Chronic obstructive pulmonary disease, unspecified: Secondary | ICD-10-CM | POA: Diagnosis present

## 2015-06-28 DIAGNOSIS — Z880 Allergy status to penicillin: Secondary | ICD-10-CM | POA: Diagnosis not present

## 2015-06-28 DIAGNOSIS — R7303 Prediabetes: Secondary | ICD-10-CM | POA: Clinically undetermined

## 2015-06-28 DIAGNOSIS — Z87448 Personal history of other diseases of urinary system: Secondary | ICD-10-CM | POA: Diagnosis not present

## 2015-06-28 DIAGNOSIS — F17210 Nicotine dependence, cigarettes, uncomplicated: Secondary | ICD-10-CM | POA: Diagnosis present

## 2015-06-28 DIAGNOSIS — E785 Hyperlipidemia, unspecified: Secondary | ICD-10-CM | POA: Diagnosis present

## 2015-06-28 DIAGNOSIS — I129 Hypertensive chronic kidney disease with stage 1 through stage 4 chronic kidney disease, or unspecified chronic kidney disease: Secondary | ICD-10-CM | POA: Diagnosis present

## 2015-06-28 DIAGNOSIS — Z7951 Long term (current) use of inhaled steroids: Secondary | ICD-10-CM | POA: Diagnosis not present

## 2015-06-28 DIAGNOSIS — D631 Anemia in chronic kidney disease: Secondary | ICD-10-CM | POA: Diagnosis present

## 2015-06-28 DIAGNOSIS — L298 Other pruritus: Secondary | ICD-10-CM | POA: Diagnosis not present

## 2015-06-28 DIAGNOSIS — Z833 Family history of diabetes mellitus: Secondary | ICD-10-CM

## 2015-06-28 DIAGNOSIS — N189 Chronic kidney disease, unspecified: Secondary | ICD-10-CM | POA: Diagnosis present

## 2015-06-28 DIAGNOSIS — L989 Disorder of the skin and subcutaneous tissue, unspecified: Secondary | ICD-10-CM

## 2015-06-28 DIAGNOSIS — Z862 Personal history of diseases of the blood and blood-forming organs and certain disorders involving the immune mechanism: Secondary | ICD-10-CM | POA: Insufficient documentation

## 2015-06-28 DIAGNOSIS — Z720 Tobacco use: Secondary | ICD-10-CM | POA: Insufficient documentation

## 2015-06-28 DIAGNOSIS — F22 Delusional disorders: Secondary | ICD-10-CM | POA: Diagnosis present

## 2015-06-28 DIAGNOSIS — D696 Thrombocytopenia, unspecified: Secondary | ICD-10-CM

## 2015-06-28 DIAGNOSIS — E119 Type 2 diabetes mellitus without complications: Secondary | ICD-10-CM | POA: Diagnosis not present

## 2015-06-28 LAB — RAPID URINE DRUG SCREEN, HOSP PERFORMED
AMPHETAMINES: NOT DETECTED
Barbiturates: NOT DETECTED
Benzodiazepines: NOT DETECTED
Cocaine: NOT DETECTED
OPIATES: NOT DETECTED
TETRAHYDROCANNABINOL: NOT DETECTED

## 2015-06-28 LAB — URINALYSIS, ROUTINE W REFLEX MICROSCOPIC
BILIRUBIN URINE: NEGATIVE
GLUCOSE, UA: NEGATIVE mg/dL
Ketones, ur: NEGATIVE mg/dL
Leukocytes, UA: NEGATIVE
Nitrite: NEGATIVE
PH: 6.5 (ref 5.0–8.0)
Protein, ur: 100 mg/dL — AB
SPECIFIC GRAVITY, URINE: 1.005 (ref 1.005–1.030)
UROBILINOGEN UA: 0.2 mg/dL (ref 0.0–1.0)

## 2015-06-28 LAB — VALPROIC ACID LEVEL: Valproic Acid Lvl: 28 ug/mL — ABNORMAL LOW (ref 50.0–100.0)

## 2015-06-28 LAB — CBC WITH DIFFERENTIAL/PLATELET
BASOS PCT: 0 %
Basophils Absolute: 0 10*3/uL (ref 0.0–0.1)
Eosinophils Absolute: 0.2 10*3/uL (ref 0.0–0.7)
Eosinophils Relative: 3 %
HEMATOCRIT: 35.5 % — AB (ref 36.0–46.0)
HEMOGLOBIN: 11.7 g/dL — AB (ref 12.0–15.0)
LYMPHS ABS: 2.1 10*3/uL (ref 0.7–4.0)
Lymphocytes Relative: 34 %
MCH: 33.5 pg (ref 26.0–34.0)
MCHC: 33 g/dL (ref 30.0–36.0)
MCV: 101.7 fL — ABNORMAL HIGH (ref 78.0–100.0)
MONOS PCT: 10 %
Monocytes Absolute: 0.6 10*3/uL (ref 0.1–1.0)
NEUTROS ABS: 3.3 10*3/uL (ref 1.7–7.7)
NEUTROS PCT: 53 %
Platelets: 175 10*3/uL (ref 150–400)
RBC: 3.49 MIL/uL — ABNORMAL LOW (ref 3.87–5.11)
RDW: 13.7 % (ref 11.5–15.5)
WBC: 6.2 10*3/uL (ref 4.0–10.5)

## 2015-06-28 LAB — URINE MICROSCOPIC-ADD ON

## 2015-06-28 LAB — ETHANOL: Alcohol, Ethyl (B): <5 mg/dL (ref <5)

## 2015-06-28 LAB — COMPREHENSIVE METABOLIC PANEL
ALT: 32 U/L (ref 14–54)
ANION GAP: 6 (ref 5–15)
AST: 48 U/L — ABNORMAL HIGH (ref 15–41)
Albumin: 3.2 g/dL — ABNORMAL LOW (ref 3.5–5.0)
Alkaline Phosphatase: 77 U/L (ref 38–126)
BUN: 37 mg/dL — ABNORMAL HIGH (ref 6–20)
CALCIUM: 9.2 mg/dL (ref 8.9–10.3)
CHLORIDE: 109 mmol/L (ref 101–111)
CO2: 28 mmol/L (ref 22–32)
Creatinine, Ser: 2.28 mg/dL — ABNORMAL HIGH (ref 0.44–1.00)
GFR calc non Af Amer: 23 mL/min — ABNORMAL LOW (ref >60)
GFR, EST AFRICAN AMERICAN: 26 mL/min — AB (ref >60)
Glucose, Bld: 103 mg/dL — ABNORMAL HIGH (ref 65–99)
POTASSIUM: 4.2 mmol/L (ref 3.5–5.1)
SODIUM: 143 mmol/L (ref 135–145)
Total Bilirubin: 0.5 mg/dL (ref 0.3–1.2)
Total Protein: 6.7 g/dL (ref 6.5–8.1)

## 2015-06-28 LAB — GLUCOSE, CAPILLARY: Glucose-Capillary: 125 mg/dL — ABNORMAL HIGH (ref 65–99)

## 2015-06-28 MED ORDER — MOMETASONE FURO-FORMOTEROL FUM 100-5 MCG/ACT IN AERO
2.0000 | INHALATION_SPRAY | Freq: Two times a day (BID) | RESPIRATORY_TRACT | Status: DC
  Filled 2015-06-28: qty 8.8

## 2015-06-28 MED ORDER — ONDANSETRON HCL 4 MG PO TABS: 4.0000 mg | ORAL_TABLET | Freq: Three times a day (TID) | ORAL | Status: DC | PRN

## 2015-06-28 MED ORDER — ACETAMINOPHEN 325 MG PO TABS: 650.0000 mg | ORAL_TABLET | ORAL | Status: DC | PRN

## 2015-06-28 MED ORDER — HYDROCOD POLST-CPM POLST ER 10-8 MG/5ML PO SUER
5.0000 mL | Freq: Two times a day (BID) | ORAL | Status: DC | PRN
Start: 2015-06-28 — End: 2015-06-28
  Administered 2015-06-28: 5 mL via ORAL
  Filled 2015-06-28 (×3): qty 5

## 2015-06-28 MED ORDER — ATORVASTATIN CALCIUM 40 MG PO TABS
40.0000 mg | ORAL_TABLET | Freq: Every day | ORAL | Status: DC
  Administered 2015-06-28 – 2015-07-13 (×16): 40 mg via ORAL
  Filled 2015-06-28 (×20): qty 1

## 2015-06-28 MED ORDER — ALUM & MAG HYDROXIDE-SIMETH 200-200-20 MG/5ML PO SUSP
30.0000 mL | ORAL | Status: DC | PRN
  Administered 2015-07-09: 30 mL via ORAL
  Filled 2015-06-28: qty 30

## 2015-06-28 MED ORDER — RAMIPRIL 2.5 MG PO CAPS
2.5000 mg | ORAL_CAPSULE | Freq: Every morning | ORAL | Status: DC
  Administered 2015-06-29 – 2015-07-14 (×15): 2.5 mg via ORAL
  Filled 2015-06-28 (×10): qty 1
  Filled 2015-06-28: qty 2
  Filled 2015-06-28 (×5): qty 1
  Filled 2015-06-28: qty 2
  Filled 2015-06-28 (×4): qty 1

## 2015-06-28 MED ORDER — DIVALPROEX SODIUM 250 MG PO DR TAB
250.0000 mg | DELAYED_RELEASE_TABLET | Freq: Two times a day (BID) | ORAL | Status: DC
  Administered 2015-06-28: 250 mg via ORAL
  Filled 2015-06-28: qty 1

## 2015-06-28 MED ORDER — ALUM & MAG HYDROXIDE-SIMETH 200-200-20 MG/5ML PO SUSP: 30.0000 mL | ORAL | Status: DC | PRN

## 2015-06-28 MED ORDER — RAMIPRIL 2.5 MG PO CAPS
2.5000 mg | ORAL_CAPSULE | Freq: Every morning | ORAL | Status: DC
  Administered 2015-06-28: 2.5 mg via ORAL
  Filled 2015-06-28: qty 1

## 2015-06-28 MED ORDER — MOMETASONE FURO-FORMOTEROL FUM 100-5 MCG/ACT IN AERO
2.0000 | INHALATION_SPRAY | Freq: Two times a day (BID) | RESPIRATORY_TRACT | Status: DC
  Administered 2015-06-29 – 2015-07-14 (×24): 2 via RESPIRATORY_TRACT
  Filled 2015-06-28 (×2): qty 8.8

## 2015-06-28 MED ORDER — NICOTINE 21 MG/24HR TD PT24
21.0000 mg | MEDICATED_PATCH | Freq: Every day | TRANSDERMAL | Status: DC
  Administered 2015-07-04: 21 mg via TRANSDERMAL
  Filled 2015-06-28 (×20): qty 1

## 2015-06-28 MED ORDER — ZOLPIDEM TARTRATE 5 MG PO TABS: 5.0000 mg | ORAL_TABLET | Freq: Every evening | ORAL | Status: DC | PRN

## 2015-06-28 MED ORDER — LORAZEPAM 1 MG PO TABS: 1.0000 mg | ORAL_TABLET | Freq: Three times a day (TID) | ORAL | Status: DC | PRN

## 2015-06-28 MED ORDER — CARVEDILOL 3.125 MG PO TABS
3.1250 mg | ORAL_TABLET | Freq: Every morning | ORAL | Status: DC
  Administered 2015-06-29 – 2015-07-14 (×15): 3.125 mg via ORAL
  Filled 2015-06-28 (×21): qty 1

## 2015-06-28 MED ORDER — HYDROCOD POLST-CPM POLST ER 10-8 MG/5ML PO SUER
5.0000 mL | Freq: Two times a day (BID) | ORAL | Status: DC | PRN
  Administered 2015-06-29 – 2015-07-08 (×7): 5 mL via ORAL
  Filled 2015-06-28 (×8): qty 5

## 2015-06-28 MED ORDER — ATORVASTATIN CALCIUM 40 MG PO TABS
40.0000 mg | ORAL_TABLET | Freq: Every day | ORAL | Status: DC
  Filled 2015-06-28: qty 1

## 2015-06-28 MED ORDER — LEVOTHYROXINE SODIUM 75 MCG PO TABS
75.0000 ug | ORAL_TABLET | Freq: Every day | ORAL | Status: DC
  Administered 2015-06-28: 75 ug via ORAL
  Filled 2015-06-28 (×2): qty 1

## 2015-06-28 MED ORDER — NICOTINE 21 MG/24HR TD PT24: 21.0000 mg | MEDICATED_PATCH | Freq: Every day | TRANSDERMAL | Status: DC

## 2015-06-28 MED ORDER — ACETAMINOPHEN 325 MG PO TABS
650.0000 mg | ORAL_TABLET | ORAL | Status: DC | PRN
  Administered 2015-06-29 – 2015-07-14 (×9): 650 mg via ORAL
  Filled 2015-06-28 (×9): qty 2

## 2015-06-28 MED ORDER — HALOPERIDOL 2 MG PO TABS
2.0000 mg | ORAL_TABLET | Freq: Two times a day (BID) | ORAL | Status: DC
  Administered 2015-06-28 – 2015-06-30 (×4): 2 mg via ORAL
  Filled 2015-06-28 (×9): qty 1

## 2015-06-28 MED ORDER — ALBUTEROL SULFATE HFA 108 (90 BASE) MCG/ACT IN AERS: 1.0000 | INHALATION_SPRAY | Freq: Four times a day (QID) | RESPIRATORY_TRACT | Status: DC | PRN

## 2015-06-28 MED ORDER — HALOPERIDOL 2 MG PO TABS: 2.0000 mg | ORAL_TABLET | Freq: Two times a day (BID) | ORAL | Status: DC

## 2015-06-28 MED ORDER — DIVALPROEX SODIUM 250 MG PO DR TAB
250.0000 mg | DELAYED_RELEASE_TABLET | Freq: Two times a day (BID) | ORAL | Status: DC
  Administered 2015-06-28 – 2015-06-29 (×2): 250 mg via ORAL
  Filled 2015-06-28 (×8): qty 1

## 2015-06-28 MED ORDER — ALBUTEROL SULFATE HFA 108 (90 BASE) MCG/ACT IN AERS
1.0000 | INHALATION_SPRAY | Freq: Four times a day (QID) | RESPIRATORY_TRACT | Status: DC | PRN
  Administered 2015-07-01: 2 via RESPIRATORY_TRACT
  Filled 2015-06-28: qty 6.7

## 2015-06-28 MED ORDER — CARVEDILOL 3.125 MG PO TABS
3.1250 mg | ORAL_TABLET | Freq: Every morning | ORAL | Status: DC
  Administered 2015-06-28: 3.125 mg via ORAL
  Filled 2015-06-28: qty 1

## 2015-06-28 MED ORDER — TRAZODONE HCL 50 MG PO TABS: 50.0000 mg | ORAL_TABLET | Freq: Every evening | ORAL | Status: DC | PRN

## 2015-06-28 MED ORDER — TRAZODONE HCL 50 MG PO TABS
50.0000 mg | ORAL_TABLET | Freq: Every evening | ORAL | Status: DC | PRN
  Administered 2015-06-28 – 2015-06-29 (×2): 50 mg via ORAL
  Filled 2015-06-28: qty 1
  Filled 2015-06-28: qty 2

## 2015-06-28 MED ORDER — LEVOTHYROXINE SODIUM 75 MCG PO TABS
75.0000 ug | ORAL_TABLET | Freq: Every day | ORAL | Status: DC
  Administered 2015-06-29 – 2015-07-14 (×16): 75 ug via ORAL
  Filled 2015-06-28 (×19): qty 1

## 2015-06-28 NOTE — ED Notes (Addendum)
[name] at [facility] contacted. [phone].  MT that takes care of the medications will not be in until 1100am.  She reports that the patient has been taking her medications, but has been not acting her normal for about the past 2 weeks.  She reports that last night she told them that she was pregnant by god and that her water broke.

## 2015-06-28 NOTE — Progress Notes (Signed)
Did not attend group 

## 2015-06-28 NOTE — ED Provider Notes (Signed)
CSN: [id number]     Arrival date & time 06/28/15  0248 History   First MD Initiated Contact with Patient 06/28/15 0252     Chief Complaint  Patient presents with  . Delusional     (Consider location/radiation/quality/duration/timing/severity/associated sxs/prior Treatment) HPI Comments: The patient arrives from a group home via EMS. She states she is here "because I'm about to have a baby, a big baby." She states the father is her husband who lives in a nursing home. She denies pain currently but states she feels her water has broken and she is about to deliver. No other history is available.   The history is provided by the patient. No language interpreter was used.    Past Medical History  Diagnosis Date  . Anemia 07/10/2011  . Thrombocytopenia ([place]) 07/10/2011  . HTN (hypertension)   . COPD (chronic obstructive pulmonary disease) ([place])   . Hyperlipidemia   . Hypothyroidism   . Bipolar affective ([place])   . DM (diabetes mellitus) ([place])   . Renal insufficiency   . Cancer ([place]) 2012    RIGHT BREAST, RADIATION DONE, NO CHEMO  . Hypertension    Past Surgical History  Procedure Laterality Date  . Ankle surgery Left 1989  . Tonsillectomy  AGE 68  . Breast surgery Right 2010    BREAST  . Ventral hernia repair N/A 06/06/2013    Procedure:  OPEN VENTRAL HERNIA REPAIR;  Surgeon: Imogene Burn. Georgette Dover, MD;  Location[facility];  Service: General;  Laterality: N/A;  . Insertion of mesh N/A 06/06/2013    Procedure: INSERTION OF MESH;  Surgeon: Imogene Burn. Georgette Dover, MD;  Location: [facility];  Service: General;  Laterality: N/A;   Family History  Problem Relation Age of Onset  . Diabetes Mother   . Cancer Father   . HIV Brother    Social History  Substance Use Topics  . Smoking status: Current Every Day Smoker -- 0.50 packs/day for 40 years    Types: Cigarettes, Cigars  . Smokeless tobacco: Never Used  . Alcohol Use: No   OB History    No data available     Review of Systems  Unable to  perform ROS: Psychiatric disorder  Constitutional: Negative for fever and chills.  HENT: Negative.   Respiratory: Negative.   Cardiovascular: Negative.   Gastrointestinal: Negative.   Musculoskeletal: Negative.   Skin: Negative.   Neurological: Negative.       Allergies  Doxycycline; Glipizide; Ibuprofen; Oxybutynin chloride; Penicillins; Sulfonamide derivatives; Tetracycline; Tolterodine tartrate; and Chlorostat  Home Medications   Prior to Admission medications   Medication Sig Start Date End Date Taking? Authorizing Provider  albuterol (PROVENTIL HFA;VENTOLIN HFA) 108 (90 BASE) MCG/ACT inhaler Inhale 1-2 puffs into the lungs every 6 (six) hours as needed for wheezing or shortness of breath.    Historical Provider, MD  atorvastatin (LIPITOR) 40 MG tablet Take 40 mg by mouth at bedtime.     Historical Provider, MD  benzonatate (TESSALON) 100 MG capsule Take 1 capsule (100 mg total) by mouth every 8 (eight) hours. 06/23/15   Virgel Manifold, MD  calcitRIOL (ROCALTROL) 0.25 MCG capsule Take 0.25 mcg by mouth every Monday, Wednesday, and Friday.    Historical Provider, MD  carvedilol (COREG) 3.125 MG tablet Take 3.125 mg by mouth every morning.     Historical Provider, MD  divalproex (DEPAKOTE) 250 MG DR tablet Take 250 mg by mouth 2 (two) times daily.     Historical Provider, MD  Fluticasone-Salmeterol (ADVAIR)  250-50 MCG/DOSE AEPB Inhale 1 puff into the lungs 2 (two) times daily.    Historical Provider, MD  haloperidol (HALDOL) 2 MG tablet Take 2 mg by mouth 2 (two) times daily. 04/17/15   Historical Provider, MD  levothyroxine (SYNTHROID, LEVOTHROID) 75 MCG tablet Take 75 mcg by mouth daily before breakfast.     Historical Provider, MD  Multiple Vitamin (MULTIVITAMIN WITH MINERALS) TABS tablet Take 1 tablet by mouth daily.    Historical Provider, MD  ramipril (ALTACE) 2.5 MG capsule Take 2.5 mg by mouth every morning.  04/13/12   Historical Provider, MD  traZODone (DESYREL) 50 MG tablet  Take 50-100 mg by mouth at bedtime as needed for sleep.    Historical Provider, MD   BP 139/76 mmHg  Pulse 84  Temp(Src) 98.5 F (36.9 C) (Oral)  Resp 20  SpO2 95% Physical Exam  Constitutional: She appears well-developed and well-nourished.  HENT:  Head: Normocephalic.  Edentulous.  Neck: Normal range of motion. Neck supple.  Cardiovascular: Normal rate and regular rhythm.   No murmur heard. Pulmonary/Chest: Effort normal and breath sounds normal. She has no wheezes. She has no rales.  Abdominal: Soft. Bowel sounds are normal. There is no tenderness. There is no rebound and no guarding.  Musculoskeletal: Normal range of motion.  Neurological: She is alert.  Skin: Skin is warm and dry. No rash noted.  Psychiatric: She has a normal mood and affect.    ED Course  Procedures (including critical care time) Labs Review Labs Reviewed  COMPREHENSIVE METABOLIC PANEL - Abnormal; Notable for the following:    Glucose, Bld 103 (*)    BUN 37 (*)    Creatinine, Ser 2.28 (*)    Albumin 3.2 (*)    AST 48 (*)    GFR calc non Af Amer 23 (*)    GFR calc Af Amer 26 (*)    All other components within normal limits  CBC WITH DIFFERENTIAL/PLATELET - Abnormal; Notable for the following:    RBC 3.49 (*)    Hemoglobin 11.7 (*)    HCT 35.5 (*)    MCV 101.7 (*)    All other components within normal limits  URINALYSIS, ROUTINE W REFLEX MICROSCOPIC (NOT AT [facility]) - Abnormal; Notable for the following:    Hgb urine dipstick SMALL (*)    Protein, ur 100 (*)    All other components within normal limits  ETHANOL  URINE RAPID DRUG SCREEN, HOSP PERFORMED  URINE MICROSCOPIC-ADD ON    Imaging Review No results found. I have personally reviewed and evaluated these images and lab results as part of my medical decision-making.   EKG Interpretation None      MDM   Final diagnoses:  None    1. Psychosis  The patient is evaluated by TTS and placement is being arranged.    Charlann Lange,  PA-C 06/28/15 [place], MD 06/28/15 [phone]

## 2015-06-28 NOTE — ED Notes (Signed)
Patient is on the phone 

## 2015-06-28 NOTE — ED Notes (Signed)
Meal given

## 2015-06-28 NOTE — Progress Notes (Signed)
3:02pm. Pt placed under IVC by [facility]. Per [employer], paperwork has been approved and law enforcement has been dispatched. CSW faxed and filed paperwork.  [place] Worker [facility] phone: [phone]

## 2015-06-28 NOTE — ED Notes (Signed)
Attempted to call patient's place of residence for home medication information.  There was no answer at this number.

## 2015-06-28 NOTE — ED Notes (Signed)
Brought in by [facility] from a group home (at [facility] with c/o having delusions.  Pt reported that pt was "gotten pregnant by God"; stated, "Jesus gave me a baby, a large baby--- 20 lbs.  I was having contractions this morning".   Has hx of Bipolar.

## 2015-06-28 NOTE — Progress Notes (Signed)
Admission note: Pt on unit after search and bring back completed by [facility] RN. Pt presents manic. 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 Pt is delusional. Loud, disruptive behavior on the milieu. Easily redirected. CBG collected d/t hx of DM. 125 mg/dl. d/t Unable to assess for majority of admission questions d/t pt unable to answer at this time d/t psychosis and refusing to answer at this time.  Pt reports she is not suppose to be here that she is suppose to be on a date. Denies visual hallucinations. Reports auditory hallucinations, reports God speaks to her. Pt reports she is rich. Pt reports she went to the ED because she thought she was pregnant by God. Pt denies SI/HI. Pt presents with poor personal hygiene, body odor, in scrubs. Speech pressured and rapid. Pt reports she takes her medication as prescribed and currently lives in the [place] home. Took prescribed medication with much encouragement.  Pt stayed back on unit to eat dinner tray. Special checks q 15 mins in place for safety. No roommate order for safety. Will continue to monitor.

## 2015-06-28 NOTE — ED Notes (Signed)
Bed: QQ59 Expected date: 06/28/15 Expected time:  Means of arrival: Ambulance Comments: Thinks she is impregnated by God
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
## 2015-06-28 NOTE — ED Notes (Signed)
Haldol is being held until patient's regular medication list is received from her place of residence.

## 2015-06-28 NOTE — ED Notes (Signed)
TTS consult in process at bedside at this time. 

## 2015-06-28 NOTE — ED Notes (Signed)
Attempted to contact patient's residence regarding her regular home medications.  There is no answer at the number provided at this time.

## 2015-06-28 NOTE — ED Notes (Signed)
Patient admitted [facility] delusional stating she is pregnant by god and delivered a 20lb baby.  Also states she is going to marry [employer].  Refuses to answer direct questions from this Probation officer."I do not trust you." I don't know you."

## 2015-06-28 NOTE — ED Notes (Signed)
Dr Parke Poisson and Manus Gunning NP into see

## 2015-06-28 NOTE — ED Notes (Signed)
On the phone 

## 2015-06-28 NOTE — ED Notes (Signed)
Up to the bathroom 

## 2015-06-28 NOTE — ED Notes (Addendum)
Patient declined to have bloodwork done before transfer.  Police are here to serve IVC papers and transfer patient to [facility].

## 2015-06-28 NOTE — ED Notes (Signed)
Patient was escorted off the unit by [employer] to be transferred to [facility].  Patient's belongings were given to [employer] upon her leaving the unit.  Patient remained safe at the time of transfer.
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
## 2015-06-28 NOTE — BH Assessment (Signed)
Assessment completed. Consulted Arlester Marker, NP who recommended inpatient treatment. TTS will seek placement. Informed Charlann Lange, PA-C of disposition.

## 2015-06-28 NOTE — Progress Notes (Addendum)
2:00pm. Pt has been accepted to [facility].   [place] Worker [facility] phone: [phone]

## 2015-06-28 NOTE — ED Notes (Signed)
Patient up using the phone.

## 2015-06-28 NOTE — ED Notes (Signed)
Ptup tot he desk talkative.  Pt sts that she has "degrees in nursing, accounting, and social work."  Pt sts that she is going to go to work at [employer] center in [place].

## 2015-06-29 DIAGNOSIS — F312 Bipolar disorder, current episode manic severe with psychotic features: Principal | ICD-10-CM

## 2015-06-29 LAB — GLUCOSE, CAPILLARY: GLUCOSE-CAPILLARY: 93 mg/dL (ref 65–99)

## 2015-06-29 MED ORDER — OLANZAPINE 5 MG PO TBDP
5.0000 mg | ORAL_TABLET | Freq: Two times a day (BID) | ORAL | Status: DC | PRN
  Administered 2015-06-30: 5 mg via ORAL
  Filled 2015-06-29 (×2): qty 1

## 2015-06-29 MED ORDER — DIVALPROEX SODIUM 500 MG PO DR TAB
500.0000 mg | DELAYED_RELEASE_TABLET | Freq: Two times a day (BID) | ORAL | Status: DC
  Administered 2015-06-29 – 2015-07-03 (×7): 500 mg via ORAL
  Filled 2015-06-29 (×13): qty 1

## 2015-06-29 MED ORDER — LORAZEPAM 1 MG PO TABS
1.0000 mg | ORAL_TABLET | Freq: Three times a day (TID) | ORAL | Status: DC | PRN
  Administered 2015-06-29 (×2): 1 mg via ORAL
  Filled 2015-06-29 (×3): qty 1

## 2015-06-29 MED ORDER — LORAZEPAM 2 MG/ML IJ SOLN: 1.0000 mg | Freq: Three times a day (TID) | INTRAMUSCULAR | Status: DC | PRN

## 2015-06-29 MED ORDER — OLANZAPINE 10 MG IM SOLR: 5.0000 mg | Freq: Two times a day (BID) | INTRAMUSCULAR | Status: DC | PRN

## 2015-06-29 MED ORDER — OLANZAPINE 5 MG PO TBDP
5.0000 mg | ORAL_TABLET | Freq: Two times a day (BID) | ORAL | Status: DC | PRN
  Administered 2015-06-29: 5 mg via ORAL
  Filled 2015-06-29 (×3): qty 1

## 2015-06-29 MED ORDER — LORAZEPAM 1 MG PO TABS: 2.0000 mg | ORAL_TABLET | Freq: Once | ORAL | Status: DC | PRN

## 2015-06-29 MED ORDER — DIPHENHYDRAMINE HCL 25 MG PO CAPS
50.0000 mg | ORAL_CAPSULE | Freq: Once | ORAL | Status: DC | PRN
  Administered 2015-06-29: 50 mg via ORAL
  Filled 2015-06-29: qty 2

## 2015-06-29 NOTE — Progress Notes (Signed)
D: Patient alert and oriented x 3. When trying to assess patient would go off on tangent, speech would not be clear. Patient refused 2000 medication but then took medication at 2136. Patient continued to say she was suing this Probation officer and fellow staff, she has a date tonight, she was pregnant with gods baby, you cant tell me what to do you are not my mother or father. Patient was redirected multiple times.  A: Staff to monitor Q 15 mins for safety. Encouragement and support offered. Scheduled medications administered per orders. R: Patient remains safe on the unit. Patient visible on the unit and interacting with peers. Patient taking administered medications.

## 2015-06-29 NOTE — Progress Notes (Signed)
Increased agitation, psychosis. Pt refusing to take PRN medication. FNP notified

## 2015-06-29 NOTE — Progress Notes (Signed)
Patient redirected multiple times during night. Patient awake and did not sleep. Patient states, "I am going to church in the morning."  Patient has three open areas on upper back/shoulders. Two areas her right side one area on left.  Area on left side is scabbed over. On right side patient has removed scabs but no drainage observed at this time. Will continue to monitor.

## 2015-06-29 NOTE — Progress Notes (Signed)
Unable to obtain EKG at this time d/t psychosis.

## 2015-06-29 NOTE — Progress Notes (Signed)
D:Pt presents manic. Labile. Disorganized thought content. Delusional, paranoid. Reports Pennie Banter is part of the [employer]. Pressured speech, rapid. Denies SI/HI. Pt responding to internal stimuli. Noted talking to self in the hallway. Pt attending groups on the unit. Increased irritability at times. Pt difficult to redirect.  A:Special checks q 15 mins in place for safety. Medication administered per MD order(see eMAR). PRN medication given for anxiety/irritability. Encouragement, support, redirected provided.  R:Safety maintained. Took medication with much encouragement. Will continue to monitor.
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
## 2015-06-29 NOTE — Progress Notes (Signed)
Did not attend group 

## 2015-06-29 NOTE — Progress Notes (Signed)
Undersign walked with pt to cafeteria. Pt ate lunch in the cafeteria, undersign present. Tolerated well.

## 2015-06-29 NOTE — BHH Group Notes (Signed)
[place] Group Notes:  (Clinical Social Work)  06/29/2015  [place] Group Notes:  (Clinical Social Work)  06/29/2015  11:00AM-12:00PM  Summary of Progress/Problems:  The main focus of today's process group was to listen to a variety of genres of music and to identify that different types of music provoke different responses.   The patient expressed was labile throughout group, sang very loudly and danced a lot, then would become angry and yell, fell asleep at one point.  Type of Therapy:  Music Therapy   Participation Level:  Active  Participation Quality:   Intrusive  Affect:  Blunted and Labile  Cognitive:  Disorganized  Insight:  Poor  Engagement in Therapy: Limited  Modes of Intervention:   Activity, Exploration  Selmer Dominion, LCSW 06/29/2015

## 2015-06-30 DIAGNOSIS — F312 Bipolar disorder, current episode manic severe with psychotic features: Secondary | ICD-10-CM | POA: Diagnosis present

## 2015-06-30 LAB — GLUCOSE, CAPILLARY
Glucose-Capillary: 97 mg/dL (ref 65–99)
Glucose-Capillary: 168 mg/dL — ABNORMAL HIGH (ref 65–99)
Glucose-Capillary: 147 mg/dL — ABNORMAL HIGH (ref 65–99)

## 2015-06-30 MED ORDER — DIPHENHYDRAMINE HCL 50 MG/ML IJ SOLN: 50.0000 mg | Freq: Four times a day (QID) | INTRAMUSCULAR | Status: DC | PRN

## 2015-06-30 MED ORDER — ARIPIPRAZOLE 5 MG PO TABS
5.0000 mg | ORAL_TABLET | Freq: Every day | ORAL | Status: DC
  Filled 2015-06-30 (×4): qty 1

## 2015-06-30 MED ORDER — HALOPERIDOL 5 MG PO TABS
5.0000 mg | ORAL_TABLET | Freq: Four times a day (QID) | ORAL | Status: DC | PRN
  Administered 2015-07-01 – 2015-07-10 (×5): 5 mg via ORAL
  Filled 2015-06-30 (×5): qty 1

## 2015-06-30 MED ORDER — BACITRACIN-NEOMYCIN-POLYMYXIN OINTMENT TUBE
TOPICAL_OINTMENT | Freq: Two times a day (BID) | CUTANEOUS | Status: DC
  Administered 2015-07-01 – 2015-07-14 (×25): via TOPICAL
  Filled 2015-06-30 (×2): qty 15

## 2015-06-30 MED ORDER — TRAZODONE HCL 100 MG PO TABS
100.0000 mg | ORAL_TABLET | Freq: Every day | ORAL | Status: DC
  Administered 2015-06-30: 100 mg via ORAL
  Filled 2015-06-30 (×2): qty 1

## 2015-06-30 MED ORDER — LORAZEPAM 1 MG PO TABS
1.0000 mg | ORAL_TABLET | Freq: Three times a day (TID) | ORAL | Status: DC | PRN
  Administered 2015-06-30 – 2015-07-14 (×9): 1 mg via ORAL
  Filled 2015-06-30 (×13): qty 1

## 2015-06-30 MED ORDER — LORAZEPAM 2 MG/ML IJ SOLN: 1.0000 mg | Freq: Three times a day (TID) | INTRAMUSCULAR | Status: DC | PRN

## 2015-06-30 MED ORDER — INSULIN ASPART 100 UNIT/ML ~~LOC~~ SOLN: 0.0000 [IU] | Freq: Three times a day (TID) | SUBCUTANEOUS | Status: DC

## 2015-06-30 MED ORDER — ARIPIPRAZOLE 5 MG PO TABS
5.0000 mg | ORAL_TABLET | ORAL | Status: DC
  Administered 2015-06-30 – 2015-07-01 (×2): 5 mg via ORAL
  Filled 2015-06-30 (×4): qty 1

## 2015-06-30 MED ORDER — DIPHENHYDRAMINE HCL 25 MG PO CAPS: 50.0000 mg | ORAL_CAPSULE | Freq: Four times a day (QID) | ORAL | Status: DC | PRN

## 2015-06-30 MED ORDER — HALOPERIDOL LACTATE 5 MG/ML IJ SOLN: 5.0000 mg | Freq: Four times a day (QID) | INTRAMUSCULAR | Status: DC | PRN

## 2015-06-30 NOTE — Progress Notes (Signed)
Patient ID: [name], female   DOB: 21-Apr-1961, 54 y.o.   MRN: [id number] D  ---   Pt.  Refuses to take medications at 1000 hrs.  She is paranoid and delusional saying we are trying to dope her up and keep her here.  She said her body guards are coming to take her out  Of hospital.   Pt. Thinks staff is attempting to do her harm She rambles  Incoherently about various unrelated different topics  .   Pt. Stay away from her and have no interaction .    Staff is attempting to keep pt. Calm and reduce her stress as much as possible.  Pt. Does not understand concept of contracting for safe,  But remain safe under watchful eyes of staff.  --- A  --  Encourage meds and keep pt. Safe.  --  R ---  Pt. Remain safe on unit

## 2015-06-30 NOTE — Progress Notes (Signed)
Adult Psychoeducational Group Note  Date:  06/30/2015 Time:  8:30 PM  Group Topic/Focus:  Wrap-Up Group:   The focus of this group is to help patients review their daily goal of treatment and discuss progress on daily workbooks.  Participation Level:  Active  Participation Quality:  Intrusive and Monopolizing  Affect:  Not Congruent  Cognitive:  Disorganized  Insight: Limited  Engagement in Group:  Distracting, Monopolizing and Off Topic  Modes of Intervention:  Discussion  Additional Comments:  Pt stated that her day "started off bad, but then it turned good". Pt's speech was disorganized and pt constantly had to be reminded to stay on topic. Pt was also constantly interrupting other patients during their time to share. Pt was redirected.   Lincoln Brigham 06/30/2015, 10:05 PM

## 2015-06-30 NOTE — BHH Counselor (Signed)
CSW attempted to meet with Pt to complete PSA. Pt is unable to participate at this time due to rapid, pressured speech, tangential and delusional thought process. Pt endorsing that she came here to "check this place out" because she was considering working here. 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  Peri Maris, [place] Work [phone]

## 2015-06-30 NOTE — BHH Group Notes (Signed)
[place] Group Notes:  (Counselor/Nursing/MHT/Case Management/Adjunct)  06/30/2015 1:15PM  Type of Therapy:  Group Therapy  Participation Level:  Active  Participation Quality:  Appropriate  Affect:  Flat  Cognitive:  Oriented  Insight:  Improving  Engagement in Group:  Limited  Engagement in Therapy:  Limited  Modes of Intervention:  Discussion, Exploration and Socialization  Summary of Progress/Problems: The topic for group was balance in life.  Pt participated in the discussion about when their life was in balance and out of balance and how this feels.  Pt discussed ways to get back in balance and short term goals they can work on to get where they want to be. Intrusive, disorganized, grandiose, unable to read social cues.  Allowed her to stay for 10 minutes.  She was not able to follow directions.  Excused from group.  Got her out with help from mental health tech staff.   [name] 06/30/2015 3:29 PM

## 2015-06-30 NOTE — Progress Notes (Signed)
Patient ID: SIDNEE GAMBRILL, female   DOB: [birth year], 54 y.o.   MRN: [id number] D   ---  Pt. Has become hostile  And verbally aggressive to staff and peers.  She irritates people in dayroom and curses at peers who are on the phone.  Pt. Calls staff "bitches", etc.  She has refused her medications and will not take anything  PO.  She is restless and walks the hall and is constantly at the Nurses station talking about rambling topics.  Pt. refers to her  " body guards coming to take me out of here".  And the " FBI being out to get me".  Pt. Also continues to say staff is trying to hurt her and keep her here against her will.  --- A ---  Monitor pt. For safety  -- R --  Pt. Remain safe but dis-oriented and hostile on unit

## 2015-06-30 NOTE — Progress Notes (Signed)
Patient ID: [name], female   DOB: 1961/01/22, 54 y.o.   MRN: [id number] D   Pt. Refused 1700 hr. Medications and also refused  Novolog  Which would hav been 3 units .  --- A --  Offer and encourage medications  --- R ---  Pt continues to refuse all meds at this time.

## 2015-06-30 NOTE — Plan of Care (Signed)
Problem: Alteration in thought process Goal: STG-Patient does not respond to command hallucinations Outcome: Not Progressing Pt has disorganized thought and appears to be responding to internal stimuli.

## 2015-06-30 NOTE — Progress Notes (Signed)
Patient ID: [name], female   DOB: 02/27/1961, 54 y.o.   MRN: [id number] D: Patient labile, delusional and having disorganized thoughts precess. Pt appeared to be responding to internal stimuli. Cursing at staff and calling peers by name. Pt has pressured speech. Pt denies suicidal /homicidal ideation intent and plan. Pt attended evening wrap up group. No acute physical distress noted.   A: Met with pt 1:1. Medications administered as prescribed. Writer encouraged pt to discuss feelings. Support and encouraged provided.   R: Patient is safe on the unit and compliant with medication.

## 2015-06-30 NOTE — Tx Team (Signed)
Interdisciplinary Treatment Plan Update (Adult) Date: 06/30/2015   Date: 06/30/2015 1:18 PM  Progress in Treatment:  Attending groups: No Participating in groups: No Taking medication as prescribed: No, Pt is not taking medications consistently Tolerating medication: Yes  Family/Significant othe contact made: No, CSW assessing for appropriate contact Patient understands diagnosis: No Discussing patient identified problems/goals with staff: Yes  Medical problems stabilized or resolved: Yes  Denies suicidal/homicidal ideation: Yes Patient has not harmed self or Others: Yes   New problem(s) identified: None identified at this time.   Discharge Plan or Barriers: Per Pt, she is from [facility]; will return there at DC. Outpatient services unknown  Additional comments: Meds started: Abilify 44m, Depakote 5023m  Reason for Continuation of Hospitalization:  Medication stabilization Mania Psychosis  Estimated length of stay: 3-5 days  Review of initial/current patient goals per problem list:   1.  Goal(s): Patient will participate in aftercare plan  Met:  No  Target date: 3-5 days from date of admission   As evidenced by: Patient will participate within aftercare plan AEB aftercare provider and housing plan at discharge being identified.   [facility] 5.  Goal(s): Patient will demonstrate decreased signs of psychosis  . Met:  No . Target date: 3-5 days from date of admission  . As evidenced by: Patient will demonstrate decreased frequency of AVH or return to baseline function    06/30/15: Pt continues to be actively psychotic; observed to be responding to internal stimuli,    delusional thought content 6. Goal (s):  Patient will demonstrate decreased signs of mania . Met:  No . Target date: 3-5 days from date of admission  . As evidenced by:  Patient demonstrate decreased signs of mania AEB  decreased mood instability and demonstration of stable mood    06/30/15: Pt continues to exhibit pressured speech, delusional and disorganized thought     process, with labile mood.  Attendees:  Patient:    Family:    Physician: Dr. CoParke PoissonMD  06/30/2015 1:18 PM  Nursing: JeLars PinksRN Case manager  06/30/2015 1:18 PM  Clinical Social Worker LaNorman ClayMSW 06/30/2015 1:18 PM  Other: VaLucinda DellMoBeverly Sessionsiasion 06/30/2015 1:18 PM  Clinical:  JaCherylann BanasiattRN 06/30/2015 1:18 PM  Other: , RN Charge Nurse 06/30/2015 1:18 PM  Other:     LaPeri MarisLCLatanya PresserSW

## 2015-06-30 NOTE — Progress Notes (Signed)
Patient lacks capacity to participate in disposition planning.  Jenefer Woerner ,MD Attending Psychiatrist  [facility]  

## 2015-06-30 NOTE — Progress Notes (Signed)
[facility] MD Progress Note  06/30/2015 2:46 PM [name]  MRN:  [id number] Subjective: Patient is a 54 y old CF who has a hx of Bipolar disorder , as well as multiple medical comorbidities , who presented to [facility] by EMS , for nausea after smoking a cigarette. Patient per initial notes in EHR appeared to be disorganized , delusional in the ED.  Patient seen and chart reviewed.Discussed patient with treatment team. Pt continues to be manic, pressured , disorganized , loose , tangential thought process.Its difficult to follow patient's speech. Pt continues to be very irritable with labile moods and needs constant redirection. Pt reports she is willing to try Abilify . Hence will start on her on the same.  Pt tolerating her Depakote well. Per staff - pt continues to need a lot of encouragement to take her medications.    Principal Problem: Bipolar disorder, current episode manic severe with psychotic features [facility]) Diagnosis:   Patient Active Problem List   Diagnosis Date Noted  . Bipolar disorder, current episode manic severe with psychotic features ([place]) [F31.2] 06/30/2015  . History of breast cancer in female [Z85.3] 04/10/2015  . Irreducible ventral hernia [K46.0] 04/12/2013  . HTN (hypertension) [I10]   . COPD (chronic obstructive pulmonary disease) ([place]) [J44.9]   . DM (diabetes mellitus) ([place]) [E11.9]   . Chronic kidney disease [N18.9]   . Hyperlipidemia [E78.5]   . Hypothyroidism [E03.9]   . Bipolar affective ([place]) [F31.9]   . Anemia in chronic kidney disease(285.21) [N03.9, D63.1] 07/10/2011  . Thrombocytopenia ([place]) [D69.6] 07/10/2011  . DIABETES MELLITUS, TYPE II [E11.9] 08/15/2006  . BIPOLAR AFFECTIVE DISORDER [F31.9] 08/15/2006  . SMOKER [F17.200] 08/15/2006  . COPD [J44.9] 08/15/2006  . BOILS, RECURRENT [L02.92, L02.93] 08/15/2006  . ACNE ROSACEA [L71.9] 08/15/2006  . ACNE NEC [L70.8] 08/15/2006  . EDEMA [R60.9] 08/15/2006  . INCONTINENCE, URGE [N39.41] 08/15/2006    Total Time spent with patient: 30 minutes  Past Psychiatric History: Pt with hx of Bipolar disorder , several hospitalizations in the past. Pt unable to provide further details . Will work on obtaining collateral information.   Past Medical History:  Past Medical History  Diagnosis Date  . Anemia 07/10/2011  . Thrombocytopenia ([place]) 07/10/2011  . HTN (hypertension)   . COPD (chronic obstructive pulmonary disease) ([place])   . Hyperlipidemia   . Hypothyroidism   . Bipolar affective ([place])   . DM (diabetes mellitus) ([place])   . Renal insufficiency   . 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 Hypertension     Past Surgical History  Procedure Laterality Date  . Ankle surgery Left 1989  . Tonsillectomy  AGE 53  . Breast surgery Right 2010    BREAST  . Ventral hernia repair N/A 06/06/2013    Procedure:  OPEN VENTRAL HERNIA REPAIR;  Surgeon: Imogene Burn. Georgette Dover, MD;  Location: [facility] ORS;  Service: General;  Laterality: N/A;  . Insertion of mesh N/A 06/06/2013    Procedure: INSERTION OF MESH;  Surgeon: Imogene Burn. Georgette Dover, MD;  Location: [facility] ORS;  Service: General;  Laterality: N/A;   Family History:  Family History  Problem Relation Age of Onset  . Diabetes Mother   . Cancer Father   . HIV Brother    Family Psychiatric  History: Pt unable to provide details about family hx . Social History: Pt reports she is divorced and lives with a room mate .  History  Alcohol Use No  History  Drug Use No    Social History   Social History  . Marital Status: Divorced    Spouse Name: N/A  . Number of Children: N/A  . Years of Education: N/A   Social History Main Topics  . Smoking status: Current Every Day Smoker -- 0.50 packs/day for 40 years    Types: Cigarettes, Cigars  . Smokeless tobacco: Never Used  . Alcohol Use: No  . Drug Use: No  . Sexual Activity: Not Asked   Other Topics Concern  . None   Social History Narrative   Additional Social History:     History of alcohol / drug use?: No history of alcohol / drug abuse (unable to assess)                    Sleep: Poor  Appetite:  Fair  Current Medications: Current Facility-Administered Medications  Medication Dose Route Frequency Provider Last Rate Last Dose  . acetaminophen (TYLENOL) tablet 650 mg  650 mg Oral Q4H PRN Lurena Nida, NP   650 mg at 06/29/15 0133  . albuterol (PROVENTIL HFA;VENTOLIN HFA) 108 (90 BASE) MCG/ACT inhaler 1-2 puff  1-2 puff Inhalation Q6H PRN Lurena Nida, NP      . alum & mag hydroxide-simeth (MAALOX/MYLANTA) 200-200-20 MG/5ML suspension 30 mL  30 mL Oral PRN Lurena Nida, NP      . ARIPiprazole (ABILIFY) tablet 5 mg  5 mg Oral BH-qamhs Rainn Bullinger, MD      . atorvastatin (LIPITOR) tablet 40 mg  40 mg Oral QHS Lurena Nida, NP   40 mg at 06/29/15 2059  . carvedilol (COREG) tablet 3.125 mg  3.125 mg Oral q morning - 10a Lurena Nida, NP   3.125 mg at 06/29/15 0754  . chlorpheniramine-HYDROcodone (TUSSIONEX) 10-8 MG/5ML suspension 5 mL  5 mL Oral Q12H PRN Lurena Nida, NP   5 mL at 06/29/15 2250  . diphenhydrAMINE (BENADRYL) capsule 50 mg  50 mg Oral Q6H PRN Ursula Alert, MD       Or  . diphenhydrAMINE (BENADRYL) injection 50 mg  50 mg Intramuscular Q6H PRN Castulo Scarpelli, MD      . divalproex (DEPAKOTE) DR tablet 500 mg  500 mg Oral BID Benjamine Mola, FNP   500 mg at 06/30/15 6962  . haloperidol (HALDOL) tablet 5 mg  5 mg Oral Q6H PRN Ursula Alert, MD       Or  . haloperidol lactate (HALDOL) injection 5 mg  5 mg Intramuscular Q6H PRN Narada Uzzle, MD      . insulin aspart (novoLOG) injection 0-15 Units  0-15 Units Subcutaneous TID WC Ursula Alert, MD   0 Units at 06/30/15 1149  . levothyroxine (SYNTHROID, LEVOTHROID) tablet 75 mcg  75 mcg Oral QAC breakfast Lurena Nida, NP   75 mcg at 06/30/15 9528  . LORazepam (ATIVAN) tablet 1 mg  1 mg Oral Q8H PRN Ursula Alert, MD       Or  . LORazepam (ATIVAN) injection 1 mg  1 mg  Intramuscular Q8H PRN Ursula Alert, MD      . mometasone-formoterol (DULERA) 100-5 MCG/ACT inhaler 2 puff  2 puff Inhalation BID Lurena Nida, NP   2 puff at 06/30/15 0800  . neomycin-bacitracin-polymyxin (NEOSPORIN) ointment   Topical BID Seeley Hissong, MD      . nicotine (NICODERM CQ - dosed in mg/24 hours) patch 21 mg  21 mg Transdermal Daily Benjamine Mola,  FNP   21 mg at 06/29/15 0755  . ramipril (ALTACE) capsule 2.5 mg  2.5 mg Oral q morning - 10a Lurena Nida, NP   2.5 mg at 06/29/15 0753  . traZODone (DESYREL) tablet 100 mg  100 mg Oral QHS Ursula Alert, MD        Lab Results:  Results for orders placed or performed during the hospital encounter of 06/28/15 (from the past 48 hour(s))  Glucose, capillary     Status: Abnormal   Collection Time: 06/28/15  5:41 PM  Result Value Ref Range   Glucose-Capillary 125 (H) 65 - 99 mg/dL  Valproic acid level     Status: Abnormal   Collection Time: 06/28/15  6:50 PM  Result Value Ref Range   Valproic Acid Lvl 28 (L) 50.0 - 100.0 ug/mL    Comment: Performed at [facility]  Glucose, capillary     Status: None   Collection Time: 06/29/15  8:03 PM  Result Value Ref Range   Glucose-Capillary 93 65 - 99 mg/dL  Glucose, capillary     Status: None   Collection Time: 06/30/15 11:42 AM  Result Value Ref Range   Glucose-Capillary 97 65 - 99 mg/dL   Comment 1 Notify RN    Comment 2 Document in Chart     Physical Findings: AIMS: Facial and Oral Movements Muscles of Facial Expression: None, normal Lips and Perioral Area: None, normal Jaw: None, normal Tongue: None, normal,Extremity Movements Upper (arms, wrists, hands, fingers): None, normal Lower (legs, knees, ankles, toes): None, normal, Trunk Movements Neck, shoulders, hips: None, normal, Overall Severity Severity of abnormal movements (highest score from questions above): None, normal Incapacitation due to abnormal movements: None, normal Patient's awareness of  abnormal movements (rate only patient's report): No Awareness, Dental Status Current problems with teeth and/or dentures?: Yes Does patient usually wear dentures?: No  CIWA:    COWS:     Musculoskeletal: Strength & Muscle Tone: within normal limits Gait & Station: normal Patient leans: N/A  Psychiatric Specialty Exam: Review of Systems  Unable to perform ROS: mental acuity    Blood pressure 116/79, pulse 102, temperature 97.8 F (36.6 C), temperature source Oral, resp. rate 20, height 5\' 5"  (1.651 m), weight 94.348 kg (208 lb), SpO2 95 %.Body mass index is 34.61 kg/(m^2).  General Appearance: Disheveled  Eye Contact::  Good  Speech:  Pressured  Volume:  Increased  Mood:  Euphoric  Affect:  Labile  Thought Process:  Disorganized, Irrelevant, Loose and Tangential  Orientation:  Other:  to person  Thought Content:  Delusions, Paranoid Ideation and Rumination  Suicidal Thoughts:  No  Homicidal Thoughts:  No  Memory:  Immediate;   Fair Recent;   Poor Remote;   Poor  Judgement:  Impaired  Insight:  Shallow  Psychomotor Activity:  Increased and Restlessness  Concentration:  Poor  Recall:  [place]  Language: Fair  Akathisia:  No  Handed:  Right  AIMS (if indicated):     Assets:  Others:  access to health care  ADL's:  Impaired  Cognition: Impaired,  Mild  Sleep:  Number of Hours: 1.25   Treatment Plan Summary: Daily contact with patient to assess and evaluate symptoms and progress in treatment and Medication management  Will continue Depakote DR 500 mg po bid for mood lability. Depakote level in 5 days. Will add Abilify 5 mg po bid for psychosis. Pt prefers to be on Abilify. Has tried Haldol, risperidone  in the past. Will add Trazodone 50 mg po qhs for sleep. Add Haldol 5 mg po /IM prn , Benadryl 50 mg PO/IM prn ,Ativan 1 mg PO/IM prn for severe anxiety/agitation. Restart home medications where indicated. Reviewed past medical records,treatment plan.   Will continue to monitor vitals ,medication compliance and treatment side effects while patient is here.  Will monitor for medical issues as well as call consult as needed.  Reviewed valproic acid level - low labs ,will order TSH, lipid panel, PL Hba1c , ekg due to being on neuroleptics. CSW will start working on disposition. CSW to obtain collateral information. Patient to participate in therapeutic milieu .       Mickala Laton MD 06/30/2015, 2:46 PM

## 2015-06-30 NOTE — Progress Notes (Signed)
Patient ID: [name], female   DOB: 04/04/1961, 54 y.o.   MRN: [id number] PER STATE REGULATIONS 482.30  THIS CHART WAS REVIEWED FOR MEDICAL NECESSITY WITH RESPECT TO THE PATIENT'S ADMISSION/DURATION OF STAY.  NEXT REVIEW DATE:07/02/15  Roma Schanz, RN, BSN CASE MANAGER

## 2015-06-30 NOTE — Progress Notes (Signed)
Patient ID: [name], female   DOB: Mar 16, 1961, 54 y.o.   MRN: [id number]  D  --  Pt refused ordered EKG and all medications.  She is delusional and paranoid  Thinking staff is trying to harm her.    Pt.  Angrily resists attempts from staff  To complete orders , etc. --- A ---   Encourage pt. --- R ---  Pt. Un-cooperative

## 2015-07-01 ENCOUNTER — Other Ambulatory Visit: Payer: Self-pay

## 2015-07-01 LAB — GLUCOSE, CAPILLARY
GLUCOSE-CAPILLARY: 137 mg/dL — AB (ref 65–99)
GLUCOSE-CAPILLARY: 143 mg/dL — AB (ref 65–99)
Glucose-Capillary: 130 mg/dL — ABNORMAL HIGH (ref 65–99)

## 2015-07-01 LAB — TSH: TSH: 2.753 u[IU]/mL (ref 0.350–4.500)

## 2015-07-01 LAB — LIPID PANEL
CHOL/HDL RATIO: 2.8 ratio
Cholesterol: 148 mg/dL (ref 0–200)
HDL: 52 mg/dL (ref >40)
LDL CALC: 81 mg/dL (ref 0–99)
Triglycerides: 74 mg/dL (ref <150)
VLDL: 15 mg/dL (ref 0–40)

## 2015-07-01 MED ORDER — GABAPENTIN 100 MG PO CAPS
100.0000 mg | ORAL_CAPSULE | ORAL | Status: DC
  Administered 2015-07-02: 100 mg via ORAL
  Filled 2015-07-01 (×9): qty 1

## 2015-07-01 MED ORDER — TRAZODONE HCL 150 MG PO TABS
150.0000 mg | ORAL_TABLET | Freq: Every day | ORAL | Status: DC
  Administered 2015-07-01: 50 mg via ORAL
  Filled 2015-07-01 (×3): qty 1

## 2015-07-01 MED ORDER — ARIPIPRAZOLE 10 MG PO TABS
10.0000 mg | ORAL_TABLET | Freq: Every day | ORAL | Status: DC
  Administered 2015-07-01 – 2015-07-02 (×2): 10 mg via ORAL
  Filled 2015-07-01 (×4): qty 1

## 2015-07-01 MED ORDER — BENZTROPINE MESYLATE 0.5 MG PO TABS
0.5000 mg | ORAL_TABLET | Freq: Every day | ORAL | Status: DC
  Administered 2015-07-01 – 2015-07-08 (×8): 0.5 mg via ORAL
  Filled 2015-07-01 (×12): qty 1

## 2015-07-01 MED ORDER — ARIPIPRAZOLE 5 MG PO TABS
5.0000 mg | ORAL_TABLET | Freq: Every day | ORAL | Status: DC
  Administered 2015-07-02: 5 mg via ORAL
  Filled 2015-07-01 (×3): qty 1

## 2015-07-01 MED ORDER — ADULT MULTIVITAMIN W/MINERALS CH
1.0000 | ORAL_TABLET | Freq: Every day | ORAL | Status: DC
  Administered 2015-07-01 – 2015-07-14 (×14): 1 via ORAL
  Filled 2015-07-01 (×18): qty 1

## 2015-07-01 MED ORDER — GLIPIZIDE ER 5 MG PO TB24
5.0000 mg | ORAL_TABLET | Freq: Every day | ORAL | Status: DC
  Filled 2015-07-01 (×3): qty 1

## 2015-07-01 MED ORDER — GLUCERNA SHAKE PO LIQD
237.0000 mL | Freq: Three times a day (TID) | ORAL | Status: DC
Start: 2015-07-01 — End: 2015-07-14
  Administered 2015-07-01 – 2015-07-14 (×36): 237 mL via ORAL

## 2015-07-01 NOTE — Progress Notes (Signed)
Patient was able to speak briefly and hold conversation base on topic.

## 2015-07-01 NOTE — Progress Notes (Signed)
Patient ID: [name], female   DOB: 1961/05/05, 54 y.o.   MRN: [id number] D: Pt denies SI/HI and pain. Patient labile, and delusional. reports husband coming this evening to take her home. Pt reports plan to get married at [facility] and invite staff. Pt refuse some of evening medication stating writer is trying to drug her. 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 Pt has pressured speech. Pt attended evening wrap up group. No acute physical distress noted.   A: Met with pt 1:1. Medications administered as prescribed. Support and encouragement provider.   R: Patient is safe on the unit.

## 2015-07-01 NOTE — Progress Notes (Signed)
Inpatient Diabetes Program Recommendations  AACE/ADA: New Consensus Statement on Inpatient Glycemic Control (2015)  Target Ranges:  Prepandial:   less than 140 mg/dL      Peak postprandial:   less than 180 mg/dL (1-2 hours)      Critically ill patients:  140 - 180 mg/dL   Review of Glycemic Control  Diabetes history: DM2 Outpatient Diabetes medications: Glipizide 5 mg QAM (previously on) Current orders for Inpatient glycemic control: Novolog moderate tidwc  Results for [name], [name] (MRN [id number]) as of 07/01/2015 11:36  Ref. Range 06/29/2015 20:03 06/30/2015 11:42 06/30/2015 16:41 06/30/2015 21:24 07/01/2015 06:08  Glucose-Capillary Latest Ref Range: 65-99 mg/dL 93 97 168 (H) 147 (H) 137 (H)   HgbA1C 2 years ago - 5.5%. Needs update.   Inpatient Diabetes Program Recommendations:  Oral Agents: Add glipizide 5 mg QAM (pt was previously on this) HgbA1C: Check HgbA1C to assess glycemic control prior to hospitalization  Will follow. Thank you. Lorenda Peck, RD, LDN, CDE Inpatient Diabetes Coordinator [phone]

## 2015-07-01 NOTE — Progress Notes (Signed)
D) Pt has been labile in mood and affect. Pt is tearful at times, hopeless and helpless. Angry and irritable other times. Pt can be intrusive and needy. [name] has refused her Neurontin, glipizide (allergic), and also insulin. Pt has not been able to sit through any groups this shift. Pt requires frequent redirection to stay on task. Responds well to redirection. A) level 3 obs for safety, support and encouragement provided. Reassurance provided. Redirection and limit setting as needed. Med ed reinforced. EKG done and placed on chart. R) Responds well.

## 2015-07-01 NOTE — Progress Notes (Signed)
D: Pt continues to be very psychotic, verbally and physical aggressive, and intrusive; Pt on several occasions will force herself between another Pt receiving medication at the med window and the nurse. Pt also becomes very loud and angry when she can't have her way. Pt continues to be very delusional; does not believe that she is a Pt here, also thinks she is very wealthy and powerful (delusions of grandeur). Pt is preoccupied with leaving the facility; she's constantly occupying the phones. Pt denies any form of depression, anxiety, pain, SI, HI and AVH.  A: Pt was encouraged to attend group. Medications offered as prescribed.  Support, encouragement, and safe environment provided.  15-minute safety checks continue.   R: Pt was med compliant.  Pt did attend wrap-up group. Safety checks continue.

## 2015-07-01 NOTE — BHH Group Notes (Signed)
[facility] Group Notes:  (Nursing/MHT/Case Management/Adjunct)  Date:  07/01/2015  Time:  1:58 PM  Type of Therapy:  Nurse Education  Participation Level:  Did Not Attend  Participation Quality:    Affect:    Cognitive:    Insight:    Engagement in Group:    Modes of Intervention:  Discussion and Education  Summary of Progress/Problems:  Group topic today was "Recovery."  Discussed what recovery means for everyone.  Discussed goals and developing a "toolbox" for coping skills.  She did not attend because she was upset that the phones had to be turned off and cursed staff out and left.  She tried to come back in the last 2 minutes of group.  She was hyper verbal/off topic and was redirected to nursing staff after group.  Barbette Or [place] Trawick 07/01/2015, 1:58 PM

## 2015-07-01 NOTE — BHH Counselor (Signed)
PSA attempted w collateral contact, [place] home Scientist, physiological, CSW unable to contact [place], could not leave message as mailbox was full.  Edwyna Shell, LCSW Lead Clinical Social Worker Phone:  [phone]

## 2015-07-01 NOTE — BHH Group Notes (Signed)
[facility] LCSW Group Therapy  07/01/2015 , 1:45 PM   Type of Therapy:  Group Therapy  Participation Level:  Active  Participation Quality:  Attentive  Affect:  Appropriate  Cognitive:  Alert  Insight:  Improving  Engagement in Therapy:  Engaged  Modes of Intervention:  Discussion, Exploration and Socialization  Summary of Progress/Problems: Today's group focused on the term Diagnosis.  Participants were asked to define the term, and then pronounce whether it is a negative, positive or neutral term.  Was removed from group room prior to group starting.  [name] B 07/01/2015 , 1:45 PM

## 2015-07-01 NOTE — Progress Notes (Signed)
[facility] MD Progress Note  07/01/2015 1:14 PM OAKLEIGH HESKETH  MRN:  [id number] Subjective: Pt states " I want you talk to Ms.Davis . I am not here for my mental issues , I am taking my medications at home. I sleep fine at home.'   Objective: Patient is a 54 y old CF who has a hx of Bipolar disorder , as well as multiple medical comorbidities , who presented to [facility] by EMS , for nausea after smoking a cigarette. Patient per initial notes in EHR appeared to be disorganized , delusional in the ED.  Patient seen and chart reviewed.Discussed patient with treatment team. Pt today continues to be manic, pressured , disorganized , loose , tangential thought process.It continues to be difficult to follow patient's speech.Pt is focussed on getting discharged , states she has been taking her medications at home. Pt is also seen as loud and intrusive on the unit from time to time and needs a lot of redirection.  Per staff - Pt is hostile and can be verbally aggressive to staff and peers.Pt. Uses obscene language on the unit . Pt was refusing all PO medications yesterday , however today seen as partially compliant on her medications except her Insulin.    Principal Problem: Bipolar disorder, current episode manic severe with psychotic features [facility]) Diagnosis:   Patient Active Problem List   Diagnosis Date Noted  . Bipolar disorder, current episode manic severe with psychotic features ([place]) [F31.2] 06/30/2015  . History of breast cancer in female [Z85.3] 04/10/2015  . Irreducible ventral hernia [K46.0] 04/12/2013  . HTN (hypertension) [I10]   . COPD (chronic obstructive pulmonary disease) ([place]) [J44.9]   . DM (diabetes mellitus) ([place]) [E11.9]   . Chronic kidney disease [N18.9]   . Hyperlipidemia [E78.5]   . Hypothyroidism [E03.9]   . Bipolar affective ([place]) [F31.9]   . Anemia in chronic kidney disease(285.21) [N03.9, D63.1] 07/10/2011  . Thrombocytopenia ([place]) [D69.6] 07/10/2011  . DIABETES  MELLITUS, TYPE II [E11.9] 08/15/2006  . BIPOLAR AFFECTIVE DISORDER [F31.9] 08/15/2006  . SMOKER [F17.200] 08/15/2006  . COPD [J44.9] 08/15/2006  . BOILS, RECURRENT [L02.92, L02.93] 08/15/2006  . ACNE ROSACEA [L71.9] 08/15/2006  . ACNE NEC [L70.8] 08/15/2006  . EDEMA [R60.9] 08/15/2006  . INCONTINENCE, URGE [N39.41] 08/15/2006   Total Time spent with patient: 30 minutes  Past Psychiatric History: Pt with hx of Bipolar disorder , several hospitalizations in the past. Pt unable to provide further details . Will work on obtaining collateral information.   Past Medical History:  Past Medical History  Diagnosis Date  . Anemia 07/10/2011  . Thrombocytopenia ([place]) 07/10/2011  . HTN (hypertension)   . COPD (chronic obstructive pulmonary disease) ([place])   . Hyperlipidemia   . Hypothyroidism   . Bipolar affective ([place])   . DM (diabetes mellitus) ([place])   . Renal insufficiency   . 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 Hypertension     Past Surgical History  Procedure Laterality Date  . Ankle surgery Left 1989  . Tonsillectomy  AGE 77  . Breast surgery Right 2010    BREAST  . Ventral hernia repair N/A 06/06/2013    Procedure:  OPEN VENTRAL HERNIA REPAIR;  Surgeon: Imogene Burn. Georgette Dover, MD;  Location: [facility] ORS;  Service: General;  Laterality: N/A;  . Insertion of mesh N/A 06/06/2013    Procedure: INSERTION OF MESH;  Surgeon: Imogene Burn. Georgette Dover, MD;  Location: [facility] ORS;  Service: General;  Laterality:  N/A;   Family History:  Family History  Problem Relation Age of Onset  . Diabetes Mother   . Cancer Father   . HIV Brother    Family Psychiatric  History: Pt unable to provide details about family hx . Social History: Pt reports she is divorced and lives with a room mate .  History  Alcohol Use No     History  Drug Use No    Social History   Social History  . Marital Status: Divorced    Spouse Name: N/A  . Number of Children: N/A  . Years of Education: N/A    Social History Main Topics  . Smoking status: Current Every Day Smoker -- 0.50 packs/day for 40 years    Types: Cigarettes, Cigars  . Smokeless tobacco: Never Used  . Alcohol Use: No  . Drug Use: No  . Sexual Activity: Not Asked   Other Topics Concern  . None   Social History Narrative   Additional Social History:    History of alcohol / drug use?: No history of alcohol / drug abuse (unable to assess)                    Sleep: Poor- restless as observed  Appetite:  Fair  Current Medications: Current Facility-Administered Medications  Medication Dose Route Frequency Provider Last Rate Last Dose  . acetaminophen (TYLENOL) tablet 650 mg  650 mg Oral Q4H PRN Lurena Nida, NP   650 mg at 06/29/15 0133  . albuterol (PROVENTIL HFA;VENTOLIN HFA) 108 (90 BASE) MCG/ACT inhaler 1-2 puff  1-2 puff Inhalation Q6H PRN Lurena Nida, NP      . alum & mag hydroxide-simeth (MAALOX/MYLANTA) 200-200-20 MG/5ML suspension 30 mL  30 mL Oral PRN Lurena Nida, NP      . ARIPiprazole (ABILIFY) tablet 10 mg  10 mg Oral QHS Ursula Alert, MD      . Derrill Memo ON 07/02/2015] ARIPiprazole (ABILIFY) tablet 5 mg  5 mg Oral Daily Diahn Waidelich, MD      . atorvastatin (LIPITOR) tablet 40 mg  40 mg Oral QHS Lurena Nida, NP   40 mg at 06/30/15 2110  . benztropine (COGENTIN) tablet 0.5 mg  0.5 mg Oral Daily Adalbert Alberto, MD   0.5 mg at 07/01/15 1236  . carvedilol (COREG) tablet 3.125 mg  3.125 mg Oral q morning - 10a Lurena Nida, NP   3.125 mg at 07/01/15 0815  . chlorpheniramine-HYDROcodone (TUSSIONEX) 10-8 MG/5ML suspension 5 mL  5 mL Oral Q12H PRN Lurena Nida, NP   5 mL at 06/30/15 2234  . diphenhydrAMINE (BENADRYL) capsule 50 mg  50 mg Oral Q6H PRN Ursula Alert, MD       Or  . diphenhydrAMINE (BENADRYL) injection 50 mg  50 mg Intramuscular Q6H PRN Zedric Deroy, MD      . divalproex (DEPAKOTE) DR tablet 500 mg  500 mg Oral BID Benjamine Mola, FNP   500 mg at 07/01/15 0804  . gabapentin  (NEURONTIN) capsule 100 mg  100 mg Oral [id number] Javoni Lucken, MD      . glipiZIDE (GLUCOTROL XL) 24 hr tablet 5 mg  5 mg Oral Q breakfast Demetrion Wesby, MD      . haloperidol (HALDOL) tablet 5 mg  5 mg Oral Q6H PRN Ursula Alert, MD   5 mg at 07/01/15 0806   Or  . haloperidol lactate (HALDOL) injection 5 mg  5 mg Intramuscular Q6H PRN Diallo Ponder  Kerington Hildebrant, MD      . insulin aspart (novoLOG) injection 0-15 Units  0-15 Units Subcutaneous TID WC Ursula Alert, MD   0 Units at 06/30/15 1149  . levothyroxine (SYNTHROID, LEVOTHROID) tablet 75 mcg  75 mcg Oral QAC breakfast Lurena Nida, NP   75 mcg at 07/01/15 9758  . LORazepam (ATIVAN) tablet 1 mg  1 mg Oral Q8H PRN Ursula Alert, MD   1 mg at 06/30/15 2110   Or  . LORazepam (ATIVAN) injection 1 mg  1 mg Intramuscular Q8H PRN Ursula Alert, MD      . mometasone-formoterol (DULERA) 100-5 MCG/ACT inhaler 2 puff  2 puff Inhalation BID Lurena Nida, NP   2 puff at 07/01/15 0804  . neomycin-bacitracin-polymyxin (NEOSPORIN) ointment   Topical BID Arben Packman, MD      . nicotine (NICODERM CQ - dosed in mg/24 hours) patch 21 mg  21 mg Transdermal Daily Benjamine Mola, FNP   21 mg at 06/29/15 0755  . ramipril (ALTACE) capsule 2.5 mg  2.5 mg Oral q morning - 10a Lurena Nida, NP   2.5 mg at 07/01/15 1033  . traZODone (DESYREL) tablet 150 mg  150 mg Oral QHS Ursula Alert, MD        Lab Results:  Results for orders placed or performed during the hospital encounter of 06/28/15 (from the past 48 hour(s))  Glucose, capillary     Status: None   Collection Time: 06/29/15  8:03 PM  Result Value Ref Range   Glucose-Capillary 93 65 - 99 mg/dL  Glucose, capillary     Status: None   Collection Time: 06/30/15 11:42 AM  Result Value Ref Range   Glucose-Capillary 97 65 - 99 mg/dL   Comment 1 Notify RN    Comment 2 Document in Chart   Glucose, capillary     Status: Abnormal   Collection Time: 06/30/15  4:41 PM  Result Value Ref Range   Glucose-Capillary  168 (H) 65 - 99 mg/dL  Glucose, capillary     Status: Abnormal   Collection Time: 06/30/15  9:24 PM  Result Value Ref Range   Glucose-Capillary 147 (H) 65 - 99 mg/dL  Glucose, capillary     Status: Abnormal   Collection Time: 07/01/15  6:08 AM  Result Value Ref Range   Glucose-Capillary 137 (H) 65 - 99 mg/dL  TSH     Status: None   Collection Time: 07/01/15  6:35 AM  Result Value Ref Range   TSH 2.753 0.350 - 4.500 uIU/mL    Comment: Performed at [facility]  Lipid panel     Status: None   Collection Time: 07/01/15  6:35 AM  Result Value Ref Range   Cholesterol 148 0 - 200 mg/dL   Triglycerides 74 <150 mg/dL   HDL 52 >40 mg/dL   Total CHOL/HDL Ratio 2.8 RATIO   VLDL 15 0 - 40 mg/dL   LDL Cholesterol 81 0 - 99 mg/dL    Comment:        Total Cholesterol/HDL:CHD Risk Coronary Heart Disease Risk Table                     Men   Women  1/2 Average Risk   3.4   3.3  Average Risk       5.0   4.4  2 X Average Risk   9.6   7.1  3 X Average Risk  23.4   11.0  Use the calculated Patient Ratio above and the CHD Risk Table to determine the patient's CHD Risk.        ATP III CLASSIFICATION (LDL):  <100     mg/dL   Optimal  100-129  mg/dL   Near or Above                    Optimal  130-159  mg/dL   Borderline  160-189  mg/dL   High  >190     mg/dL   Very High Performed at [facility]   Glucose, capillary     Status: Abnormal   Collection Time: 07/01/15 11:37 AM  Result Value Ref Range   Glucose-Capillary 130 (H) 65 - 99 mg/dL   Comment 1 Notify RN    Comment 2 Document in Chart     Physical Findings: AIMS: Facial and Oral Movements Muscles of Facial Expression: None, normal Lips and Perioral Area: None, normal Jaw: None, normal Tongue: None, normal,Extremity Movements Upper (arms, wrists, hands, fingers): None, normal Lower (legs, knees, ankles, toes): None, normal, Trunk Movements Neck, shoulders, hips: None, normal, Overall  Severity Severity of abnormal movements (highest score from questions above): None, normal Incapacitation due to abnormal movements: None, normal Patient's awareness of abnormal movements (rate only patient's report): No Awareness, Dental Status Current problems with teeth and/or dentures?: Yes Does patient usually wear dentures?: No  CIWA:    COWS:     Musculoskeletal: Strength & Muscle Tone: within normal limits Gait & Station: normal Patient leans: N/A  Psychiatric Specialty Exam: Review of Systems  Psychiatric/Behavioral: The patient is nervous/anxious and has insomnia.   All other systems reviewed and are negative.   Blood pressure 121/88, pulse 101, temperature 98.1 F (36.7 C), temperature source Oral, resp. rate 12, height 5\' 5"  (1.651 m), weight 94.348 kg (208 lb), SpO2 95 %.Body mass index is 34.61 kg/(m^2).  General Appearance: Disheveled  Eye Contact::  Good  Speech:  Pressured  Volume:  Increased  Mood:  Euphoric  Affect:  Labile  Thought Process:  Disorganized, Irrelevant, Loose and Tangential  Orientation:  Other:  to person, place  Thought Content:  Delusions, Paranoid Ideation and Rumination  Suicidal Thoughts:  No  Homicidal Thoughts:  No  Memory:  Immediate;   Fair Recent;   Poor Remote;   Poor  Judgement:  Impaired  Insight:  Shallow  Psychomotor Activity:  Increased and Restlessness  Concentration:  Poor  Recall:  [place]  Language: Fair  Akathisia:  No  Handed:  Right  AIMS (if indicated):     Assets:  Others:  access to health care  ADL's:  Impaired  Cognition: Impaired,  Mild  Sleep:  Number of Hours: 1.25   Treatment Plan Summary: Daily contact with patient to assess and evaluate symptoms and progress in treatment and Medication management  Will continue Depakote DR 500 mg po bid for mood lability. Depakote level in 4 days. Will increase Abilify to  5 mg po daily and 10 mg po qhs for psychosis. Pt prefers to be on  Abilify. Has tried Haldol, risperidone in the past. Will increase Trazodone to 150 mg po qhs for sleep. Continue Haldol 5 mg po /IM prn , Benadryl 50 mg PO/IM prn ,Ativan 1 mg PO/IM prn for severe anxiety/agitation. Restart home medications where indicated. Reviewed past medical records,treatment plan.  Diabetic consult for DM - Will restart Glipizide per recommendations - reviewed that Glipizide is on her allergy  list - called Pharmacist - per Pharmacist - she has used it in 2014 , even though Glipizide was placed on her allergy list in 2007. Will continue to monitor vitals ,medication compliance and treatment side effects while patient is here.  Will monitor for medical issues as well as call consult as needed.  Reviewed valproic acid level - subtherapuetic  ,TSH- wnl , lipid panel- wnl , pending PL Hba1c . She has declined ekg . CSW will start working on disposition. CSW to obtain collateral information. Patient to participate in therapeutic milieu .       Lavonne Kinderman MD 07/01/2015, 1:14 PM

## 2015-07-02 LAB — GLUCOSE, CAPILLARY
GLUCOSE-CAPILLARY: 144 mg/dL — AB (ref 65–99)
GLUCOSE-CAPILLARY: 132 mg/dL — AB (ref 65–99)
GLUCOSE-CAPILLARY: 105 mg/dL — AB (ref 65–99)
GLUCOSE-CAPILLARY: 137 mg/dL — AB (ref 65–99)
Glucose-Capillary: 88 mg/dL (ref 65–99)

## 2015-07-02 LAB — HEMOGLOBIN A1C
Hgb A1c MFr Bld: 5.9 % — ABNORMAL HIGH (ref 4.8–5.6)
Mean Plasma Glucose: 123 mg/dL

## 2015-07-02 LAB — PROLACTIN: PROLACTIN: 60 ng/mL — AB (ref 4.8–23.3)

## 2015-07-02 MED ORDER — GABAPENTIN 100 MG PO CAPS
200.0000 mg | ORAL_CAPSULE | ORAL | Status: DC
  Administered 2015-07-02 – 2015-07-08 (×15): 200 mg via ORAL
  Filled 2015-07-02 (×26): qty 2

## 2015-07-02 MED ORDER — NON FORMULARY: 1.0000 mg | Freq: Every day | Status: DC

## 2015-07-02 MED ORDER — ESZOPICLONE 2 MG PO TABS
1.0000 mg | ORAL_TABLET | Freq: Every day | ORAL | Status: DC
  Administered 2015-07-02 – 2015-07-06 (×5): 1 mg via ORAL
  Filled 2015-07-02 (×6): qty 1

## 2015-07-02 MED ORDER — TRAZODONE HCL 50 MG PO TABS: 50.0000 mg | ORAL_TABLET | Freq: Every evening | ORAL | Status: DC | PRN

## 2015-07-02 MED ORDER — ARIPIPRAZOLE 10 MG PO TABS
10.0000 mg | ORAL_TABLET | Freq: Every day | ORAL | Status: DC
  Administered 2015-07-03 – 2015-07-06 (×4): 10 mg via ORAL
  Filled 2015-07-02 (×6): qty 1

## 2015-07-02 NOTE — BHH Group Notes (Signed)
[facility] LCSW Aftercare Discharge Planning Group Note   07/02/2015 1:10 PM  Participation Quality:  Engaged  Mood/Affect:  Excited  Depression Rating:    Anxiety Rating:    Thoughts of Suicide:  No Will you contract for safety?   NA  Current AVH:  denies  Plan for Discharge/Comments:  [name] slipped in about half way through after we had diverted her from entering at the beginning.  She found a seat and appropriately raised her hand to talk.  Was able to wait until I called on her.  This happened several times. Speech is still pressured and somewhat disorganized, but not as much as yesterday.    Transportation Means:   Supports:  [name]

## 2015-07-02 NOTE — BHH Group Notes (Signed)
[facility] LCSW Group Therapy  07/02/2015 1:30 PM  Type of Therapy: Group Therapy  Participation Level:Invited. Chose not to attend.  Summary of Progress/Problems: Shanon Brow from the [facility] was here to tell his story of recovery and play his guitar.  Kara Mead. Marshell Levan 07/02/2015 1:30 PM

## 2015-07-02 NOTE — Progress Notes (Signed)
D:Pt disruptive on the unit. Intrusive. Poor sense of boundaries. Verbally abusive at times "You bitch" has no insight in regards to her behavior. Pt denies SI/HI. Pt going to dining hall to eat meals without difficulty. Reported that pt was using the community phone to call 911. Pt counseled in regards to behavior, unable to process. Paranoid thought process, Pt continues to talk about the "FBI" Delusional. Irritable.   A:Special checks q 15 mins in place for safety. Medication administered per MD order (see eMAR) Constant redirection provided. Encouragement and support provided. Much encouragement needed to take prescribed medication regimen.  R: Pt continues to refuse PRN medication and medication that deviate from her home regimen "Your trying to overdose me" Safety maintained. Will continue to monitor.

## 2015-07-02 NOTE — Plan of Care (Signed)
Problem: Alteration in thought process Goal: STG-Patient is able to follow short directions Outcome: Not Progressing Pt needs constant redirection - able to follow short direction

## 2015-07-02 NOTE — BHH Counselor (Signed)
Adult Comprehensive Assessment  Patient ID: [name], female   DOB: 10/07/1960, 54 y.o.   MRN: [id number]  Information Source: Information source:  ([place], group home administrator)  Current Stressors:  Employment / Job issues: Disability Family Relationships: Has a cousin in [place] who faithfully sends her $3.00 weekly for cigarettes and the [employer] / Lack of resources (include bankruptcy): Fixed income Housing / Lack of housing: Currently lives at [facility] How long has patient lived in current situation?: 8 years with one break when she went to another [facility] for a couple of months  Family History:  Does patient have children?: No  Childhood History:     Education:     Employment/Work Situation:   Employment situation: On disability Why is patient on disability: Mental health How long has patient been on disability: many years Patient's job has been impacted by current illness: No  Pensions consultant:   Museum/gallery curator resources: Armed forces training and education officer Does patient have a Programmer, applications or guardian?: No  Alcohol/Substance Abuse:      Social Support System:      Leisure/Recreation:      Strengths/Needs:      Discharge Plan:   Does patient have access to transportation?: Yes Will patient be returning to same living situation after discharge?: Yes Currently receiving community mental health services: Yes (From Whom) ([place], [employer]) Does patient have financial barriers related to discharge medications?: No  Summary/Recommendations:   Summary and Recommendations (to be completed by the evaluator): [name] is a 54 YO Caucasian female with a diagnosis of bipolar disorder who has lived in the [facility] for the past 8 years.  She has had no previous mental health episodes during that time.  She goes to [facility] for her psychiatric  follow-up and attends [facility]  on a daily basis.  The only stressor [name] could identify was a new staff member that [name] had taken under her wind for "training" and then got angry when she was being told what to do by the staff member, like going to bed when the other residents were. [name] insists the pt is medication compliant.  She is willing to take [name] back when we have her stabilized.  [name] can benefit from crises stabilization, medication managment, therapeutic milieu and referral for services.  [name] B. 07/02/2015

## 2015-07-02 NOTE — Progress Notes (Signed)
[facility] MD Progress Note  07/02/2015 1:55 PM GENIENE LIST  MRN:  [id number] Subjective: Pt states " I want to go home . I am friends with the FBI . I will call them and they are going to come after you.'     Objective: Patient is a 54 y old CF who has a hx of Bipolar disorder , as well as multiple medical comorbidities , who presented to [facility] by EMS , for nausea after smoking a cigarette. Patient per initial notes in EHR appeared to be disorganized , delusional in the ED.  Patient seen and chart reviewed.Discussed patient with treatment team.  Pt today continues to be manic, pressured , disorganized , loose , tangential thought process. Pt continues to have trouble focusing , is unable to sit through an evaluation. Pt continues to have sleep issues. Per staff - pt slept only 2 hrs last night. Pt denies SI/HI, however continues to be delusional and grandiose . Pt continues to need a lot of redirection on the unit - limited participation in milieu.   Per staff - Pt continues to be hostile and can be verbally aggressive to staff and peers.Pt. Uses obscene language on the unit .    Principal Problem: Bipolar disorder, current episode manic severe with psychotic features [facility]) Diagnosis:   Patient Active Problem List   Diagnosis Date Noted  . Bipolar disorder, current episode manic severe with psychotic features ([place]) [F31.2] 06/30/2015  . History of breast cancer in female [Z85.3] 04/10/2015  . Irreducible ventral hernia [K46.0] 04/12/2013  . HTN (hypertension) [I10]   . COPD (chronic obstructive pulmonary disease) ([place]) [J44.9]   . DM (diabetes mellitus) ([place]) [E11.9]   . Chronic kidney disease [N18.9]   . Hyperlipidemia [E78.5]   . Hypothyroidism [E03.9]   . Bipolar affective ([place]) [F31.9]   . Anemia in chronic kidney disease(285.21) [N03.9, D63.1] 07/10/2011  . Thrombocytopenia ([place]) [D69.6] 07/10/2011  . DIABETES MELLITUS, TYPE II [E11.9] 08/15/2006  . BIPOLAR AFFECTIVE  DISORDER [F31.9] 08/15/2006  . SMOKER [F17.200] 08/15/2006  . COPD [J44.9] 08/15/2006  . BOILS, RECURRENT [L02.92, L02.93] 08/15/2006  . ACNE ROSACEA [L71.9] 08/15/2006  . ACNE NEC [L70.8] 08/15/2006  . EDEMA [R60.9] 08/15/2006  . INCONTINENCE, URGE [N39.41] 08/15/2006   Total Time spent with patient: 30 minutes  Past Psychiatric History: Pt with hx of Bipolar disorder , several hospitalizations in the past. Pt unable to provide further details . Will work on obtaining collateral information.   Past Medical History:  Past Medical History  Diagnosis Date  . Anemia 07/10/2011  . Thrombocytopenia ([place]) 07/10/2011  . HTN (hypertension)   . COPD (chronic obstructive pulmonary disease) ([place])   . Hyperlipidemia   . Hypothyroidism   . Bipolar affective ([place])   . DM (diabetes mellitus) ([place])   . Renal insufficiency   . 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 Hypertension     Past Surgical History  Procedure Laterality Date  . Ankle surgery Left 1989  . Tonsillectomy  AGE 58  . Breast surgery Right 2010    BREAST  . Ventral hernia repair N/A 06/06/2013    Procedure:  OPEN VENTRAL HERNIA REPAIR;  Surgeon: Imogene Burn. Georgette Dover, MD;  Location: [facility] ORS;  Service: General;  Laterality: N/A;  . Insertion of mesh N/A 06/06/2013    Procedure: INSERTION OF MESH;  Surgeon: Imogene Burn. Georgette Dover, MD;  Location: [facility] ORS;  Service: General;  Laterality: N/A;   Family  History:  Family History  Problem Relation Age of Onset  . Diabetes Mother   . Cancer Father   . HIV Brother    Family Psychiatric  History: Pt unable to provide details about family hx . Social History: Pt reports she is divorced and lives with a room mate .  History  Alcohol Use No     History  Drug Use No    Social History   Social History  . Marital Status: Divorced    Spouse Name: N/A  . Number of Children: N/A  . Years of Education: N/A   Social History Main Topics  . Smoking status: Current  Every Day Smoker -- 0.50 packs/day for 40 years    Types: Cigarettes, Cigars  . Smokeless tobacco: Never Used  . Alcohol Use: No  . Drug Use: No  . Sexual Activity: Not Asked   Other Topics Concern  . None   Social History Narrative   Additional Social History:    History of alcohol / drug use?: No history of alcohol / drug abuse (unable to assess)                    Sleep: Poor- restless as observed- however pt denies  Appetite:  Fair  Current Medications: Current Facility-Administered Medications  Medication Dose Route Frequency Provider Last Rate Last Dose  . acetaminophen (TYLENOL) tablet 650 mg  650 mg Oral Q4H PRN Lurena Nida, NP   650 mg at 06/29/15 0133  . albuterol (PROVENTIL HFA;VENTOLIN HFA) 108 (90 BASE) MCG/ACT inhaler 1-2 puff  1-2 puff Inhalation Q6H PRN Lurena Nida, NP   2 puff at 07/01/15 1924  . alum & mag hydroxide-simeth (MAALOX/MYLANTA) 200-200-20 MG/5ML suspension 30 mL  30 mL Oral PRN Lurena Nida, NP      . ARIPiprazole (ABILIFY) tablet 10 mg  10 mg Oral QHS Ursula Alert, MD   10 mg at 07/01/15 2150  . ARIPiprazole (ABILIFY) tablet 5 mg  5 mg Oral Daily Brenton Joines, MD   5 mg at 07/02/15 0810  . atorvastatin (LIPITOR) tablet 40 mg  40 mg Oral QHS Lurena Nida, NP   40 mg at 07/01/15 2150  . benztropine (COGENTIN) tablet 0.5 mg  0.5 mg Oral Daily Ursula Alert, MD   0.5 mg at 07/02/15 0810  . carvedilol (COREG) tablet 3.125 mg  3.125 mg Oral q morning - 10a Lurena Nida, NP   3.125 mg at 07/02/15 0944  . chlorpheniramine-HYDROcodone (TUSSIONEX) 10-8 MG/5ML suspension 5 mL  5 mL Oral Q12H PRN Lurena Nida, NP   5 mL at 07/02/15 5361  . diphenhydrAMINE (BENADRYL) capsule 50 mg  50 mg Oral Q6H PRN Ursula Alert, MD       Or  . diphenhydrAMINE (BENADRYL) injection 50 mg  50 mg Intramuscular Q6H PRN  Bolle, MD      . divalproex (DEPAKOTE) DR tablet 500 mg  500 mg Oral BID Benjamine Mola, FNP   500 mg at 07/02/15 0811  . eszopiclone  (LUNESTA) tablet 1 mg  1 mg Oral QHS Liem Copenhaver, MD      . feeding supplement (GLUCERNA SHAKE) (GLUCERNA SHAKE) liquid 237 mL  237 mL Oral TID BM Carolyne Whitsel, MD   237 mL at 07/02/15 0942  . gabapentin (NEURONTIN) capsule 200 mg  200 mg Oral [id number] Kalia Vahey, MD      . haloperidol (HALDOL) tablet 5 mg  5 mg Oral Q6H PRN  Ursula Alert, MD   5 mg at 07/01/15 0806   Or  . haloperidol lactate (HALDOL) injection 5 mg  5 mg Intramuscular Q6H PRN Ellawyn Wogan, MD      . insulin aspart (novoLOG) injection 0-15 Units  0-15 Units Subcutaneous TID WC Ursula Alert, MD   0 Units at 06/30/15 1149  . levothyroxine (SYNTHROID, LEVOTHROID) tablet 75 mcg  75 mcg Oral QAC breakfast Lurena Nida, NP   75 mcg at 07/02/15 0539  . LORazepam (ATIVAN) tablet 1 mg  1 mg Oral Q8H PRN Ursula Alert, MD   1 mg at 07/02/15 7673   Or  . LORazepam (ATIVAN) injection 1 mg  1 mg Intramuscular Q8H PRN Ursula Alert, MD      . mometasone-formoterol (DULERA) 100-5 MCG/ACT inhaler 2 puff  2 puff Inhalation BID Lurena Nida, NP   2 puff at 07/01/15 0804  . multivitamin with minerals tablet 1 tablet  1 tablet Oral Daily Ursula Alert, MD   1 tablet at 07/02/15 0811  . neomycin-bacitracin-polymyxin (NEOSPORIN) ointment   Topical BID Abdulhadi Stopa, MD      . nicotine (NICODERM CQ - dosed in mg/24 hours) patch 21 mg  21 mg Transdermal Daily Benjamine Mola, FNP   21 mg at 06/29/15 0755  . ramipril (ALTACE) capsule 2.5 mg  2.5 mg Oral q morning - 10a Lurena Nida, NP   2.5 mg at 07/02/15 0944  . traZODone (DESYREL) tablet 50 mg  50 mg Oral QHS PRN Ursula Alert, MD        Lab Results:  Results for orders placed or performed during the hospital encounter of 06/28/15 (from the past 48 hour(s))  Glucose, capillary     Status: Abnormal   Collection Time: 06/30/15  4:41 PM  Result Value Ref Range   Glucose-Capillary 168 (H) 65 - 99 mg/dL  Glucose, capillary     Status: Abnormal   Collection Time: 06/30/15  9:24  PM  Result Value Ref Range   Glucose-Capillary 147 (H) 65 - 99 mg/dL  Glucose, capillary     Status: Abnormal   Collection Time: 07/01/15  6:08 AM  Result Value Ref Range   Glucose-Capillary 137 (H) 65 - 99 mg/dL  Hemoglobin A1c     Status: Abnormal   Collection Time: 07/01/15  6:30 AM  Result Value Ref Range   Hgb A1c MFr Bld 5.9 (H) 4.8 - 5.6 %    Comment: (NOTE)         Pre-diabetes: 5.7 - 6.4         Diabetes: >6.4         Glycemic control for adults with diabetes: <7.0    Mean Plasma Glucose 123 mg/dL    Comment: (NOTE) Performed At: [facility] [place], [place] [id number] Lindon Romp MD OX:[id number] Performed at [facility]   TSH     Status: None   Collection Time: 07/01/15  6:35 AM  Result Value Ref Range   TSH 2.753 0.350 - 4.500 uIU/mL    Comment: Performed at [facility]  Lipid panel     Status: None   Collection Time: 07/01/15  6:35 AM  Result Value Ref Range   Cholesterol 148 0 - 200 mg/dL   Triglycerides 74 <150 mg/dL   HDL 52 >40 mg/dL   Total CHOL/HDL Ratio 2.8 RATIO   VLDL 15 0 - 40 mg/dL   LDL Cholesterol 81 0 -  99 mg/dL    Comment:        Total Cholesterol/HDL:CHD Risk Coronary Heart Disease Risk Table                     Men   Women  1/2 Average Risk   3.4   3.3  Average Risk       5.0   4.4  2 X Average Risk   9.6   7.1  3 X Average Risk  23.4   11.0        Use the calculated Patient Ratio above and the CHD Risk Table to determine the patient's CHD Risk.        ATP III CLASSIFICATION (LDL):  <100     mg/dL   Optimal  100-129  mg/dL   Near or Above                    Optimal  130-159  mg/dL   Borderline  160-189  mg/dL   High  >190     mg/dL   Very High Performed at [facility]   Prolactin     Status: Abnormal   Collection Time: 07/01/15  6:35 AM  Result Value Ref Range   Prolactin 60.0 (H) 4.8 - 23.3 ng/mL    Comment: (NOTE) Performed At: [facility] [place], [place] [id number] Lindon Romp MD WI:[id number] Performed at [facility]   Glucose, capillary     Status: Abnormal   Collection Time: 07/01/15 11:37 AM  Result Value Ref Range   Glucose-Capillary 130 (H) 65 - 99 mg/dL   Comment 1 Notify RN    Comment 2 Document in Chart   Glucose, capillary     Status: Abnormal   Collection Time: 07/01/15  5:08 PM  Result Value Ref Range   Glucose-Capillary 143 (H) 65 - 99 mg/dL  Glucose, capillary     Status: Abnormal   Collection Time: 07/01/15  8:42 PM  Result Value Ref Range   Glucose-Capillary 144 (H) 65 - 99 mg/dL  Glucose, capillary     Status: Abnormal   Collection Time: 07/02/15  6:30 AM  Result Value Ref Range   Glucose-Capillary 132 (H) 65 - 99 mg/dL   Comment 1 Notify RN   Glucose, capillary     Status: Abnormal   Collection Time: 07/02/15 12:01 PM  Result Value Ref Range   Glucose-Capillary 105 (H) 65 - 99 mg/dL   Comment 1 Notify RN    Comment 2 Document in Chart     Physical Findings: AIMS: Facial and Oral Movements Muscles of Facial Expression: None, normal Lips and Perioral Area: Minimal Jaw: Minimal Tongue: Minimal,Extremity Movements Upper (arms, wrists, hands, fingers): None, normal Lower (legs, knees, ankles, toes): None, normal, Trunk Movements Neck, shoulders, hips: None, normal, Overall Severity Severity of abnormal movements (highest score from questions above): Minimal Incapacitation due to abnormal movements: None, normal Patient's awareness of abnormal movements (rate only patient's report): No Awareness, Dental Status Current problems with teeth and/or dentures?: Yes Does patient usually wear dentures?: No  CIWA:    COWS:     Musculoskeletal: Strength & Muscle Tone: within normal limits Gait & Station: normal Patient leans: N/A  Psychiatric Specialty Exam: Review of Systems  Psychiatric/Behavioral: The patient is nervous/anxious and has  insomnia.   All other systems reviewed and are negative.   Blood pressure 112/77, pulse 102, temperature 98.1 F (36.7 C), temperature source  Oral, resp. rate 20, height 5\' 5"  (1.651 m), weight 94.348 kg (208 lb), SpO2 95 %.Body mass index is 34.61 kg/(m^2).  General Appearance: Disheveled  Eye Contact::  Good  Speech:  Pressured  Volume:  Increased  Mood:  Euphoric  Affect:  Labile  Thought Process:  Disorganized, Irrelevant, Loose and Tangential  Orientation:  Other:  to person, place  Thought Content:  Delusions, Paranoid Ideation and Rumination  Suicidal Thoughts:  No  Homicidal Thoughts:  No  Memory:  Immediate;   Fair Recent;   Poor Remote;   Poor  Judgement:  Impaired  Insight:  Shallow  Psychomotor Activity:  Increased and Restlessness  Concentration:  Poor  Recall:  [place]  Language: Fair  Akathisia:  No  Handed:  Right  AIMS (if indicated):     Assets:  Others:  access to health care  ADL's:  Impaired  Cognition: Impaired,  Mild  Sleep:  Number of Hours: 3.75   Treatment Plan Summary: Daily contact with patient to assess and evaluate symptoms and progress in treatment and Medication management  Will continue Depakote DR 500 mg po bid for mood lability. Depakote level on 07/03/15 . Will increase Abilify to  10 mg po daily and 10 mg po qhs for psychosis. Pt prefers to be on Abilify. Has tried Haldol, risperidone in the past. Will make Trazodone prn and reduce dose to 50 mg po qhs. Will add Lunesta 1 mg po qhs for sleep. Continue Haldol 5 mg po /IM prn , Benadryl 50 mg PO/IM prn ,Ativan 1 mg PO/IM prn for severe anxiety/agitation. Restart home medications where indicated. Reviewed past medical records,treatment plan.  Diabetic consult for DM -Pt refused Glipizide - states she is allergic. Will reconsult Diabetic coordinator. Will continue to monitor vitals ,medication compliance and treatment side effects while patient is here.  Will monitor for  medical issues as well as call consult as needed.  Reviewed valproic acid level - subtherapuetic  ,TSH- wnl , lipid panel- wnl ,  PL- high - will need to follow on an out patient basis , Hba1c- 5.9 , ekg- qtc wnl. CSW will start working on disposition. CSW to obtain collateral information. Patient to participate in therapeutic milieu .       Iriana Artley MD 07/02/2015, 1:55 PM

## 2015-07-03 DIAGNOSIS — R7303 Prediabetes: Secondary | ICD-10-CM | POA: Clinically undetermined

## 2015-07-03 LAB — GLUCOSE, CAPILLARY
GLUCOSE-CAPILLARY: 106 mg/dL — AB (ref 65–99)
Glucose-Capillary: 125 mg/dL — ABNORMAL HIGH (ref 65–99)

## 2015-07-03 LAB — VALPROIC ACID LEVEL: Valproic Acid Lvl: 28 ug/mL — ABNORMAL LOW (ref 50.0–100.0)

## 2015-07-03 MED ORDER — DIVALPROEX SODIUM 500 MG PO DR TAB
500.0000 mg | DELAYED_RELEASE_TABLET | ORAL | Status: DC
  Administered 2015-07-03 – 2015-07-09 (×18): 500 mg via ORAL
  Filled 2015-07-03 (×21): qty 1

## 2015-07-03 MED ORDER — ARIPIPRAZOLE 15 MG PO TABS
15.0000 mg | ORAL_TABLET | Freq: Every day | ORAL | Status: DC
  Administered 2015-07-03 – 2015-07-05 (×3): 15 mg via ORAL
  Filled 2015-07-03 (×5): qty 1

## 2015-07-03 NOTE — Progress Notes (Signed)
Inpatient Diabetes Program Recommendations  AACE/ADA: New Consensus Statement on Inpatient Glycemic Control (2015)  Target Ranges:  Prepandial:   less than 140 mg/dL      Peak postprandial:   less than 180 mg/dL (1-2 hours)      Critically ill patients:  140 - 180 mg/dL   Review of Glycemic Control  Results for [name], [name] (MRN [id number]) as of 07/03/2015 12:46  Ref. Range 07/01/2015 06:30  Hemoglobin A1C Latest Ref Range: 4.8-5.6 % 5.9 (H)   Results for [name], [name] (MRN [id number]) as of 07/03/2015 12:46  Ref. Range 07/02/2015 12:01 07/02/2015 16:59 07/02/2015 20:33 07/03/2015 06:15 07/03/2015 11:41  Glucose-Capillary Latest Ref Range: 65-99 mg/dL 105 (H) 88 137 (H) 106 (H) 125 (H)   HgbA1C indicates pre-diabetes. Blood sugars < 180 mg/dL. Has received no insulin since admission.  Recommendations: D/C Novolog and CBG checks. Encourage pt to f/u with PCP regarding pre-diabetes at discharge. Would benefit from small amount of weight loss and daily exercise for lifestyle modifications to improve glycemic control to reduce risk of full-blown DM.  Thank you. Lorenda Peck, RD, LDN, CDE Inpatient Diabetes Coordinator [phone]

## 2015-07-03 NOTE — Progress Notes (Signed)
Pt attended and participated in [place] group this evening.

## 2015-07-03 NOTE — Tx Team (Signed)
Date: 06/30/2015 1:18 PM  Progress in Treatment:  Attending groups: Yes Participating in groups: Yes, unfortunately Taking medication as prescribed: No, Pt is not taking medications consistently Tolerating medication: Yes  Family/Significant other contact made: Yes  [name] of group home Patient understands diagnosis: No  Limited insight Discussing patient identified problems/goals with staff: Yes  Medical problems stabilized or resolved: Yes  Denies suicidal/homicidal ideation: Yes Patient has not harmed self or Others: Yes   New problem(s) identified: None identified at this time.   Discharge Plan or Barriers: Per Pt, she is from [facility]; will return there at DC. Follow up [facility] [place]  Additional comments: Meds started: Abilify 21m, Depakote 5036m  Reason for Continuation of Hospitalization:  Medication stabilization Mania Psychosis  Estimated length of stay: 4-5 days  Review of initial/current patient goals per problem list:  1. Goal(s): Patient will participate in aftercare plan  Met:Yes  Target date: 3-5 days from date of admission  As evidenced by: Patient will participate within aftercare plan AEB aftercare provider and housing plan at discharge being identified.  06[facility]30/15: [facility]   5. Goal(s): Patient will demonstrate decreased signs of psychosis   Met: Progressing  Target date: 3-5 days from date of admission  As evidenced by: Patient will demonstrate decreased frequency of AVH or return to baseline function 06/30/15: Pt continues to be actively psychotic; observed to be responding to internal stimuli, delusional thought content  07/03/15: Pt denies AVH and is exhibiting more appropriate behavior. 6. Goal (s): Patient will demonstrate decreased signs of mania  Met: Progressing  Target date: 3-5  days from date of admission  As evidenced by: Patient demonstrate decreased signs of mania AEB decreased mood instability and demonstration of stable mood 06/30/15: Pt continues to exhibit pressured speech, delusional and disorganized thought process, with labile mood.  07/03/15: Pt is still exhibiting pressured speech and flight of ideas. However, pt's impulse control seems to be improving as evidenced by pt raising her hand to speak while in group instead of interrupting. Attendees:  Patient:    Family:    Physician: Dr EaShea Evans10/31/2016 1:18 PM  Nursing: JeLars PinksRN Case manager  06/30/2015 1:18 PM  Clinical Social Worker Rod noClintondale0/31/2016 1:18 PM  Other:  06/30/2015 1:18 PM  Clinical: ElHedy Jacob0/31/2016 1:18 PM  Other: , RN Charge Nurse 06/30/2015 1:18 PM  Other:     LaPeri MarisLCLatanya PresserSW

## 2015-07-03 NOTE — Progress Notes (Addendum)
D: Pt was at the nurse's station upon initial approach, demanding to speak with writer.  Pt has labile affect and mood.  She is intrusive, demanding, disruptive, and argumentative.  Her speech is disorganized and she is very difficult to redirect.  She is disrespectful to peers and focused on using the phone.  She reports her goal is to "do everything, I don't go to the groups, my goal is to get married and have children.  You're an angel."  Pt denies SI/HI, denies hallucinations, denies pain.   A: Introduced self to pt.  Met with pt 1:1 multiple times and provided support and encouragement.  Attempted to redirect pt as needed.  Medications administered per order.  PRN medication administered for anxiety. R: Pt was reluctant to take medications but was compliant with most medications after staff's encouragement.  She refused her scheduled inhaler.  Pt reports she will inform staff if she has thoughts of hurting herself or others.  Will continue to monitor and assess.   

## 2015-07-03 NOTE — Progress Notes (Signed)
Pt reports "I slept like a baby last night."  She is anxious but cooperative with writer this morning.  She reports generalized pain of 6/10.  She is delusional, stating "there's a magical doorway in my room."  She states, "what's that pain pill I got yesterday?  It was a magic pill."  Pt was informed that she has PRN Tylenol available for pain.  PRN medication for pain and anxiety administered.  Pt was appreciative and stated "this Tylenol will knock that pain right out."  She reports she hopes to discharge today because "my brother owes me money and I wanna go shopping."  Pt denies further needs and concerns at this time.  Will continue to monitor and assess.

## 2015-07-03 NOTE — Plan of Care (Signed)
Problem: Consults Goal: Depression Patient Education See Patient Education Module for education specifics.  Outcome: Progressing Nurse discussed depression/coping skills with patient.        

## 2015-07-03 NOTE — [facility] Group Notes (Signed)
The focus of this group is to educate the patient on the purpose and policies of crisis stabilization and provide a format to answer questions about their admission.  The group details unit policies and expectations of patients while admitted.  Patient attended 0900 nurse education orientation group this morning.  Patient actively participated and had appropriate affect.  Patient was alert.  Patient had appropriate insight and appropriate engagement.  Today patient will work on 3 goal for discharge.

## 2015-07-03 NOTE — Progress Notes (Addendum)
D:  Patient has been sitting in dayroom with peers, walking hallway, going back and forth in her room. A:  Medications administered per MD orders.  Emotional support and encouragement given patient. R:  Safety maintained with 15 minute checks. Haldol given for anxiety/agitation this morning.  1220  Patient refused novolog 2 units for cbg of 125 at lunch today.  Patient's self inventory sheet, slept good last night.  Sleep medication was helpful.  Good appetite, low energy level, poor concentration.  Rated depression 8.

## 2015-07-03 NOTE — Progress Notes (Signed)
[facility] MD Progress Note  07/03/2015 1:38 PM NEIDA ELLEGOOD  MRN:  [id number] Subjective: Pt states " I want to go home . I came here to get a job here and they put me in here.'      Objective: Patient is a 54 y old CF who has a hx of Bipolar disorder , as well as multiple medical comorbidities , who presented to [facility] by EMS , for nausea after smoking a cigarette. Patient per initial notes in EHR appeared to be disorganized , delusional in the ED.  Patient seen and chart reviewed.Discussed patient with treatment team.  Pt today continues to be pressured , tangential ,but more calm than the previous days . Pt is more redirectable and appears not to be too focussed on delusional thoughts. Pt per staff has been attending groups , tolerating her medications and slept better last night , even though she spend most of her night time on the day room couch. Pt reports she likes sleeping in the dayroom since she feels her bed is not too comfortable for her . Pt will continue to need encouragement and support.   Principal Problem: Bipolar disorder, current episode manic severe with psychotic features [facility]) Diagnosis:   Patient Active Problem List   Diagnosis Date Noted  . Prediabetes [R73.03] 07/03/2015  . Bipolar disorder, current episode manic severe with psychotic features ([place]) [F31.2] 06/30/2015  . History of breast cancer in female [Z85.3] 04/10/2015  . Irreducible ventral hernia [K46.0] 04/12/2013  . HTN (hypertension) [I10]   . COPD (chronic obstructive pulmonary disease) ([place]) [J44.9]   . Chronic kidney disease [N18.9]   . Hyperlipidemia [E78.5]   . Hypothyroidism [E03.9]   . Bipolar affective ([place]) [F31.9]   . Anemia in chronic kidney disease(285.21) [N03.9, D63.1] 07/10/2011  . Thrombocytopenia ([place]) [D69.6] 07/10/2011  . BIPOLAR AFFECTIVE DISORDER [F31.9] 08/15/2006  . SMOKER [F17.200] 08/15/2006  . COPD [J44.9] 08/15/2006  . BOILS, RECURRENT [L02.92, L02.93] 08/15/2006  . ACNE  ROSACEA [L71.9] 08/15/2006  . ACNE NEC [L70.8] 08/15/2006  . EDEMA [R60.9] 08/15/2006  . INCONTINENCE, URGE [N39.41] 08/15/2006   Total Time spent with patient: 25 minutes  Past Psychiatric History: Pt with hx of Bipolar disorder , several hospitalizations in the past. Pt unable to provide further details . Will work on obtaining collateral information.   Past Medical History:  Past Medical History  Diagnosis Date  . Anemia 07/10/2011  . Thrombocytopenia ([place]) 07/10/2011  . HTN (hypertension)   . COPD (chronic obstructive pulmonary disease) ([place])   . Hyperlipidemia   . Hypothyroidism   . Bipolar affective ([place])   . DM (diabetes mellitus) ([place])   . Renal insufficiency   . Cancer ([place]) 2012    RIGHT BREAST, RADIATION DONE, NO CHEMO  . Hypertension     Past Surgical History  Procedure Laterality Date  . Ankle surgery Left 1989  . Tonsillectomy  AGE 41  . Breast surgery Right 2010    BREAST  . Ventral hernia repair N/A 06/06/2013    Procedure:  OPEN VENTRAL HERNIA REPAIR;  Surgeon: Imogene Burn. Georgette Dover, MD;  Location: [facility] ORS;  Service: General;  Laterality: N/A;  . Insertion of mesh N/A 06/06/2013    Procedure: INSERTION OF MESH;  Surgeon: Imogene Burn. Georgette Dover, MD;  Location: [facility]S;  Service: General;  Laterality: N/A;   Family History:  Family History  Problem Relation Age of Onset  . Diabetes Mother   . Cancer Father   . HIV Brother  Family Psychiatric  History: Pt unable to provide details about family hx . Social History: Pt reports she is divorced and lives with a room mate .  History  Alcohol Use No     History  Drug Use No    Social History   Social History  . Marital Status: Divorced    Spouse Name: N/A  . Number of Children: N/A  . Years of Education: N/A   Social History Main Topics  . Smoking status: Current Every Day Smoker -- 0.50 packs/day for 40 years    Types: Cigarettes, Cigars  . Smokeless tobacco: Never Used  . Alcohol Use: No  . Drug Use: No   . Sexual Activity: Not Asked   Other Topics Concern  . None   Social History Narrative   Additional Social History:    History of alcohol / drug use?: No history of alcohol / drug abuse (unable to assess)                    Sleep: Fair-  Appetite:  Fair  Current Medications: Current Facility-Administered Medications  Medication Dose Route Frequency Provider Last Rate Last Dose  . acetaminophen (TYLENOL) tablet 650 mg  650 mg Oral Q4H PRN Lurena Nida, NP   650 mg at 07/03/15 0530  . albuterol (PROVENTIL HFA;VENTOLIN HFA) 108 (90 BASE) MCG/ACT inhaler 1-2 puff  1-2 puff Inhalation Q6H PRN Lurena Nida, NP   2 puff at 07/01/15 1924  . alum & mag hydroxide-simeth (MAALOX/MYLANTA) 200-200-20 MG/5ML suspension 30 mL  30 mL Oral PRN Lurena Nida, NP      . ARIPiprazole (ABILIFY) tablet 10 mg  10 mg Oral Daily Ursula Alert, MD   10 mg at 07/03/15 0826  . ARIPiprazole (ABILIFY) tablet 15 mg  15 mg Oral QHS Trystan Akhtar, MD      . atorvastatin (LIPITOR) tablet 40 mg  40 mg Oral QHS Lurena Nida, NP   40 mg at 07/02/15 2101  . benztropine (COGENTIN) tablet 0.5 mg  0.5 mg Oral Daily Harshal Sirmon, MD   0.5 mg at 07/03/15 0825  . carvedilol (COREG) tablet 3.125 mg  3.125 mg Oral q morning - 10a Lurena Nida, NP   3.125 mg at 07/03/15 7672  . chlorpheniramine-HYDROcodone (TUSSIONEX) 10-8 MG/5ML suspension 5 mL  5 mL Oral Q12H PRN Lurena Nida, NP   5 mL at 07/02/15 1901  . diphenhydrAMINE (BENADRYL) capsule 50 mg  50 mg Oral Q6H PRN Ursula Alert, MD       Or  . diphenhydrAMINE (BENADRYL) injection 50 mg  50 mg Intramuscular Q6H PRN Una Yeomans, MD      . divalproex (DEPAKOTE) DR tablet 500 mg  500 mg Oral [id number] Alanys Godino, MD      . eszopiclone (LUNESTA) tablet 1 mg  1 mg Oral QHS Ursula Alert, MD   1 mg at 07/02/15 2101  . feeding supplement (GLUCERNA SHAKE) (GLUCERNA SHAKE) liquid 237 mL  237 mL Oral TID BM Aino Heckert, MD   237 mL at 07/03/15 0929  .  gabapentin (NEURONTIN) capsule 200 mg  200 mg Oral [id number] Luvern Mcisaac, MD   200 mg at 07/03/15 0825  . haloperidol (HALDOL) tablet 5 mg  5 mg Oral Q6H PRN Ursula Alert, MD   5 mg at 07/03/15 0831   Or  . haloperidol lactate (HALDOL) injection 5 mg  5 mg Intramuscular Q6H PRN Ursula Alert, MD      .  levothyroxine (SYNTHROID, LEVOTHROID) tablet 75 mcg  75 mcg Oral QAC breakfast Lurena Nida, NP   75 mcg at 07/03/15 0616  . LORazepam (ATIVAN) tablet 1 mg  1 mg Oral Q8H PRN Ursula Alert, MD   1 mg at 07/03/15 0524   Or  . LORazepam (ATIVAN) injection 1 mg  1 mg Intramuscular Q8H PRN Ursula Alert, MD      . mometasone-formoterol (DULERA) 100-5 MCG/ACT inhaler 2 puff  2 puff Inhalation BID Lurena Nida, NP   2 puff at 07/01/15 0804  . multivitamin with minerals tablet 1 tablet  1 tablet Oral Daily Ursula Alert, MD   1 tablet at 07/03/15 0826  . neomycin-bacitracin-polymyxin (NEOSPORIN) ointment   Topical BID Fariha Goto, MD      . nicotine (NICODERM CQ - dosed in mg/24 hours) patch 21 mg  21 mg Transdermal Daily Benjamine Mola, FNP   21 mg at 06/29/15 0755  . ramipril (ALTACE) capsule 2.5 mg  2.5 mg Oral q morning - 10a Lurena Nida, NP   2.5 mg at 07/03/15 7867    Lab Results:  Results for orders placed or performed during the hospital encounter of 06/28/15 (from the past 48 hour(s))  Glucose, capillary     Status: Abnormal   Collection Time: 07/01/15  5:08 PM  Result Value Ref Range   Glucose-Capillary 143 (H) 65 - 99 mg/dL  Glucose, capillary     Status: Abnormal   Collection Time: 07/01/15  8:42 PM  Result Value Ref Range   Glucose-Capillary 144 (H) 65 - 99 mg/dL  Glucose, capillary     Status: Abnormal   Collection Time: 07/02/15  6:30 AM  Result Value Ref Range   Glucose-Capillary 132 (H) 65 - 99 mg/dL   Comment 1 Notify RN   Glucose, capillary     Status: Abnormal   Collection Time: 07/02/15 12:01 PM  Result Value Ref Range   Glucose-Capillary 105 (H) 65 - 99  mg/dL   Comment 1 Notify RN    Comment 2 Document in Chart   Glucose, capillary     Status: None   Collection Time: 07/02/15  4:59 PM  Result Value Ref Range   Glucose-Capillary 88 65 - 99 mg/dL  Glucose, capillary     Status: Abnormal   Collection Time: 07/02/15  8:33 PM  Result Value Ref Range   Glucose-Capillary 137 (H) 65 - 99 mg/dL   Comment 1 Notify RN   Glucose, capillary     Status: Abnormal   Collection Time: 07/03/15  6:15 AM  Result Value Ref Range   Glucose-Capillary 106 (H) 65 - 99 mg/dL   Comment 1 Notify RN   Valproic acid level     Status: Abnormal   Collection Time: 07/03/15  6:25 AM  Result Value Ref Range   Valproic Acid Lvl 28 (L) 50.0 - 100.0 ug/mL    Comment: Performed at [facility]  Glucose, capillary     Status: Abnormal   Collection Time: 07/03/15 11:41 AM  Result Value Ref Range   Glucose-Capillary 125 (H) 65 - 99 mg/dL   Comment 1 Notify RN    Comment 2 Document in Chart     Physical Findings: AIMS: Facial and Oral Movements Muscles of Facial Expression: None, normal Lips and Perioral Area: None, normal Jaw: None, normal Tongue: None, normal,Extremity Movements Upper (arms, wrists, hands, fingers): None, normal Lower (legs, knees, ankles, toes): None, normal, Trunk Movements Neck, shoulders, hips:  None, normal, Overall Severity Severity of abnormal movements (highest score from questions above): None, normal Incapacitation due to abnormal movements: None, normal Patient's awareness of abnormal movements (rate only patient's report): No Awareness, Dental Status Current problems with teeth and/or dentures?: Yes Does patient usually wear dentures?: No  CIWA:  CIWA-Ar Total: 2 COWS:  COWS Total Score: 2  Musculoskeletal: Strength & Muscle Tone: within normal limits Gait & Station: normal Patient leans: N/A  Psychiatric Specialty Exam: Review of Systems  Psychiatric/Behavioral: The patient is nervous/anxious.   All other  systems reviewed and are negative.   Blood pressure 104/73, pulse 93, temperature 98.8 F (37.1 C), temperature source Oral, resp. rate 18, height 5\' 5"  (1.651 m), weight 94.348 kg (208 lb), SpO2 95 %.Body mass index is 34.61 kg/(m^2).  General Appearance: Disheveled is malodorous   Eye Contact::  Good  Speech:  Pressured  Volume:  Increased  Mood:  Euphoric improving  Affect:  Labile  Thought Process:  Disorganized, Irrelevant and Tangential improving  Orientation:  Other:  to person, place  Thought Content:  Delusions, Paranoid Ideation and Rumination  Suicidal Thoughts:  No  Homicidal Thoughts:  No  Memory:  Immediate;   Fair Recent;   Poor Remote;   Poor  Judgement:  Impaired  Insight:  Shallow  Psychomotor Activity:  Increased  Concentration:  Poor  Recall:  [place]  Language: Fair  Akathisia:  No  Handed:  Right  AIMS (if indicated):     Assets:  Others:  access to health care  ADL's:  Impaired  Cognition: Impaired,  Mild  Sleep:  Number of Hours: 6.5   Treatment Plan Summary: Daily contact with patient to assess and evaluate symptoms and progress in treatment and Medication management  Will increase Depakote DR 500 mg po tid for mood lability. Depakote level on 07/03/15 - 28 ug/ml- subtherapeutic.. Will increase Abilify to  10 mg po daily and 15 mg po qhs for psychosis. Pt prefers to be on Abilify. Has tried Haldol, risperidone in the past. Will continue Lunesta 1 mg po qhs for sleep.Will discontinue Trazodone. Continue Haldol 5 mg po /IM prn , Benadryl 50 mg PO/IM prn ,Ativan 1 mg PO/IM prn for severe anxiety/agitation. Restart home medications where indicated. Reviewed past medical records,treatment plan.  Per Diabetic consult - Pt with prediabetes - recommends discontinuing SSI as well as CBG monitoring.Will DC both today. Will continue to monitor vitals ,medication compliance and treatment side effects while patient is here.  Will monitor for  medical issues as well as call consult as needed.  Reviewed valproic acid level - subtherapuetic  ,TSH- wnl , lipid panel- wnl ,  PL- high - will need to follow on an out patient basis , Hba1c- 5.9 , ekg- qtc wnl. CSW will start working on disposition. CSW to obtain collateral information. Patient to participate in therapeutic milieu .       Jorma Tassinari MD 07/03/2015, 1:38 PM

## 2015-07-03 NOTE — BHH Group Notes (Signed)
[place] LCSW Group Therapy  07/03/2015 1:15 pm  Type of Therapy: Process Group Therapy  Participation Level:  Active  Participation Quality:  Appropriate  Affect:  Flat  Cognitive:  Oriented  Insight:  Improving  Engagement in Group:  Limited  Engagement in Therapy:  Limited  Modes of Intervention:  Activity, Clarification, Education, Problem-solving and Support  Summary of Progress/Problems: Today's group addressed the issue of overcoming obstacles.  Patients were asked to identify their biggest obstacle post d/c that stands in the way of their on-going success, and then problem solve as to how to manage this.  Irrelevent contributions, but funny.  A challenge to redirect at times.    [name] 07/03/2015   2:12 PM

## 2015-07-04 DIAGNOSIS — L989 Disorder of the skin and subcutaneous tissue, unspecified: Secondary | ICD-10-CM

## 2015-07-04 MED ORDER — LEVOFLOXACIN 500 MG PO TABS
500.0000 mg | ORAL_TABLET | Freq: Every day | ORAL | Status: AC
  Administered 2015-07-04: 500 mg via ORAL
  Filled 2015-07-04: qty 1

## 2015-07-04 MED ORDER — LEVOFLOXACIN 250 MG PO TABS
250.0000 mg | ORAL_TABLET | Freq: Every day | ORAL | Status: DC
  Administered 2015-07-05: 250 mg via ORAL
  Filled 2015-07-04 (×3): qty 1

## 2015-07-04 MED ORDER — HYDROCORTISONE 1 % EX CREA
TOPICAL_CREAM | Freq: Three times a day (TID) | CUTANEOUS | Status: DC
  Administered 2015-07-04 – 2015-07-14 (×30): via TOPICAL
  Filled 2015-07-04 (×2): qty 28

## 2015-07-04 MED ORDER — HYDROXYZINE HCL 25 MG PO TABS
25.0000 mg | ORAL_TABLET | Freq: Three times a day (TID) | ORAL | Status: DC
  Administered 2015-07-04 – 2015-07-08 (×12): 25 mg via ORAL
  Filled 2015-07-04 (×18): qty 1

## 2015-07-04 NOTE — ED Provider Notes (Signed)
CSN: [id number]     Arrival date & time 06/23/15  0509 History   First MD Initiated Contact with Patient 06/23/15 0703     Chief Complaint  Patient presents with  . multiple complaints      (Consider location/radiation/quality/duration/timing/severity/associated sxs/prior Treatment) HPI   54 year old female with dyspnea. Gradual onset yesterday. Persistent since then. Occasional nonproductive cough. No fevers or chills. Denies any acute pain. No unusual leg pain or swelling. Is a smoker. No intervention prior to arrival. Review of systems is positive for nausea. Denies abdominal pain. No vomiting. No orthopnea.  Past Medical History  Diagnosis Date  . Anemia 07/10/2011  . Thrombocytopenia ([place]) 07/10/2011  . HTN (hypertension)   . COPD (chronic obstructive pulmonary disease) ([place])   . Hyperlipidemia   . Hypothyroidism   . Bipolar affective ([place])   . DM (diabetes mellitus) ([place])   . Renal insufficiency   . Cancer ([place]) 2012    RIGHT BREAST, RADIATION DONE, NO CHEMO  . Hypertension    Past Surgical History  Procedure Laterality Date  . Ankle surgery Left 1989  . Tonsillectomy  AGE 41  . Breast surgery Right 2010    BREAST  . Ventral hernia repair N/A 06/06/2013    Procedure:  OPEN VENTRAL HERNIA REPAIR;  Surgeon: Imogene Burn. Georgette Dover, MD;  Location: [facility];  Service: General;  Laterality: N/A;  . Insertion of mesh N/A 06/06/2013    Procedure: INSERTION OF MESH;  Surgeon: Imogene Burn. Georgette Dover, MD;  Location: [facility];  Service: General;  Laterality: N/A;   Family History  Problem Relation Age of Onset  . Diabetes Mother   . Cancer Father   . HIV Brother    Social History  Substance Use Topics  . Smoking status: Current Every Day Smoker -- 0.50 packs/day for 40 years    Types: Cigarettes, Cigars  . Smokeless tobacco: Never Used  . Alcohol Use: No   OB History    No data available     Review of Systems  All systems reviewed and negative, other than as noted in  HPI.   Allergies  Doxycycline; Glipizide; Ibuprofen; Penicillins; Sulfonamide derivatives; Tetracycline; Tolterodine tartrate; Chlorostat; and Oxybutynin chloride  Home Medications   Prior to Admission medications   Medication Sig Start Date End Date Taking? Authorizing Provider  albuterol (PROVENTIL HFA;VENTOLIN HFA) 108 (90 BASE) MCG/ACT inhaler Inhale 1-2 puffs into the lungs every 6 (six) hours as needed for wheezing or shortness of breath.   Yes Historical Provider, MD  atorvastatin (LIPITOR) 40 MG tablet Take 40 mg by mouth at bedtime.    Yes Historical Provider, MD  calcitRIOL (ROCALTROL) 0.25 MCG capsule Take 0.25 mcg by mouth every Monday, Wednesday, and Friday.   Yes Historical Provider, MD  carvedilol (COREG) 3.125 MG tablet Take 3.125 mg by mouth every morning.    Yes Historical Provider, MD  divalproex (DEPAKOTE) 250 MG DR tablet Take 250 mg by mouth 2 (two) times daily.    Yes Historical Provider, MD  Fluticasone-Salmeterol (ADVAIR) 250-50 MCG/DOSE AEPB Inhale 1 puff into the lungs 2 (two) times daily.   Yes Historical Provider, MD  haloperidol (HALDOL) 2 MG tablet Take 2 mg by mouth 2 (two) times daily. 04/17/15  Yes Historical Provider, MD  levothyroxine (SYNTHROID, LEVOTHROID) 75 MCG tablet Take 75 mcg by mouth daily before breakfast.    Yes Historical Provider, MD  Multiple Vitamin (MULTIVITAMIN WITH MINERALS) TABS tablet Take 1 tablet by mouth daily.   Yes Historical Provider,  MD  ramipril (ALTACE) 2.5 MG capsule Take 2.5 mg by mouth every morning.  04/13/12  Yes Historical Provider, MD  traZODone (DESYREL) 50 MG tablet Take 50-100 mg by mouth at bedtime as needed for sleep.   Yes Historical Provider, MD  benzonatate (TESSALON) 100 MG capsule Take 1 capsule (100 mg total) by mouth every 8 (eight) hours. 06/23/15   Virgel Manifold, MD   BP 144/76 mmHg  Pulse 79  Temp(Src) 97.8 F (36.6 C) (Oral)  Resp 16  Ht 5' 5.5" (1.664 m)  SpO2 95% Physical Exam  Constitutional: She  appears well-developed and well-nourished. No distress.  HENT:  Head: Normocephalic and atraumatic.  Eyes: Conjunctivae are normal. Right eye exhibits no discharge. Left eye exhibits no discharge.  Neck: Neck supple.  Cardiovascular: Normal rate, regular rhythm and normal heart sounds.  Exam reveals no gallop and no friction rub.   No murmur heard. Pulmonary/Chest: Effort normal. No respiratory distress. She has wheezes.  Faint expiratory wheezing. Speaks in complete sentences. No sensory muscle usage.  Abdominal: Soft. She exhibits no distension. There is no tenderness.  Musculoskeletal: She exhibits no edema or tenderness.  Lower extremities symmetric as compared to each other. No calf tenderness. Negative Homan's. No palpable cords.   Neurological: She is alert.  Skin: Skin is warm and dry.  Psychiatric: She has a normal mood and affect. Her behavior is normal. Thought content normal.  Nursing note and vitals reviewed.   ED Course  Procedures (including critical care time) Labs Review Labs Reviewed - No data to display  Imaging Review No results found.   Dg Chest 2 View  06/23/2015  CLINICAL DATA:  Productive cough. Chest congestion for several days, worsening. EXAM: CHEST  2 VIEW COMPARISON:  06/04/2013. FINDINGS: The cardiac silhouette remains borderline enlarged. Clear lungs. Mild central peribronchial thickening. Thoracic spine degenerative changes, including changes of DISH. Right breast or inferior axillary surgical clips. IMPRESSION: Mild bronchitic changes. Electronically Signed   By: Claudie Revering M.D.   On: 06/23/2015 09:55   I have personally reviewed and evaluated these images and lab results as part of my medical decision-making.   EKG Interpretation None      MDM   Final diagnoses:  Bronchitis    54 year old female with dyspnea. Suspect viral bronchitis. She is generally well appearing. No increased work of breathing.    Virgel Manifold, MD 07/04/15  1444

## 2015-07-04 NOTE — BHH Group Notes (Signed)
[place] LCSW Group Therapy   07/04/2015 1:32 PM  Type of Therapy: Group Therapy  Participation Level:  Active  Participation Quality:  Attentive  Affect:  Flat  Cognitive:  Oriented  Insight:  Limited  Engagement in Therapy:  Engaged  Modes of Intervention:  Discussion and Socialization  Summary of Progress/Problems: Chaplain was here to lead a group on themes of hope and/or courage.  Pt was pleasant but did nod off a few times during group. Pt is still exhibiting pressured speech and flight of attitudes. She spoke about finding hope through her faith in [place] and how she enjoys talking with Him. Pt also spoke about feeling comfortable in the hospital. "I'm more at home here than I am at my own house".  Georga Kaufmann 07/04/2015 1:32 PM

## 2015-07-04 NOTE — Progress Notes (Addendum)
[facility] MD Progress Note  07/04/2015 1:55 PM [name]  MRN:  [id number] Subjective: Pt states " I am calm, I slept last night, can I leave ?"       Objective: Patient is a 58 y old CF who has a hx of Bipolar disorder , as well as multiple medical comorbidities , who presented to [facility] by EMS , for nausea after smoking a cigarette. Patient per initial notes in EHR appeared to be disorganized , delusional in the ED.  Patient seen and chart reviewed.Discussed patient with treatment team.  Pt today continues to be pressured , its difficult to understand her speech due to her being very pressured and tangential. However she is more organized than on admission. Pt per staff continues to be intrusive , and restless. Pt however slept better last night , which is an improvement for her. Pt this AM was encouraged to take a shower since she was disheveled and malodorous . During her bath staff noticed several lesions on her back - which she reported as bug bites . O/E she had several open lesions likely due to scratching as well as small red sores all over her back as well as 1-2 on her scalp. Per review of PE done when pt arrived - this was not noted during that time . HOwever , since pt was very manic and disorganized and uncooperative on arrival , this could have been missed during the initial PE. Pt will continue to need encouragement and support.   Principal Problem: Bipolar disorder, current episode manic severe with psychotic features [facility]) Diagnosis:   Patient Active Problem List   Diagnosis Date Noted  . Skin lesions [L98.9] 07/04/2015  . Prediabetes [R73.03] 07/03/2015  . Bipolar disorder, current episode manic severe with psychotic features ([place]) [F31.2] 06/30/2015  . History of breast cancer in female [Z85.3] 04/10/2015  . Irreducible ventral hernia [K46.0] 04/12/2013  . HTN (hypertension) [I10]   . COPD (chronic obstructive pulmonary disease) ([place]) [J44.9]   . Chronic kidney  disease [N18.9]   . Hyperlipidemia [E78.5]   . Hypothyroidism [E03.9]   . Bipolar affective ([place]) [F31.9]   . Anemia in chronic kidney disease(285.21) [N03.9, D63.1] 07/10/2011  . Thrombocytopenia ([place]) [D69.6] 07/10/2011  . BIPOLAR AFFECTIVE DISORDER [F31.9] 08/15/2006  . SMOKER [F17.200] 08/15/2006  . COPD [J44.9] 08/15/2006  . BOILS, RECURRENT [L02.92, L02.93] 08/15/2006  . ACNE ROSACEA [L71.9] 08/15/2006  . ACNE NEC [L70.8] 08/15/2006  . EDEMA [R60.9] 08/15/2006  . INCONTINENCE, URGE [N39.41] 08/15/2006   Total Time spent with patient: 25 minutes  Past Psychiatric History: Pt with hx of Bipolar disorder , several hospitalizations in the past. Pt unable to provide further details . Will work on obtaining collateral information.   Past Medical History:  Past Medical History  Diagnosis Date  . Anemia 07/10/2011  . Thrombocytopenia ([place]) 07/10/2011  . HTN (hypertension)   . COPD (chronic obstructive pulmonary disease) ([place])   . Hyperlipidemia   . Hypothyroidism   . Bipolar affective ([place])   . DM (diabetes mellitus) ([place])   . Renal insufficiency   . 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 Hypertension     Past Surgical History  Procedure Laterality Date  . Ankle surgery Left 1989  . Tonsillectomy  AGE 47  . Breast surgery Right 2010    BREAST  . Ventral hernia repair N/A 06/06/2013    Procedure:  OPEN VENTRAL HERNIA REPAIR;  Surgeon: Imogene Burn.  Georgette Dover, MD;  Location: [facility] ORS;  Service: General;  Laterality: N/A;  . Insertion of mesh N/A 06/06/2013    Procedure: INSERTION OF MESH;  Surgeon: Imogene Burn. Georgette Dover, MD;  Location: [facility] ORS;  Service: General;  Laterality: N/A;   Family History:  Family History  Problem Relation Age of Onset  . Diabetes Mother   . Cancer Father   . HIV Brother    Family Psychiatric  History: Pt unable to provide details about family hx . Social History: Pt reports she is divorced and lives with a room mate .  History   Alcohol Use No     History  Drug Use No    Social History   Social History  . Marital Status: Divorced    Spouse Name: N/A  . Number of Children: N/A  . Years of Education: N/A   Social History Main Topics  . Smoking status: Current Every Day Smoker -- 0.50 packs/day for 40 years    Types: Cigarettes, Cigars  . Smokeless tobacco: Never Used  . Alcohol Use: No  . Drug Use: No  . Sexual Activity: Not Asked   Other Topics Concern  . None   Social History Narrative   Additional Social History:    History of alcohol / drug use?: No history of alcohol / drug abuse (unable to assess)                    Sleep: Fair-  Appetite:  Fair  Current Medications: Current Facility-Administered Medications  Medication Dose Route Frequency Provider Last Rate Last Dose  . acetaminophen (TYLENOL) tablet 650 mg  650 mg Oral Q4H PRN Lurena Nida, NP   650 mg at 07/03/15 1724  . albuterol (PROVENTIL HFA;VENTOLIN HFA) 108 (90 BASE) MCG/ACT inhaler 1-2 puff  1-2 puff Inhalation Q6H PRN Lurena Nida, NP   2 puff at 07/01/15 1924  . alum & mag hydroxide-simeth (MAALOX/MYLANTA) 200-200-20 MG/5ML suspension 30 mL  30 mL Oral PRN Lurena Nida, NP      . ARIPiprazole (ABILIFY) tablet 10 mg  10 mg Oral Daily Ursula Alert, MD   10 mg at 07/04/15 0814  . ARIPiprazole (ABILIFY) tablet 15 mg  15 mg Oral QHS Ursula Alert, MD   15 mg at 07/03/15 2257  . atorvastatin (LIPITOR) tablet 40 mg  40 mg Oral QHS Lurena Nida, NP   40 mg at 07/03/15 2257  . benztropine (COGENTIN) tablet 0.5 mg  0.5 mg Oral Daily Ursula Alert, MD   0.5 mg at 07/04/15 0814  . carvedilol (COREG) tablet 3.125 mg  3.125 mg Oral q morning - 10a Lurena Nida, NP   3.125 mg at 07/04/15 0815  . chlorpheniramine-HYDROcodone (TUSSIONEX) 10-8 MG/5ML suspension 5 mL  5 mL Oral Q12H PRN Lurena Nida, NP   5 mL at 07/03/15 1621  . diphenhydrAMINE (BENADRYL) capsule 50 mg  50 mg Oral Q6H PRN Ursula Alert, MD       Or  .  diphenhydrAMINE (BENADRYL) injection 50 mg  50 mg Intramuscular Q6H PRN Deniese Oberry, MD      . divalproex (DEPAKOTE) DR tablet 500 mg  500 mg Oral [id number] Sharah Finnell, MD   500 mg at 07/04/15 0814  . eszopiclone (LUNESTA) tablet 1 mg  1 mg Oral QHS Ursula Alert, MD   1 mg at 07/03/15 2258  . feeding supplement (GLUCERNA SHAKE) (GLUCERNA SHAKE) liquid 237 mL  237 mL Oral TID BM Charlton Boule  Faaris Arizpe, MD   237 mL at 07/04/15 1000  . gabapentin (NEURONTIN) capsule 200 mg  200 mg Oral [id number] Leviticus Harton, MD   200 mg at 07/04/15 0815  . haloperidol (HALDOL) tablet 5 mg  5 mg Oral Q6H PRN Ursula Alert, MD   5 mg at 07/03/15 0831   Or  . haloperidol lactate (HALDOL) injection 5 mg  5 mg Intramuscular Q6H PRN Ursula Alert, MD      . hydrocortisone cream 1 %   Topical TID Ursula Alert, MD      . hydrOXYzine (ATARAX/VISTARIL) tablet 25 mg  25 mg Oral TID Ursula Alert, MD   25 mg at 07/04/15 0930  . [START ON 07/05/2015] levofloxacin (LEVAQUIN) tablet 250 mg  250 mg Oral Daily Aneesa Romey, MD      . levofloxacin (LEVAQUIN) tablet 500 mg  500 mg Oral Daily Inga Noller, MD      . levothyroxine (SYNTHROID, LEVOTHROID) tablet 75 mcg  75 mcg Oral QAC breakfast Lurena Nida, NP   75 mcg at 07/04/15 8502  . LORazepam (ATIVAN) tablet 1 mg  1 mg Oral Q8H PRN Ursula Alert, MD   1 mg at 07/03/15 1612   Or  . LORazepam (ATIVAN) injection 1 mg  1 mg Intramuscular Q8H PRN Ursula Alert, MD      . mometasone-formoterol (DULERA) 100-5 MCG/ACT inhaler 2 puff  2 puff Inhalation BID Lurena Nida, NP   2 puff at 07/01/15 0804  . multivitamin with minerals tablet 1 tablet  1 tablet Oral Daily Ursula Alert, MD   1 tablet at 07/04/15 0816  . neomycin-bacitracin-polymyxin (NEOSPORIN) ointment   Topical BID Leidy Massar, MD      . nicotine (NICODERM CQ - dosed in mg/24 hours) patch 21 mg  21 mg Transdermal Daily Benjamine Mola, FNP   21 mg at 07/04/15 0817  . ramipril (ALTACE) capsule 2.5 mg  2.5 mg  Oral q morning - 10a Lurena Nida, NP   2.5 mg at 07/04/15 7741    Lab Results:  Results for orders placed or performed during the hospital encounter of 06/28/15 (from the past 48 hour(s))  Glucose, capillary     Status: None   Collection Time: 07/02/15  4:59 PM  Result Value Ref Range   Glucose-Capillary 88 65 - 99 mg/dL  Glucose, capillary     Status: Abnormal   Collection Time: 07/02/15  8:33 PM  Result Value Ref Range   Glucose-Capillary 137 (H) 65 - 99 mg/dL   Comment 1 Notify RN   Glucose, capillary     Status: Abnormal   Collection Time: 07/03/15  6:15 AM  Result Value Ref Range   Glucose-Capillary 106 (H) 65 - 99 mg/dL   Comment 1 Notify RN   Valproic acid level     Status: Abnormal   Collection Time: 07/03/15  6:25 AM  Result Value Ref Range   Valproic Acid Lvl 28 (L) 50.0 - 100.0 ug/mL    Comment: Performed at [facility]  Glucose, capillary     Status: Abnormal   Collection Time: 07/03/15 11:41 AM  Result Value Ref Range   Glucose-Capillary 125 (H) 65 - 99 mg/dL   Comment 1 Notify RN    Comment 2 Document in Chart     Physical Findings: AIMS: Facial and Oral Movements Muscles of Facial Expression: None, normal Lips and Perioral Area: None, normal Jaw: None, normal Tongue: None, normal,Extremity Movements Upper (arms, wrists, hands,  fingers): None, normal Lower (legs, knees, ankles, toes): None, normal, Trunk Movements Neck, shoulders, hips: None, normal, Overall Severity Severity of abnormal movements (highest score from questions above): None, normal Incapacitation due to abnormal movements: None, normal Patient's awareness of abnormal movements (rate only patient's report): No Awareness, Dental Status Current problems with teeth and/or dentures?: Yes Does patient usually wear dentures?: No  CIWA:  CIWA-Ar Total: 3 COWS:  COWS Total Score: 2  Musculoskeletal: Strength & Muscle Tone: within normal limits Gait & Station: normal Patient  leans: N/A  Psychiatric Specialty Exam: Review of Systems  Skin:       Several lesions all over her back and 1-2 on her scalp - likely from itching   Psychiatric/Behavioral: The patient is nervous/anxious.   All other systems reviewed and are negative.   Blood pressure 113/80, pulse 91, temperature 97.5 F (36.4 C), temperature source Oral, resp. rate 18, height 5\' 5"  (1.651 m), weight 94.348 kg (208 lb), SpO2 95 %.Body mass index is 34.61 kg/(m^2).  General Appearance: Disheveled is malodorous   Eye Contact::  Good  Speech:  Pressured  Volume:  Increased  Mood:  Euphoric improving  Affect:  Labile  Thought Process:  Disorganized, Irrelevant and Tangential improving  Orientation:  Other:  to person, place  Thought Content:  Delusions, Paranoid Ideation and Rumination  Suicidal Thoughts:  No  Homicidal Thoughts:  No  Memory:  Immediate;   Fair Recent;   Poor Remote;   Poor  Judgement:  Impaired  Insight:  Shallow  Psychomotor Activity:  Increased  Concentration:  Poor  Recall:  Heard  Language: Fair  Akathisia:  No  Handed:  Right  AIMS (if indicated):     Assets:  Others:  access to health care  ADL's:  Impaired  Cognition: Impaired,  Mild  Sleep:  Number of Hours: 6.25   Treatment Plan Summary: Daily contact with patient to assess and evaluate symptoms and progress in treatment and Medication management  Will continue Depakote DR 500 mg po tid for mood lability. Depakote level on 07/03/15 - 28 ug/ml- subtherapeutic.Marland KitchenAnother depakote level on 07/08/15. Will continue Abilify to  10 mg po daily and 15 mg po qhs for psychosis. Pt prefers to be on Abilify. Has tried Haldol, risperidone in the past. Will continue Lunesta 1 mg po qhs for sleep. Continue Haldol 5 mg po /IM prn , Benadryl 50 mg PO/IM prn ,Ativan 1 mg PO/IM prn for severe anxiety/agitation. Wound consult for lesions on her skin. Will also start Levaquin 500 mg x1 dose today and Levaquin 250  mg x 6 days starting tomorrow. Restart home medications where indicated. Reviewed past medical records,treatment plan.  Per Diabetic consult - Pt with prediabetes - recommends discontinuing SSI as well as CBG monitoring.Will DC both . Will continue to monitor vitals ,medication compliance and treatment side effects while patient is here.  Will monitor for medical issues as well as call consult as needed.  CSW will start working on disposition. CSW to obtain collateral information. Patient to participate in therapeutic milieu .       Sirr Kabel MD 07/04/2015, 1:55 PM

## 2015-07-04 NOTE — Consult Note (Signed)
WOC wound consult note Reason for Consult: Nonintact lesions to bilateral posterior shoulders.  Scattered nonintact lesions to feet, legs, scalp and abdomen.  Patient complains of recent exposure to bed bugs in her home setting.  Wound type:Infectious from scaratching Pressure Ulcer POA: N/A Measurement: Left scapula 3 cm x 2.2 cm x 0.1 cm scabbed lesion Right scapula 2 nonintact lesions 2 cm x 2 cm x 0.1 cm each Wound HCW:CBJSEGB Drainage (amount, consistency, odor) None noted Periwound:Intact Patient has large abdominal pannus and pendulous breasts.  Skin is intact in both of these areas, but patient states they itch and she gets "blackheads" in this area. No rash or nonintact skin noted today.  Dressing procedure/placement/frequency:Cleanse lesions to bilateral shoulders with soap and water.  Apply Allevyn silicone border foam dressing.  Change every 3 days and PRN soilage.  Spoke with MD to consider a systemic antibiotic since these appear to have started from pest infestation and she has scratched them open.  Will not follow at this time.  Please re-consult if needed.  Domenic Moras RN BSN [place] Pager [phone]

## 2015-07-04 NOTE — Progress Notes (Signed)
PT Cancellation Note  Patient Details Name: [name] MRN: [id number] DOB: 03/23/1961   Cancelled Treatment:    Reason Eval/Treat Not Completed: PT screened, no needs identified, will sign off. Spoke with pt's nurse, other nurses on hall and witnessed pt walking up and down hall holding items in her arms and not have mobility issues. No mobility issues noted by staff at this time either. PT to sign off . If pt needs a 3 prone cane ordered for them, please contact [employer] for ordering her one or resources available for equipment for the patient.   Thank you  Clide Dales 07/04/2015, 3:27 PM

## 2015-07-04 NOTE — Progress Notes (Addendum)
Pt continues to be loud on  The unit and stated "I have a  Broken stomach." Pt was assisted with a bath and told the techs that she had bedbugs at the group home and that [facility] was called to get rid of them. 9:40am-Pt does have some open sores in her scalp and on her back . Dr Shea Evans , Charge nurse , Precious Bard and [facility] , Randall Hiss made aware. Infectious disease consult placed. Pt has been attending groups and continues to remain intrusive. She needs redirection and takes prolonged periods of time taking her medications. She goes back in forth if she will take the medications or not. With much encouragement pt did take all her am medications. Pt contracts for safety and denies SI and HI.; Pt was given 1mg  of ativan for agitation.11:20a- Still awaiting infectious disease. Charge nurse made aware. Wound care came to see the pt. A dressing was applied to pt right and left shoulder- Wound care nurse discussed her findings with the MD. 4:30p_Pt is very hyperverbal and continues to be intrusive. Pt was given 1mg  of ativan for extreme agitation.6pm-Charge nurse made aware infectious disease has not been to evaluate the pt. Charge nurse made [facility] aware at 6pm. Dr. Megan Salon from infectious disease phoned back at 6:05pm.

## 2015-07-04 NOTE — Progress Notes (Signed)
D Pt. Continues to be disorganized and tangential.  Pt. Has been intrusive and angry this PM. Stating she wants to call the police on a female peer calling him names . Pt. Denies pain and discomfort. Denies A and VH.  A Writer offers support and encouragement, Pt. Adm. The patients HS medications early in an effort to calm pt.   R Pt. Was redirectable and did eventually settle down.  Pt. Remains safe on the unit.  Pt. Is presently in her room resting quietly.

## 2015-07-04 NOTE — Progress Notes (Signed)
D: [name] continues with disorganized thought processing, flight of ideas, confusion and intermittent incoherent speech. She denies SI. She is very active on the unit and she did attend karaoke off the unit tonight without any disruptive behavior. She can be very manipulative and controlling when it comes to the telephone on the unit as well as watching television with her peers. She is argumentative but not aggressive towards staff or peers.  A: Encouragement and support given.  R: Continue to monitor for safety and medication effectiveness.

## 2015-07-04 NOTE — Care Management Important Message (Signed)
   Per State Regulation 482.30  This chart was reviewed for necessity with respect to the patient's Admission/ Duration of stay.  Next review date: 07/08/15  Skipper Cliche RN, BSN

## 2015-07-04 NOTE — Progress Notes (Signed)
Adult Psychoeducational Group Note  Date:  07/04/2015 Time:  8:34 PM  Group Topic/Focus:  Wrap-Up Group:   The focus of this group is to help patients review their daily goal of treatment and discuss progress on daily workbooks.  Participation Level:  Active  Participation Quality:  Drowsy, Intrusive and Monopolizing  Affect:  Blunted  Cognitive:  Disorganized  Insight: Appropriate  Engagement in Group:  Distracting and Monopolizing  Modes of Intervention:  Discussion  Additional Comments:  Pt's speech was disorganized. When this writer asked her questions about her day, her answers had nothing to do with the question that was asked. Pt was also interruptive and monopolizing. Pt answered the questions that were originally asked of her during other patient's turns.   Lincoln Brigham 07/04/2015, 9:20 PM

## 2015-07-04 NOTE — BHH Group Notes (Signed)
[facility] LCSW Aftercare Discharge Planning Group Note   07/04/2015 1:07 PM  Participation Quality:  Invited. Chose not to attend.   Georga Kaufmann

## 2015-07-05 DIAGNOSIS — L298 Other pruritus: Secondary | ICD-10-CM

## 2015-07-05 LAB — GLUCOSE, CAPILLARY: Glucose-Capillary: 73 mg/dL (ref 65–99)

## 2015-07-05 NOTE — Progress Notes (Signed)
Notified that patient had an unwitnessed fall in her room. Patient evaluated in her room on the 500 hall. She was in no apparent distress as time of assessment. The patient was able to answer all mental status questions correctly. There were no obvious signs of injury noted. Patient currently per charge RN has an order for physical therapy to determine need for assistive device to decrease future falls.

## 2015-07-05 NOTE — Progress Notes (Signed)
Pt has been loud and disorganized on the unit. Pt has refused her VS to be monitored. Pt remained in the during dinner although she wanted to go the cafeteria, pt had some crying moments if her needs were not met. Pt remain alert and oriented, no complains of pain or discomfort.Pt provided with medications per providers orders. Pt reported that her depression was a 0, her hopelessness was a 0, and that her anxiety was a 0. Pt reported being negative SI/HI, no AH/VH noted. A: 15 min checks continued for patient safety. R: Pts safety maintained. . Pt supported emotionally and encouraged to express concerns and questions. Pt educated on medications.

## 2015-07-05 NOTE — Progress Notes (Signed)
Patient ID: [name], female   DOB: 1961/01/19, 54 y.o.   MRN: [id number] [facility] MD Progress Note  07/05/2015 2:28 PM [name]  MRN:  [id number] Subjective: Pt states she is better and ready to leave. States mood is good. Denies depression, SI/HI, AVH. Reports that she is getting married and has stuff to do. States she is a grown woman and can do what ever she wants.   States meds are helping her think clearer and denies SE.  Objective: Patient is a 54 y old CF who has a hx of Bipolar disorder , as well as multiple medical comorbidities , who presented to [facility] by EMS , for nausea after smoking a cigarette. Patient per initial notes in EHR appeared to be disorganized , delusional in the ED.  Patient seen and chart reviewed. Pt today continues to be pressured, its difficult to understand her speech due to her being very pressured and tangential. However she is more organized than on admission. Pt is also labile.  Per staff continues to be intrusive and restless. Pt however slept better last night , which is an improvement for her. Pt found on the floor today facing up. When asked pt was not able to say if she fell. Denies any pain or concerns at this time. Will cotinue to monitor.  Pt will continue to need encouragement and support.   Principal Problem: Bipolar disorder, current episode manic severe with psychotic features [facility]) Diagnosis:   Patient Active Problem List   Diagnosis Date Noted  . Skin lesions [L98.9] 07/04/2015  . Prediabetes [R73.03] 07/03/2015  . Bipolar disorder, current episode manic severe with psychotic features ([place]) [F31.2] 06/30/2015  . History of breast cancer in female [Z85.3] 04/10/2015  . Irreducible ventral hernia [K46.0] 04/12/2013  . HTN (hypertension) [I10]   . COPD (chronic obstructive pulmonary disease) ([place]) [J44.9]   . Chronic kidney disease [N18.9]   . Hyperlipidemia [E78.5]   . Hypothyroidism [E03.9]   . Bipolar affective ([place]) [F31.9]    . Anemia in chronic kidney disease(285.21) [N03.9, D63.1] 07/10/2011  . Thrombocytopenia ([place]) [D69.6] 07/10/2011  . BIPOLAR AFFECTIVE DISORDER [F31.9] 08/15/2006  . SMOKER [F17.200] 08/15/2006  . COPD [J44.9] 08/15/2006  . BOILS, RECURRENT [L02.92, L02.93] 08/15/2006  . ACNE ROSACEA [L71.9] 08/15/2006  . ACNE NEC [L70.8] 08/15/2006  . EDEMA [R60.9] 08/15/2006  . INCONTINENCE, URGE [N39.41] 08/15/2006   Total Time spent with patient: 25 minutes  Past Psychiatric History: Pt with hx of Bipolar disorder , several hospitalizations in the past. Pt unable to provide further details . Will work on obtaining collateral information.   Past Medical History:  Past Medical History  Diagnosis Date  . Anemia 07/10/2011  . Thrombocytopenia ([place]) 07/10/2011  . HTN (hypertension)   . COPD (chronic obstructive pulmonary disease) ([place])   . Hyperlipidemia   . Hypothyroidism   . Bipolar affective ([place])   . DM (diabetes mellitus) ([place])   . Renal insufficiency   . Cancer ([place]) 2012    RIGHT BREAST, RADIATION DONE, NO CHEMO  . Hypertension     Past Surgical History  Procedure Laterality Date  . Ankle surgery Left 1989  . Tonsillectomy  AGE 64  . Breast surgery Right 2010    BREAST  . Ventral hernia repair N/A 06/06/2013    Procedure:  OPEN VENTRAL HERNIA REPAIR;  Surgeon: Imogene Burn. Georgette Dover, MD;  Location: [facility] ORS;  Service: General;  Laterality: N/A;  . Insertion of mesh N/A 06/06/2013  Procedure: INSERTION OF MESH;  Surgeon: Imogene Burn. Georgette Dover, MD;  Location: [facility] ORS;  Service: General;  Laterality: N/A;   Family History:  Family History  Problem Relation Age of Onset  . Diabetes Mother   . Cancer Father   . HIV Brother    Family Psychiatric  History: Pt unable to provide details about family hx . Social History: Pt reports she is divorced and lives with a room mate .  History  Alcohol Use No     History  Drug Use No    Social History   Social History  . Marital Status: Divorced     Spouse Name: N/A  . Number of Children: N/A  . Years of Education: N/A   Social History Main Topics  . Smoking status: Current Every Day Smoker -- 0.50 packs/day for 40 years    Types: Cigarettes, Cigars  . Smokeless tobacco: Never Used  . Alcohol Use: No  . Drug Use: No  . Sexual Activity: Not Asked   Other Topics Concern  . None   Social History Narrative   Additional Social History:    History of alcohol / drug use?: No history of alcohol / drug abuse (unable to assess)                    Sleep: Fair-  Appetite:  Fair  Current Medications: Current Facility-Administered Medications  Medication Dose Route Frequency Provider Last Rate Last Dose  . acetaminophen (TYLENOL) tablet 650 mg  650 mg Oral Q4H PRN Lurena Nida, NP   650 mg at 07/03/15 1724  . albuterol (PROVENTIL HFA;VENTOLIN HFA) 108 (90 BASE) MCG/ACT inhaler 1-2 puff  1-2 puff Inhalation Q6H PRN Lurena Nida, NP   2 puff at 07/01/15 1924  . alum & mag hydroxide-simeth (MAALOX/MYLANTA) 200-200-20 MG/5ML suspension 30 mL  30 mL Oral PRN Lurena Nida, NP      . ARIPiprazole (ABILIFY) tablet 10 mg  10 mg Oral Daily Ursula Alert, MD   10 mg at 07/05/15 0801  . ARIPiprazole (ABILIFY) tablet 15 mg  15 mg Oral QHS Ursula Alert, MD   15 mg at 07/04/15 2051  . atorvastatin (LIPITOR) tablet 40 mg  40 mg Oral QHS Lurena Nida, NP   40 mg at 07/04/15 2050  . benztropine (COGENTIN) tablet 0.5 mg  0.5 mg Oral Daily Ursula Alert, MD   0.5 mg at 07/05/15 0802  . carvedilol (COREG) tablet 3.125 mg  3.125 mg Oral q morning - 10a Lurena Nida, NP   3.125 mg at 07/05/15 1042  . chlorpheniramine-HYDROcodone (TUSSIONEX) 10-8 MG/5ML suspension 5 mL  5 mL Oral Q12H PRN Lurena Nida, NP   5 mL at 07/05/15 0234  . diphenhydrAMINE (BENADRYL) capsule 50 mg  50 mg Oral Q6H PRN Ursula Alert, MD       Or  . diphenhydrAMINE (BENADRYL) injection 50 mg  50 mg Intramuscular Q6H PRN Saramma Eappen, MD      . divalproex  (DEPAKOTE) DR tablet 500 mg  500 mg Oral [id number] Saramma Eappen, MD   500 mg at 07/05/15 0801  . eszopiclone (LUNESTA) tablet 1 mg  1 mg Oral QHS Ursula Alert, MD   1 mg at 07/04/15 2051  . feeding supplement (GLUCERNA SHAKE) (GLUCERNA SHAKE) liquid 237 mL  237 mL Oral TID BM Saramma Eappen, MD   237 mL at 07/05/15 1043  . gabapentin (NEURONTIN) capsule 200 mg  200 mg Oral [id number] Saramma  Eappen, MD   200 mg at 07/05/15 0801  . haloperidol (HALDOL) tablet 5 mg  5 mg Oral Q6H PRN Ursula Alert, MD   5 mg at 07/03/15 0831   Or  . haloperidol lactate (HALDOL) injection 5 mg  5 mg Intramuscular Q6H PRN Ursula Alert, MD      . hydrocortisone cream 1 %   Topical TID Ursula Alert, MD      . hydrOXYzine (ATARAX/VISTARIL) tablet 25 mg  25 mg Oral TID Ursula Alert, MD   25 mg at 07/05/15 1304  . levothyroxine (SYNTHROID, LEVOTHROID) tablet 75 mcg  75 mcg Oral QAC breakfast Lurena Nida, NP   75 mcg at 07/05/15 0706  . LORazepam (ATIVAN) tablet 1 mg  1 mg Oral Q8H PRN Ursula Alert, MD   1 mg at 07/05/15 0234   Or  . LORazepam (ATIVAN) injection 1 mg  1 mg Intramuscular Q8H PRN Ursula Alert, MD      . mometasone-formoterol (DULERA) 100-5 MCG/ACT inhaler 2 puff  2 puff Inhalation BID Lurena Nida, NP   2 puff at 07/05/15 0802  . multivitamin with minerals tablet 1 tablet  1 tablet Oral Daily Ursula Alert, MD   1 tablet at 07/05/15 0801  . neomycin-bacitracin-polymyxin (NEOSPORIN) ointment   Topical BID Saramma Eappen, MD      . nicotine (NICODERM CQ - dosed in mg/24 hours) patch 21 mg  21 mg Transdermal Daily Benjamine Mola, FNP   21 mg at 07/04/15 0817  . ramipril (ALTACE) capsule 2.5 mg  2.5 mg Oral q morning - 10a Lurena Nida, NP   2.5 mg at 07/05/15 1043    Lab Results:  Results for orders placed or performed during the hospital encounter of 06/28/15 (from the past 48 hour(s))  Glucose, capillary     Status: None   Collection Time: 07/05/15  6:27 AM  Result Value Ref Range    Glucose-Capillary 73 65 - 99 mg/dL    Physical Findings: AIMS: Facial and Oral Movements Muscles of Facial Expression: None, normal Lips and Perioral Area: None, normal Jaw: None, normal Tongue: None, normal,Extremity Movements Upper (arms, wrists, hands, fingers): None, normal Lower (legs, knees, ankles, toes): None, normal, Trunk Movements Neck, shoulders, hips: None, normal, Overall Severity Severity of abnormal movements (highest score from questions above): None, normal Incapacitation due to abnormal movements: None, normal Patient's awareness of abnormal movements (rate only patient's report): No Awareness, Dental Status Current problems with teeth and/or dentures?: No Does patient usually wear dentures?: No  CIWA:  CIWA-Ar Total: 3 COWS:  COWS Total Score: 2  Musculoskeletal: Strength & Muscle Tone: within normal limits Gait & Station: normal Patient leans: N/A  Psychiatric Specialty Exam: Review of Systems  Skin:       Several lesions all over her back and 1-2 on her scalp - likely from itching   Psychiatric/Behavioral: Negative for suicidal ideas. The patient is nervous/anxious. The patient does not have insomnia.   All other systems reviewed and are negative.   Blood pressure 111/75, pulse 76, temperature 97.5 F (36.4 C), temperature source Oral, resp. rate 18, height 5\' 5"  (1.651 m), weight 94.348 kg (208 lb), SpO2 95 %.Body mass index is 34.61 kg/(m^2).  General Appearance: Disheveled is malodorous   Eye Contact::  Good  Speech:  Pressured  Volume:  Increased  Mood:  Euphoric improving  Affect:  Labile  Thought Process:  Disorganized, Irrelevant and Tangential improving  Orientation:  Other:  to person,  place  Thought Content:  Delusions, Paranoid Ideation and Rumination  Suicidal Thoughts:  No  Homicidal Thoughts:  No  Memory:  Immediate;   Fair Recent;   Poor Remote;   Poor  Judgement:  Impaired  Insight:  Shallow  Psychomotor Activity:  Increased   Concentration:  Poor  Recall:  [place]  Language: Fair  Akathisia:  No  Handed:  Right  AIMS (if indicated):     Assets:  Others:  access to health care  ADL's:  Impaired  Cognition: Impaired,  Mild  Sleep:  Number of Hours: 6.25   Treatment Plan Summary: Daily contact with patient to assess and evaluate symptoms and progress in treatment and Medication management  Will continue Depakote DR 500 mg po tid for mood lability. Depakote level on 07/03/15 - 28 ug/ml- subtherapeutic.Marland KitchenAnother depakote level on 07/08/15. Will continue Abilify to  10 mg po daily and 15 mg po qhs for psychosis. Pt prefers to be on Abilify. Has tried Haldol, risperidone in the past. Will continue Lunesta 1 mg po qhs for sleep. Continue Haldol 5 mg po /IM prn , Benadryl 50 mg PO/IM prn ,Ativan 1 mg PO/IM prn for severe anxiety/agitation. Wound consult for lesions on her skin. Will also start Levaquin 500 mg x1 dose today and Levaquin 250 mg x 6 days starting tomorrow. Restart home medications where indicated. Reviewed past medical records,treatment plan.   Per Diabetic consult - Pt with prediabetes - recommends discontinuing SSI as well as CBG monitoring.Will DC both . Will continue to monitor vitals ,medication compliance and treatment side effects while patient is here.  Will monitor for medical issues as well as call consult as needed.  CSW will start working on disposition. CSW to obtain collateral information. Patient to participate in therapeutic milieu .       Charlcie Cradle MD 07/05/2015, 2:28 PM

## 2015-07-05 NOTE — Consult Note (Signed)
[facility] for Infectious Disease    Date of Admission:  06/28/2015           Day 2 levofloxacin       Reason for Consult: Skin lesions    Referring Physician: Dr. Ursula Alert  Principal Problem:   Skin lesions Active Problems:   Bipolar disorder, current episode manic severe with psychotic features ([place])   Prediabetes   . ARIPiprazole  10 mg Oral Daily  . ARIPiprazole  15 mg Oral QHS  . atorvastatin  40 mg Oral QHS  . benztropine  0.5 mg Oral Daily  . carvedilol  3.125 mg Oral q morning - 10a  . divalproex  500 mg Oral BH-q8a3phs  . eszopiclone  1 mg Oral QHS  . feeding supplement (GLUCERNA SHAKE)  237 mL Oral TID BM  . gabapentin  200 mg Oral BH-q8a3phs  . hydrocortisone cream   Topical TID  . hydrOXYzine  25 mg Oral TID  . levofloxacin  250 mg Oral Daily  . levothyroxine  75 mcg Oral QAC breakfast  . mometasone-formoterol  2 puff Inhalation BID  . multivitamin with minerals  1 tablet Oral Daily  . neomycin-bacitracin-polymyxin   Topical BID  . nicotine  21 mg Transdermal Daily  . ramipril  2.5 mg Oral q morning - 10a    Recommendations: 1. Discontinue levofloxacin 2. 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 I do not see any evidence of infection. The distribution does not suggest bedbugs. I would focus treatment on moisturizing creams and antihistamines as needed/tolerated. I will sign off now.    HPI: [name] is a 54 y.o. female who was hospitalized on 06/30/2015 with delusional thinking. I was called yesterday to evaluate her rash. She tells me that she has diffuse itching. It sounds like this is quite chronic.   Review of Systems: Review of systems not obtained due to patient factors.  Past Medical History  Diagnosis Date  . Anemia 07/10/2011  . Thrombocytopenia ([place]) 07/10/2011  . HTN (hypertension)   . COPD (chronic obstructive pulmonary disease) ([place])   .  Hyperlipidemia   . Hypothyroidism   . Bipolar affective ([place])   . DM (diabetes mellitus) ([place])   . Renal insufficiency   . Cancer ([place]) 2012    RIGHT BREAST, RADIATION DONE, NO CHEMO  . Hypertension     Social History  Substance Use Topics  . Smoking status: Current Every Day Smoker -- 0.50 packs/day for 40 years    Types: Cigarettes, Cigars  . Smokeless tobacco: Never Used  . Alcohol Use: No    Family History  Problem Relation Age of Onset  . Diabetes Mother   . Cancer Father   . HIV Brother    Allergies  Allergen Reactions  . Doxycycline Other (See Comments)    REACTION: Unknown reaction  . Glipizide Other (See Comments)    unknown  . Ibuprofen Other (See Comments)    CANNOT TAKE IBUPROFEN OR MOTRIN DUE TO CURRENT MEDS  . Penicillins Other (See Comments)    unknown  . Sulfonamide Derivatives     REACTION: Unknown reaction  . Tetracycline Other (See Comments)    REACTION: Unknown reaction  . Tolterodine Tartrate Itching  . Chlorostat [Chlorhexidine] Rash  . Oxybutynin Chloride Itching and Rash    OBJECTIVE: Blood pressure 111/75, pulse 76, temperature 97.5 F (36.4 C), temperature source Oral, resp. rate 18,  height 5\' 5"  (1.651 m), weight 208 lb (94.348 kg), SpO2 95 %. General: she is disheveled and sitting on the side of her bed eating lunch. She is disorganized rambling speech. She wants me to write her a note so she can have a chocolate candy bar. Skin: a few scattered excoriated skin lesions on her upper back bilaterally, right lower back and scalp. None have the appearance of superinfection  Lab Results Lab Results  Component Value Date   WBC 6.2 06/28/2015   HGB 11.7* 06/28/2015   HCT 35.5* 06/28/2015   MCV 101.7* 06/28/2015   PLT 175 06/28/2015    Lab Results  Component Value Date   CREATININE 2.28* 06/28/2015   BUN 37* 06/28/2015   NA 143 06/28/2015   K 4.2 06/28/2015   CL 109 06/28/2015   CO2 28 06/28/2015    Lab Results  Component Value  Date   ALT 32 06/28/2015   AST 48* 06/28/2015   ALKPHOS 77 06/28/2015   BILITOT 0.5 06/28/2015     Microbiology: No results found for this or any previous visit (from the past 240 hour(s)).  Michel Bickers, MD [facility] [place] [facility] [phone] pager   [phone] cell 07/05/2015, 2:02 PM

## 2015-07-05 NOTE — B[facility] Group Notes (Signed)
[facility] Group Notes:  (Clinical Social Work)  07/05/2015  11:15-12:00PM  Summary of Progress/Problems:   Today's process group involved patients discussing their feelings related to being hospitalized, as well as how they want to feel in order to be ready to discharge.  It was agreed ithat it would be preferable to avoid future hospitalizations, and there was a discussion about what each person will need to do to achieve that.  Ways to maximize the things we do like about the hospital by incorporating those into our lives were discussed.   The patient engaged but kept dozing off.  Type of Therapy:  Group Therapy - Process  Participation Level:  Active  Participation Quality:  Sharing and Drowsy  Affect:  Blunted  Cognitive:  Disorganized  Insight:  Limited  Engagement in Therapy:  Limited  Modes of Intervention:  Exploration, Discussion  Selmer Dominion, LCSW 07/05/2015, 12:49 PM

## 2015-07-05 NOTE — B[facility] Group Notes (Signed)
[facility] Group Notes:  (Nursing/MHT/Case Management/Adjunct)  Date:  07/05/2015  Time:  12:17 PM Type of Therapy:  Psychoeducational Skills  Participation Level:  Active  Participation Quality:  Appropriate  Affect:  Appropriate  Cognitive:  Appropriate  Insight:  Appropriate  Engagement in Group:  Engaged  Modes of Intervention:  Problem-solving  Summary of Progress/Problems: Pt attended healthy coping skills, ppt did participated but was disorganized.  [name] 07/05/2015, 12:17 PM

## 2015-07-05 NOTE — Progress Notes (Signed)
Pt was found on the floor at 1300 with her face facing up, pt was unable to explained how she got on the floor when asked. Pt denied experiencing any physical pain, denied headache, no hip pain nor back pain. No injuries observed. Pt was able to raise her legs and arms without difficulties. Pt refused vital sign or CBG to be done. Dr Doyne Keel notified at 1315. Pt is instructed to get up slowly and call for help with getting dressed. Pt is currently lying on bed resting. We will continue to monitor.

## 2015-07-06 LAB — GLUCOSE, CAPILLARY: Glucose-Capillary: 132 mg/dL — ABNORMAL HIGH (ref 65–99)

## 2015-07-06 MED ORDER — ARIPIPRAZOLE 15 MG PO TABS
15.0000 mg | ORAL_TABLET | Freq: Two times a day (BID) | ORAL | Status: DC
  Administered 2015-07-06 – 2015-07-08 (×3): 15 mg via ORAL
  Filled 2015-07-06 (×10): qty 1

## 2015-07-06 NOTE — [facility] Group Notes (Signed)
[facility] Group Notes:  (Nursing/MHT/Case Management/Adjunct)  Date:  07/06/2015  Time:  10:39 AM  Type of Therapy:  Psychoeducational Skills  Participation Level:  Active  Participation Quality:  Intrusive, Inattentive and Monopolizing  Affect:  Anxious  Cognitive:  Disorganized  Insight:  None  Engagement in Group:  Distracting, Monopolizing and Off Topic  Modes of Intervention:  Discussion and Education  Summary of Progress/Problems: Patient came to group but was off topic most of the time. Speech is pressured and patient is tangential. She reports her support system is her family and a lady named Kendra Opitz. Her and another patient were instigating each other and making her intrusions worse.    Gaylan Gerold E 07/06/2015, 10:39 AM

## 2015-07-06 NOTE — Progress Notes (Signed)
[facility] MD Progress Note  07/06/2015 1:10 PM [name]  MRN:  [id number] Subjective: Pt states she is better and ready to leave. States mood is good and is happy. Denies depression, SI/HI, AVH. Pt talked about a number of things but thoughts remain disorganized and it was difficult to understand her. States sleep and energy are great. States she is ready for discharge and that we can't keep her here. States if she is kept in the hospital any longer she is going to sue every one.   States meds are helping her think clearer and denies SE.  Objective: Patient is a 54 y old CF who has a hx of Bipolar disorder , as well as multiple medical comorbidities , who presented to [facility] by EMS , for nausea after smoking a cigarette. Patient per initial notes in EHR appeared to be disorganized , delusional in the ED.  Patient seen and chart reviewed. Pt went to the cafeteria for lunch and tolerated it well.  Pt today continues to be pressured, its difficult to understand her speech due to her being very pressured , disorganized and tangential. However she is more organized as compared to yesterday. Pt is also labile.  Per staff continues to be intrusive and restless. Pt slept better last night  Pt will continue to need encouragement and support.   Principal Problem: Bipolar disorder, current episode manic severe with psychotic features [place]) Diagnosis:   Patient Active Problem List   Diagnosis Date Noted  . Skin lesions [L98.9] 07/04/2015  . Prediabetes [R73.03] 07/03/2015  . Bipolar disorder, current episode manic severe with psychotic features ([place]) [F31.2] 06/30/2015  . History of breast cancer in female [Z85.3] 04/10/2015  . Irreducible ventral hernia [K46.0] 04/12/2013  . HTN (hypertension) [I10]   . COPD (chronic obstructive pulmonary disease) ([place]) [J44.9]   . Chronic kidney disease [N18.9]   . Hyperlipidemia [E78.5]   . Hypothyroidism [E03.9]   . Bipolar affective ([place]) [F31.9]   .  Anemia in chronic kidney disease(285.21) [N03.9, D63.1] 07/10/2011  . Thrombocytopenia ([place]) [D69.6] 07/10/2011  . BIPOLAR AFFECTIVE DISORDER [F31.9] 08/15/2006  . SMOKER [F17.200] 08/15/2006  . COPD [J44.9] 08/15/2006  . BOILS, RECURRENT [L02.92, L02.93] 08/15/2006  . ACNE ROSACEA [L71.9] 08/15/2006  . ACNE NEC [L70.8] 08/15/2006  . EDEMA [R60.9] 08/15/2006  . INCONTINENCE, URGE [N39.41] 08/15/2006   Total Time spent with patient: 25 minutes  Past Psychiatric History: Pt with hx of Bipolar disorder , several hospitalizations in the past. Pt unable to provide further details . Will work on obtaining collateral information.   Past Medical History:  Past Medical History  Diagnosis Date  . Anemia 07/10/2011  . Thrombocytopenia ([place]) 07/10/2011  . HTN (hypertension)   . COPD (chronic obstructive pulmonary disease) ([place])   . Hyperlipidemia   . Hypothyroidism   . Bipolar affective ([place])   . DM (diabetes mellitus) ([place])   . Renal insufficiency   . 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 Hypertension     Past Surgical History  Procedure Laterality Date  . Ankle surgery Left 1989  . Tonsillectomy  AGE 3  . Breast surgery Right 2010    BREAST  . Ventral hernia repair N/A 06/06/2013    Procedure:  OPEN VENTRAL HERNIA REPAIR;  Surgeon: Imogene Burn. Georgette Dover, MD;  Location: [facility] ORS;  Service: General;  Laterality: N/A;  . Insertion of mesh N/A 06/06/2013    Procedure: INSERTION OF MESH;  Surgeon: Rodman Key  Oren Section, MD;  Location: [facility] ORS;  Service: General;  Laterality: N/A;   Family History:  Family History  Problem Relation Age of Onset  . Diabetes Mother   . Cancer Father   . HIV Brother    Family Psychiatric  History: Pt unable to provide details about family hx . Social History: Pt reports she is divorced and lives with a room mate .  History  Alcohol Use No     History  Drug Use No    Social History   Social History  . Marital Status: Divorced     Spouse Name: N/A  . Number of Children: N/A  . Years of Education: N/A   Social History Main Topics  . Smoking status: Current Every Day Smoker -- 0.50 packs/day for 40 years    Types: Cigarettes, Cigars  . Smokeless tobacco: Never Used  . Alcohol Use: No  . Drug Use: No  . Sexual Activity: Not Asked   Other Topics Concern  . None   Social History Narrative   Additional Social History:    History of alcohol / drug use?: No history of alcohol / drug abuse (unable to assess)                    Sleep: Fair-  Appetite:  Fair  Current Medications: Current Facility-Administered Medications  Medication Dose Route Frequency Provider Last Rate Last Dose  . acetaminophen (TYLENOL) tablet 650 mg  650 mg Oral Q4H PRN Lurena Nida, NP   650 mg at 07/03/15 1724  . albuterol (PROVENTIL HFA;VENTOLIN HFA) 108 (90 BASE) MCG/ACT inhaler 1-2 puff  1-2 puff Inhalation Q6H PRN Lurena Nida, NP   2 puff at 07/01/15 1924  . alum & mag hydroxide-simeth (MAALOX/MYLANTA) 200-200-20 MG/5ML suspension 30 mL  30 mL Oral PRN Lurena Nida, NP      . ARIPiprazole (ABILIFY) tablet 10 mg  10 mg Oral Daily Ursula Alert, MD   10 mg at 07/06/15 0754  . ARIPiprazole (ABILIFY) tablet 15 mg  15 mg Oral QHS Ursula Alert, MD   15 mg at 07/05/15 2209  . atorvastatin (LIPITOR) tablet 40 mg  40 mg Oral QHS Lurena Nida, NP   40 mg at 07/05/15 2209  . benztropine (COGENTIN) tablet 0.5 mg  0.5 mg Oral Daily Ursula Alert, MD   0.5 mg at 07/06/15 0754  . carvedilol (COREG) tablet 3.125 mg  3.125 mg Oral q morning - 10a Lurena Nida, NP   3.125 mg at 07/06/15 1027  . chlorpheniramine-HYDROcodone (TUSSIONEX) 10-8 MG/5ML suspension 5 mL  5 mL Oral Q12H PRN Lurena Nida, NP   5 mL at 07/05/15 0234  . diphenhydrAMINE (BENADRYL) capsule 50 mg  50 mg Oral Q6H PRN Ursula Alert, MD       Or  . diphenhydrAMINE (BENADRYL) injection 50 mg  50 mg Intramuscular Q6H PRN Saramma Eappen, MD      . divalproex (DEPAKOTE)  DR tablet 500 mg  500 mg Oral [id number] Ursula Alert, MD   500 mg at 07/06/15 0755  . eszopiclone (LUNESTA) tablet 1 mg  1 mg Oral QHS Ursula Alert, MD   1 mg at 07/05/15 2209  . feeding supplement (GLUCERNA SHAKE) (GLUCERNA SHAKE) liquid 237 mL  237 mL Oral TID BM Ursula Alert, MD   237 mL at 07/06/15 0807  . gabapentin (NEURONTIN) capsule 200 mg  200 mg Oral [id number] Saramma Eappen, MD   200 mg at  07/06/15 0755  . haloperidol (HALDOL) tablet 5 mg  5 mg Oral Q6H PRN Ursula Alert, MD   5 mg at 07/03/15 0831   Or  . haloperidol lactate (HALDOL) injection 5 mg  5 mg Intramuscular Q6H PRN Ursula Alert, MD      . hydrocortisone cream 1 %   Topical TID Ursula Alert, MD      . hydrOXYzine (ATARAX/VISTARIL) tablet 25 mg  25 mg Oral TID Ursula Alert, MD   25 mg at 07/06/15 1301  . levothyroxine (SYNTHROID, LEVOTHROID) tablet 75 mcg  75 mcg Oral QAC breakfast Lurena Nida, NP   75 mcg at 07/06/15 0704  . LORazepam (ATIVAN) tablet 1 mg  1 mg Oral Q8H PRN Ursula Alert, MD   1 mg at 07/05/15 0234   Or  . LORazepam (ATIVAN) injection 1 mg  1 mg Intramuscular Q8H PRN Ursula Alert, MD      . mometasone-formoterol (DULERA) 100-5 MCG/ACT inhaler 2 puff  2 puff Inhalation BID Lurena Nida, NP   2 puff at 07/06/15 0756  . multivitamin with minerals tablet 1 tablet  1 tablet Oral Daily Ursula Alert, MD   1 tablet at 07/06/15 0754  . neomycin-bacitracin-polymyxin (NEOSPORIN) ointment   Topical BID Saramma Eappen, MD      . nicotine (NICODERM CQ - dosed in mg/24 hours) patch 21 mg  21 mg Transdermal Daily Benjamine Mola, FNP   21 mg at 07/04/15 0817  . ramipril (ALTACE) capsule 2.5 mg  2.5 mg Oral q morning - 10a Lurena Nida, NP   2.5 mg at 07/06/15 1026    Lab Results:  Results for orders placed or performed during the hospital encounter of 06/28/15 (from the past 48 hour(s))  Glucose, capillary     Status: None   Collection Time: 07/05/15  6:27 AM  Result Value Ref Range    Glucose-Capillary 73 65 - 99 mg/dL  Glucose, capillary     Status: Abnormal   Collection Time: 07/06/15  6:53 AM  Result Value Ref Range   Glucose-Capillary 132 (H) 65 - 99 mg/dL   Comment 1 Notify RN     Physical Findings: AIMS: Facial and Oral Movements Muscles of Facial Expression: None, normal Lips and Perioral Area: None, normal Jaw: None, normal Tongue: None, normal,Extremity Movements Upper (arms, wrists, hands, fingers): None, normal Lower (legs, knees, ankles, toes): None, normal, Trunk Movements Neck, shoulders, hips: None, normal, Overall Severity Severity of abnormal movements (highest score from questions above): None, normal Incapacitation due to abnormal movements: None, normal Patient's awareness of abnormal movements (rate only patient's report): No Awareness, Dental Status Current problems with teeth and/or dentures?: No Does patient usually wear dentures?: No  CIWA:  CIWA-Ar Total: 3 COWS:  COWS Total Score: 2  Musculoskeletal: Strength & Muscle Tone: within normal limits Gait & Station: normal Patient leans: N/A  Psychiatric Specialty Exam: Review of Systems  Musculoskeletal: Positive for back pain.  Skin:       Several lesions all over her back and 1-2 on her scalp - likely from itching   Neurological: Positive for headaches. Negative for seizures and loss of consciousness.  Psychiatric/Behavioral: Negative for depression and suicidal ideas. The patient is nervous/anxious. The patient does not have insomnia.   All other systems reviewed and are negative.   Blood pressure 120/80, pulse 79, temperature 97.5 F (36.4 C), temperature source Oral, resp. rate 16, height 5\' 5"  (1.651 m), weight 94.348 kg (208 lb), SpO2 95 %.  Body mass index is 34.61 kg/(m^2).  General Appearance: improving slowly  Eye Contact::  Good  Speech:  Pressured  Volume:  Increased  Mood:  Euphoric improving  Affect:  Labile  Thought Process:  Disorganized, Irrelevant and Tangential  mild improvment  Orientation:  Other:  to person, place  Thought Content:  Delusions, Paranoid Ideation and Rumination  Suicidal Thoughts:  No  Homicidal Thoughts:  No  Memory:  Immediate;   Fair Recent;   Poor Remote;   Poor  Judgement:  Impaired  Insight:  Lacking  Psychomotor Activity:  Increased  Concentration:  Poor  Recall:  [place] of Knowledge:Fair  Language: Fair  Akathisia:  No  Handed:  Right  AIMS (if indicated):     Assets:  Others:  access to health care  ADL's:  Impaired  Cognition: Impaired,  Mild  Sleep:  Number of Hours: 2.5   Treatment Plan Summary: Daily contact with patient to assess and evaluate symptoms and progress in treatment and Medication management  Will continue Depakote DR 500 mg po tid for mood lability. Depakote level on 07/03/15 - 28 ug/ml- subtherapeutic.Marland KitchenAnother depakote level on 07/08/15. Will increase Abilify to 15 mg po daily and 15 mg po qhs for psychosis. Pt prefers to be on Abilify. Has tried Haldol, risperidone in the past. Will continue Lunesta 1 mg po qhs for sleep. Continue Haldol 5 mg po /IM prn , Benadryl 50 mg PO/IM prn ,Ativan 1 mg PO/IM prn for severe anxiety/agitation. Wound consult for lesions on her skin. Will also start Levaquin 500 mg x1 dose today and Levaquin 250 mg x 6 days starting tomorrow. Restart home medications where indicated. Reviewed past medical records,treatment plan.   Per Diabetic consult - Pt with prediabetes - recommends discontinuing SSI as well as CBG monitoring.Will DC both . Will continue to monitor vitals ,medication compliance and treatment side effects while patient is here.  Will monitor for medical issues as well as call consult as needed.  CSW will start working on disposition. CSW to obtain collateral information. Patient to participate in therapeutic milieu .       Charlcie Cradle MD 07/06/2015, 1:10 PM

## 2015-07-06 NOTE — B[facility] Group Notes (Signed)
[place] Group Notes:  (Clinical Social Work)  07/06/2015  [place] Group Notes:  (Clinical Social Work)  07/06/2015  11:00AM-12:00PM  Summary of Progress/Problems:  The main focus of today's process group was to listen to a variety of genres of music and to identify that different types of music provoke different responses.  The patient then was able to identify personally what was soothing for them, as well as energizing.   The patient expressed understanding of concepts, as well as knowledge of how each type of music affected her and how this can be used at home as a wellness/recovery tool.  She danced and sang quite a bit.  Type of Therapy:  Music Therapy   Participation Level:  Active  Participation Quality:  Attentive and Sharing  Affect:  Blunted  Cognitive:  Disorganized  Insight:  Improving  Engagement in Therapy:  Engaged  Modes of Intervention:   Activity, Exploration  Selmer Dominion, LCSW 07/06/2015

## 2015-07-06 NOTE — Progress Notes (Signed)
D: Patient is alert. Patient denies pain/SI/HI/AVH. Patient is constantly removing clothing while in her room. Taking her pants off and trying to put on other pants.  A: Staff to monitor Q 15 mins for safety. Encouragement and support offered. Scheduled medications administered per orders. R: Patient remains safe on the unit. Patient attended group tonight. Patient visible on hte unit and interacting with peers. Patient taking administered medications.

## 2015-07-06 NOTE — Progress Notes (Signed)
[place] Group Notes:  (Nursing/MHT/Case Management/Adjunct)  Date:  07/06/2015  Time:  11:11 PM  Type of Therapy:  Psychoeducational Skills  Participation Level:  Active  Participation Quality:  Monopolizing and Redirectable  Affect:  Excited  Cognitive:  Disorganized  Insight:  Lacking  Engagement in Group:  Lacking and Off Topic  Modes of Intervention:  Education  Summary of Progress/Problems: The patient was unable to talk about her day. She verbalized that she was concerned about not having received her mail on the unit. She then changed her mind and said that she did receive her mail. The patient mentioned something with regards to not having had any visitors and that this bothered her. In terms of the theme for the day, her support system will be comprised of Ms. [name], her brother, and her peers in the hospital.   Gennette Pac 07/06/2015, 11:11 PM

## 2015-07-06 NOTE — Progress Notes (Signed)
D.Pt has been present in the dayroom most of the day. Pt attended group, pt has been cooperative with her medication. Pt complained of left leg toe being pain, ice pack offered and pt reported relief.Per self inventory, pt rates depression at a 0, hopelessness 0 and anxiety 0. Pt's daily goal is to " get a job, school at [employer], site) and get married with children."  Pt reports a good sleep, a good appetite, low energy and good concentration. Pt's safety ensured with 15 minute and environmental checks. Pt currently denies SI/HI and A/V hallucinations. Pt verbally agrees to seek staff if SI/HI or A/VH occurs and to consult with staff before acting on these thoughts. Will continue POC.

## 2015-07-06 NOTE — Progress Notes (Signed)
Patient fell in floor while standing looking up watching T.V. on patient's butt. This Probation officer assessed patient, patient denies pain, ROM within normal limit. Vital obtained T-97.5 P-80 R-16 BP-149/66.  Provider, charge nurse, [facility] notified

## 2015-07-06 NOTE — Progress Notes (Signed)
   07/06/15 6606  What Happened  Was fall witnessed? Yes  Who witnessed fall? nurse (Nurse)  Patients activity before fall ambulating-unassisted;other (comment) (standing watching TV looking up)  Point of contact buttocks  Was patient injured? No  Follow Up  MD notified yes  Time MD notified [phone]  Family notified No- patient refusal  Additional tests No  Adult Fall Risk Assessment  Patient's Fall Risk High Fall Risk (>13 points)  Adult Fall Risk Interventions  Required Bundle Interventions *See Row Information* High fall risk - low, moderate, and high requirements implemented  Additional Interventions Assess orthostatic BP  Fall with Injury Screening  Risk For Fall Injury- See Row Information  Nurse judgement  Vitals  Temp 97.5 F (36.4 C)  Temp Source Oral  BP (!) 149/66 mmHg  BP Location Left Arm  BP Method Automatic  Patient Position (if appropriate) Sitting  Pulse Rate 80  Pulse Rate Source [place]  Resp 16

## 2015-07-07 NOTE — Progress Notes (Signed)
Patient up on unit talking with staff.  Tangential speech unable to stay on one topic for long.  C/o pain left great toe. Area   with redness noted at nail bed and between the toes.  Patriciaann Clan, PA in to see patient.  Orders received and initiated.  Patient also encouraged to keep foot elevated.  Ice pack given to help pain with tylenol gr x.  Patient refused to take Lunesta and Neurontin.  Pt states allergy to med but, has taken these meds for the last 2 nights.  Meds are not on patients allergy list.  Discharge plans in process.  Wil continue to monitor for safety.

## 2015-07-07 NOTE — [facility] Group Notes (Signed)
[place] Group Notes:  (Counselor/Nursing/MHT/Case Management/Adjunct)  07/07/2015 1:15PM  Type of Therapy:  Group Therapy  Participation Level:  Active  Participation Quality:  Appropriate  Affect:  Flat  Cognitive:  Oriented  Insight:  Improving  Engagement in Group:  Limited  Engagement in Therapy:  Limited  Modes of Intervention:  Discussion, Exploration and Socialization  Summary of Progress/Problems: The topic for group was balance in life.  Pt participated in the discussion about when their life was in balance and out of balance and how this feels.  Pt discussed ways to get back in balance and short term goals they can work on to get where they want to be. Stayed the entire time.  Put up her hand up to be called on.  Continues to ramble, but is more goal directed and generally subject related.  Became tearful when talking about the multiple losses she has had in her life.  Cited her group home and day program as good supports.   [name] 07/07/2015 4:05 PM

## 2015-07-07 NOTE — Progress Notes (Signed)
D: Patient alert and oriented x 4. Patient denies pain/SI/HI/AVH. Patient took medications with no issues, Patient continues to change clothing often in room.  A: Staff to monitor Q 15 mins for safety. Encouragement and support offered. Scheduled medications administered per orders. R: Patient remains safe on the unit. Patient attended group tonight. Patient visible on the unit and interacting with peers. Patient taking administered medications.

## 2015-07-07 NOTE — Progress Notes (Signed)
Patient ID: [name], female   DOB: 09-08-1960, 54 y.o.   MRN: [id number] [facility] MD Progress Note  07/07/2015 3:30 PM KIMYAH FREIN  MRN:  [id number] Subjective: patient states she feels better . Denies medication side effects.   Objective: I have discussed case with treatment team and have met with patient. As discussed with staff patient remains disorganized, pressured in speech, and tends to become tangential, but is much improved compared to admission status . She is visible in day room, and responsive to support and redirections from staff. No grossly disruptive or agitated behaviors. As discussed with CSW, patient is a group home resident, and plans to return there after discharge. At present patient denies medication side effects.    Principal Problem: Bipolar disorder, current episode manic severe with psychotic features [facility]) Diagnosis:   Patient Active Problem List   Diagnosis Date Noted  . Skin lesions [L98.9] 07/04/2015  . Prediabetes [R73.03] 07/03/2015  . Bipolar disorder, current episode manic severe with psychotic features ([employer]) [F31.2] 06/30/2015  . History of breast cancer in female [Z85.3] 04/10/2015  . Irreducible ventral hernia [K46.0] 04/12/2013  . HTN (hypertension) [I10]   . COPD (chronic obstructive pulmonary disease) ([place]) [J44.9]   . Chronic kidney disease [N18.9]   . Hyperlipidemia [E78.5]   . Hypothyroidism [E03.9]   . Bipolar affective ([place]) [F31.9]   . Anemia in chronic kidney disease(285.21) [N03.9, D63.1] 07/10/2011  . Thrombocytopenia ([place]) [D69.6] 07/10/2011  . BIPOLAR AFFECTIVE DISORDER [F31.9] 08/15/2006  . SMOKER [F17.200] 08/15/2006  . COPD [J44.9] 08/15/2006  . BOILS, RECURRENT [L02.92, L02.93] 08/15/2006  . ACNE ROSACEA [L71.9] 08/15/2006  . ACNE NEC [L70.8] 08/15/2006  . EDEMA [R60.9] 08/15/2006  . INCONTINENCE, URGE [N39.41] 08/15/2006   Total Time spent with patient: 20 minutes  Past Psychiatric History: Pt with hx of  Bipolar disorder , several hospitalizations in the past. Pt unable to provide further details . Will work on obtaining collateral information.   Past Medical History:  Past Medical History  Diagnosis Date  . Anemia 07/10/2011  . Thrombocytopenia ([place]) 07/10/2011  . HTN (hypertension)   . COPD (chronic obstructive pulmonary disease) ([place])   . Hyperlipidemia   . Hypothyroidism   . Bipolar affective ([place])   . DM (diabetes mellitus) ([place])   . Renal insufficiency   . 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 Hypertension     Past Surgical History  Procedure Laterality Date  . Ankle surgery Left 1989  . Tonsillectomy  AGE 25  . Breast surgery Right 2010    BREAST  . Ventral hernia repair N/A 06/06/2013    Procedure:  OPEN VENTRAL HERNIA REPAIR;  Surgeon: Imogene Burn. Georgette Dover, MD;  Location: [facility]S;  Service: General;  Laterality: N/A;  . Insertion of mesh N/A 06/06/2013    Procedure: INSERTION OF MESH;  Surgeon: Imogene Burn. Georgette Dover, MD;  Location: [facility] ORS;  Service: General;  Laterality: N/A;   Family History:  Family History  Problem Relation Age of Onset  . Diabetes Mother   . Cancer Father   . HIV Brother    Family Psychiatric  History: Pt unable to provide details about family hx . Social History: Pt reports she is divorced and lives with a room mate .  History  Alcohol Use No     History  Drug Use No    Social History   Social History  . Marital Status: Divorced    Spouse Name:  N/A  . Number of Children: N/A  . Years of Education: N/A   Social History Main Topics  . Smoking status: Current Every Day Smoker -- 0.50 packs/day for 40 years    Types: Cigarettes, Cigars  . Smokeless tobacco: Never Used  . Alcohol Use: No  . Drug Use: No  . Sexual Activity: Not Asked   Other Topics Concern  . None   Social History Narrative   Additional Social History:    History of alcohol / drug use?: No history of alcohol / drug abuse (unable to  assess)  Sleep: Fair-  Appetite:   Improved   Current Medications: Current Facility-Administered Medications  Medication Dose Route Frequency Provider Last Rate Last Dose  . acetaminophen (TYLENOL) tablet 650 mg  650 mg Oral Q4H PRN Lurena Nida, NP   650 mg at 07/03/15 1724  . albuterol (PROVENTIL HFA;VENTOLIN HFA) 108 (90 BASE) MCG/ACT inhaler 1-2 puff  1-2 puff Inhalation Q6H PRN Lurena Nida, NP   2 puff at 07/01/15 1924  . alum & mag hydroxide-simeth (MAALOX/MYLANTA) 200-200-20 MG/5ML suspension 30 mL  30 mL Oral PRN Lurena Nida, NP      . ARIPiprazole (ABILIFY) tablet 15 mg  15 mg Oral BID Charlcie Cradle, MD   15 mg at 07/07/15 0733  . atorvastatin (LIPITOR) tablet 40 mg  40 mg Oral QHS Lurena Nida, NP   40 mg at 07/06/15 2121  . benztropine (COGENTIN) tablet 0.5 mg  0.5 mg Oral Daily Ursula Alert, MD   0.5 mg at 07/07/15 0734  . carvedilol (COREG) tablet 3.125 mg  3.125 mg Oral q morning - 10a Lurena Nida, NP   3.125 mg at 07/07/15 1021  . chlorpheniramine-HYDROcodone (TUSSIONEX) 10-8 MG/5ML suspension 5 mL  5 mL Oral Q12H PRN Lurena Nida, NP   5 mL at 07/05/15 0234  . diphenhydrAMINE (BENADRYL) capsule 50 mg  50 mg Oral Q6H PRN Ursula Alert, MD       Or  . diphenhydrAMINE (BENADRYL) injection 50 mg  50 mg Intramuscular Q6H PRN Saramma Eappen, MD      . divalproex (DEPAKOTE) DR tablet 500 mg  500 mg Oral [id number] Ursula Alert, MD   500 mg at 07/07/15 0733  . eszopiclone (LUNESTA) tablet 1 mg  1 mg Oral QHS Ursula Alert, MD   1 mg at 07/06/15 2121  . feeding supplement (GLUCERNA SHAKE) (GLUCERNA SHAKE) liquid 237 mL  237 mL Oral TID BM Saramma Eappen, MD   237 mL at 07/07/15 1008  . gabapentin (NEURONTIN) capsule 200 mg  200 mg Oral [id number] Saramma Eappen, MD   200 mg at 07/07/15 0734  . haloperidol (HALDOL) tablet 5 mg  5 mg Oral Q6H PRN Ursula Alert, MD   5 mg at 07/03/15 0831   Or  . haloperidol lactate (HALDOL) injection 5 mg  5 mg Intramuscular Q6H PRN  Ursula Alert, MD      . hydrocortisone cream 1 %   Topical TID Ursula Alert, MD      . hydrOXYzine (ATARAX/VISTARIL) tablet 25 mg  25 mg Oral TID Ursula Alert, MD   25 mg at 07/07/15 1204  . levothyroxine (SYNTHROID, LEVOTHROID) tablet 75 mcg  75 mcg Oral QAC breakfast Lurena Nida, NP   75 mcg at 07/07/15 9326  . LORazepam (ATIVAN) tablet 1 mg  1 mg Oral Q8H PRN Ursula Alert, MD   1 mg at 07/05/15 0234   Or  . LORazepam (  ATIVAN) injection 1 mg  1 mg Intramuscular Q8H PRN Ursula Alert, MD      . mometasone-formoterol (DULERA) 100-5 MCG/ACT inhaler 2 puff  2 puff Inhalation BID Lurena Nida, NP   2 puff at 07/07/15 0735  . multivitamin with minerals tablet 1 tablet  1 tablet Oral Daily Ursula Alert, MD   1 tablet at 07/07/15 0734  . neomycin-bacitracin-polymyxin (NEOSPORIN) ointment   Topical BID Saramma Eappen, MD      . nicotine (NICODERM CQ - dosed in mg/24 hours) patch 21 mg  21 mg Transdermal Daily Benjamine Mola, FNP   21 mg at 07/04/15 0817  . ramipril (ALTACE) capsule 2.5 mg  2.5 mg Oral q morning - 10a Lurena Nida, NP   2.5 mg at 07/07/15 1021    Lab Results:  Results for orders placed or performed during the hospital encounter of 06/28/15 (from the past 48 hour(s))  Glucose, capillary     Status: Abnormal   Collection Time: 07/06/15  6:53 AM  Result Value Ref Range   Glucose-Capillary 132 (H) 65 - 99 mg/dL   Comment 1 Notify RN     Physical Findings: AIMS: Facial and Oral Movements Muscles of Facial Expression: None, normal Lips and Perioral Area: None, normal Jaw: None, normal Tongue: None, normal,Extremity Movements Upper (arms, wrists, hands, fingers): None, normal Lower (legs, knees, ankles, toes): None, normal, Trunk Movements Neck, shoulders, hips: None, normal, Overall Severity Severity of abnormal movements (highest score from questions above): None, normal Incapacitation due to abnormal movements: None, normal Patient's awareness of abnormal  movements (rate only patient's report): No Awareness, Dental Status Current problems with teeth and/or dentures?: No Does patient usually wear dentures?: No  CIWA:  CIWA-Ar Total: 3 COWS:  COWS Total Score: 2  Musculoskeletal: Strength & Muscle Tone: within normal limits Gait & Station: normal Patient leans: N/A  Psychiatric Specialty Exam: Review of Systems  Musculoskeletal: Positive for back pain.  Skin:       Several lesions all over her back and 1-2 on her scalp - likely from itching   Neurological: Positive for headaches. Negative for seizures and loss of consciousness.  Psychiatric/Behavioral: Negative for depression and suicidal ideas. The patient is nervous/anxious. The patient does not have insomnia.   All other systems reviewed and are negative.   Blood pressure 142/72, pulse 74, temperature 98.2 F (36.8 C), temperature source Oral, resp. rate 18, height _0  (1.651 m), weight 208 lb (94.348 kg), SpO2 95 %.Body mass index is 34.61 kg/(m^2).  General Appearance: improving slowly  Eye Contact::  Good  Speech:  Still pressured   Volume:  Normal  Mood:   Repots mood is "OK", appears euthymic , but not overtly euphoric or irritable at this time  Affect:   Appropriate, less labile   Thought Process:  Disorganized and Tangential   Orientation:   Alert and attentive   Thought Content:   At present denies hallucinations and does not appear internally preoccupied   Suicidal Thoughts:  No- denies any suicidal or self injurious ideations, contracts for safety on unit   Homicidal Thoughts:  No  Memory:  Immediate;   Fair Recent;   Poor Remote;   Poor  Judgement:  Fair  Insight:  Lacking  Psychomotor Activity:  Normal  Concentration:  Poor  Recall:  [place] of Knowledge:Fair  Language: Fair  Akathisia:  No  Handed:  Right  AIMS (if indicated):     Assets:  Others:  access to health care  ADL's:  Improving   Cognition: Impaired,  Mild  Sleep:  Number of Hours: 3.5   Assessment - patient presents partially improved compared to her admission presentation. Although remains tangential in thought process and pressured in speech, has improved, currently less loud, less intrusive, with less expansive affect, able to be more visible and participative in milieu activities. At this time tolerating medications well .  Treatment Plan Summary: Daily contact with patient to assess and evaluate symptoms and progress in treatment and Medication management  Continue to encourage group participation , milieu Will continue Depakote DR 500 mg  TID for mood disorder . Valproic Acid level in AM Continue Abilify 15 mg  BID for psychosis, mood disorder  Continue  Lunesta 1 mg  QHS  for sleep. Continue Haldol 5 mg po /IM prn , Benadryl 50 mg PO/IM prn ,Ativan 1 mg PO/IM prn for severe anxiety/agitation. Treatment team working on disposition planning, at this time plan is for patient to return to her [facility] after discharge .      Neita Garnet MD 07/07/2015, 3:30 PM

## 2015-07-07 NOTE — Progress Notes (Signed)
Pt complained of being harassed by another pt and accusing her of stilling his money hoarding staff in the day room. The writer went to the day room and other pt was seating by the door watching TV. This Probation officer did not hear the other pt say a word.

## 2015-07-07 NOTE — Progress Notes (Signed)
D. Pt continue to be disorganized and hyper talkative. Pt complained of a pain on her left big toe, PRN offered but pt declined, NP notified.  Pt has been interactive with peers and staff without problems. Per self inventory, pt rates depression at a 0, hopelessness 0 and anxiety 0. Pt's daily goal is to "Vote, go back to college for master's degree, get married and have a wonderful life" and intend to do so by "go home sweet home." Pt reports good sleep, a good appetite, high energy and good. Pt's safety ensured with 15 minute and environmental checks. Pt currently denies SI/HI and A/V hallucinations. Pt verbally agrees to seek staff if SI/HI or A/VH occurs and to consult with staff before acting on these thoughts. Will continue POC. concentration.

## 2015-07-08 LAB — GLUCOSE, CAPILLARY: Glucose-Capillary: 79 mg/dL (ref 65–99)

## 2015-07-08 LAB — VALPROIC ACID LEVEL: VALPROIC ACID LVL: 58 ug/mL (ref 50.0–100.0)

## 2015-07-08 MED ORDER — TRAZODONE HCL 100 MG PO TABS
100.0000 mg | ORAL_TABLET | Freq: Every evening | ORAL | Status: DC | PRN
  Administered 2015-07-08: 100 mg via ORAL
  Filled 2015-07-08 (×2): qty 1

## 2015-07-08 MED ORDER — ESZOPICLONE 2 MG PO TABS: 2.0000 mg | ORAL_TABLET | Freq: Every day | ORAL | Status: DC

## 2015-07-08 MED ORDER — GABAPENTIN 300 MG PO CAPS
300.0000 mg | ORAL_CAPSULE | ORAL | Status: DC
  Filled 2015-07-08 (×5): qty 1

## 2015-07-08 NOTE — Progress Notes (Signed)
Patient up on unit.  Pressured speech noted at times.  Patient requires frequent redirection.  Refused to take Lunesta, Neurontin or any PRN'S offered.  States she is allergic to these meds,however,they are not on her allergy list.  People seems to be more psychotic today. Will continue to monitor.

## 2015-07-08 NOTE — [facility] Group Notes (Signed)
[facility] LCSW Group Therapy  07/08/2015 , 2:20 PM   Type of Therapy:  Group Therapy  Participation Level:  Active  Participation Quality:  Attentive  Affect:  Appropriate  Cognitive:  Alert  Insight:  Improving  Engagement in Therapy:  Engaged  Modes of Intervention:  Discussion, Exploration and Socialization  Summary of Progress/Problems: Today's group focused on the term Diagnosis.  Participants were asked to define the term, and then pronounce whether it is a negative, positive or neutral term.  Was in a foul mood this AM, but doing better by group time.  Said she is now happy with all her meds, and that the Dr is "finally listening to me."  Gave examples of when she had forgiven others, and how that mended the relationship.  When instructed to shorten her answers, she was able to comply.    [name] 07/08/2015 , 2:20 PM

## 2015-07-08 NOTE — Plan of Care (Signed)
Problem: Alteration in thought process Goal: LTG-Patient has not harmed self or others in at least 2 days Outcome: Completed/Met Date Met:  07/08/15 Pt up today.  No aggression noted to peers or staff.  Pt has not harmed self or others.

## 2015-07-08 NOTE — Plan of Care (Signed)
Problem: Ineffective individual coping Goal: STG-Increase in ability to manage activities of daily living Outcome: Progressing [name] is able to take care of her personal needs without assistance from staff.  Staff have to occasionally remind her that she needs to shower or wash her hair and she is receptive to the reminder.  We will continue to monitor the progress towards her goals.

## 2015-07-08 NOTE — Progress Notes (Signed)
Patient ID: [name], female   DOB: 1960-10-22, 54 y.o.   MRN: [id number] [facility] MD Progress Note  07/08/2015 3:39 PM [name]  MRN:  [id number] Subjective: patient states " I am not taking some of my medications since I am allergic to them. I want to go home."   Objective:Patient is a 54 y old CF who has a hx of Bipolar disorder , as well as multiple medical comorbidities , who presented to [facility] by EMS , for nausea after smoking a cigarette. Patient per initial notes in EHR appeared to be disorganized , delusional in the ED.  I have discussed case with treatment team and have met with patient. Pt continues to be pressured , but is improved since admission. Pt also continues to be delusional , focussed on getting [employer] to come after the staff here . Pt per staff has been intrusive on the unit , she has not been sleeping well and continues to need a lot of redirection. Pt this AM appears paranoid about her medications - states she is allergic to some of them. Medication education provided. Discussed with pt that she has been on these medications over the past several days and has had no observable side effects . Pt encouraged to take medications and attend groups.      Principal Problem: Bipolar disorder, current episode manic severe with psychotic features [facility]) Diagnosis:   Patient Active Problem List   Diagnosis Date Noted  . Skin lesions [L98.9] 07/04/2015  . Prediabetes [R73.03] 07/03/2015  . Bipolar disorder, current episode manic severe with psychotic features ([place]) [F31.2] 06/30/2015  . History of breast cancer in female [Z85.3] 04/10/2015  . Irreducible ventral hernia [K46.0] 04/12/2013  . HTN (hypertension) [I10]   . COPD (chronic obstructive pulmonary disease) ([place]) [J44.9]   . Chronic kidney disease [N18.9]   . Hyperlipidemia [E78.5]   . Hypothyroidism [E03.9]   . Bipolar affective ([place]) [F31.9]   . Anemia in chronic kidney disease(285.21) [N03.9, D63.1]  07/10/2011  . Thrombocytopenia ([place]) [D69.6] 07/10/2011  . BIPOLAR AFFECTIVE DISORDER [F31.9] 08/15/2006  . SMOKER [F17.200] 08/15/2006  . COPD [J44.9] 08/15/2006  . BOILS, RECURRENT [L02.92, L02.93] 08/15/2006  . ACNE ROSACEA [L71.9] 08/15/2006  . ACNE NEC [L70.8] 08/15/2006  . EDEMA [R60.9] 08/15/2006  . INCONTINENCE, URGE [N39.41] 08/15/2006   Total Time spent with patient: 30 minutes  Past Psychiatric History: Pt with hx of Bipolar disorder , several hospitalizations in the past. Pt unable to provide further details . Will work on obtaining collateral information.   Past Medical History:  Past Medical History  Diagnosis Date  . Anemia 07/10/2011  . Thrombocytopenia ([place]) 07/10/2011  . HTN (hypertension)   . COPD (chronic obstructive pulmonary disease) ([place])   . Hyperlipidemia   . Hypothyroidism   . Bipolar affective ([place])   . DM (diabetes mellitus) ([place])   . Renal insufficiency   . Cancer ([place]) 2012    RIGHT BREAST, RADIATION DONE, NO CHEMO  . Hypertension     Past Surgical History  Procedure Laterality Date  . Ankle surgery Left 1989  . Tonsillectomy  AGE 62  . Breast surgery Right 2010    BREAST  . Ventral hernia repair N/A 06/06/2013    Procedure:  OPEN VENTRAL HERNIA REPAIR;  Surgeon: Imogene Burn. Georgette Dover, MD;  Location: [facility] ORS;  Service: General;  Laterality: N/A;  . Insertion of mesh N/A 06/06/2013    Procedure: INSERTION OF MESH;  Surgeon: Imogene Burn. Tsuei, MD;  Location: [facility] ORS;  Service: General;  Laterality: N/A;   Family History:  Family History  Problem Relation Age of Onset  . Diabetes Mother   . Cancer Father   . HIV Brother    Family Psychiatric  History: Pt unable to provide details about family hx . Social History: Pt reports she is divorced and lives with a room mate .  History  Alcohol Use No     History  Drug Use No    Social History   Social History  . Marital Status: Divorced    Spouse Name: N/A  . Number of Children: N/A  . Years  of Education: N/A   Social History Main Topics  . Smoking status: Current Every Day Smoker -- 0.50 packs/day for 40 years    Types: Cigarettes, Cigars  . Smokeless tobacco: Never Used  . Alcohol Use: No  . Drug Use: No  . Sexual Activity: Not Asked   Other Topics Concern  . None   Social History Narrative   Additional Social History:    History of alcohol / drug use?: No history of alcohol / drug abuse (unable to assess)  Sleep: Poor-  Appetite:   Improved   Current Medications: Current Facility-Administered Medications  Medication Dose Route Frequency Provider Last Rate Last Dose  . acetaminophen (TYLENOL) tablet 650 mg  650 mg Oral Q4H PRN Lurena Nida, NP   650 mg at 07/07/15 2200  . albuterol (PROVENTIL HFA;VENTOLIN HFA) 108 (90 BASE) MCG/ACT inhaler 1-2 puff  1-2 puff Inhalation Q6H PRN Lurena Nida, NP   2 puff at 07/01/15 1924  . alum & mag hydroxide-simeth (MAALOX/MYLANTA) 200-200-20 MG/5ML suspension 30 mL  30 mL Oral PRN Lurena Nida, NP      . ARIPiprazole (ABILIFY) tablet 15 mg  15 mg Oral BID Charlcie Cradle, MD   15 mg at 07/08/15 0815  . atorvastatin (LIPITOR) tablet 40 mg  40 mg Oral QHS Lurena Nida, NP   40 mg at 07/07/15 2154  . benztropine (COGENTIN) tablet 0.5 mg  0.5 mg Oral Daily Ursula Alert, MD   0.5 mg at 07/08/15 0815  . carvedilol (COREG) tablet 3.125 mg  3.125 mg Oral q morning - 10a Lurena Nida, NP   3.125 mg at 07/08/15 1048  . chlorpheniramine-HYDROcodone (TUSSIONEX) 10-8 MG/5ML suspension 5 mL  5 mL Oral Q12H PRN Lurena Nida, NP   5 mL at 07/08/15 0030  . diphenhydrAMINE (BENADRYL) capsule 50 mg  50 mg Oral Q6H PRN Ursula Alert, MD       Or  . diphenhydrAMINE (BENADRYL) injection 50 mg  50 mg Intramuscular Q6H PRN Arline Ketter, MD      . divalproex (DEPAKOTE) DR tablet 500 mg  500 mg Oral [id number] Jaanai Salemi, MD   500 mg at 07/08/15 1431  . eszopiclone (LUNESTA) tablet 2 mg  2 mg Oral QHS Fredrich Cory, MD      . feeding  supplement (GLUCERNA SHAKE) (GLUCERNA SHAKE) liquid 237 mL  237 mL Oral TID BM Cas Tracz, MD   237 mL at 07/08/15 1431  . gabapentin (NEURONTIN) capsule 300 mg  300 mg Oral BH-[id number] Ahmari Garton, MD      . haloperidol (HALDOL) tablet 5 mg  5 mg Oral Q6H PRN Ursula Alert, MD   5 mg at 07/07/15 1842   Or  . haloperidol lactate (HALDOL) injection 5 mg  5 mg Intramuscular Q6H PRN Ursula Alert, MD      .  hydrocortisone cream 1 %   Topical TID Ursula Alert, MD      . hydrOXYzine (ATARAX/VISTARIL) tablet 25 mg  25 mg Oral TID Ursula Alert, MD   25 mg at 07/08/15 1204  . levothyroxine (SYNTHROID, LEVOTHROID) tablet 75 mcg  75 mcg Oral QAC breakfast Lurena Nida, NP   75 mcg at 07/08/15 [phone]  . LORazepam (ATIVAN) tablet 1 mg  1 mg Oral Q8H PRN Ursula Alert, MD   1 mg at 07/05/15 0234   Or  . LORazepam (ATIVAN) injection 1 mg  1 mg Intramuscular Q8H PRN Ursula Alert, MD      . mometasone-formoterol (DULERA) 100-5 MCG/ACT inhaler 2 puff  2 puff Inhalation BID Lurena Nida, NP   2 puff at 07/08/15 [phone]  . multivitamin with minerals tablet 1 tablet  1 tablet Oral Daily Ursula Alert, MD   1 tablet at 07/08/15 0815  . neomycin-bacitracin-polymyxin (NEOSPORIN) ointment   Topical BID Sabiha Sura, MD      . nicotine (NICODERM CQ - dosed in mg/24 hours) patch 21 mg  21 mg Transdermal Daily Benjamine Mola, FNP   21 mg at 07/04/15 0817  . ramipril (ALTACE) capsule 2.5 mg  2.5 mg Oral q morning - 10a Lurena Nida, NP   2.5 mg at 07/08/15 1048    Lab Results:  Results for orders placed or performed during the hospital encounter of 06/28/15 (from the past 48 hour(s))  Glucose, capillary     Status: None   Collection Time: 07/08/15  2:49 AM  Result Value Ref Range   Glucose-Capillary 79 65 - 99 mg/dL   Comment 1 Notify RN   Valproic acid level     Status: None   Collection Time: 07/08/15  6:50 AM  Result Value Ref Range   Valproic Acid Lvl 58 50.0 - 100.0 ug/mL    Comment: Performed  at [facility]    Physical Findings: AIMS: Facial and Oral Movements Muscles of Facial Expression: None, normal Lips and Perioral Area: None, normal Jaw: None, normal Tongue: None, normal,Extremity Movements Upper (arms, wrists, hands, fingers): None, normal Lower (legs, knees, ankles, toes): None, normal, Trunk Movements Neck, shoulders, hips: None, normal, Overall Severity Severity of abnormal movements (highest score from questions above): None, normal Incapacitation due to abnormal movements: None, normal Patient's awareness of abnormal movements (rate only patient's report): No Awareness, Dental Status Current problems with teeth and/or dentures?: No Does patient usually wear dentures?: Yes  CIWA:  CIWA-Ar Total: 3 COWS:  COWS Total Score: 2  Musculoskeletal: Strength & Muscle Tone: within normal limits Gait & Station: normal Patient leans: N/A  Psychiatric Specialty Exam: Review of Systems  Musculoskeletal: Positive for back pain.  Skin:       Several lesions all over her back and 1-2 on her scalp - likely from itching   Neurological: Positive for headaches. Negative for seizures and loss of consciousness.  Psychiatric/Behavioral: Negative for depression and suicidal ideas. The patient is nervous/anxious. The patient does not have insomnia.   All other systems reviewed and are negative.   Blood pressure 142/72, pulse 74, temperature 98.2 F (36.8 C), temperature source Oral, resp. rate 18, height _0  (1.651 m), weight 94.348 kg (208 lb), SpO2 95 %.Body mass index is 34.61 kg/(m^2).  General Appearance: improving slowly  Eye Contact::  Good  Speech:  Still pressured   Volume:  Normal  Mood:   Repots mood is "OK", appears euthymic , but not  overtly euphoric or irritable at this time  Affect:   Appropriate, less labile   Thought Process:  Disorganized and Tangential   Orientation:   Alert and attentive   Thought Content:   At present denies  hallucinations and does not appear internally preoccupied   Suicidal Thoughts:  No- denies any suicidal or self injurious ideations, contracts for safety on unit   Homicidal Thoughts:  No  Memory:  Immediate;   Fair Recent;   Poor Remote;   Poor  Judgement:  Fair  Insight:  Lacking  Psychomotor Activity:  Normal  Concentration:  Poor  Recall:  [place] of Knowledge:Fair  Language: Fair  Akathisia:  No  Handed:  Right  AIMS (if indicated):     Assets:  Others:  access to health care  ADL's:  Improving   Cognition: WNL  Sleep:  Number of Hours: 2.71  Assessment - Patient presents partially improved compared to her admission presentation. Although remains tangential in thought process and pressured in speech, has improved, currently less loud, less intrusive. Pt encouraged to stay compliant on medications .  Treatment Plan Summary: Daily contact with patient to assess and evaluate symptoms and progress in treatment and Medication management  Continue to encourage group participation , milieu Will continue Depakote DR 500 mg  TID for mood disorder .Depakote level on 07/08/15- 58 ug/ml. Continue Abilify 15 mg  BID for psychosis, mood disorder  Increase Lunesta to 2 mg  QHS  for sleep. Continue Gabapentin, increase to 300 mg po tid to augment the effect of Depakote for mood lability. Continue Haldol 5 mg po /IM prn , Benadryl 50 mg PO/IM prn ,Ativan 1 mg PO/IM prn for severe anxiety/agitation. Treatment team working on disposition planning, at this time plan is for patient to return to her [place] after discharge .      Malayah Demuro MD 07/08/2015, 3:39 PM

## 2015-07-08 NOTE — Progress Notes (Signed)
DAR NOTE: Patient presents with anxious affect and mood.  Denies pain, auditory and visual hallucinations.  Rates depression at 0, hopelessness at 0, and anxiety at 0.  Maintained on routine safety checks.  Medications given as prescribed.  Support and encouragement offered as needed.  Attended group and participated.  States goal for today is "Live life better and fuller,Love longer, reach high to the top of the ladder."  Patient observed socializing with peers in the dayroom.  Ambulatory on the unit without difficulty.  Offered no complaint.

## 2015-07-08 NOTE — Tx Team (Addendum)
Date: 06/30/2015 1:18 PM  Progress in Treatment:  Attending groups: Yes Participating in groups: Yes, unfortunately Taking medication as prescribed: No, Pt is not taking medications consistently Tolerating medication: Yes  Family/Significant other contact made: Yes  [name] of group home Patient understands diagnosis: No  Limited insight Discussing patient identified problems/goals with staff: Yes  Medical problems stabilized or resolved: Yes  Denies suicidal/homicidal ideation: Yes Patient has not harmed self or Others: Yes   New problem(s) identified: None identified at this time.   Discharge Plan or Barriers: Per Pt, she is from [facility]; will return there at DC. Follow up [facility] [place]  Additional comments: Meds started: Abilify 17m, Depakote 505m  07/08/15:  Abilify and Depakote have both been increased.  Poor sleep the last 2 days-will address with meds  Reason for Continuation of Hospitalization:  Medication stabilization Mania Psychosis  Estimated length of stay: 3-4 days  Review of initial/current patient goals per problem list:  1. Goal(s): Patient will participate in aftercare plan  Met:Yes  Target date: 3-5 days from date of admission  As evidenced by: Patient will participate within aftercare plan AEB aftercare provider and housing plan at discharge being identified.  06/30/15: [facility] [place] 5. Goal(s): Patient will demonstrate decreased signs of psychosis   Met: No  Target date: 3-5 days from date of admission  As evidenced by: Patient will demonstrate decreased frequency of AVH or return to baseline function 06/30/15: Pt continues to be actively psychotic; observed to be responding to internal stimuli, delusional thought content 07/08/15:  Today pt presents with paranoia, delusions.  Refusing meds because  she is allergic to all of them,and we are trying to "poison" her   07/03/15: Pt denies AVH and is exhibiting more appropriate behavior. 6. Goal (s): Patient will demonstrate decreased signs of mania  Met: Progressing  Target date: 3-5 days from date of admission  As evidenced by: Patient demonstrate decreased signs of mania AEB decreased mood instability and demonstration of stable mood 06/30/15: Pt continues to exhibit pressured speech, delusional and disorganized thought process, with labile mood.  07/03/15: Pt is still exhibiting pressured speech and flight of ideas. However, pt's impulse control seems to be improving as evidenced by pt raising her hand to speak while in group instead of interrupting. 07/08/15:  Mood is stable.  Sleep is poor, and she continues to present with pressured speech and flight of ideas.    Attendees:  Patient:    Family:    Physician: Dr EaShea Evans10/31/2016 1:18 PM  Nursing: JeLars PinksRN Case manager  06/30/2015 1:18 PM  Clinical Social Worker Rod noTolono0/31/2016 1:18 PM  Other:  06/30/2015 1:18 PM  Clinical: ElHedy Jacob0/31/2016 1:18 PM  Other: , RN Charge Nurse 06/30/2015 1:18 PM  Other:     LaPeri MarisLCLatanya PresserSW

## 2015-07-08 NOTE — Plan of Care (Signed)
Problem: Alteration in mood Goal: LTG-Patient reports reduction in suicidal thoughts (Patient reports reduction in suicidal thoughts and is able to verbalize a safety plan for whenever patient is feeling suicidal)  Outcome: Completed/Met Date Met:  07/08/15 Pt denies any thoughts of suicide this shift.

## 2015-07-08 NOTE — [facility] Group Notes (Signed)
[place] Group Notes:  (Nursing/MHT/Case Management/Adjunct)  Date:  07/08/2015  Time:  0930 Type of Therapy:  Nurse Education  Participation Level:  Active  Participation Quality:  Appropriate and Attentive  Affect:  Appropriate  Cognitive:  Alert and Appropriate  Insight:  Good  Engagement in Group:  Improving  Modes of Intervention:  Education  Summary of Progress/Problems: Discussed healthy communication skills.  Patient was receptive and participated. Mart Piggs 07/08/2015, 11:59 AM

## 2015-07-09 MED ORDER — ZOLPIDEM TARTRATE 10 MG PO TABS
10.0000 mg | ORAL_TABLET | Freq: Every day | ORAL | Status: DC
  Administered 2015-07-10 – 2015-07-13 (×4): 10 mg via ORAL
  Filled 2015-07-09 (×5): qty 1

## 2015-07-09 MED ORDER — HYDROXYZINE HCL 25 MG PO TABS: 25.0000 mg | ORAL_TABLET | Freq: Two times a day (BID) | ORAL | Status: DC | PRN

## 2015-07-09 MED ORDER — DIVALPROEX SODIUM 500 MG PO DR TAB
500.0000 mg | DELAYED_RELEASE_TABLET | ORAL | Status: DC
  Administered 2015-07-09 – 2015-07-14 (×11): 500 mg via ORAL
  Filled 2015-07-09 (×14): qty 1

## 2015-07-09 MED ORDER — DIVALPROEX SODIUM 250 MG PO DR TAB
750.0000 mg | DELAYED_RELEASE_TABLET | Freq: Every day | ORAL | Status: DC
  Administered 2015-07-09 – 2015-07-13 (×5): 750 mg via ORAL
  Filled 2015-07-09 (×7): qty 3

## 2015-07-09 MED ORDER — HYDROXYZINE HCL 50 MG PO TABS: 50.0000 mg | ORAL_TABLET | Freq: Every evening | ORAL | Status: DC | PRN

## 2015-07-09 MED ORDER — GABAPENTIN 400 MG PO CAPS
400.0000 mg | ORAL_CAPSULE | ORAL | Status: DC
  Administered 2015-07-10 – 2015-07-14 (×13): 400 mg via ORAL
  Filled 2015-07-09 (×21): qty 1

## 2015-07-09 NOTE — Progress Notes (Signed)
Patient ID: [name], female   DOB: Jan 27, 1961, 54 y.o.   MRN: [id number] [facility] MD Progress Note  07/09/2015 11:49 AM KATHERN LOBOSCO  MRN:  [id number] Subjective: patient states " I am fine , I want to go home. I am not not taking some of my medications since I am allergic to them. I am sleeping , they are all lying. I am not even a patient here , I came here to work.'    Objective:Patient is a 54 y old CF who has a hx of Bipolar disorder , as well as multiple medical comorbidities , who presented to [facility] by EMS , for nausea after smoking a cigarette. Patient per initial notes in EHR appeared to be disorganized , delusional in the ED.  I have discussed case with treatment team and have met with patient. Pt continues to be pressured , tangential and continues to be delusional , refusing some of her medications , believes that she is not a patient here . Pt is delusional and believes that the staff here are lying about her not sleeping when she is actually doing so . Discussed several medication options with her - pt did not respond well to Trazodone , which was discontinued , pt was doing OK on Lunesta , but she has been refusing it stating she is allergic to all her medications including Lunesta ( of note - there has been no observed side effects noted and she does not verbalize any issues ) . Pt does not want to be on seroquel , but states she may take Ambien tonight. Will start her on the same. Pt otherwise is observed as taking care of her ADLs , which she was not doing on admission. Per staff - pt seen on the unit as labile , agitated , arguing with peers and refusing some of her medications including her Abilify. Pt is not sleeping and seems to be getting worse with regards to her mood lability.       Principal Problem: Bipolar disorder, current episode manic severe with psychotic features [facility]) Diagnosis:   Patient Active Problem List   Diagnosis Date Noted  . Skin lesions  [L98.9] 07/04/2015  . Prediabetes [R73.03] 07/03/2015  . Bipolar disorder, current episode manic severe with psychotic features ([place]) [F31.2] 06/30/2015  . History of breast cancer in female [Z85.3] 04/10/2015  . Irreducible ventral hernia [K46.0] 04/12/2013  . HTN (hypertension) [I10]   . COPD (chronic obstructive pulmonary disease) ([place]) [J44.9]   . Chronic kidney disease [N18.9]   . Hyperlipidemia [E78.5]   . Hypothyroidism [E03.9]   . Bipolar affective ([place]) [F31.9]   . Anemia in chronic kidney disease(285.21) [N03.9, D63.1] 07/10/2011  . Thrombocytopenia ([place]) [D69.6] 07/10/2011  . BIPOLAR AFFECTIVE DISORDER [F31.9] 08/15/2006  . SMOKER [F17.200] 08/15/2006  . COPD [J44.9] 08/15/2006  . BOILS, RECURRENT [L02.92, L02.93] 08/15/2006  . ACNE ROSACEA [L71.9] 08/15/2006  . ACNE NEC [L70.8] 08/15/2006  . EDEMA [R60.9] 08/15/2006  . INCONTINENCE, URGE [N39.41] 08/15/2006   Total Time spent with patient: 30 minutes  Past Psychiatric History: Pt with hx of Bipolar disorder , several hospitalizations in the past. Pt unable to provide further details . Will work on obtaining collateral information.   Past Medical History:  Past Medical History  Diagnosis Date  . Anemia 07/10/2011  . Thrombocytopenia ([place]) 07/10/2011  . HTN (hypertension)   . COPD (chronic obstructive pulmonary disease) ([place])   . Hyperlipidemia   . Hypothyroidism   . Bipolar  affective ([place])   . DM (diabetes mellitus) ([place])   . Renal insufficiency   . 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 Hypertension     Past Surgical History  Procedure Laterality Date  . Ankle surgery Left 1989  . Tonsillectomy  AGE 57  . Breast surgery Right 2010    BREAST  . Ventral hernia repair N/A 06/06/2013    Procedure:  OPEN VENTRAL HERNIA REPAIR;  Surgeon: Imogene Burn. Georgette Dover, MD;  Location: [facility] ORS;  Service: General;  Laterality: N/A;  . Insertion of mesh N/A 06/06/2013    Procedure: INSERTION OF MESH;   Surgeon: Imogene Burn. Georgette Dover, MD;  Location: [facility] ORS;  Service: General;  Laterality: N/A;   Family History:  Family History  Problem Relation Age of Onset  . Diabetes Mother   . Cancer Father   . HIV Brother    Family Psychiatric  History: Pt unable to provide details about family hx . Social History: Pt reports she is divorced and lives with a room mate .  History  Alcohol Use No     History  Drug Use No    Social History   Social History  . Marital Status: Divorced    Spouse Name: N/A  . Number of Children: N/A  . Years of Education: N/A   Social History Main Topics  . Smoking status: Current Every Day Smoker -- 0.50 packs/day for 40 years    Types: Cigarettes, Cigars  . Smokeless tobacco: Never Used  . Alcohol Use: No  . Drug Use: No  . Sexual Activity: Not Asked   Other Topics Concern  . None   Social History Narrative   Additional Social History:    History of alcohol / drug use?: No history of alcohol / drug abuse (unable to assess)  Sleep: Poor-  Appetite:   Improved   Current Medications: Current Facility-Administered Medications  Medication Dose Route Frequency Provider Last Rate Last Dose  . acetaminophen (TYLENOL) tablet 650 mg  650 mg Oral Q4H PRN Lurena Nida, NP   650 mg at 07/08/15 2209  . albuterol (PROVENTIL HFA;VENTOLIN HFA) 108 (90 BASE) MCG/ACT inhaler 1-2 puff  1-2 puff Inhalation Q6H PRN Lurena Nida, NP   2 puff at 07/01/15 1924  . alum & mag hydroxide-simeth (MAALOX/MYLANTA) 200-200-20 MG/5ML suspension 30 mL  30 mL Oral PRN Lurena Nida, NP   30 mL at 07/09/15 0428  . ARIPiprazole (ABILIFY) tablet 15 mg  15 mg Oral BID Charlcie Cradle, MD   15 mg at 07/08/15 0815  . atorvastatin (LIPITOR) tablet 40 mg  40 mg Oral QHS Lurena Nida, NP   40 mg at 07/08/15 2209  . benztropine (COGENTIN) tablet 0.5 mg  0.5 mg Oral Daily Ursula Alert, MD   0.5 mg at 07/08/15 0815  . carvedilol (COREG) tablet 3.125 mg  3.125 mg Oral q morning - 10a Lurena Nida, NP   3.125 mg at 07/09/15 1032  . chlorpheniramine-HYDROcodone (TUSSIONEX) 10-8 MG/5ML suspension 5 mL  5 mL Oral Q12H PRN Lurena Nida, NP   5 mL at 07/08/15 0030  . diphenhydrAMINE (BENADRYL) capsule 50 mg  50 mg Oral Q6H PRN Ursula Alert, MD       Or  . diphenhydrAMINE (BENADRYL) injection 50 mg  50 mg Intramuscular Q6H PRN Fabiano Ginley, MD      . divalproex (DEPAKOTE) DR tablet 500 mg  500 mg Oral [id number]  Ursula Alert, MD      . divalproex (DEPAKOTE) DR tablet 750 mg  750 mg Oral QHS  , MD      . feeding supplement (GLUCERNA SHAKE) (GLUCERNA SHAKE) liquid 237 mL  237 mL Oral TID BM  , MD   237 mL at 07/09/15 1007  . gabapentin (NEURONTIN) capsule 400 mg  400 mg Oral [id number]  , MD      . haloperidol (HALDOL) tablet 5 mg  5 mg Oral Q6H PRN Ursula Alert, MD   5 mg at 07/07/15 1842   Or  . haloperidol lactate (HALDOL) injection 5 mg  5 mg Intramuscular Q6H PRN Ursula Alert, MD      . hydrocortisone cream 1 %   Topical TID Ursula Alert, MD      . hydrOXYzine (ATARAX/VISTARIL) tablet 25 mg  25 mg Oral BID PRN Ursula Alert, MD      . hydrOXYzine (ATARAX/VISTARIL) tablet 50 mg  50 mg Oral QHS PRN Ursula Alert, MD      . levothyroxine (SYNTHROID, LEVOTHROID) tablet 75 mcg  75 mcg Oral QAC breakfast Lurena Nida, NP   75 mcg at 07/09/15 8022  . LORazepam (ATIVAN) tablet 1 mg  1 mg Oral Q8H PRN Ursula Alert, MD   1 mg at 07/05/15 0234   Or  . LORazepam (ATIVAN) injection 1 mg  1 mg Intramuscular Q8H PRN Ursula Alert, MD      . mometasone-formoterol (DULERA) 100-5 MCG/ACT inhaler 2 puff  2 puff Inhalation BID Lurena Nida, NP   2 puff at 07/09/15 0803  . multivitamin with minerals tablet 1 tablet  1 tablet Oral Daily Ursula Alert, MD   1 tablet at 07/09/15 0801  . neomycin-bacitracin-polymyxin (NEOSPORIN) ointment   Topical BID  , MD      . nicotine (NICODERM CQ - dosed in mg/24 hours) patch 21 mg  21 mg  Transdermal Daily Benjamine Mola, FNP   21 mg at 07/04/15 0817  . ramipril (ALTACE) capsule 2.5 mg  2.5 mg Oral q morning - 10a Lurena Nida, NP   2.5 mg at 07/09/15 1032  . zolpidem (AMBIEN) tablet 10 mg  10 mg Oral QHS Ursula Alert, MD        Lab Results:  Results for orders placed or performed during the hospital encounter of 06/28/15 (from the past 48 hour(s))  Glucose, capillary     Status: None   Collection Time: 07/08/15  2:49 AM  Result Value Ref Range   Glucose-Capillary 79 65 - 99 mg/dL   Comment 1 Notify RN   Valproic acid level     Status: None   Collection Time: 07/08/15  6:50 AM  Result Value Ref Range   Valproic Acid Lvl 58 50.0 - 100.0 ug/mL    Comment: Performed at [facility]    Physical Findings: AIMS: Facial and Oral Movements Muscles of Facial Expression: None, normal Lips and Perioral Area: None, normal Jaw: None, normal Tongue: None, normal,Extremity Movements Upper (arms, wrists, hands, fingers): None, normal Lower (legs, knees, ankles, toes): None, normal, Trunk Movements Neck, shoulders, hips: None, normal, Overall Severity Severity of abnormal movements (highest score from questions above): None, normal Incapacitation due to abnormal movements: None, normal Patient's awareness of abnormal movements (rate only patient's report): No Awareness, Dental Status Current problems with teeth and/or dentures?: No Does patient usually wear dentures?: Yes  CIWA:  CIWA-Ar Total: 3 COWS:  COWS Total Score: 2  Musculoskeletal:  Strength & Muscle Tone: within normal limits Gait & Station: normal Patient leans: N/A  Psychiatric Specialty Exam: Review of Systems  Musculoskeletal: Positive for back pain.  Skin:       Lesions - ?s/p bug bites of due to itching   Psychiatric/Behavioral: The patient is nervous/anxious and has insomnia.   All other systems reviewed and are negative.   Blood pressure 130/83, pulse 87, temperature 98.2 F (36.8  C), temperature source Oral, resp. rate 18, height 5' 5" (1.651 m), weight 94.348 kg (208 lb), SpO2 95 %.Body mass index is 34.61 kg/(m^2).  General Appearance: Fairly Groomed  Engineer, water::  Good  Speech:  Still pressured   Volume:  Increased  Mood:irritable  Affect: labile    Thought Process:  Disorganized and Tangential   Orientation: oriented to person, place and situation  Thought Content:   At present denies hallucinations, but appears paranoid , thinks staff here is lying about her , she believes she is not a patient here and she came here to work  Suicidal Thoughts:  No- denies any suicidal or self injurious ideations, contracts for safety on unit   Homicidal Thoughts:  No  Memory:  Immediate;   Fair Recent;   Poor Remote;   Poor  Judgement:  Fair  Insight:  Lacking  Psychomotor Activity:  Normal  Concentration:  Poor  Recall:  [place] of Knowledge:Fair  Language: Fair  Akathisia:  No  Handed:  Right  AIMS (if indicated):     Assets:  Others:  access to health care  ADL's:  Improving   Cognition: WNL  Sleep:  Number of Hours: 1.75   Assessment - Patient presents worse than the past few days , has been refusing her Abilify - missed two doses since yesterday , is not sleeping , seen as very labile and delusional . Discussed with Dr.Lugo for second opinion for forced medications order . Dr.Lugo to evaluate patient for the same.  Treatment Plan Summary: Daily contact with patient to assess and evaluate symptoms and progress in treatment and Medication management  Continue to encourage group participation , milieu Will increase Depakote DR 500 mg BID and increase to 750 mg po qhs  for mood disorder .Depakote level on 07/08/15- 58 ug/ml. Will get another level in 5 days ( 07/14/15) Continue Abilify 15 mg  BID for psychosis, mood disorder . Will consider adding Haldol 5 mg PO /Haldol 2.5 mg IM to augment the effect of Abilify.Will await Second opinion.  Discussed several  sleep medications with patient - pt slightly agreeable to taking Ambien tonight- will start her on Ambien 10 mg po qhs. Will also make available Vistaril 50 mg po qhs prn for sleep . Increase Gabapentin 400 mg po tid to augment the effect of Depakote for mood lability. Continue Haldol 5 mg po /IM prn , Benadryl 50 mg PO/IM prn ,Ativan 1 mg PO/IM prn for severe anxiety/agitation. Treatment team working on disposition planning, at this time plan is for patient to return to her [place] after discharge .      Jayline Kilburg MD 07/09/2015, 11:49 AM

## 2015-07-09 NOTE — BHH Group Notes (Signed)
[facility] LCSW Aftercare Discharge Planning Group Note   07/09/2015 10:22 AM  Participation Quality:  Active   Mood/Affect:  Appropriate  Depression Rating:  0  Anxiety Rating: 0   Thoughts of Suicide:  No Will you contract for safety?   NA  Current AVH:  No  Plan for Discharge/Comments:  Pt denies all symptoms but sleeps poorly and continues to be non-compliant with meds. Pt stated, "I'm not taking any of those meds, I will sue everyone! I just need to get out of here and go back to my life and back to my friends at [facility]". Pt still presents with pressured speech and is difficult to follow. Pt became upset when told that she would be staying in the hospital longer if she continued refusing meds. Left group angrily and did not return.    Transportation Means:   Supports:  Georga Kaufmann

## 2015-07-09 NOTE — Progress Notes (Signed)
DAR Note: [name] has been visible on the unit.  She denies any SI/HI or A/V hallucinations.  Mood is labile.  She was noted crying at nurses stating then arguing with peers in the day room.  Hyper verbal and intrusive.  She refused to take her Neurontin, Abilify, Vistaril and Cogentin stating "I'm highly allergic to all."  Tried to talk with her about the fact that she has been taking them previously but continued to adamantly deny that she was going to take them.  She has agreed to take her heart medications and depakote.  Encouraged her to take prescribed medications.  She continues to argue and state she will only take the medications that her regular doctor prescribes.  Relayed the above to treatment team this morning.  She completed her self inventory and reports that her depression, hopelessness and anxiety are all 0/10.  She states that her goal for today is to go home."  She continues to feel like she isn't a patient on the unit but believes that she is staff.  Encouraged her to participate in group and unit activities.  Q 15 minute checks maintained for safety.  She denies any physical problems and appears to be in no physical distress.  We will continue to monitor the progress towards her goals.

## 2015-07-09 NOTE — Progress Notes (Signed)
Patient still sleeping after haldol and trazadone. Resp even and unlabored.  Will continue to monitor for safety.

## 2015-07-09 NOTE — Progress Notes (Signed)
[name] continues to refuse scheduled medications.  She came up to writer after supper and asked to have a haldol prn.  "I think I need it."  She has been irritable, argumentative and hyper verbal.   Haldol prn given and we will monitor for effectiveness.

## 2015-07-09 NOTE — BHH Group Notes (Signed)
[place] LCSW Group Therapy  07/09/2015 1:48 PM  Type of Therapy: Group Therapy  Participation Level: Active  Participation Quality: Attentive  Affect: Flat  Cognitive: Oriented  Insight: Limited  Engagement in Therapy: Engaged  Modes of Intervention: Discussion and Socialization  Summary of Progress/Problems: [name] from the [place] was here to tell his story of recovery and play his guitar. Pt was alert and pleasant in group today. Pt left midway through the group and did not return.  [name]. [name] 07/09/2015 1:48 PM

## 2015-07-09 NOTE — Progress Notes (Signed)
Patient upset with pressured speech.  States another pt is taking things out of her room.  Pt reassured no one would go into her room tonight.  PRN given for increased agiation and to promote sleep.

## 2015-07-09 NOTE — Progress Notes (Signed)
Patient slept for approximately 2.5 hours.  Will continue to monitor for safety.

## 2015-07-10 MED ORDER — ARIPIPRAZOLE ER 400 MG IM SUSR
400.0000 mg | INTRAMUSCULAR | Status: DC
  Administered 2015-07-10: 400 mg via INTRAMUSCULAR
  Filled 2015-07-10: qty 400

## 2015-07-10 MED ORDER — ARIPIPRAZOLE 15 MG PO TABS
15.0000 mg | ORAL_TABLET | Freq: Two times a day (BID) | ORAL | Status: DC
  Administered 2015-07-10: 15 mg via ORAL
  Filled 2015-07-10 (×2): qty 1

## 2015-07-10 MED ORDER — BENZTROPINE MESYLATE 0.5 MG PO TABS
0.5000 mg | ORAL_TABLET | Freq: Two times a day (BID) | ORAL | Status: DC
  Administered 2015-07-10 – 2015-07-14 (×9): 0.5 mg via ORAL
  Filled 2015-07-10 (×13): qty 1

## 2015-07-10 MED ORDER — HALOPERIDOL LACTATE 5 MG/ML IJ SOLN
5.0000 mg | Freq: Two times a day (BID) | INTRAMUSCULAR | Status: DC
  Filled 2015-07-10 (×2): qty 1

## 2015-07-10 MED ORDER — BENZTROPINE MESYLATE 1 MG/ML IJ SOLN
0.5000 mg | Freq: Two times a day (BID) | INTRAMUSCULAR | Status: DC
  Filled 2015-07-10 (×12): qty 0.5

## 2015-07-10 MED ORDER — ARIPIPRAZOLE 15 MG PO TABS
15.0000 mg | ORAL_TABLET | ORAL | Status: DC
  Administered 2015-07-10 – 2015-07-14 (×8): 15 mg via ORAL
  Filled 2015-07-10 (×12): qty 1

## 2015-07-10 MED ORDER — HALOPERIDOL LACTATE 5 MG/ML IJ SOLN
5.0000 mg | INTRAMUSCULAR | Status: DC
  Filled 2015-07-10 (×12): qty 1

## 2015-07-10 NOTE — Progress Notes (Signed)
Pt attended and participated in evening karaoke group.

## 2015-07-10 NOTE — Progress Notes (Addendum)
Patient ID: [name], female   DOB: [birth year], 54 y.o.   MRN: [id number] [facility] MD Progress Note  07/10/2015 12:01 PM ANAMIKA KUEKER  MRN:  [id number] Subjective: patient states " I am fine , I am taking all my medications , you are a b......, you are a quack . I am not taking some of them , they are not the good ones.'      Objective:Patient is a 26 y old CF who has a hx of Bipolar disorder , as well as multiple medical comorbidities , who presented to [facility] by EMS , for nausea after smoking a cigarette. Patient per initial notes in EHR appeared to be disorganized , delusional in the ED.  I have discussed case with treatment team and have met with patient.Pt today continues to be pressured , tangential and continues to be delusional , refusing some of her medications. Pt seen as agitated , loud , using profanity , when Probation officer discussed with pt about forced medications. Pt was seen by Dr.Lugo who has done a second opinion on patient to start her on forced medications , since she refuses her PO antipsychotics as well as sleep medication. Pt per staff continues to be disorganized , has sleep issues and is seen as restless , irritable on the unit . Will continue to encourage pt to take her medications as well as participate in milieu.       Principal Problem: Bipolar disorder, current episode manic severe with psychotic features [facility]) Diagnosis:   Patient Active Problem List   Diagnosis Date Noted  . Skin lesions [L98.9] 07/04/2015  . Prediabetes [R73.03] 07/03/2015  . Bipolar disorder, current episode manic severe with psychotic features ([place]) [F31.2] 06/30/2015  . History of breast cancer in female [Z85.3] 04/10/2015  . Irreducible ventral hernia [K46.0] 04/12/2013  . HTN (hypertension) [I10]   . COPD (chronic obstructive pulmonary disease) ([place]) [J44.9]   . Chronic kidney disease [N18.9]   . Hyperlipidemia [E78.5]   . Hypothyroidism [E03.9]   . Bipolar affective ([place])  [F31.9]   . Anemia in chronic kidney disease(285.21) [N03.9, D63.1] 07/10/2011  . Thrombocytopenia ([place]) [D69.6] 07/10/2011  . BIPOLAR AFFECTIVE DISORDER [F31.9] 08/15/2006  . SMOKER [F17.200] 08/15/2006  . COPD [J44.9] 08/15/2006  . BOILS, RECURRENT [L02.92, L02.93] 08/15/2006  . ACNE ROSACEA [L71.9] 08/15/2006  . ACNE NEC [L70.8] 08/15/2006  . EDEMA [R60.9] 08/15/2006  . INCONTINENCE, URGE [N39.41] 08/15/2006   Total Time spent with patient: 30 minutes  Past Psychiatric History: Pt with hx of Bipolar disorder , several hospitalizations in the past. Pt unable to provide further details . Will work on obtaining collateral information.   Past Medical History:  Past Medical History  Diagnosis Date  . Anemia 07/10/2011  . Thrombocytopenia ([place]) 07/10/2011  . HTN (hypertension)   . COPD (chronic obstructive pulmonary disease) ([place])   . Hyperlipidemia   . Hypothyroidism   . Bipolar affective ([place])   . DM (diabetes mellitus) ([place])   . Renal insufficiency   . 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 Hypertension     Past Surgical History  Procedure Laterality Date  . Ankle surgery Left 1989  . Tonsillectomy  AGE 85  . Breast surgery Right 2010    BREAST  . Ventral hernia repair N/A 06/06/2013    Procedure:  OPEN VENTRAL HERNIA REPAIR;  Surgeon: Imogene Burn. Georgette Dover, MD;  Location: [facility] ORS;  Service: General;  Laterality: N/A;  .  Insertion of mesh N/A 06/06/2013    Procedure: INSERTION OF MESH;  Surgeon: Imogene Burn. Georgette Dover, MD;  Location: [facility] ORS;  Service: General;  Laterality: N/A;   Family History:  Family History  Problem Relation Age of Onset  . Diabetes Mother   . Cancer Father   . HIV Brother    Family Psychiatric  History: Pt unable to provide details about family hx . Social History: Pt reports she is divorced and lives with a room mate .  History  Alcohol Use No     History  Drug Use No    Social History   Social History  . Marital Status:  Divorced    Spouse Name: N/A  . Number of Children: N/A  . Years of Education: N/A   Social History Main Topics  . Smoking status: Current Every Day Smoker -- 0.50 packs/day for 40 years    Types: Cigarettes, Cigars  . Smokeless tobacco: Never Used  . Alcohol Use: No  . Drug Use: No  . Sexual Activity: Not Asked   Other Topics Concern  . None   Social History Narrative   Additional Social History:    History of alcohol / drug use?: No history of alcohol / drug abuse (unable to assess)  Sleep: Poor-  Appetite:   Improved   Current Medications: Current Facility-Administered Medications  Medication Dose Route Frequency Provider Last Rate Last Dose  . acetaminophen (TYLENOL) tablet 650 mg  650 mg Oral Q4H PRN Lurena Nida, NP   650 mg at 07/09/15 1344  . albuterol (PROVENTIL HFA;VENTOLIN HFA) 108 (90 BASE) MCG/ACT inhaler 1-2 puff  1-2 puff Inhalation Q6H PRN Lurena Nida, NP   2 puff at 07/01/15 1924  . alum & mag hydroxide-simeth (MAALOX/MYLANTA) 200-200-20 MG/5ML suspension 30 mL  30 mL Oral PRN Lurena Nida, NP   30 mL at 07/09/15 0428  . ARIPiprazole (ABILIFY) tablet 15 mg  15 mg Oral BH-qamhs Dajon Rowe, MD       Or  . haloperidol lactate (HALDOL) injection 5 mg  5 mg Intramuscular BH-qamhs Brown Dunlap, MD      . ARIPiprazole SUSR 400 mg  400 mg Intramuscular Q28 days Ursula Alert, MD      . atorvastatin (LIPITOR) tablet 40 mg  40 mg Oral QHS Lurena Nida, NP   40 mg at 07/09/15 2200  . benztropine (COGENTIN) tablet 0.5 mg  0.5 mg Oral BID Ursula Alert, MD   0.5 mg at 07/10/15 1046   Or  . benztropine mesylate (COGENTIN) injection 0.5 mg  0.5 mg Intramuscular BID Seymore Brodowski, MD      . carvedilol (COREG) tablet 3.125 mg  3.125 mg Oral q morning - 10a Lurena Nida, NP   3.125 mg at 07/10/15 0915  . chlorpheniramine-HYDROcodone (TUSSIONEX) 10-8 MG/5ML suspension 5 mL  5 mL Oral Q12H PRN Lurena Nida, NP   5 mL at 07/08/15 0030  . diphenhydrAMINE  (BENADRYL) capsule 50 mg  50 mg Oral Q6H PRN Ursula Alert, MD       Or  . diphenhydrAMINE (BENADRYL) injection 50 mg  50 mg Intramuscular Q6H PRN Nassim Cosma, MD      . divalproex (DEPAKOTE) DR tablet 500 mg  500 mg Oral [id number] Ursula Alert, MD   500 mg at 07/10/15 0751  . divalproex (DEPAKOTE) DR tablet 750 mg  750 mg Oral QHS Jamesrobert Ohanesian, MD   750 mg at 07/09/15 2200  . feeding supplement (  GLUCERNA SHAKE) (GLUCERNA SHAKE) liquid 237 mL  237 mL Oral TID BM Odetta Forness, MD   237 mL at 07/10/15 0931  . gabapentin (NEURONTIN) capsule 400 mg  400 mg Oral [id number] Ursula Alert, MD   400 mg at 07/09/15 1509  . haloperidol (HALDOL) tablet 5 mg  5 mg Oral Q6H PRN Ursula Alert, MD   5 mg at 07/10/15 0106   Or  . haloperidol lactate (HALDOL) injection 5 mg  5 mg Intramuscular Q6H PRN Ursula Alert, MD      . hydrocortisone cream 1 %   Topical TID Ursula Alert, MD      . hydrOXYzine (ATARAX/VISTARIL) tablet 25 mg  25 mg Oral BID PRN Ursula Alert, MD      . hydrOXYzine (ATARAX/VISTARIL) tablet 50 mg  50 mg Oral QHS PRN Ursula Alert, MD      . levothyroxine (SYNTHROID, LEVOTHROID) tablet 75 mcg  75 mcg Oral QAC breakfast Lurena Nida, NP   75 mcg at 07/10/15 0645  . LORazepam (ATIVAN) tablet 1 mg  1 mg Oral Q8H PRN Ursula Alert, MD   1 mg at 07/05/15 0234   Or  . LORazepam (ATIVAN) injection 1 mg  1 mg Intramuscular Q8H PRN Ursula Alert, MD      . mometasone-formoterol (DULERA) 100-5 MCG/ACT inhaler 2 puff  2 puff Inhalation BID Lurena Nida, NP   2 puff at 07/10/15 0750  . multivitamin with minerals tablet 1 tablet  1 tablet Oral Daily Ursula Alert, MD   1 tablet at 07/10/15 0751  . neomycin-bacitracin-polymyxin (NEOSPORIN) ointment   Topical BID Zyon Grout, MD      . nicotine (NICODERM CQ - dosed in mg/24 hours) patch 21 mg  21 mg Transdermal Daily Benjamine Mola, FNP   21 mg at 07/04/15 0817  . ramipril (ALTACE) capsule 2.5 mg  2.5 mg Oral q morning - 10a Lurena Nida, NP   2.5 mg at 07/10/15 0915  . zolpidem (AMBIEN) tablet 10 mg  10 mg Oral QHS Ursula Alert, MD   10 mg at 07/09/15 2200    Lab Results:  No results found for this or any previous visit (from the past 48 hour(s)).  Physical Findings: AIMS: Facial and Oral Movements Muscles of Facial Expression: None, normal Lips and Perioral Area: None, normal Jaw: None, normal Tongue: None, normal,Extremity Movements Upper (arms, wrists, hands, fingers): None, normal Lower (legs, knees, ankles, toes): None, normal, Trunk Movements Neck, shoulders, hips: None, normal, Overall Severity Severity of abnormal movements (highest score from questions above): None, normal Incapacitation due to abnormal movements: None, normal Patient's awareness of abnormal movements (rate only patient's report): No Awareness, Dental Status Current problems with teeth and/or dentures?: No Does patient usually wear dentures?: Yes  CIWA:  CIWA-Ar Total: 3 COWS:  COWS Total Score: 2  Musculoskeletal: Strength & Muscle Tone: within normal limits Gait & Station: normal Patient leans: N/A  Psychiatric Specialty Exam: Review of Systems  Musculoskeletal: Positive for back pain.  Skin:       Lesions - ?s/p bug bites of due to itching   Psychiatric/Behavioral: The patient is nervous/anxious and has insomnia.   All other systems reviewed and are negative.   Blood pressure 131/77, pulse 79, temperature 97.6 F (36.4 C), temperature source Oral, resp. rate 20, height 5' 5"  (1.651 m), weight 94.348 kg (208 lb), SpO2 95 %.Body mass index is 34.61 kg/(m^2).  General Appearance: Fairly Groomed  Engineer, water::  Good  Speech:  Still pressured   Volume:  Increased  Mood:irritable  Affect: labile    Thought Process:  Disorganized and Tangential   Orientation: oriented to person, place and situation  Thought Content:   At present denies hallucinations, but appears paranoid , thinks staff here is lying about her , she  believes she is not a patient here and she came here to work  Suicidal Thoughts:  No- denies any suicidal or self injurious ideations, contracts for safety on unit   Homicidal Thoughts:  No  Memory:  Immediate;   Fair Recent;   Poor Remote;   Poor  Judgement:  Fair  Insight:  Lacking  Psychomotor Activity:  Normal  Concentration:  Poor  Recall:  [place] of Knowledge:Fair  Language: Fair  Akathisia:  No  Handed:  Right  AIMS (if indicated):     Assets:  Others:  access to health care  ADL's:  Improving   Cognition: WNL  Sleep:  Number of Hours: 3.75   Assessment - Patient continues to be very disorganized , refusing some of her PO medications. Pt seen by Dr.Lugo who has agreed to forced medications . Will continue treatment.   Treatment Plan Summary: Daily contact with patient to assess and evaluate symptoms and progress in treatment and Medication management  Continue to encourage group participation , milieu Will continue Depakote DR 500 mg BID and  750 mg po qhs  for mood disorder .Depakote level on 07/08/15- 58 ug/ml. Will get another level in 5 days ( 07/14/15) Continue Abilify 15 mg  BID for psychosis, mood disorder . Will add Haldol 5 mg IM BID - as forced medication - if she refuses her Abilify PO. Will also provide Abilify Maintena IM 400 mg q28 days as a LAI , due to patient's noncompliance with her medications on the unit. Will continue her on Ambien 10 mg po qhs. Will also make available Vistaril 50 mg po qhs prn for sleep . Continue  Gabapentin 400 mg po tid to augment the effect of Depakote for mood lability. Continue Haldol 5 mg po /IM prn , Benadryl 50 mg PO/IM prn ,Ativan 1 mg PO/IM prn for severe anxiety/agitation. Treatment team working on disposition planning, at this time plan is for patient to return to her [place] after discharge .   I certify that the services received since the previous certification/recertification were and continue to be medically  necessary as the treatment provided can be reasonably expected to improve the patient's condition; the medical record documents that the services furnished were intensive treatment services or their equivalent services, and this patient continues to need, on a daily basis, active treatment furnished directly by or requiring the supervision of inpatient psychiatric personnel.     Nacole Fluhr MD 07/10/2015, 12:01 PM

## 2015-07-10 NOTE — BHH Group Notes (Signed)
[place] LCSW Group Therapy  07/10/2015 1:15 pm  Type of Therapy: Process Group Therapy  Participation Level:  Active  Participation Quality:  Appropriate  Affect:  Flat  Cognitive:  Oriented  Insight:  Improving  Engagement in Group:  Limited  Engagement in Therapy:  Limited  Modes of Intervention:  Activity, Clarification, Education, Problem-solving and Support  Summary of Progress/Problems: Today's group addressed the issue of overcoming obstacles.  Patients were asked to identify their biggest obstacle post d/c that stands in the way of their on-going success, and then problem solve as to how to manage this.  Stayed the whole time.  Quiet.  Sleeping for part of the time.  Put up her hand to ask permission to talk.  Offered help/resources to other patients.  Talked about her [place] to one patient and her church to another.  Cleopatra Cedar the praises of [employer] day program and invited everyone to join her there.  Also talked about her plans "to go to college and get a full time job."  [name] 07/10/2015   1:26 PM

## 2015-07-10 NOTE — BHH Group Notes (Signed)
[place] Group Notes:  (Nursing/MHT/Case Management/Adjunct)  Date:  07/10/2015  Time: 0930 Type of Therapy:  Nurse Education  Participation Level:  Active  Participation Quality:  Appropriate and Attentive  Affect:  Appropriate and Excited  Cognitive:  Alert and Appropriate  Insight:  Appropriate, Good and Improving  Engagement in Group:  Engaged and Improving  Modes of Intervention:  Discussion, Education and Exploration  Summary of Progress/Problems: Topic was on leisure and lifestyle changes.  Discussed the importance of having a good support system and engaging in  a positive leisure activities.  Patient was receptive, supportive and participated.  Patient states that her new coping skills is listening and allowing people to talk without interrupting. Mart Piggs 07/10/2015, 7:02 PM

## 2015-07-10 NOTE — Progress Notes (Signed)
Patient ID: [name], female   DOB: Jan 21, 1961, 54 y.o.   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 Client rambles on and on. A:Writer provided emotional support, redirected as needed. Client took some of her medications, refused gabapentin and Ambien "no honey I can't take that it will make me sick as a Pension scheme manager will monitor q38min for safety. R: client is safe on the unit, attended group.

## 2015-07-10 NOTE — Progress Notes (Signed)
DAR NOTE: Patient presents with anxious mood and affect.  Denies pain, auditory and visual hallucinations.  Rates depression at 0, hopelessness at 0, and anxiety at 0.  Maintained on routine safety checks.  Medications given as prescribed.  Support and encouragement offered as needed.  Attended group and participated.  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

## 2015-07-11 NOTE — Progress Notes (Signed)
DAR NOTE: Patient is calm and pleasant. Denies pain, auditory and visual hallucinations.  Rates depression at 0, hopelessness at 0, and anxiety at 0  Maintained on routine safety checks.  Medications given as prescribed.  Support and encouragement offered as needed.  Attended group and participated.  States goal for today is "go home, sell [place], and have a great life."  Patient observed socializing with peers in the dayroom.  Patient ambulating on the unit without difficulty.

## 2015-07-11 NOTE — Tx Team (Signed)
Progress in Treatment:  Attending groups: Yes Participating in groups: Yes, unfortunately Taking medication as prescribed:Yes  Finally agreeing to take all prescribed meds with threat of injection Tolerating medication: Yes  Family/Significant other contact made: Yes [name] of group home Patient understands diagnosis: No Limited insight Discussing patient identified problems/goals with staff: Yes  Medical problems stabilized or resolved: Yes  Denies suicidal/homicidal ideation: Yes Patient has not harmed self or Others: Yes   New problem(s) identified: None identified at this time.   Discharge Plan or Barriers: Per Pt, she is from [facility]; will return there at DC. Follow up [facility]  Additional comments: Meds started: Abilify 32m, Depakote 5026m  07/08/15: Abilify and Depakote have both been increased. Poor sleep the last 2 days-will address with meds.  07/10/15 Client is visible on the unit, has a visitor today "that's the maintenance man where I lived, him and his wife good to me" Client is attentive and calm as he visits. [place] is intrusive at times, still tangible. A: Writer redirects client as needed, encourages client to take medication. Staff will monitor q1556mfor safety. R: client is safe on the unit, attended karaoke, sang a song.            07/11/15: Pt is med compliant and is getting adequate sleep.   Reason for Continuation of Hospitalization:  Medication stabilization Mania Psychosis  Estimated length of stay: 2-3 days  Review of initial/current patient goals per problem list:  1. Goal(s): Patient will participate in aftercare plan  Met:Yes  Target date: 3-5 days from date of admission  As evidenced by: Patient will participate within aftercare plan AEB aftercare provider and housing plan at discharge being identified.  06/30/15: [facility]. Goal(s): Patient will demonstrate decreased signs of psychosis   MetEML:JQGBEEFEOFHarget date: 3-5 days from date of admission  As evidenced by: Patient will demonstrate decreased frequency of AVH or return to baseline function 06/30/15: Pt continues to be actively psychotic; observed to be responding to internal stimuli, delusional thought content 07/08/15: Today pt presents with paranoia, delusions. Refusing meds because she is allergic to all of them,and we are trying to "poison" her 07/11/15: Pt denies AVH. Pt is compliant with meds and team is working to get her stabilized.  07/03/15: Pt denies AVH and is exhibiting more appropriate behavior. 6. Goal (s): Patient will demonstrate decreased signs of mania  Met: Progressing  Target date: 3-5 days from date of admission  As evidenced by: Patient demonstrate decreased signs of mania AEB decreased mood instability and demonstration of stable mood 06/30/15: Pt continues to exhibit pressured speech, delusional and disorganized thought process, with labile mood.  07/03/15: Pt is still exhibiting pressured speech and flight of ideas. However, pt's impulse control seems to be improving as evidenced by pt raising her hand to speak while in group instead of interrupting. 07/08/15: Mood is stable. Sleep is poor, and she continues to present with pressured speech and flight of ideas. 07/11/15: Pt is med compliant after being told she would be injected with medications if she does not comply. Her sleep is improving. Pt still exhibits pressured sleep and flight of ideas but is showing an increase in impulse control.   Attendees:  Patient:    Family:    Physician: Dr EapShea Evans0/31/2016 1:18 PM  Nursing:  Lars Pinks, RN Case manager  06/30/2015 1:18 PM   Clinical Social Worker [place] 06/30/2015 1:18 PM  Other:  06/30/2015 1:18 PM  Clinical: Hedy Jacob 06/30/2015 1:18 PM  Other: , RN Charge Nurse 06/30/2015 1:18 PM  Other:     Peri Maris, Latanya Presser MSW

## 2015-07-11 NOTE — Progress Notes (Signed)
Patient ID: [name], female   DOB: 02-13-1961, 54 y.o.   MRN: [id number] D: Client is visible on the unit, has a visitor today "that's the maintenance man where I lived, him and his wife good to me" Client is attentive and calm as he visits. Client excited about going to [place]. Client is intrusive at times, still tangible. A: Writer redirects client as needed, encourages client to take medication. Staff will monitor q21min for safety. R: client is safe on the unit, attended karaoke, sang a song.

## 2015-07-11 NOTE — Progress Notes (Signed)
Patient ID: [name], female   DOB: Sep 06, 1960, 54 y.o.   MRN: [id number] [facility] MD Progress Note  07/11/2015 2:43 PM MOSELLA KASA  MRN:  [id number] Subjective: patient states " I am fine , I took my medications last night and I slept. My brother used to be a veteran , can I go home , its veterans day.'      Objective:Patient is a 13 y old CF who has a hx of Bipolar disorder , as well as multiple medical comorbidities , who presented to [facility] by EMS , for nausea after smoking a cigarette. Patient per initial notes in EHR appeared to be disorganized , delusional in the ED.  I have discussed case with treatment team and have met with patient.Pt today continues to be pressured ,however she is improved than yesterday. Pt is more redirectable , her thoughts are more linear and her speech more easy to understand. Pt per nursing slept .4 hrs last night which is an improvement for her .Pt also has been taking her PO medications now that she is on Forced medications.  Pt has had no observed ADRs , none reported by patient. Pt is more appropriate , less labile. Will continue to encourage and support.        Principal Problem: Bipolar disorder, current episode manic severe with psychotic features [facility]) Diagnosis:   Patient Active Problem List   Diagnosis Date Noted  . Skin lesions [L98.9] 07/04/2015  . Prediabetes [R73.03] 07/03/2015  . Bipolar disorder, current episode manic severe with psychotic features ([place]) [F31.2] 06/30/2015  . History of breast cancer in female [Z85.3] 04/10/2015  . Irreducible ventral hernia [K46.0] 04/12/2013  . HTN (hypertension) [I10]   . COPD (chronic obstructive pulmonary disease) ([place]) [J44.9]   . Chronic kidney disease [N18.9]   . Hyperlipidemia [E78.5]   . Hypothyroidism [E03.9]   . Bipolar affective ([place]) [F31.9]   . Anemia in chronic kidney disease(285.21) [N03.9, D63.1] 07/10/2011  . Thrombocytopenia ([place]) [D69.6] 07/10/2011  . BIPOLAR  AFFECTIVE DISORDER [F31.9] 08/15/2006  . SMOKER [F17.200] 08/15/2006  . COPD [J44.9] 08/15/2006  . BOILS, RECURRENT [L02.92, L02.93] 08/15/2006  . ACNE ROSACEA [L71.9] 08/15/2006  . ACNE NEC [L70.8] 08/15/2006  . EDEMA [R60.9] 08/15/2006  . INCONTINENCE, URGE [N39.41] 08/15/2006   Total Time spent with patient: 30 minutes  Past Psychiatric History: Pt with hx of Bipolar disorder , several hospitalizations in the past. Pt unable to provide further details . Will work on obtaining collateral information.   Past Medical History:  Past Medical History  Diagnosis Date  . Anemia 07/10/2011  . Thrombocytopenia ([place]) 07/10/2011  . HTN (hypertension)   . COPD (chronic obstructive pulmonary disease) ([place])   . Hyperlipidemia   . Hypothyroidism   . Bipolar affective ([place])   . DM (diabetes mellitus) ([place])   . Renal insufficiency   . 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 Hypertension     Past Surgical History  Procedure Laterality Date  . Ankle surgery Left 1989  . Tonsillectomy  AGE 14  . Breast surgery Right 2010    BREAST  . Ventral hernia repair N/A 06/06/2013    Procedure:  OPEN VENTRAL HERNIA REPAIR;  Surgeon: Imogene Burn. Georgette Dover, MD;  Location: [facility] ORS;  Service: General;  Laterality: N/A;  . Insertion of mesh N/A 06/06/2013    Procedure: INSERTION OF MESH;  Surgeon: Imogene Burn. Georgette Dover, MD;  Location: [facility] ORS;  Service: General;  Laterality: N/A;   Family History:  Family History  Problem Relation Age of Onset  . Diabetes Mother   . Cancer Father   . HIV Brother    Family Psychiatric  History: Pt unable to provide details about family hx . Social History: Pt reports she is divorced and lives with a room mate .  History  Alcohol Use No     History  Drug Use No    Social History   Social History  . Marital Status: Divorced    Spouse Name: N/A  . Number of Children: N/A  . Years of Education: N/A   Social History Main Topics  . Smoking status:  Current Every Day Smoker -- 0.50 packs/day for 40 years    Types: Cigarettes, Cigars  . Smokeless tobacco: Never Used  . Alcohol Use: No  . Drug Use: No  . Sexual Activity: Not Asked   Other Topics Concern  . None   Social History Narrative   Additional Social History:    History of alcohol / drug use?: No history of alcohol / drug abuse (unable to assess)  Sleep: Fair-  Appetite:   Improved   Current Medications: Current Facility-Administered Medications  Medication Dose Route Frequency Provider Last Rate Last Dose  . acetaminophen (TYLENOL) tablet 650 mg  650 mg Oral Q4H PRN Lurena Nida, NP   650 mg at 07/09/15 1344  . albuterol (PROVENTIL HFA;VENTOLIN HFA) 108 (90 BASE) MCG/ACT inhaler 1-2 puff  1-2 puff Inhalation Q6H PRN Lurena Nida, NP   2 puff at 07/01/15 1924  . alum & mag hydroxide-simeth (MAALOX/MYLANTA) 200-200-20 MG/5ML suspension 30 mL  30 mL Oral PRN Lurena Nida, NP   30 mL at 07/09/15 0428  . ARIPiprazole (ABILIFY) tablet 15 mg  15 mg Oral BH-qamhs Ercel Normoyle, MD   15 mg at 07/11/15 0802   Or  . haloperidol lactate (HALDOL) injection 5 mg  5 mg Intramuscular BH-qamhs Dashaun Onstott, MD      . ARIPiprazole SUSR 400 mg  400 mg Intramuscular Q28 days Ursula Alert, MD   400 mg at 07/10/15 1512  . atorvastatin (LIPITOR) tablet 40 mg  40 mg Oral QHS Lurena Nida, NP   40 mg at 07/10/15 2144  . benztropine (COGENTIN) tablet 0.5 mg  0.5 mg Oral BID Ursula Alert, MD   0.5 mg at 07/11/15 0803   Or  . benztropine mesylate (COGENTIN) injection 0.5 mg  0.5 mg Intramuscular BID Yan Okray, MD      . carvedilol (COREG) tablet 3.125 mg  3.125 mg Oral q morning - 10a Lurena Nida, NP   3.125 mg at 07/11/15 1016  . chlorpheniramine-HYDROcodone (TUSSIONEX) 10-8 MG/5ML suspension 5 mL  5 mL Oral Q12H PRN Lurena Nida, NP   5 mL at 07/08/15 0030  . diphenhydrAMINE (BENADRYL) capsule 50 mg  50 mg Oral Q6H PRN Ursula Alert, MD       Or  . diphenhydrAMINE  (BENADRYL) injection 50 mg  50 mg Intramuscular Q6H PRN Tishia Maestre, MD      . divalproex (DEPAKOTE) DR tablet 500 mg  500 mg Oral [id number] Ursula Alert, MD   500 mg at 07/11/15 1411  . divalproex (DEPAKOTE) DR tablet 750 mg  750 mg Oral QHS Ursula Alert, MD   750 mg at 07/10/15 2144  . feeding supplement (GLUCERNA SHAKE) (GLUCERNA SHAKE) liquid 237 mL  237 mL Oral TID BM Keviana Guida, MD   237 mL  at 07/11/15 1412  . gabapentin (NEURONTIN) capsule 400 mg  400 mg Oral [id number] Ursula Alert, MD   400 mg at 07/11/15 1412  . haloperidol (HALDOL) tablet 5 mg  5 mg Oral Q6H PRN Ursula Alert, MD   5 mg at 07/10/15 0106   Or  . haloperidol lactate (HALDOL) injection 5 mg  5 mg Intramuscular Q6H PRN Ursula Alert, MD      . hydrocortisone cream 1 %   Topical TID Ursula Alert, MD      . hydrOXYzine (ATARAX/VISTARIL) tablet 25 mg  25 mg Oral BID PRN Ursula Alert, MD      . hydrOXYzine (ATARAX/VISTARIL) tablet 50 mg  50 mg Oral QHS PRN Ursula Alert, MD      . levothyroxine (SYNTHROID, LEVOTHROID) tablet 75 mcg  75 mcg Oral QAC breakfast Lurena Nida, NP   75 mcg at 07/11/15 9563  . LORazepam (ATIVAN) tablet 1 mg  1 mg Oral Q8H PRN Ursula Alert, MD   1 mg at 07/05/15 0234   Or  . LORazepam (ATIVAN) injection 1 mg  1 mg Intramuscular Q8H PRN Ursula Alert, MD      . mometasone-formoterol (DULERA) 100-5 MCG/ACT inhaler 2 puff  2 puff Inhalation BID Lurena Nida, NP   2 puff at 07/11/15 0802  . multivitamin with minerals tablet 1 tablet  1 tablet Oral Daily Ursula Alert, MD   1 tablet at 07/11/15 0802  . neomycin-bacitracin-polymyxin (NEOSPORIN) ointment   Topical BID Prentice Sackrider, MD      . nicotine (NICODERM CQ - dosed in mg/24 hours) patch 21 mg  21 mg Transdermal Daily Benjamine Mola, FNP   21 mg at 07/04/15 0817  . ramipril (ALTACE) capsule 2.5 mg  2.5 mg Oral q morning - 10a Lurena Nida, NP   2.5 mg at 07/11/15 1016  . zolpidem (AMBIEN) tablet 10 mg  10 mg Oral QHS Ursula Alert, MD   10 mg at 07/10/15 2150    Lab Results:  No results found for this or any previous visit (from the past 48 hour(s)).  Physical Findings: AIMS: Facial and Oral Movements Muscles of Facial Expression: None, normal Lips and Perioral Area: None, normal Jaw: None, normal Tongue: None, normal,Extremity Movements Upper (arms, wrists, hands, fingers): None, normal Lower (legs, knees, ankles, toes): None, normal, Trunk Movements Neck, shoulders, hips: None, normal, Overall Severity Severity of abnormal movements (highest score from questions above): None, normal Incapacitation due to abnormal movements: None, normal Patient's awareness of abnormal movements (rate only patient's report): No Awareness, Dental Status Current problems with teeth and/or dentures?: No Does patient usually wear dentures?: Yes  CIWA:  CIWA-Ar Total: 3 COWS:  COWS Total Score: 2  Musculoskeletal: Strength & Muscle Tone: within normal limits Gait & Station: normal Patient leans: N/A  Psychiatric Specialty Exam: Review of Systems  Musculoskeletal: Positive for back pain.  Skin:       Lesions - ?s/p bug bites - healing  Psychiatric/Behavioral: The patient is nervous/anxious.   All other systems reviewed and are negative.   Blood pressure 124/72, pulse 80, temperature 98.4 F (36.9 C), temperature source Oral, resp. rate 16, height 5' 5"  (1.651 m), weight 94.348 kg (208 lb), SpO2 95 %.Body mass index is 34.61 kg/(m^2).  General Appearance: Fairly Groomed  Engineer, water::  Good  Speech:  Still pressured   Volume:  Normal  Mood:anxious  Affect: labile  , improving  Thought Process:  Disorganized and Tangential improving  Orientation: oriented to person, place and situation  Thought Content:   At present denies hallucinations, less paranoid   Suicidal Thoughts:  No- denies any suicidal or self injurious ideations, contracts for safety on unit   Homicidal Thoughts:  No  Memory:  Immediate;    Fair Recent;   Poor Remote;   Poor  Judgement:  Fair  Insight:  Lacking  Psychomotor Activity:  Normal  Concentration:  Fair  Recall:  [place]: Fair  Akathisia:  No  Handed:  Right  AIMS (if indicated):     Assets:  Social Support Others:  access to health care  ADL's:  Improving   Cognition: WNL  Sleep:  Number of Hours: 4.75   Assessment - Patient today seen as less disorganized , more redirectable , has been taking her PO medications. Continues to be on  forced medications . Dr.Lugo has done a second opinion. Will continue treatment.   Treatment Plan Summary: Daily contact with patient to assess and evaluate symptoms and progress in treatment and Medication management  Continue to encourage group participation , milieu Will continue Depakote DR 500 mg BID and  750 mg po qhs  for mood disorder .Depakote level on 07/08/15- 58 ug/ml. Will get another level in 5 days ( 07/14/15) Continue Abilify 15 mg  Po BID for psychosis, mood disorder . Continue Haldol 5 mg IM BID if she refuses her PO medications. She has received her Abilify Maintena 400 mg IM q28 days - first dose on 07/10/15. Will continue her on Ambien 10 mg po qhs. Will also make available Vistaril 50 mg po qhs prn for sleep . Continue  Gabapentin 400 mg po tid to augment the effect of Depakote for mood lability. Continue Haldol 5 mg po /IM prn , Benadryl 50 mg PO/IM prn ,Ativan 1 mg PO/IM prn for severe anxiety/agitation. Treatment team working on disposition planning, at this time plan is for patient to return to her [facility] after discharge .   I certify that the services received since the previous certification/recertification were and continue to be medically necessary as the treatment provided can be reasonably expected to improve the patient's condition; the medical record documents that the services furnished were intensive treatment services or their equivalent services, and this  patient continues to need, on a daily basis, active treatment furnished directly by or requiring the supervision of inpatient psychiatric personnel.     Parveen Freehling MD 07/11/2015, 2:43 PM

## 2015-07-11 NOTE — Progress Notes (Signed)
The patient says that she had a great day today. Says that she had a visitor and it went well. 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 Patient appeared upbeat and positive.

## 2015-07-11 NOTE — BHH Group Notes (Signed)
[facility] LCSW Group Therapy   07/11/2015 1:29 PM  Type of Therapy: Group Therapy  Participation Level:  Active  Participation Quality:  Attentive  Affect:  Flat  Cognitive:  Oriented  Insight:  Limited  Engagement in Therapy:  Engaged  Modes of Intervention:  Discussion and Socialization  Summary of Progress/Problems: Chaplain was here to lead a group on themes of hope and/or courage.  Pt was alert and oriented throughout group. Pt continues to exhibit a disorganized thought process and is hard to follow. Pt spoke a lot about her fiance and how she is looking forward to getting married to him. "He has a diamond ring for me. I want to marry him and have that love". Pt exhibited much more self-control than usual during group and did not interrupt others.  Georga Kaufmann 07/11/2015 1:29 PM

## 2015-07-11 NOTE — BHH Group Notes (Signed)
[facility] LCSW Aftercare Discharge Planning Group Note   07/11/2015 10:29 AM  Participation Quality:  Active  Mood/Affect:  Appropriate  Depression Rating:  Pt denies   Anxiety Rating:  Pt denies   Thoughts of Suicide:  No Will you contract for safety?   NA  Current AVH:  No  Plan for Discharge/Comments: Pt denies all symptoms but is still exhibiting pressured speech and flight of ideas.  Pt reported that she took all of her meds and was able to get decent sleep at night. Pt is looking forward to being discharged and will return to a group home.  Transportation Means:   Supports:  Georga Kaufmann

## 2015-07-12 NOTE — Progress Notes (Signed)
D: Pt has been animated and pleasant this evening.  She reported that she met with "Ms. [name]" and that the meeting went well.  Pt reports she may be discharging soon.  She discussed her plans after discharge, stating "I'm getting ready to go to college and get my degree."  She is tangential and her speech is pressured and slurred.  Pt denies SI/HI, denies hallucinations, denies pain.  Pt has been visible in milieu interacting with peers and staff appropriately.  Pt attended evening group.   A: Met with pt 1:1 and provided support and encouragement.  Actively listened to pt.  Medications administered per order.  R: Pt is compliant with medications.  Pt verbally contracts for safety and reports that she will inform staff of her needs and concerns.  Will continue to monitor and assess.

## 2015-07-12 NOTE — Progress Notes (Signed)
DAR NOTE: Patient presents with anxious mood and affect.  Denies pain, auditory and visual hallucinations.  Rates depression at 0, hopelessness at 0, and anxiety at 0.  Maintained on routine safety checks.  Medications given as prescribed.  Support and encouragement offered as needed.  Attended group and participated.  States goal for today is "go home, live the good life."  Patient observed socializing with peers in the dayroom.  Offered no complaint.

## 2015-07-12 NOTE — Progress Notes (Signed)
Adult Psychoeducational Group Note  Date:  07/12/2015 Time:  9:04 PM  Group Topic/Focus:  Wrap-Up Group:   The focus of this group is to help patients review their daily goal of treatment and discuss progress on daily workbooks.  Participation Level:  Active  Participation Quality:  Appropriate  Affect:  Appropriate  Cognitive:  Appropriate  Insight: Appropriate  Engagement in Group:  Engaged  Modes of Intervention:  Discussion  Additional Comments:The patient expressed that she rates her day a 8 which was a good day.The patient also attended group.   Nash Shearer 07/12/2015, 9:04 PM

## 2015-07-12 NOTE — Progress Notes (Signed)
Patient ID: [name], female   DOB: March 13, 1961, 54 y.o.   MRN: [id number] [facility] MD Progress Note  07/12/2015 4:37 PM [name]  MRN:  [id number] Subjective: patient states " I am fine , I took all my medications today.'       Objective:Patient is a 54 y old CF who has a hx of Bipolar disorder , as well as multiple medical comorbidities , who presented to [facility] by EMS , for nausea after smoking a cigarette. Patient per initial notes in EHR appeared to be disorganized , delusional in the ED.  I have discussed case with treatment team and have met with patient.Pt today appears to be less pressured, more calm , less intrusive and restless. Pt has been tolerating her medications well. Pt per nursing has been able to sleep better at night , now that she is more compliant on her medications. Will continue to encourage and support.        Principal Problem: Bipolar disorder, current episode manic severe with psychotic features [facility]) Diagnosis:   Patient Active Problem List   Diagnosis Date Noted  . Skin lesions [L98.9] 07/04/2015  . Prediabetes [R73.03] 07/03/2015  . Bipolar disorder, current episode manic severe with psychotic features ([place]) [F31.2] 06/30/2015  . History of breast cancer in female [Z85.3] 04/10/2015  . Irreducible ventral hernia [K46.0] 04/12/2013  . HTN (hypertension) [I10]   . COPD (chronic obstructive pulmonary disease) ([place]) [J44.9]   . Chronic kidney disease [N18.9]   . Hyperlipidemia [E78.5]   . Hypothyroidism [E03.9]   . Bipolar affective ([place]) [F31.9]   . Anemia in chronic kidney disease(285.21) [N03.9, D63.1] 07/10/2011  . Thrombocytopenia ([place]) [D69.6] 07/10/2011  . BIPOLAR AFFECTIVE DISORDER [F31.9] 08/15/2006  . SMOKER [F17.200] 08/15/2006  . COPD [J44.9] 08/15/2006  . BOILS, RECURRENT [L02.92, L02.93] 08/15/2006  . ACNE ROSACEA [L71.9] 08/15/2006  . ACNE NEC [L70.8] 08/15/2006  . EDEMA [R60.9] 08/15/2006  . INCONTINENCE, URGE [N39.41]  08/15/2006   Total Time spent with patient: 25 minutes  Past Psychiatric History: Pt with hx of Bipolar disorder , several hospitalizations in the past. Pt unable to provide further details . Will work on obtaining collateral information.   Past Medical History:  Past Medical History  Diagnosis Date  . Anemia 07/10/2011  . Thrombocytopenia ([place]) 07/10/2011  . HTN (hypertension)   . COPD (chronic obstructive pulmonary disease) ([place])   . Hyperlipidemia   . Hypothyroidism   . Bipolar affective ([place])   . DM (diabetes mellitus) ([place])   . Renal insufficiency   . 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 Hypertension     Past Surgical History  Procedure Laterality Date  . Ankle surgery Left 1989  . Tonsillectomy  AGE 40  . Breast surgery Right 2010    BREAST  . Ventral hernia repair N/A 06/06/2013    Procedure:  OPEN VENTRAL HERNIA REPAIR;  Surgeon: Imogene Burn. Georgette Dover, MD;  Location: [facility] ORS;  Service: General;  Laterality: N/A;  . Insertion of mesh N/A 06/06/2013    Procedure: INSERTION OF MESH;  Surgeon: Imogene Burn. Georgette Dover, MD;  Location: [facility] ORS;  Service: General;  Laterality: N/A;   Family History:  Family History  Problem Relation Age of Onset  . Diabetes Mother   . Cancer Father   . HIV Brother    Family Psychiatric  History: Pt unable to provide details about family hx . Social History: Pt reports she is divorced and  lives with a room mate .  History  Alcohol Use No     History  Drug Use No    Social History   Social History  . Marital Status: Divorced    Spouse Name: N/A  . Number of Children: N/A  . Years of Education: N/A   Social History Main Topics  . Smoking status: Current Every Day Smoker -- 0.50 packs/day for 40 years    Types: Cigarettes, Cigars  . Smokeless tobacco: Never Used  . Alcohol Use: No  . Drug Use: No  . Sexual Activity: Not Asked   Other Topics Concern  . None   Social History Narrative   Additional Social  History:    History of alcohol / drug use?: No history of alcohol / drug abuse (unable to assess)  Sleep: Fair-  Appetite:   Improved   Current Medications: Current Facility-Administered Medications  Medication Dose Route Frequency Provider Last Rate Last Dose  . acetaminophen (TYLENOL) tablet 650 mg  650 mg Oral Q4H PRN Lurena Nida, NP   650 mg at 07/09/15 1344  . albuterol (PROVENTIL HFA;VENTOLIN HFA) 108 (90 BASE) MCG/ACT inhaler 1-2 puff  1-2 puff Inhalation Q6H PRN Lurena Nida, NP   2 puff at 07/01/15 1924  . alum & mag hydroxide-simeth (MAALOX/MYLANTA) 200-200-20 MG/5ML suspension 30 mL  30 mL Oral PRN Lurena Nida, NP   30 mL at 07/09/15 0428  . ARIPiprazole (ABILIFY) tablet 15 mg  15 mg Oral BH-qamhs Jomari Bartnik, MD   15 mg at 07/12/15 0810   Or  . haloperidol lactate (HALDOL) injection 5 mg  5 mg Intramuscular BH-qamhs Watt Geiler, MD      . ARIPiprazole SUSR 400 mg  400 mg Intramuscular Q28 days Ursula Alert, MD   400 mg at 07/10/15 1512  . atorvastatin (LIPITOR) tablet 40 mg  40 mg Oral QHS Lurena Nida, NP   40 mg at 07/11/15 2102  . benztropine (COGENTIN) tablet 0.5 mg  0.5 mg Oral BID Ursula Alert, MD   0.5 mg at 07/12/15 0810   Or  . benztropine mesylate (COGENTIN) injection 0.5 mg  0.5 mg Intramuscular BID Jezelle Gullick, MD      . carvedilol (COREG) tablet 3.125 mg  3.125 mg Oral q morning - 10a Lurena Nida, NP   3.125 mg at 07/12/15 1031  . chlorpheniramine-HYDROcodone (TUSSIONEX) 10-8 MG/5ML suspension 5 mL  5 mL Oral Q12H PRN Lurena Nida, NP   5 mL at 07/08/15 0030  . diphenhydrAMINE (BENADRYL) capsule 50 mg  50 mg Oral Q6H PRN Ursula Alert, MD       Or  . diphenhydrAMINE (BENADRYL) injection 50 mg  50 mg Intramuscular Q6H PRN Zaria Taha, MD      . divalproex (DEPAKOTE) DR tablet 500 mg  500 mg Oral [id number] Ursula Alert, MD   500 mg at 07/12/15 1548  . divalproex (DEPAKOTE) DR tablet 750 mg  750 mg Oral QHS Ursula Alert, MD   750 mg at  07/11/15 2103  . feeding supplement (GLUCERNA SHAKE) (GLUCERNA SHAKE) liquid 237 mL  237 mL Oral TID BM Weda Baumgarner, MD   237 mL at 07/12/15 1400  . gabapentin (NEURONTIN) capsule 400 mg  400 mg Oral [id number] Maanav Kassabian, MD   400 mg at 07/12/15 1548  . haloperidol (HALDOL) tablet 5 mg  5 mg Oral Q6H PRN Ursula Alert, MD   5 mg at 07/10/15 0106   Or  .  haloperidol lactate (HALDOL) injection 5 mg  5 mg Intramuscular Q6H PRN Ursula Alert, MD      . hydrocortisone cream 1 %   Topical TID Ursula Alert, MD      . hydrOXYzine (ATARAX/VISTARIL) tablet 25 mg  25 mg Oral BID PRN Ursula Alert, MD      . hydrOXYzine (ATARAX/VISTARIL) tablet 50 mg  50 mg Oral QHS PRN Ursula Alert, MD      . levothyroxine (SYNTHROID, LEVOTHROID) tablet 75 mcg  75 mcg Oral QAC breakfast Lurena Nida, NP   75 mcg at 07/12/15 1884  . LORazepam (ATIVAN) tablet 1 mg  1 mg Oral Q8H PRN Ursula Alert, MD   1 mg at 07/12/15 0123   Or  . LORazepam (ATIVAN) injection 1 mg  1 mg Intramuscular Q8H PRN Ursula Alert, MD      . mometasone-formoterol (DULERA) 100-5 MCG/ACT inhaler 2 puff  2 puff Inhalation BID Lurena Nida, NP   2 puff at 07/12/15 0810  . multivitamin with minerals tablet 1 tablet  1 tablet Oral Daily Ursula Alert, MD   1 tablet at 07/12/15 0810  . neomycin-bacitracin-polymyxin (NEOSPORIN) ointment   Topical BID Shelbie Franken, MD      . nicotine (NICODERM CQ - dosed in mg/24 hours) patch 21 mg  21 mg Transdermal Daily Benjamine Mola, FNP   21 mg at 07/04/15 0817  . ramipril (ALTACE) capsule 2.5 mg  2.5 mg Oral q morning - 10a Lurena Nida, NP   2.5 mg at 07/12/15 1032  . zolpidem (AMBIEN) tablet 10 mg  10 mg Oral QHS Ursula Alert, MD   10 mg at 07/11/15 2103    Lab Results:  No results found for this or any previous visit (from the past 48 hour(s)).  Physical Findings: AIMS: Facial and Oral Movements Muscles of Facial Expression: None, normal Lips and Perioral Area: None, normal Jaw:  None, normal Tongue: None, normal,Extremity Movements Upper (arms, wrists, hands, fingers): None, normal Lower (legs, knees, ankles, toes): None, normal, Trunk Movements Neck, shoulders, hips: None, normal, Overall Severity Severity of abnormal movements (highest score from questions above): None, normal Incapacitation due to abnormal movements: None, normal Patient's awareness of abnormal movements (rate only patient's report): No Awareness, Dental Status Current problems with teeth and/or dentures?: No Does patient usually wear dentures?: Yes  CIWA:  CIWA-Ar Total: 3 COWS:  COWS Total Score: 2  Musculoskeletal: Strength & Muscle Tone: within normal limits Gait & Station: normal Patient leans: N/A  Psychiatric Specialty Exam: Review of Systems  Musculoskeletal: Positive for back pain.  Skin:       Lesions - ?s/p bug bites - healing  Psychiatric/Behavioral: The patient is nervous/anxious.   All other systems reviewed and are negative.   Blood pressure 124/74, pulse 90, temperature 97.6 F (36.4 C), temperature source Oral, resp. rate 16, height _0  (1.651 m), weight 94.348 kg (208 lb), SpO2 95 %.Body mass index is 34.61 kg/(m^2).  General Appearance: Fairly Groomed  Engineer, water::  Good  Speech:  Still pressured , but improving  Volume:  Normal  Mood:anxious  Affect: labile  , improving  Thought Process:  Disorganized and Tangential improving  Orientation: oriented to person, place and situation  Thought Content:   At present denies hallucinations, less paranoid   Suicidal Thoughts:  No- denies any suicidal or self injurious ideations, contracts for safety on unit   Homicidal Thoughts:  No  Memory:  Immediate;   Fair Recent;  Poor Remote;   Poor  Judgement:  Fair  Insight:  Lacking  Psychomotor Activity:  Normal  Concentration:  Fair  Recall:  [place]: Fair  Akathisia:  No  Handed:  Right  AIMS (if indicated):     Assets:  Social  Support Others:  access to health care  ADL's:  Improving   Cognition: WNL  Sleep:  Number of Hours: 6   Assessment - Patient today seen as less disorganized , more redirectable , has been taking her PO medications. Continues to be on  forced medications . Dr.Lugo has done a second opinion. Will continue treatment.   Treatment Plan Summary: Daily contact with patient to assess and evaluate symptoms and progress in treatment and Medication management  Continue to encourage group participation , milieu Will continue Depakote DR 500 mg BID and  750 mg po qhs  for mood disorder .Depakote level on 07/08/15- 58 ug/ml. Will get another level in 5 days ( 07/14/15) Continue Abilify 15 mg  Po BID for psychosis, mood disorder . Continue Haldol 5 mg IM BID if she refuses her PO medications. She has received her Abilify Maintena 400 mg IM q28 days - first dose on 07/10/15. Will continue her on Ambien 10 mg po qhs. Will also make available Vistaril 50 mg po qhs prn for sleep . Continue  Gabapentin 400 mg po tid to augment the effect of Depakote for mood lability. Continue Haldol 5 mg po /IM prn , Benadryl 50 mg PO/IM prn ,Ativan 1 mg PO/IM prn for severe anxiety/agitation. Treatment team working on disposition planning, at this time plan is for patient to return to her [place] after discharge .    Lamerle Jabs MD 07/12/2015, 4:37 PM

## 2015-07-12 NOTE — B[facility] Group Notes (Signed)
[place] Group Notes:  (Clinical Social Work)  07/12/2015  11:15-12:00PM  Summary of Progress/Problems:   Today's process group involved patients discussing their feelings related to being hospitalized, as well as how they want to feel in order to be ready to discharge.  It was agreed in general by the group that it would be preferable to avoid future hospitalizations, and there was a discussion about what each person will need to do to achieve that.   Problems related to adherence to medication recommendations were discussed, as well as importance of developing friendships and supports.  The patient expressed her primary feeling about being hospitalized is that she is ready to go home.  She was intrusive and monopolizing and not on topic the remainder of the group time.  Type of Therapy:  Group Therapy - Process  Participation Level:  Active  Participation Quality:  Intrusive, Monopolizing and Resistant  Affect:  Flat and Irritable  Cognitive:  Disorganized  Insight:  Limited  Engagement in Therapy:  Limited  Modes of Intervention:  Exploration, Discussion  Selmer Dominion, LCSW 07/12/2015, 1:08 PM

## 2015-07-12 NOTE — Plan of Care (Signed)
Problem: Alteration in thought process Goal: LTG-Patient verbalizes understanding importance med regimen (Patient verbalizes understanding of importance of medication regimen and need to continue outpatient care.)  Outcome: Progressing Patient is compliant with medication regime and verbalizes the importance of medication adherence.

## 2015-07-13 MED ORDER — FLUCONAZOLE 150 MG PO TABS
150.0000 mg | ORAL_TABLET | Freq: Once | ORAL | Status: AC
  Administered 2015-07-13: 150 mg via ORAL
  Filled 2015-07-13: qty 1

## 2015-07-13 MED ORDER — NYSTATIN 100000 UNIT/GM EX OINT
TOPICAL_OINTMENT | Freq: Two times a day (BID) | CUTANEOUS | Status: DC
  Administered 2015-07-13 – 2015-07-14 (×3): via TOPICAL
  Filled 2015-07-13 (×2): qty 15

## 2015-07-13 NOTE — B[facility] Group Notes (Signed)
[place] Group Notes:  (Nursing/MHT/Case Management/Adjunct)  Date:  07/13/2015  Time:  11:55 AM  Type of Therapy:  Nurse Education  Participation Level:  Active  Participation Quality:  Appropriate and Attentive  Affect:  Appropriate  Cognitive:  Alert and Appropriate  Insight:  Appropriate, Good and Improving  Engagement in Group:  Engaged and Improving  Modes of Intervention:  Activity, Discussion, Education and Exploration  Summary of Progress/Problems: Group topic today was [employer]. Discussed healthy and not healthy support people. Discussed making a list of healthy support people and utilize during times of stress. Patient states goal for today is "get out of here as soon as possible."  Mart Piggs 07/13/2015, 11:55 AM

## 2015-07-13 NOTE — Progress Notes (Signed)
DAR NOTE: Patient presents with anxious affect and depressed mood.  Denies pain, auditory and visual hallucinations.  Rates depression at 0, hopelessness at 0, and anxiety at 0.  Maintained on routine safety checks.  Medications given as prescribed.  Support and encouragement offered as needed.  Attended group and participated.  States goal for today is "getting home, see fiancee, and get married."  Patient observed socializing with peers in the dayroom.  Excoriation noted between abdominal folds and groin area.  Antibiotic therapy initiated.  Nystatin ointment applied.

## 2015-07-13 NOTE — [facility] Group Notes (Signed)
[place] Group Notes:  (Clinical Social Work)  07/13/2015  [place] Group Notes:  (Clinical Social Work)  07/13/2015  11:00AM-12:00PM  Summary of Progress/Problems:  The main focus of today's process group was to listen to a variety of genres of music and to identify that different types of music provoke different responses.  The patient then was able to identify personally what was soothing for them, as well as energizing.  Handouts were used to record feelings evoked, as well as how patient can personally use this knowledge in sleep habits, with depression, and with other symptoms.  The patient expressed understanding of concepts, as well as knowledge of how each type of music affected him/her and how this can be used at home as a wellness/recovery tool.  She was in and out of the room, but did like dancing to some of the music.  Type of Therapy:  Music Therapy   Participation Level:  Active  Participation Quality:  Inattentive and Attentive (varied greatly)  Affect:  Blunted  Cognitive:  Disorganized  Insight: Improving  Engagement in Therapy:  Limited  Modes of Intervention:   Activity, Exploration  Selmer Dominion, LCSW 07/13/2015

## 2015-07-13 NOTE — Progress Notes (Signed)
Patient ID: [name], female   DOB: 1960/11/01, 54 y.o.   MRN: [id number] [facility] MD Progress Note  07/13/2015 1:37 PM ATZIRY BARANSKI  MRN:  [id number] Subjective: patient states " I am fine , I slept better.'       Objective:Patient is a 32 y old CF who has a hx of Bipolar disorder , as well as multiple medical comorbidities , who presented to [facility] by EMS , for nausea after smoking a cigarette. Patient per initial notes in EHR appeared to be disorganized , delusional in the ED.  I have discussed case with treatment team and have met with patient.Pt today appears to be less pressured, more organized than the previous days. Pt has been compliant on her medications , after starting Forced medication order. Pt is more calm , less intrusive and restless. Pt has been tolerating her medications well. Pt per nursing has been able to sleep better at night , now that she is more compliant on her medications. Will continue to encourage and support.        Principal Problem: Bipolar disorder, current episode manic severe with psychotic features [facility]) Diagnosis:   Patient Active Problem List   Diagnosis Date Noted  . Skin lesions [L98.9] 07/04/2015  . Prediabetes [R73.03] 07/03/2015  . Bipolar disorder, current episode manic severe with psychotic features ([place]) [F31.2] 06/30/2015  . History of breast cancer in female [Z85.3] 04/10/2015  . Irreducible ventral hernia [K46.0] 04/12/2013  . HTN (hypertension) [I10]   . COPD (chronic obstructive pulmonary disease) ([place]) [J44.9]   . Chronic kidney disease [N18.9]   . Hyperlipidemia [E78.5]   . Hypothyroidism [E03.9]   . Bipolar affective ([place]) [F31.9]   . Anemia in chronic kidney disease(285.21) [N03.9, D63.1] 07/10/2011  . Thrombocytopenia ([place]) [D69.6] 07/10/2011  . BIPOLAR AFFECTIVE DISORDER [F31.9] 08/15/2006  . SMOKER [F17.200] 08/15/2006  . COPD [J44.9] 08/15/2006  . BOILS, RECURRENT [L02.92, L02.93] 08/15/2006  . ACNE ROSACEA  [L71.9] 08/15/2006  . ACNE NEC [L70.8] 08/15/2006  . EDEMA [R60.9] 08/15/2006  . INCONTINENCE, URGE [N39.41] 08/15/2006   Total Time spent with patient: 25 minutes  Past Psychiatric History: Pt with hx of Bipolar disorder , several hospitalizations in the past. Pt unable to provide further details . Will work on obtaining collateral information.   Past Medical History:  Past Medical History  Diagnosis Date  . Anemia 07/10/2011  . Thrombocytopenia ([place]) 07/10/2011  . HTN (hypertension)   . COPD (chronic obstructive pulmonary disease) ([place])   . Hyperlipidemia   . Hypothyroidism   . Bipolar affective ([place])   . DM (diabetes mellitus) ([place])   . Renal insufficiency   . 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 Hypertension     Past Surgical History  Procedure Laterality Date  . Ankle surgery Left 1989  . Tonsillectomy  AGE 72  . Breast surgery Right 2010    BREAST  . Ventral hernia repair N/A 06/06/2013    Procedure:  OPEN VENTRAL HERNIA REPAIR;  Surgeon: Imogene Burn. Georgette Dover, MD;  Location: [facility] ORS;  Service: General;  Laterality: N/A;  . Insertion of mesh N/A 06/06/2013    Procedure: INSERTION OF MESH;  Surgeon: Imogene Burn. Georgette Dover, MD;  Location: [facility] ORS;  Service: General;  Laterality: N/A;   Family History:  Family History  Problem Relation Age of Onset  . Diabetes Mother   . Cancer Father   . HIV Brother    Family Psychiatric  History: Pt unable to provide details about family hx . Social History: Pt reports she is divorced and lives with a room mate .  History  Alcohol Use No     History  Drug Use No    Social History   Social History  . Marital Status: Divorced    Spouse Name: N/A  . Number of Children: N/A  . Years of Education: N/A   Social History Main Topics  . Smoking status: Current Every Day Smoker -- 0.50 packs/day for 40 years    Types: Cigarettes, Cigars  . Smokeless tobacco: Never Used  . Alcohol Use: No  . Drug Use: No  .  Sexual Activity: Not Asked   Other Topics Concern  . None   Social History Narrative   Additional Social History:    History of alcohol / drug use?: No history of alcohol / drug abuse (unable to assess)  Sleep: Fair-  Appetite:   Improved   Current Medications: Current Facility-Administered Medications  Medication Dose Route Frequency Provider Last Rate Last Dose  . acetaminophen (TYLENOL) tablet 650 mg  650 mg Oral Q4H PRN Lurena Nida, NP   650 mg at 07/13/15 0737  . albuterol (PROVENTIL HFA;VENTOLIN HFA) 108 (90 BASE) MCG/ACT inhaler 1-2 puff  1-2 puff Inhalation Q6H PRN Lurena Nida, NP   2 puff at 07/01/15 1924  . alum & mag hydroxide-simeth (MAALOX/MYLANTA) 200-200-20 MG/5ML suspension 30 mL  30 mL Oral PRN Lurena Nida, NP   30 mL at 07/09/15 0428  . ARIPiprazole (ABILIFY) tablet 15 mg  15 mg Oral BH-qamhs Indea Dearman, MD   15 mg at 07/13/15 0756   Or  . haloperidol lactate (HALDOL) injection 5 mg  5 mg Intramuscular BH-qamhs Marcell Chavarin, MD      . ARIPiprazole SUSR 400 mg  400 mg Intramuscular Q28 days Ursula Alert, MD   400 mg at 07/10/15 1512  . atorvastatin (LIPITOR) tablet 40 mg  40 mg Oral QHS Lurena Nida, NP   40 mg at 07/12/15 2114  . benztropine (COGENTIN) tablet 0.5 mg  0.5 mg Oral BID Ursula Alert, MD   0.5 mg at 07/13/15 0756   Or  . benztropine mesylate (COGENTIN) injection 0.5 mg  0.5 mg Intramuscular BID Jaylyn Booher, MD      . carvedilol (COREG) tablet 3.125 mg  3.125 mg Oral q morning - 10a Lurena Nida, NP   3.125 mg at 07/13/15 1014  . chlorpheniramine-HYDROcodone (TUSSIONEX) 10-8 MG/5ML suspension 5 mL  5 mL Oral Q12H PRN Lurena Nida, NP   5 mL at 07/08/15 0030  . diphenhydrAMINE (BENADRYL) capsule 50 mg  50 mg Oral Q6H PRN Ursula Alert, MD       Or  . diphenhydrAMINE (BENADRYL) injection 50 mg  50 mg Intramuscular Q6H PRN Ayce Pietrzyk, MD      . divalproex (DEPAKOTE) DR tablet 500 mg  500 mg Oral [id number] Ursula Alert, MD   500  mg at 07/13/15 0756  . divalproex (DEPAKOTE) DR tablet 750 mg  750 mg Oral QHS Ursula Alert, MD   750 mg at 07/12/15 2114  . feeding supplement (GLUCERNA SHAKE) (GLUCERNA SHAKE) liquid 237 mL  237 mL Oral TID BM Garnie Borchardt, MD   237 mL at 07/13/15 1015  . gabapentin (NEURONTIN) capsule 400 mg  400 mg Oral [id number] Chrystine Frogge, MD   400 mg at 07/13/15 0756  . haloperidol (HALDOL) tablet 5 mg  5 mg  Oral Q6H PRN Ursula Alert, MD   5 mg at 07/10/15 0106   Or  . haloperidol lactate (HALDOL) injection 5 mg  5 mg Intramuscular Q6H PRN Ursula Alert, MD      . hydrocortisone cream 1 %   Topical TID Ursula Alert, MD      . hydrOXYzine (ATARAX/VISTARIL) tablet 25 mg  25 mg Oral BID PRN Ursula Alert, MD      . hydrOXYzine (ATARAX/VISTARIL) tablet 50 mg  50 mg Oral QHS PRN Ursula Alert, MD      . levothyroxine (SYNTHROID, LEVOTHROID) tablet 75 mcg  75 mcg Oral QAC breakfast Lurena Nida, NP   75 mcg at 07/13/15 0602  . LORazepam (ATIVAN) tablet 1 mg  1 mg Oral Q8H PRN Ursula Alert, MD   1 mg at 07/12/15 0123   Or  . LORazepam (ATIVAN) injection 1 mg  1 mg Intramuscular Q8H PRN Ursula Alert, MD      . mometasone-formoterol (DULERA) 100-5 MCG/ACT inhaler 2 puff  2 puff Inhalation BID Lurena Nida, NP   2 puff at 07/13/15 0756  . multivitamin with minerals tablet 1 tablet  1 tablet Oral Daily Ursula Alert, MD   1 tablet at 07/13/15 0756  . neomycin-bacitracin-polymyxin (NEOSPORIN) ointment   Topical BID Ekin Pilar, MD      . nicotine (NICODERM CQ - dosed in mg/24 hours) patch 21 mg  21 mg Transdermal Daily Benjamine Mola, FNP   21 mg at 07/04/15 3428  . nystatin ointment (MYCOSTATIN)   Topical BID Beckett Maden, MD      . ramipril (ALTACE) capsule 2.5 mg  2.5 mg Oral q morning - 10a Lurena Nida, NP   2.5 mg at 07/13/15 1014  . zolpidem (AMBIEN) tablet 10 mg  10 mg Oral QHS Ursula Alert, MD   10 mg at 07/12/15 2114    Lab Results:  No results found for this or any  previous visit (from the past 48 hour(s)).  Physical Findings: AIMS: Facial and Oral Movements Muscles of Facial Expression: None, normal Lips and Perioral Area: None, normal Jaw: None, normal Tongue: None, normal,Extremity Movements Upper (arms, wrists, hands, fingers): None, normal Lower (legs, knees, ankles, toes): None, normal, Trunk Movements Neck, shoulders, hips: None, normal, Overall Severity Severity of abnormal movements (highest score from questions above): None, normal Incapacitation due to abnormal movements: None, normal Patient's awareness of abnormal movements (rate only patient's report): No Awareness, Dental Status Current problems with teeth and/or dentures?: No Does patient usually wear dentures?: Yes  CIWA:  CIWA-Ar Total: 3 COWS:  COWS Total Score: 2  Musculoskeletal: Strength & Muscle Tone: within normal limits Gait & Station: normal Patient leans: N/A  Psychiatric Specialty Exam: Review of Systems  Musculoskeletal: Positive for back pain.  Skin:       Lesions - ?s/p bug bites - healing  Psychiatric/Behavioral: The patient is nervous/anxious.   All other systems reviewed and are negative.   Blood pressure 123/66, pulse 78, temperature 98.4 F (36.9 C), temperature source Oral, resp. rate 17, height _0  (1.651 m), weight 94.348 kg (208 lb), SpO2 95 %.Body mass index is 34.61 kg/(m^2).  General Appearance: Fairly Groomed  Engineer, water::  Good  Speech:  Still pressured , but improving  Volume:  Normal  Mood:anxious  Affect: labile  , improving  Thought Process:  Disorganized and Tangential improving  Orientation: oriented to person, place and situation  Thought Content:   At present denies hallucinations, less  paranoid   Suicidal Thoughts:  No- denies any suicidal or self injurious ideations, contracts for safety on unit   Homicidal Thoughts:  No  Memory:  Immediate;   Fair Recent;   Poor Remote;   Poor  Judgement:  Fair  Insight:  Lacking   Psychomotor Activity:  Normal  Concentration:  Fair  Recall:  [place]: Fair  Akathisia:  No  Handed:  Right  AIMS (if indicated):     Assets:  Social Support Others:  access to health care  ADL's:  Improving   Cognition: WNL  Sleep:  Number of Hours: 6   Assessment - Patient today seen as less disorganized , more redirectable , has been taking her PO medications. Continues to be on  forced medications . Dr.Lugo has done a second opinion. Will continue treatment.   Treatment Plan Summary: Daily contact with patient to assess and evaluate symptoms and progress in treatment and Medication management  Continue to encourage group participation , milieu Will continue Depakote DR 500 mg BID and  750 mg po qhs  for mood disorder .Depakote level on 07/08/15- 58 ug/ml. Will get another level in 5 days ( 07/14/15), Monday . Continue Abilify 15 mg  Po BID for psychosis, mood disorder . Continue Haldol 5 mg IM BID if she refuses her PO medications. She has received her Abilify Maintena 400 mg IM q28 days - first dose on 07/10/15. Will continue her on Ambien 10 mg po qhs. Will also make available Vistaril 50 mg po qhs prn for sleep . Continue  Gabapentin 400 mg po tid to augment the effect of Depakote for mood lability. Continue Haldol 5 mg po /IM prn , Benadryl 50 mg PO/IM prn ,Ativan 1 mg PO/IM prn for severe anxiety/agitation. Treatment team working on disposition planning, at this time plan is for patient to return to her [place] after discharge .    Senai Ramnath MD 07/13/2015, 1:37 PM

## 2015-07-13 NOTE — Progress Notes (Signed)
D: Pt continues mildly confused; however, pleasant and cooperative. Pt at this time continues to deny depression, anxiety, SI/HI and AVH. She states, "There is nothing wrong with me." Pt has been considerable calmer and more attentive than what she used to be; able to now answer question appropriately, respect other's space and able to now patiently wait in line during med pass. Pt continues to be nonviolent.  A: Pt was encouraged to attend group. Medications offered as prescribed.  Support, encouragement, and safe environment provided.  15-minute safety checks continue.    R: Pt was med compliant.  Pt did attend wrap-up group. Safety checks continue.

## 2015-07-14 LAB — VALPROIC ACID LEVEL: VALPROIC ACID LVL: 64 ug/mL (ref 50.0–100.0)

## 2015-07-14 MED ORDER — HYDROXYZINE HCL 25 MG PO TABS: 25.0000 mg | ORAL_TABLET | Freq: Two times a day (BID) | ORAL | Status: DC | PRN

## 2015-07-14 MED ORDER — GABAPENTIN 400 MG PO CAPS: 400.0000 mg | ORAL_CAPSULE | ORAL | Status: DC

## 2015-07-14 MED ORDER — HYDROCORTISONE 1 % EX CREA: TOPICAL_CREAM | Freq: Three times a day (TID) | CUTANEOUS | Status: DC

## 2015-07-14 MED ORDER — DIVALPROEX SODIUM 250 MG PO DR TAB: 750.0000 mg | DELAYED_RELEASE_TABLET | Freq: Every day | ORAL | Status: AC

## 2015-07-14 MED ORDER — ZOLPIDEM TARTRATE 10 MG PO TABS: 10.0000 mg | ORAL_TABLET | Freq: Every day | ORAL | Status: DC

## 2015-07-14 MED ORDER — ADULT MULTIVITAMIN W/MINERALS CH: 1.0000 | ORAL_TABLET | Freq: Every day | ORAL | Status: DC

## 2015-07-14 MED ORDER — NYSTATIN 100000 UNIT/GM EX OINT: TOPICAL_OINTMENT | Freq: Two times a day (BID) | CUTANEOUS | Status: DC

## 2015-07-14 MED ORDER — LEVOTHYROXINE SODIUM 75 MCG PO TABS: 75.0000 ug | ORAL_TABLET | Freq: Every day | ORAL | Status: DC

## 2015-07-14 MED ORDER — ARIPIPRAZOLE ER 400 MG IM SUSR: 400.0000 mg | INTRAMUSCULAR | Status: DC

## 2015-07-14 MED ORDER — FLUTICASONE-SALMETEROL 250-50 MCG/DOSE IN AEPB: 1.0000 | INHALATION_SPRAY | Freq: Two times a day (BID) | RESPIRATORY_TRACT | Status: AC

## 2015-07-14 MED ORDER — RAMIPRIL 2.5 MG PO CAPS: 2.5000 mg | ORAL_CAPSULE | Freq: Every morning | ORAL | Status: DC

## 2015-07-14 MED ORDER — ARIPIPRAZOLE 15 MG PO TABS: 15.0000 mg | ORAL_TABLET | ORAL | Status: AC

## 2015-07-14 MED ORDER — DIVALPROEX SODIUM 500 MG PO DR TAB: 500.0000 mg | DELAYED_RELEASE_TABLET | ORAL | Status: DC

## 2015-07-14 MED ORDER — CARVEDILOL 3.125 MG PO TABS: 3.1250 mg | ORAL_TABLET | Freq: Every morning | ORAL | Status: DC

## 2015-07-14 MED ORDER — ATORVASTATIN CALCIUM 40 MG PO TABS: 40.0000 mg | ORAL_TABLET | Freq: Every day | ORAL | Status: DC

## 2015-07-14 MED ORDER — BENZTROPINE MESYLATE 0.5 MG PO TABS
0.5000 mg | ORAL_TABLET | Freq: Two times a day (BID) | ORAL | Status: DC
Start: 2015-07-14 — End: 2015-07-14

## 2015-07-14 MED ORDER — ARIPIPRAZOLE 15 MG PO TABS
15.0000 mg | ORAL_TABLET | ORAL | Status: DC
Start: 2015-07-14 — End: 2015-07-14

## 2015-07-14 MED ORDER — BENZTROPINE MESYLATE 0.5 MG PO TABS: 0.5000 mg | ORAL_TABLET | Freq: Two times a day (BID) | ORAL | Status: AC

## 2015-07-14 MED ORDER — NICOTINE 21 MG/24HR TD PT24: 21.0000 mg | MEDICATED_PATCH | Freq: Every day | TRANSDERMAL | Status: DC

## 2015-07-14 MED ORDER — DIVALPROEX SODIUM 250 MG PO DR TAB: 750.0000 mg | DELAYED_RELEASE_TABLET | Freq: Every day | ORAL | Status: DC

## 2015-07-14 MED ORDER — DIVALPROEX SODIUM 500 MG PO DR TAB: 500.0000 mg | DELAYED_RELEASE_TABLET | ORAL | Status: AC

## 2015-07-14 NOTE — Progress Notes (Signed)
D: Pt still confuse and paranoid; always suspicious of her drinking water. Pt however, continue to be pleasant and cooperative. Pt at this time continues to deny depression, anxiety, SI/HI and AVH. She states, "I don't need to be here." Pt could still be intrusive; however, can be easily redirect. Pt continues to be nonviolent.  A: Pt was encouraged to attend group. Medications offered as prescribed.  Support, encouragement, and safe environment provided.  15-minute safety checks continue.    R: Pt was med compliant.  Pt did attend wrap-up group. Safety checks continue.

## 2015-07-14 NOTE — [facility] Suicide Risk Assessment (Signed)
[facility] Discharge Suicide Risk Assessment   Demographic Factors:  Caucasian  Total Time spent with patient: 30 minutes  Musculoskeletal: Strength & Muscle Tone: within normal limits Gait & Station: normal Patient leans: normal  Psychiatric Specialty Exam: Physical Exam  Review of Systems  Constitutional: Negative.   HENT: Negative.   Eyes: Negative.   Respiratory: Negative.   Cardiovascular: Negative.   Gastrointestinal: Negative.   Genitourinary: Negative.   Musculoskeletal: Positive for back pain.  Skin: Positive for rash.  Neurological: Negative.   Endo/Heme/Allergies: Negative.   Psychiatric/Behavioral: The patient is nervous/anxious.     Blood pressure 121/73, pulse 84, temperature 97.8 F (36.6 C), temperature source Oral, resp. rate 16, height 5\' 5"  (1.651 m), weight 94.348 kg (208 lb), SpO2 95 %.Body mass index is 34.61 kg/(m^2).  General Appearance: Fairly Groomed  Engineer, water::  Fair  Speech:  Clear and Coherent and less pressured409  Volume:  Normal  Mood:  Anxious and wants to be D/C   Affect:  Restricted  Thought Process:  Coherent and Goal Directed  Orientation:  Full (Time, Place, and Person)  Thought Content:  plans as she is D/C  Suicidal Thoughts:  No  Homicidal Thoughts:  No  Memory:  Immediate;   Fair Recent;   Fair Remote;   Fair  Judgement:  Fair  Insight:  Shallow  Psychomotor Activity:  Normal  Concentration:  Fair  Recall:  [place]  Language: Fair  Akathisia:  No  Handed:  Right  AIMS (if indicated):     Assets:  Desire for Improvement Housing Social Support  Sleep:  Number of Hours: 5.75  Cognition: WNL  ADL's:  Intact   Have you used any form of tobacco in the last 30 days? (Cigarettes, Smokeless Tobacco, Cigars, and/or Pipes): Yes  Has this patient used any form of tobacco in the last 30 days? (Cigarettes, Smokeless Tobacco, Cigars, and/or Pipes) Yes, A prescription for an FDA-approved tobacco cessation  medication was offered at discharge and the patient refused  Mental Status Per Nursing Assessment::   On Admission:  NA  Current Mental Status by Physician: In full contact with reality. She is focused on wanting to be D/C today. She states she slept "like a baby" last night. She states she is committed to comply with her medications   Loss Factors: NA  Historical Factors: NA  Risk Reduction Factors:   Living with another person, especially a relative and Positive social support  Continued Clinical Symptoms:  Bipolar Disorder:   Mixed State  Cognitive Features That Contribute To Risk:  Closed-mindedness, Polarized thinking and Thought constriction (tunnel vision)    Suicide Risk:  Minimal: No identifiable suicidal ideation.  Patients presenting with no risk factors but with morbid ruminations; may be classified as minimal risk based on the severity of the depressive symptoms  Principal Problem: Bipolar disorder, current episode manic severe with psychotic features [facility]) Discharge Diagnoses:  Patient Active Problem List   Diagnosis Date Noted  . Skin lesions [L98.9] 07/04/2015  . Prediabetes [R73.03] 07/03/2015  . Bipolar disorder, current episode manic severe with psychotic features ([place]) [F31.2] 06/30/2015  . History of breast cancer in female [Z85.3] 04/10/2015  . Irreducible ventral hernia [K46.0] 04/12/2013  . HTN (hypertension) [I10]   . COPD (chronic obstructive pulmonary disease) ([place]) [J44.9]   . Chronic kidney disease [N18.9]   . Hyperlipidemia [E78.5]   . Hypothyroidism [E03.9]   . Bipolar affective ([place]) [F31.9]   . Anemia in chronic  kidney disease(285.21) [N03.9, D63.1] 07/10/2011  . Thrombocytopenia ([place]) [D69.6] 07/10/2011  . BIPOLAR AFFECTIVE DISORDER [F31.9] 08/15/2006  . SMOKER [F17.200] 08/15/2006  . COPD [J44.9] 08/15/2006  . BOILS, RECURRENT [L02.92, L02.93] 08/15/2006  . ACNE ROSACEA [L71.9] 08/15/2006  . ACNE NEC [L70.8] 08/15/2006  . EDEMA  [R60.9] 08/15/2006  . INCONTINENCE, URGE [N39.41] 08/15/2006    Follow-up Information    Follow up with [facility] On 08/06/2015.   Why:  Wed at 12:20 with [name] information:   [place] [phone]      Follow up with [facility].   Why:  Resume your schedule with them when you return home      Plan Of Care/Follow-up recommendations:  Activity:  as tolerated Diet:  as per nutritionist Follow up as above Is patient on multiple antipsychotic therapies at discharge:  No   Has Patient had three or more failed trials of antipsychotic monotherapy by history:  No  Recommended Plan for Multiple Antipsychotic Therapies: NA    Caci Orren A 07/14/2015, 10:10 AM

## 2015-07-14 NOTE — Progress Notes (Signed)
Adult Psychoeducational Group Note  Date:  07/14/2015 Time:  12:26 AM  Group Topic/Focus:  Wrap-Up Group:   The focus of this group is to help patients review their daily goal of treatment and discuss progress on daily workbooks.  Participation Level:  Active  Participation Quality:  Attentive  Affect:  Appropriate  Cognitive:  Appropriate  Insight: Good  Engagement in Group:  Engaged  Modes of Intervention:  Education  Additional Comments:  Pt stated she had a good day.   Jerline Pain 07/14/2015, 12:26 AM
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
## 2015-07-14 NOTE — Progress Notes (Signed)
Parks Ranger, RN Registered Nurse Incomplete Nursing Progress Notes 07/14/2015 5:11 PM    Expand All Collapse All   Pt asked this RN to please call [name] to let her know that she is able to be DC'd. This RN called and spoke with Mrs. Davis regarding discharge, stating that she has been ready all afternoon.. Mrs [name] stated that she did not know this and she wasn't sure that she could pick up her medications at the Pharmacy at this hour (1655). This RN asked her to pick up the meds (which were faxed) on the way to the hospital to pick up the pt. Call placed to [facility], verified that they are open until 6pm. The representative stated that she had already sent out the pts meds by [employer] to [name]' home. The Pharmacy representative states "often times the pt does not receive all her medicines because her CG does not give the money." Representative stated that all meds vary between $1.00 and $3.00.   Pt discharged home with Mrs. Davis . Pt was stable and appreciative at time of discharge. All discharge paperwork, prescriptions were given and valuables returned. Verbal understanding expressed, pt given opportunity to express concerns and ask questions. Denies SI and A/V hallucinations.

## 2015-07-14 NOTE — [facility] Suicide Risk Assessment (Signed)
[name] INPATIENT:  Family/Significant Other Suicide Prevention Education  Suicide Prevention Education:  Education Completed; No one has been identified by the patient as the family member/significant other with whom the patient will be residing, and identified as the person(s) who will aid the patient in the event of a mental health crisis (suicidal ideations/suicide attempt).  With written consent from the patient, the family member/significant other has been provided the following suicide prevention education, prior to the and/or following the discharge of the patient.  The suicide prevention education provided includes the following:  Suicide risk factors  Suicide prevention and interventions  National Suicide Hotline telephone number  [facility] assessment telephone number  [facility] Emergency Assistance [place] and/or Residential Mobile Crisis Unit telephone number  Request made of family/significant other to:  Remove weapons (e.g., guns, rifles, knives), all items previously/currently identified as safety concern.    Remove drugs/medications (over-the-counter, prescriptions, illicit drugs), all items previously/currently identified as a safety concern.  The family member/significant other verbalizes understanding of the suicide prevention education information provided.  The family member/significant other agrees to remove the items of safety concern listed above. The patient did not endorse SI at the time of admission, nor did the patient c/o SI during the stay here.  SPE not required.   [name] B 07/14/2015, 8:25 AM

## 2015-07-14 NOTE — Tx Team (Signed)
Progress in Treatment:  Attending groups: Yes Participating in groups: Yes Taking medication as prescribed:Yes  Finally agreeing to take all prescribed meds with threat of injection Tolerating medication: Yes  Family/Significant other contact made: Yes [name] of group home Patient understands diagnosis: No Limited insight Discussing patient identified problems/goals with staff: Yes  Medical problems stabilized or resolved: Yes  Denies suicidal/homicidal ideation: Yes Patient has not harmed self or Others: Yes   New problem(s) identified: None identified at this time.   Discharge Plan or Barriers: Per Pt, she is from [facility]; will return there at DC. Follow up [facility] and [place]  Additional comments: Meds started: Abilify 42m, Depakote 5065m  07/08/15: Abilify and Depakote have both been increased. Poor sleep the last 2 days-will address with meds.  07/10/15 Client is visible on the unit, has a visitor today "that's the maintenance man where I lived, him and his wife good to me" Client is attentive and calm as he visits. [place] at times, still tangible. A: Writer redirects client as needed, encourages client to take medication. Staff will monitor q1527mfor safety. R: client is safe on the unit, attended karaoke, sang a song.            07/11/15: Pt is med compliant and is getting adequate sleep.   Reason for Continuation of Hospitalization:    Estimated length of stay: D/C today  Review of initial/current patient goals per problem list:  1. Goal(s): Patient will participate in aftercare plan  Met:Yes  Target date: 3-5 days from date of admission  As evidenced by: Patient will participate within aftercare plan AEB aftercare provider and housing plan at discharge being identified.  06[facility]30/15: [facility] Goal(s): Patient will demonstrate decreased signs of psychosis   Met:Yes  Target date: 3-5 days from date of admission  As evidenced by: Patient will demonstrate decreased frequency of AVH or return to baseline function 06/30/15: Pt continues to be actively psychotic; observed to be responding to internal stimuli, delusional thought content 07/08/15: Today pt presents with paranoia, delusions. Refusing meds because she is allergic to all of them,and we are trying to "poison" her 07/11/15: Pt denies AVH. Pt is compliant with meds and team is working to get her stabilized. 07/14/15:  No signs nor symptoms of psychosis today  07/03/15: Pt denies AVH and is exhibiting more appropriate behavior. 6. Goal (s): Patient will demonstrate decreased signs of mania  Met: Met  Target date: 3-5 days from date of admission  As evidenced by: Patient demonstrate decreased signs of mania AEB decreased mood instability and demonstration of stable mood 06/30/15: Pt continues to exhibit pressured speech, delusional and disorganized thought process, with labile mood.  07/03/15: Pt is still exhibiting pressured speech and flight of ideas. However, pt's impulse control seems to be improving as evidenced by pt raising her hand to speak while in group instead of interrupting. 07/08/15: Mood is stable. Sleep is poor, and she continues to present with pressured speech and flight of ideas. 07/11/15: Pt is med compliant after being told she would be injected with medications if she does not comply. Her sleep is improving. Pt still exhibits pressured sleep and flight of ideas but is showing an increase in impulse control. 07/14/15:  Pt is at baseline   Attendees:  Patient:    Family:  Physician: Dr Shea Evans  06/30/2015 1:18 PM  Nursing: Lars Pinks, RN Case manager   06/30/2015 1:18 PM  Clinical Social Worker Rod Hurstbourne 06/30/2015 1:18 PM  Other:  06/30/2015 1:18 PM  Clinical: Hedy Jacob 06/30/2015 1:18 PM  Other: , RN Charge Nurse 06/30/2015 1:18 PM  Other:     Peri Maris, Latanya Presser MSW

## 2015-07-14 NOTE — Progress Notes (Signed)
  [facility] Adult Case Management Discharge Plan :  Will you be returning to the same living situation after discharge:  Yes,  [name] [facility] At discharge, do you have transportation home?: Yes,  see above Do you have the ability to pay for your medications: Yes,  MCR?MCD  Release of information consent forms completed and in the chart;  Patient's signature needed at discharge.  Patient to Follow up at: Follow-up Information    Follow up with [facility] On 08/06/2015.   Why:  Wed at 12:20 with [name] information:   [place] [phone]      Follow up with [facility].   Why:  Resume your schedule with them when you return home      Next level of care provider has access to [place]  Patient denies SI/HI: Yes,  yes    Safety Planning and Suicide Prevention discussed: Yes,  yes  Have you used any form of tobacco in the last 30 days? (Cigarettes, Smokeless Tobacco, Cigars, and/or Pipes): Yes  Has patient been referred to the Quitline?: Patient refused referral  Trish Mage 07/14/2015, 1:05 PM

## 2015-08-16 ENCOUNTER — Emergency Department (HOSPITAL_COMMUNITY)
Admission: EM | Admit: 2015-08-16 | Discharge: 2015-08-16 | Disposition: A | Discharge status: Home / Self Care | Payer: Medicare Other | Attending: Emergency Medicine | Admitting: Emergency Medicine

## 2015-08-16 ENCOUNTER — Encounter (HOSPITAL_COMMUNITY): Payer: Self-pay | Admitting: *Deleted

## 2015-08-16 DIAGNOSIS — Z880 Allergy status to penicillin: Secondary | ICD-10-CM | POA: Diagnosis not present

## 2015-08-16 DIAGNOSIS — Y999 Unspecified external cause status: Secondary | ICD-10-CM | POA: Diagnosis not present

## 2015-08-16 DIAGNOSIS — S299XXA Unspecified injury of thorax, initial encounter: Secondary | ICD-10-CM | POA: Diagnosis not present

## 2015-08-16 DIAGNOSIS — Z79899 Other long term (current) drug therapy: Secondary | ICD-10-CM | POA: Insufficient documentation

## 2015-08-16 DIAGNOSIS — T148XXA Other injury of unspecified body region, initial encounter: Secondary | ICD-10-CM

## 2015-08-16 DIAGNOSIS — E119 Type 2 diabetes mellitus without complications: Secondary | ICD-10-CM | POA: Diagnosis not present

## 2015-08-16 DIAGNOSIS — S4992XA Unspecified injury of left shoulder and upper arm, initial encounter: Secondary | ICD-10-CM | POA: Diagnosis present

## 2015-08-16 DIAGNOSIS — F319 Bipolar disorder, unspecified: Secondary | ICD-10-CM | POA: Insufficient documentation

## 2015-08-16 DIAGNOSIS — J449 Chronic obstructive pulmonary disease, unspecified: Secondary | ICD-10-CM | POA: Diagnosis not present

## 2015-08-16 DIAGNOSIS — E039 Hypothyroidism, unspecified: Secondary | ICD-10-CM | POA: Diagnosis not present

## 2015-08-16 DIAGNOSIS — I10 Essential (primary) hypertension: Secondary | ICD-10-CM | POA: Insufficient documentation

## 2015-08-16 DIAGNOSIS — T148 Other injury of unspecified body region: Secondary | ICD-10-CM | POA: Diagnosis not present

## 2015-08-16 DIAGNOSIS — Y92129 Unspecified place in nursing home as the place of occurrence of the external cause: Secondary | ICD-10-CM | POA: Insufficient documentation

## 2015-08-16 DIAGNOSIS — Z87448 Personal history of other diseases of urinary system: Secondary | ICD-10-CM | POA: Insufficient documentation

## 2015-08-16 DIAGNOSIS — Z862 Personal history of diseases of the blood and blood-forming organs and certain disorders involving the immune mechanism: Secondary | ICD-10-CM | POA: Insufficient documentation

## 2015-08-16 DIAGNOSIS — Y9301 Activity, walking, marching and hiking: Secondary | ICD-10-CM | POA: Diagnosis not present

## 2015-08-16 DIAGNOSIS — Z7952 Long term (current) use of systemic steroids: Secondary | ICD-10-CM | POA: Insufficient documentation

## 2015-08-16 DIAGNOSIS — Z853 Personal history of malignant neoplasm of breast: Secondary | ICD-10-CM | POA: Insufficient documentation

## 2015-08-16 DIAGNOSIS — S0990XA Unspecified injury of head, initial encounter: Secondary | ICD-10-CM | POA: Insufficient documentation

## 2015-08-16 DIAGNOSIS — W01198A Fall on same level from slipping, tripping and stumbling with subsequent striking against other object, initial encounter: Secondary | ICD-10-CM | POA: Insufficient documentation

## 2015-08-16 DIAGNOSIS — E785 Hyperlipidemia, unspecified: Secondary | ICD-10-CM | POA: Diagnosis not present

## 2015-08-16 MED ORDER — ACETAMINOPHEN 500 MG PO TABS: 500.0000 mg | ORAL_TABLET | Freq: Four times a day (QID) | ORAL | Status: AC | PRN

## 2015-08-16 MED ORDER — ACETAMINOPHEN 325 MG PO TABS
650.0000 mg | ORAL_TABLET | Freq: Once | ORAL | Status: AC
  Administered 2015-08-16: 650 mg via ORAL
  Filled 2015-08-16: qty 2

## 2015-08-16 NOTE — ED Notes (Signed)
Spoke with [name] at [facility] and pt is able to ride the bus.

## 2015-08-16 NOTE — Discharge Instructions (Signed)
Please read and follow all provided instructions.  Your diagnoses today include:  1. Muscle strain     Tests performed today include:  Vital signs. See below for your results today.   Medications prescribed:   Tylenol   Take any prescribed medications only as directed.  Home care instructions:   Follow any educational materials contained in this packet  Follow R.I.C.E. Protocol:  R - rest your injury   I  - use ice on injury without applying directly to skin  C - compress injury with bandage or splint  E - elevate the injury as much as possible  Follow-up instructions: Please follow-up with your primary care provider o if you continue to have significant pain in 1 week. In this case you may have a more severe injury that requires further care.   Return instructions:   Please return if your toes or feet are numb or tingling, appear gray or blue, or you have severe pain (also elevate the leg and loosen splint or wrap if you were given one)  Please return to the Emergency Department if you experience worsening symptoms.   Please return if you have any other emergent concerns.  Additional Information:  Your vital signs today were: BP 116/96 mmHg   Pulse 77   Temp(Src) 98.2 F (36.8 C)   Resp 20   Ht 5' 5.5" (1.664 m)   Wt 98.884 kg   BMI 35.71 kg/m2   SpO2 98% If your blood pressure (BP) was elevated above 135/85 this visit, please have this repeated by your doctor within one month. --------------

## 2015-08-16 NOTE — ED Notes (Addendum)
pt reports neck, bilat shoulder, and back pain after unwitnessed fall last night at 9pm- pt sts her facility refused to call 911 when she told them she fell; pt sts she tripped over something- denies fainting and LOC; pt also c/o skin rash with itching "for a long time"; pt asked to explain "long time", pt stated "months"; bleeding and scabs noted to right posterior shoulder

## 2015-08-16 NOTE — ED Provider Notes (Signed)
CSN: [id number]     Arrival date & time 08/16/15  0257 History   First MD Initiated Contact with Patient 08/16/15 0601     Chief Complaint  Patient presents with  . Fall     (Consider location/radiation/quality/duration/timing/severity/associated sxs/prior Treatment) HPI Comments: Patient with history of bipolar disorder, lives at a group home -- presents with complaint of generalized pain after a trip and fall occurring last evening. Patient states that she slipped while walking quickly on an uneven surface. She states that she fell forward and thinks that she hit her head. She did not lose consciousness. She was assisted immediately. No history of blood thinner use. Patient states that she went to sleep last night but when she woke up her pain is worse. Pain is all over her body, however worse in the left shoulder. She also complains of a headache. No treatments prior to arrival. Pain is worse with movement. Nothing makes it better. Course is constant.  The history is provided by the patient.    Past Medical History  Diagnosis Date  . Anemia 07/10/2011  . Thrombocytopenia ([place]) 07/10/2011  . HTN (hypertension)   . COPD (chronic obstructive pulmonary disease) ([place])   . Hyperlipidemia   . Hypothyroidism   . Bipolar affective ([place])   . DM (diabetes mellitus) ([place])   . Renal insufficiency   . 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 Hypertension    Past Surgical History  Procedure Laterality Date  . Ankle surgery Left 1989  . Tonsillectomy  AGE 54  . Breast surgery Right 2010    BREAST  . Ventral hernia repair N/A 06/06/2013    Procedure:  OPEN VENTRAL HERNIA REPAIR;  Surgeon: Imogene Burn. Georgette Dover, MD;  Location: [facility];  Service: General;  Laterality: N/A;  . Insertion of mesh N/A 06/06/2013    Procedure: INSERTION OF MESH;  Surgeon: Imogene Burn. Georgette Dover, MD;  Location: [facility];  Service: General;  Laterality: N/A;   Family History  Problem Relation Age of Onset   . Diabetes Mother   . Cancer Father   . HIV Brother    Social History  Substance Use Topics  . Smoking status: Current Every Day Smoker -- 0.50 packs/day for 40 years    Types: Cigarettes, Cigars  . Smokeless tobacco: Never Used  . Alcohol Use: No   OB History    No data available     Review of Systems  Constitutional: Negative for activity change.  Musculoskeletal: Positive for myalgias and arthralgias. Negative for back pain, joint swelling and neck pain.  Skin: Negative for wound.  Neurological: Positive for headaches. Negative for weakness and numbness.      Allergies  Doxycycline; Glipizide; Ibuprofen; Penicillins; Sulfonamide derivatives; Tetracycline; Tolterodine tartrate; Chlorostat; Cogentin; and Oxybutynin chloride  Home Medications   Prior to Admission medications   Medication Sig Start Date End Date Taking? Authorizing Provider  albuterol (PROVENTIL HFA;VENTOLIN HFA) 108 (90 BASE) MCG/ACT inhaler Inhale 1-2 puffs into the lungs every 6 (six) hours as needed for wheezing or shortness of breath.   Yes Historical Provider, MD  ARIPiprazole (ABILIFY) 15 MG tablet Take 1 tablet (15 mg total) by mouth 2 (two) times daily in the am and at bedtime.. 07/14/15  Yes Benjamine Mola, FNP  ARIPiprazole 400 MG SUSR Inject 400 mg into the muscle every 28 (twenty-eight) days. To be given at office. 08/07/15  Yes Benjamine Mola, FNP  atorvastatin (LIPITOR) 40 MG  tablet Take 1 tablet (40 mg total) by mouth at bedtime. 07/14/15  Yes Benjamine Mola, FNP  benztropine (COGENTIN) 0.5 MG tablet Take 1 tablet (0.5 mg total) by mouth 2 (two) times daily. 07/14/15  Yes Benjamine Mola, FNP  carvedilol (COREG) 3.125 MG tablet Take 1 tablet (3.125 mg total) by mouth every morning. 07/14/15  Yes Benjamine Mola, FNP  divalproex (DEPAKOTE) 250 MG DR tablet Take 3 tablets (750 mg total) by mouth at bedtime. 07/14/15  Yes Benjamine Mola, FNP  divalproex (DEPAKOTE) 500 MG DR tablet Take 1 tablet (500  mg total) by mouth 2 (two) times daily at 8am and 3pm. 07/14/15  Yes Benjamine Mola, FNP  Fluticasone-Salmeterol (ADVAIR) 250-50 MCG/DOSE AEPB Inhale 1 puff into the lungs 2 (two) times daily. 07/14/15  Yes Benjamine Mola, FNP  gabapentin (NEURONTIN) 400 MG capsule Take 1 capsule (400 mg total) by mouth 3 (three) times daily at 8am, 3pm and bedtime. 07/14/15  Yes Benjamine Mola, FNP  hydrocortisone cream 1 % Apply topically 3 (three) times daily. 07/14/15  Yes Benjamine Mola, FNP  hydrOXYzine (ATARAX/VISTARIL) 25 MG tablet Take 1 tablet (25 mg total) by mouth 2 (two) times daily as needed for anxiety. 07/14/15  Yes Benjamine Mola, FNP  levothyroxine (SYNTHROID, LEVOTHROID) 75 MCG tablet Take 1 tablet (75 mcg total) by mouth daily before breakfast. 07/14/15  Yes Benjamine Mola, FNP  Multiple Vitamin (MULTIVITAMIN WITH MINERALS) TABS tablet Take 1 tablet by mouth daily. 07/14/15  Yes Benjamine Mola, FNP  nystatin ointment (MYCOSTATIN) Apply topically 2 (two) times daily. 07/14/15  Yes Benjamine Mola, FNP  ramipril (ALTACE) 2.5 MG capsule Take 1 capsule (2.5 mg total) by mouth every morning. 07/14/15  Yes Benjamine Mola, FNP  zolpidem (AMBIEN) 10 MG tablet Take 1 tablet (10 mg total) by mouth at bedtime. 07/14/15 08/16/15 Yes John C Withrow, FNP   BP 116/96 mmHg  Pulse 77  Temp(Src) 98.2 F (36.8 C)  Resp 20  Ht 5' 5.5" (1.664 m)  Wt 98.884 kg  BMI 35.71 kg/m2  SpO2 98% Physical Exam  Constitutional: She is oriented to person, place, and time. She appears well-developed and well-nourished.  HENT:  Head: Normocephalic and atraumatic. Head is without raccoon's eyes and without Battle's sign.  Right Ear: Tympanic membrane, external ear and ear canal normal. No hemotympanum.  Left Ear: Tympanic membrane, external ear and ear canal normal. No hemotympanum.  Nose: Nose normal. No nasal septal hematoma.  Mouth/Throat: Uvula is midline, oropharynx is clear and moist and mucous membranes are normal.   Eyes: Conjunctivae, EOM and lids are normal. Pupils are equal, round, and reactive to light. Right eye exhibits no nystagmus. Left eye exhibits no nystagmus.  No visible hyphema noted  Neck: Normal range of motion. Neck supple.  Cardiovascular: Normal rate and regular rhythm.  Exam reveals no decreased pulses.   Pulmonary/Chest: Effort normal and breath sounds normal.  Abdominal: Soft. There is no tenderness.  Musculoskeletal: She exhibits tenderness. She exhibits no edema.       Right shoulder: Normal.       Left shoulder: She exhibits tenderness. She exhibits normal range of motion and no bony tenderness.       Right elbow: Normal.      Left elbow: Normal.       Right wrist: Normal.       Left wrist: Normal.       Right hip: Normal.  Left hip: Normal.       Right knee: Normal.       Left knee: Normal.       Right ankle: Normal.       Left ankle: Normal.       Cervical back: She exhibits normal range of motion, no tenderness and no bony tenderness.       Thoracic back: She exhibits tenderness. She exhibits no bony tenderness.       Lumbar back: She exhibits no tenderness and no bony tenderness.       Left upper arm: She exhibits tenderness. She exhibits no bony tenderness and no swelling.  Neurological: She is alert and oriented to person, place, and time. She has normal strength and normal reflexes. No cranial nerve deficit or sensory deficit. Coordination normal. GCS eye subscore is 4. GCS verbal subscore is 5. GCS motor subscore is 6.  Motor, sensation, and vascular distal to the injury is fully intact.   Skin: Skin is warm and dry.  Psychiatric: She has a normal mood and affect.  Nursing note and vitals reviewed.   ED Course  Procedures (including critical care time) Labs Review Labs Reviewed - No data to display  Imaging Review No results found. I have personally reviewed and evaluated these images and lab results as part of my medical decision-making.   EKG  Interpretation None       6:11 AM Patient seen and examined. Medications ordered.   Vital signs reviewed and are as follows: BP 116/96 mmHg  Pulse 77  Temp(Src) 98.2 F (36.8 C)  Resp 20  Ht 5' 5.5" (1.664 m)  Wt 98.884 kg  BMI 35.71 kg/m2  SpO2 98%  No significant injury on exam. Patient up in hallway without any difficulty or distress. Will give Tylenol. Counseled on conservative measures. Nursing staff has contacted patient's group home and she is able to take the bus. Patient is requesting discharge.  MDM   Final diagnoses:  Muscle strain   Patient with mild musculoskeletal injury after a fall last night. She seems to be at her mental baseline. She is ambulating without difficulty. No indication for head CT per [place] head CT rules. Discharge to home with conservative measures.   Carlisle Cater, PA-C 08/16/15 [id number]  Everlene Balls, MD 08/16/15 [phone]

## 2015-08-16 NOTE — ED Notes (Signed)
The pt arrived by [facility] by w/c to triage  She was brought from a group home she says she fell  Everyone at the group home tonight said she has not fallen.  She  Reports that she is hurting all over her body

## 2015-08-16 NOTE — ED Notes (Addendum)
Per pt, she is from [facility]. Attempted to call facility; no answer. Pt adamant that she is ready to go and wants to take the bus. PA notified.

## 2015-12-08 ENCOUNTER — Encounter (HOSPITAL_COMMUNITY): Payer: Self-pay | Admitting: Emergency Medicine

## 2015-12-08 ENCOUNTER — Emergency Department (HOSPITAL_COMMUNITY): Payer: Medicare Other

## 2015-12-08 ENCOUNTER — Inpatient Hospital Stay (HOSPITAL_COMMUNITY)
Admission: EM | Admit: 2015-12-08 | Discharge: 2015-12-25 | DRG: 208 | Disposition: A | Discharge status: Skilled Nursing Facility | Payer: Medicare Other | Attending: Family Medicine | Admitting: Family Medicine

## 2015-12-08 DIAGNOSIS — E1122 Type 2 diabetes mellitus with diabetic chronic kidney disease: Secondary | ICD-10-CM | POA: Diagnosis present

## 2015-12-08 DIAGNOSIS — J189 Pneumonia, unspecified organism: Secondary | ICD-10-CM

## 2015-12-08 DIAGNOSIS — E039 Hypothyroidism, unspecified: Secondary | ICD-10-CM | POA: Diagnosis present

## 2015-12-08 DIAGNOSIS — Y95 Nosocomial condition: Secondary | ICD-10-CM | POA: Diagnosis present

## 2015-12-08 DIAGNOSIS — I10 Essential (primary) hypertension: Secondary | ICD-10-CM | POA: Diagnosis present

## 2015-12-08 DIAGNOSIS — E213 Hyperparathyroidism, unspecified: Secondary | ICD-10-CM | POA: Diagnosis present

## 2015-12-08 DIAGNOSIS — I959 Hypotension, unspecified: Secondary | ICD-10-CM | POA: Diagnosis present

## 2015-12-08 DIAGNOSIS — J441 Chronic obstructive pulmonary disease with (acute) exacerbation: Secondary | ICD-10-CM

## 2015-12-08 DIAGNOSIS — E860 Dehydration: Secondary | ICD-10-CM | POA: Diagnosis present

## 2015-12-08 DIAGNOSIS — R7989 Other specified abnormal findings of blood chemistry: Secondary | ICD-10-CM | POA: Diagnosis present

## 2015-12-08 DIAGNOSIS — N189 Chronic kidney disease, unspecified: Secondary | ICD-10-CM | POA: Diagnosis not present

## 2015-12-08 DIAGNOSIS — D696 Thrombocytopenia, unspecified: Secondary | ICD-10-CM | POA: Diagnosis not present

## 2015-12-08 DIAGNOSIS — N183 Chronic kidney disease, stage 3 (moderate): Secondary | ICD-10-CM | POA: Diagnosis present

## 2015-12-08 DIAGNOSIS — G9341 Metabolic encephalopathy: Secondary | ICD-10-CM | POA: Diagnosis not present

## 2015-12-08 DIAGNOSIS — F3189 Other bipolar disorder: Secondary | ICD-10-CM | POA: Diagnosis present

## 2015-12-08 DIAGNOSIS — R7303 Prediabetes: Secondary | ICD-10-CM | POA: Diagnosis present

## 2015-12-08 DIAGNOSIS — E875 Hyperkalemia: Secondary | ICD-10-CM | POA: Diagnosis not present

## 2015-12-08 DIAGNOSIS — Z452 Encounter for adjustment and management of vascular access device: Secondary | ICD-10-CM

## 2015-12-08 DIAGNOSIS — Z638 Other specified problems related to primary support group: Secondary | ICD-10-CM

## 2015-12-08 DIAGNOSIS — Z888 Allergy status to other drugs, medicaments and biological substances status: Secondary | ICD-10-CM | POA: Diagnosis not present

## 2015-12-08 DIAGNOSIS — F312 Bipolar disorder, current episode manic severe with psychotic features: Secondary | ICD-10-CM | POA: Diagnosis present

## 2015-12-08 DIAGNOSIS — F600 Paranoid personality disorder: Secondary | ICD-10-CM | POA: Diagnosis present

## 2015-12-08 DIAGNOSIS — R410 Disorientation, unspecified: Secondary | ICD-10-CM | POA: Diagnosis not present

## 2015-12-08 DIAGNOSIS — I129 Hypertensive chronic kidney disease with stage 1 through stage 4 chronic kidney disease, or unspecified chronic kidney disease: Secondary | ICD-10-CM | POA: Diagnosis present

## 2015-12-08 DIAGNOSIS — N179 Acute kidney failure, unspecified: Secondary | ICD-10-CM

## 2015-12-08 DIAGNOSIS — E872 Acidosis: Secondary | ICD-10-CM | POA: Diagnosis present

## 2015-12-08 DIAGNOSIS — G9340 Encephalopathy, unspecified: Secondary | ICD-10-CM

## 2015-12-08 DIAGNOSIS — J9600 Acute respiratory failure, unspecified whether with hypoxia or hypercapnia: Secondary | ICD-10-CM

## 2015-12-08 DIAGNOSIS — J440 Chronic obstructive pulmonary disease with acute lower respiratory infection: Principal | ICD-10-CM | POA: Diagnosis present

## 2015-12-08 DIAGNOSIS — Z881 Allergy status to other antibiotic agents status: Secondary | ICD-10-CM | POA: Diagnosis not present

## 2015-12-08 DIAGNOSIS — Z4659 Encounter for fitting and adjustment of other gastrointestinal appliance and device: Secondary | ICD-10-CM

## 2015-12-08 DIAGNOSIS — N19 Unspecified kidney failure: Secondary | ICD-10-CM | POA: Diagnosis present

## 2015-12-08 DIAGNOSIS — H9190 Unspecified hearing loss, unspecified ear: Secondary | ICD-10-CM | POA: Diagnosis present

## 2015-12-08 DIAGNOSIS — A419 Sepsis, unspecified organism: Secondary | ICD-10-CM

## 2015-12-08 DIAGNOSIS — Z882 Allergy status to sulfonamides status: Secondary | ICD-10-CM

## 2015-12-08 DIAGNOSIS — Z880 Allergy status to penicillin: Secondary | ICD-10-CM | POA: Diagnosis not present

## 2015-12-08 DIAGNOSIS — Z6831 Body mass index (BMI) 31.0-31.9, adult: Secondary | ICD-10-CM

## 2015-12-08 DIAGNOSIS — E876 Hypokalemia: Secondary | ICD-10-CM | POA: Diagnosis not present

## 2015-12-08 DIAGNOSIS — E870 Hyperosmolality and hypernatremia: Secondary | ICD-10-CM | POA: Diagnosis present

## 2015-12-08 DIAGNOSIS — D539 Nutritional anemia, unspecified: Secondary | ICD-10-CM | POA: Diagnosis present

## 2015-12-08 DIAGNOSIS — E669 Obesity, unspecified: Secondary | ICD-10-CM | POA: Diagnosis present

## 2015-12-08 DIAGNOSIS — I2510 Atherosclerotic heart disease of native coronary artery without angina pectoris: Secondary | ICD-10-CM | POA: Diagnosis present

## 2015-12-08 DIAGNOSIS — N170 Acute kidney failure with tubular necrosis: Secondary | ICD-10-CM | POA: Diagnosis present

## 2015-12-08 DIAGNOSIS — E785 Hyperlipidemia, unspecified: Secondary | ICD-10-CM | POA: Diagnosis present

## 2015-12-08 DIAGNOSIS — N184 Chronic kidney disease, stage 4 (severe): Secondary | ICD-10-CM | POA: Diagnosis present

## 2015-12-08 DIAGNOSIS — F17210 Nicotine dependence, cigarettes, uncomplicated: Secondary | ICD-10-CM | POA: Diagnosis present

## 2015-12-08 DIAGNOSIS — Z978 Presence of other specified devices: Secondary | ICD-10-CM

## 2015-12-08 DIAGNOSIS — J449 Chronic obstructive pulmonary disease, unspecified: Secondary | ICD-10-CM | POA: Diagnosis not present

## 2015-12-08 DIAGNOSIS — E46 Unspecified protein-calorie malnutrition: Secondary | ICD-10-CM | POA: Diagnosis present

## 2015-12-08 DIAGNOSIS — Z79899 Other long term (current) drug therapy: Secondary | ICD-10-CM

## 2015-12-08 LAB — PROTIME-INR
INR: 1.23 (ref 0.00–1.49)
PROTHROMBIN TIME: 15.2 s (ref 11.6–15.2)

## 2015-12-08 LAB — LACTIC ACID, PLASMA
Lactic Acid, Venous: 0.9 mmol/L (ref 0.5–2.0)
Lactic Acid, Venous: 0.8 mmol/L (ref 0.5–2.0)

## 2015-12-08 LAB — HEPATIC FUNCTION PANEL
ALBUMIN: 2.3 g/dL — AB (ref 3.5–5.0)
ALK PHOS: 112 U/L (ref 38–126)
ALT: 52 U/L (ref 14–54)
AST: 85 U/L — ABNORMAL HIGH (ref 15–41)
BILIRUBIN TOTAL: 0.8 mg/dL (ref 0.3–1.2)
Bilirubin, Direct: 0.1 mg/dL (ref 0.1–0.5)
Indirect Bilirubin: 0.7 mg/dL (ref 0.3–0.9)
Total Protein: 7.1 g/dL (ref 6.5–8.1)

## 2015-12-08 LAB — I-STAT CG4 LACTIC ACID, ED
LACTIC ACID, VENOUS: 1.1 mmol/L (ref 0.5–2.0)
LACTIC ACID, VENOUS: 0.78 mmol/L (ref 0.5–2.0)

## 2015-12-08 LAB — TROPONIN I
TROPONIN I: 0.03 ng/mL (ref <0.031)
Troponin I: 0.08 ng/mL — ABNORMAL HIGH (ref <0.031)
Troponin I: 0.03 ng/mL (ref <0.031)

## 2015-12-08 LAB — CBC
HCT: 34.5 % — ABNORMAL LOW (ref 36.0–46.0)
Hemoglobin: 11.4 g/dL — ABNORMAL LOW (ref 12.0–15.0)
MCH: 33.4 pg (ref 26.0–34.0)
MCHC: 33 g/dL (ref 30.0–36.0)
MCV: 101.2 fL — AB (ref 78.0–100.0)
PLATELETS: 183 10*3/uL (ref 150–400)
RBC: 3.41 MIL/uL — AB (ref 3.87–5.11)
RDW: 14.2 % (ref 11.5–15.5)
WBC: 23.8 10*3/uL — ABNORMAL HIGH (ref 4.0–10.5)

## 2015-12-08 LAB — MAGNESIUM: Magnesium: 1.9 mg/dL (ref 1.7–2.4)

## 2015-12-08 LAB — BASIC METABOLIC PANEL
Anion gap: 11 (ref 5–15)
BUN: 81 mg/dL — AB (ref 6–20)
CHLORIDE: 105 mmol/L (ref 101–111)
CO2: 20 mmol/L — AB (ref 22–32)
CREATININE: 4.45 mg/dL — AB (ref 0.44–1.00)
Calcium: 9.5 mg/dL (ref 8.9–10.3)
GFR calc Af Amer: 12 mL/min — ABNORMAL LOW (ref >60)
GFR calc non Af Amer: 10 mL/min — ABNORMAL LOW (ref >60)
GLUCOSE: 127 mg/dL — AB (ref 65–99)
Potassium: 5.1 mmol/L (ref 3.5–5.1)
Sodium: 136 mmol/L (ref 135–145)

## 2015-12-08 LAB — INFLUENZA PANEL BY PCR (TYPE A & B)
H1N1 flu by pcr: NOT DETECTED
Influenza A By PCR: NEGATIVE
Influenza B By PCR: NEGATIVE

## 2015-12-08 LAB — GLUCOSE, CAPILLARY
Glucose-Capillary: 156 mg/dL — ABNORMAL HIGH (ref 65–99)
Glucose-Capillary: 200 mg/dL — ABNORMAL HIGH (ref 65–99)

## 2015-12-08 LAB — VALPROIC ACID LEVEL: VALPROIC ACID LVL: 43 ug/mL — AB (ref 50.0–100.0)

## 2015-12-08 LAB — BRAIN NATRIURETIC PEPTIDE: B Natriuretic Peptide: 556.7 pg/mL — ABNORMAL HIGH (ref 0.0–100.0)

## 2015-12-08 LAB — APTT: aPTT: 32 seconds (ref 24–37)

## 2015-12-08 LAB — CBG MONITORING, ED: Glucose-Capillary: 123 mg/dL — ABNORMAL HIGH (ref 65–99)

## 2015-12-08 LAB — PROCALCITONIN: PROCALCITONIN: 1.12 ng/mL

## 2015-12-08 MED ORDER — GUAIFENESIN ER 600 MG PO TB12
600.0000 mg | ORAL_TABLET | Freq: Two times a day (BID) | ORAL | Status: DC
  Administered 2015-12-08 – 2015-12-13 (×4): 600 mg via ORAL
  Filled 2015-12-08 (×11): qty 1

## 2015-12-08 MED ORDER — ACETAMINOPHEN 650 MG RE SUPP: 650.0000 mg | Freq: Four times a day (QID) | RECTAL | Status: DC | PRN

## 2015-12-08 MED ORDER — ACETAMINOPHEN 325 MG PO TABS: 650.0000 mg | ORAL_TABLET | Freq: Four times a day (QID) | ORAL | Status: DC | PRN

## 2015-12-08 MED ORDER — SODIUM CHLORIDE 0.9 % IV BOLUS (SEPSIS)
500.0000 mL | Freq: Once | INTRAVENOUS | Status: AC
  Administered 2015-12-08: 500 mL via INTRAVENOUS

## 2015-12-08 MED ORDER — VANCOMYCIN HCL IN DEXTROSE 1-5 GM/200ML-% IV SOLN: 1000.0000 mg | INTRAVENOUS | Status: DC

## 2015-12-08 MED ORDER — SODIUM CHLORIDE 0.9 % IV SOLN
250.0000 mg | INTRAVENOUS | Status: AC
  Administered 2015-12-08: 250 mg via INTRAVENOUS
  Filled 2015-12-08: qty 250

## 2015-12-08 MED ORDER — HYDROXYZINE HCL 25 MG PO TABS: 25.0000 mg | ORAL_TABLET | Freq: Two times a day (BID) | ORAL | Status: DC | PRN

## 2015-12-08 MED ORDER — SODIUM CHLORIDE 0.9 % IV BOLUS (SEPSIS): 500.0000 mL | Freq: Once | INTRAVENOUS | Status: DC

## 2015-12-08 MED ORDER — LEVOFLOXACIN IN D5W 750 MG/150ML IV SOLN
750.0000 mg | Freq: Once | INTRAVENOUS | Status: DC
  Administered 2015-12-08: 750 mg via INTRAVENOUS
  Filled 2015-12-08: qty 150

## 2015-12-08 MED ORDER — ALBUTEROL (5 MG/ML) CONTINUOUS INHALATION SOLN
10.0000 mg/h | INHALATION_SOLUTION | Freq: Once | RESPIRATORY_TRACT | Status: AC
  Administered 2015-12-08: 10 mg/h via RESPIRATORY_TRACT
  Filled 2015-12-08: qty 20

## 2015-12-08 MED ORDER — SODIUM CHLORIDE 0.9 % IV SOLN: INTRAVENOUS | Status: AC

## 2015-12-08 MED ORDER — IPRATROPIUM BROMIDE 0.02 % IN SOLN
1.0000 mg | Freq: Once | RESPIRATORY_TRACT | Status: AC
  Administered 2015-12-08: 1 mg via RESPIRATORY_TRACT
  Filled 2015-12-08: qty 5

## 2015-12-08 MED ORDER — LEVOTHYROXINE SODIUM 75 MCG PO TABS
75.0000 ug | ORAL_TABLET | Freq: Every day | ORAL | Status: DC
  Administered 2015-12-10 – 2015-12-11 (×2): 75 ug via ORAL
  Filled 2015-12-08 (×4): qty 1

## 2015-12-08 MED ORDER — DIVALPROEX SODIUM 500 MG PO DR TAB
500.0000 mg | DELAYED_RELEASE_TABLET | ORAL | Status: DC
  Administered 2015-12-10 – 2015-12-13 (×3): 500 mg via ORAL
  Filled 2015-12-08 (×11): qty 1

## 2015-12-08 MED ORDER — ONDANSETRON HCL 4 MG PO TABS: 4.0000 mg | ORAL_TABLET | Freq: Four times a day (QID) | ORAL | Status: DC | PRN

## 2015-12-08 MED ORDER — TIOTROPIUM BROMIDE MONOHYDRATE 18 MCG IN CAPS
18.0000 ug | ORAL_CAPSULE | Freq: Every day | RESPIRATORY_TRACT | Status: DC
  Administered 2015-12-13: 18 ug via RESPIRATORY_TRACT
  Filled 2015-12-08: qty 5

## 2015-12-08 MED ORDER — SODIUM CHLORIDE 0.9 % IV SOLN
250.0000 mg | Freq: Two times a day (BID) | INTRAVENOUS | Status: DC
  Filled 2015-12-08 (×2): qty 250

## 2015-12-08 MED ORDER — CARVEDILOL 3.125 MG PO TABS
3.1250 mg | ORAL_TABLET | Freq: Every morning | ORAL | Status: DC
  Administered 2015-12-10 – 2015-12-11 (×2): 3.125 mg via ORAL
  Filled 2015-12-08 (×3): qty 1

## 2015-12-08 MED ORDER — AMANTADINE HCL 100 MG PO CAPS
100.0000 mg | ORAL_CAPSULE | Freq: Two times a day (BID) | ORAL | Status: DC
  Administered 2015-12-08 – 2015-12-13 (×6): 100 mg via ORAL
  Filled 2015-12-08 (×11): qty 1

## 2015-12-08 MED ORDER — HALOPERIDOL 5 MG PO TABS
5.0000 mg | ORAL_TABLET | Freq: Two times a day (BID) | ORAL | Status: DC
  Administered 2015-12-08 – 2015-12-11 (×4): 5 mg via ORAL
  Filled 2015-12-08 (×10): qty 1

## 2015-12-08 MED ORDER — ARIPIPRAZOLE 5 MG PO TABS
15.0000 mg | ORAL_TABLET | ORAL | Status: DC
  Administered 2015-12-08 – 2015-12-25 (×29): 15 mg via ORAL
  Filled 2015-12-08 (×2): qty 3
  Filled 2015-12-08: qty 1
  Filled 2015-12-08 (×2): qty 3
  Filled 2015-12-08 (×2): qty 1
  Filled 2015-12-08: qty 3
  Filled 2015-12-08: qty 1
  Filled 2015-12-08: qty 3
  Filled 2015-12-08: qty 1
  Filled 2015-12-08 (×4): qty 3
  Filled 2015-12-08: qty 1
  Filled 2015-12-08 (×5): qty 3
  Filled 2015-12-08 (×6): qty 1
  Filled 2015-12-08 (×2): qty 3
  Filled 2015-12-08 (×2): qty 1
  Filled 2015-12-08 (×7): qty 3

## 2015-12-08 MED ORDER — OXYCODONE HCL 5 MG PO TABS: 5.0000 mg | ORAL_TABLET | ORAL | Status: DC | PRN

## 2015-12-08 MED ORDER — MOMETASONE FURO-FORMOTEROL FUM 200-5 MCG/ACT IN AERO
2.0000 | INHALATION_SPRAY | Freq: Two times a day (BID) | RESPIRATORY_TRACT | Status: DC
  Filled 2015-12-08: qty 8.8

## 2015-12-08 MED ORDER — ONDANSETRON HCL 4 MG/2ML IJ SOLN: 4.0000 mg | Freq: Four times a day (QID) | INTRAMUSCULAR | Status: DC | PRN

## 2015-12-08 MED ORDER — GABAPENTIN 400 MG PO CAPS
400.0000 mg | ORAL_CAPSULE | ORAL | Status: DC
  Administered 2015-12-08 – 2015-12-10 (×2): 400 mg via ORAL
  Filled 2015-12-08 (×6): qty 1

## 2015-12-08 MED ORDER — ALBUTEROL SULFATE (2.5 MG/3ML) 0.083% IN NEBU: 2.5000 mg | INHALATION_SOLUTION | RESPIRATORY_TRACT | Status: DC

## 2015-12-08 MED ORDER — SODIUM CHLORIDE 0.9% FLUSH
3.0000 mL | Freq: Two times a day (BID) | INTRAVENOUS | Status: DC
  Administered 2015-12-09 – 2015-12-19 (×9): 3 mL via INTRAVENOUS

## 2015-12-08 MED ORDER — METHYLPREDNISOLONE SODIUM SUCC 125 MG IJ SOLR
125.0000 mg | Freq: Once | INTRAMUSCULAR | Status: AC
  Administered 2015-12-08: 125 mg via INTRAVENOUS
  Filled 2015-12-08: qty 2

## 2015-12-08 MED ORDER — DIVALPROEX SODIUM 250 MG PO DR TAB
750.0000 mg | DELAYED_RELEASE_TABLET | Freq: Every day | ORAL | Status: DC
  Administered 2015-12-08 – 2015-12-12 (×3): 750 mg via ORAL
  Filled 2015-12-08 (×6): qty 3

## 2015-12-08 MED ORDER — LEVALBUTEROL HCL 0.63 MG/3ML IN NEBU: 0.6300 mg | INHALATION_SOLUTION | Freq: Four times a day (QID) | RESPIRATORY_TRACT | Status: DC | PRN

## 2015-12-08 MED ORDER — ENSURE ENLIVE PO LIQD
237.0000 mL | Freq: Two times a day (BID) | ORAL | Status: DC
  Administered 2015-12-10: 237 mL via ORAL

## 2015-12-08 MED ORDER — SODIUM CHLORIDE 0.9 % IV SOLN
INTRAVENOUS | Status: DC
  Administered 2015-12-09 – 2015-12-12 (×5): via INTRAVENOUS

## 2015-12-08 MED ORDER — CALCITRIOL 0.25 MCG PO CAPS
0.2500 ug | ORAL_CAPSULE | ORAL | Status: DC
  Administered 2015-12-10: 0.25 ug via ORAL
  Filled 2015-12-08 (×2): qty 1

## 2015-12-08 MED ORDER — ENOXAPARIN SODIUM 40 MG/0.4ML ~~LOC~~ SOLN
40.0000 mg | SUBCUTANEOUS | Status: DC
  Filled 2015-12-08: qty 0.4

## 2015-12-08 MED ORDER — ALBUTEROL SULFATE (2.5 MG/3ML) 0.083% IN NEBU
2.5000 mg | INHALATION_SOLUTION | RESPIRATORY_TRACT | Status: DC
  Administered 2015-12-08 – 2015-12-10 (×13): 2.5 mg via RESPIRATORY_TRACT
  Filled 2015-12-08 (×13): qty 3

## 2015-12-08 MED ORDER — ATORVASTATIN CALCIUM 40 MG PO TABS
40.0000 mg | ORAL_TABLET | Freq: Every day | ORAL | Status: DC
  Administered 2015-12-08: 40 mg via ORAL
  Filled 2015-12-08 (×4): qty 1

## 2015-12-08 MED ORDER — BENZTROPINE MESYLATE 1 MG PO TABS
0.5000 mg | ORAL_TABLET | Freq: Two times a day (BID) | ORAL | Status: DC
  Administered 2015-12-08 – 2015-12-25 (×29): 0.5 mg via ORAL
  Filled 2015-12-08 (×37): qty 1

## 2015-12-08 MED ORDER — INSULIN ASPART 100 UNIT/ML ~~LOC~~ SOLN
0.0000 [IU] | Freq: Three times a day (TID) | SUBCUTANEOUS | Status: DC
  Administered 2015-12-09: 2 [IU] via SUBCUTANEOUS
  Administered 2015-12-09: 1 [IU] via SUBCUTANEOUS
  Administered 2015-12-09 – 2015-12-10 (×2): 2 [IU] via SUBCUTANEOUS
  Administered 2015-12-10: 1 [IU] via SUBCUTANEOUS
  Administered 2015-12-10: 2 [IU] via SUBCUTANEOUS
  Administered 2015-12-11 (×3): 1 [IU] via SUBCUTANEOUS

## 2015-12-08 MED ORDER — ARIPIPRAZOLE ER 400 MG IM SUSR
400.0000 mg | INTRAMUSCULAR | Status: DC
  Filled 2015-12-08 (×3): qty 400

## 2015-12-08 MED ORDER — VANCOMYCIN HCL 10 G IV SOLR
1750.0000 mg | INTRAVENOUS | Status: DC
  Filled 2015-12-08: qty 1750

## 2015-12-08 MED ORDER — ENOXAPARIN SODIUM 30 MG/0.3ML ~~LOC~~ SOLN
30.0000 mg | SUBCUTANEOUS | Status: DC
  Administered 2015-12-09 – 2015-12-12 (×4): 30 mg via SUBCUTANEOUS
  Filled 2015-12-08 (×6): qty 0.3

## 2015-12-08 MED ORDER — POTASSIUM CHLORIDE IN NACL 20-0.9 MEQ/L-% IV SOLN
INTRAVENOUS | Status: DC
  Filled 2015-12-08: qty 1000

## 2015-12-08 MED ORDER — POLYETHYLENE GLYCOL 3350 17 G PO PACK
17.0000 g | PACK | Freq: Every day | ORAL | Status: DC | PRN
  Filled 2015-12-08: qty 1

## 2015-12-08 NOTE — H&P (Signed)
[facility] History and Physical  [name] [id number] DOB: 02-25-1961 DOA: 12/08/2015  Referring physician:  PCP: Anne Mccreedy, MD   Chief Complaint:  * Cough, weakness  HPI:    55 year old female with a history of COPD, hypertension, chronic kidney disease stage III, bipolar disorder, who presents to the ER because of malaise and shortness of breath and cough for the last 2 weeks. She has a history of COPD and smokes. Her MDI inhaler have not provided her any relief from her shortness of breath. She denies any fever chills, rigors. Patient has significant difficulty communicating because of her bipolar disorder. However she is awake alert but disoriented. He is requesting her medications repeatedly. Therefore the patient's history is obtained from the chart. Patient is a resident of group home [facility].  Workup in the ER showed white count of  23.8. Creatinine 4.45 up from a baseline of 2, potassium 5.1. Chest x-ray shows bilateral lower lobe atelectasis vs pneumonia.    Review of Systems: negative for the following  Constitutional: Denies fever, chills, di aphoresis, appetite change positive for malaise,fatigue.  HEENT: Denies photophobia, eye pain, redness, hearing loss, ear pain, congestion, sore throat, rhinorrhea, sneezing, mouth sores, trouble swallowing, neck pain, neck stiffness and tinnitus.  Res Positive for SOb , DOE, cough, chest tightness, and wheezing.  Cardiovascular: Denies chest pain, palpitations and leg swelling.  Gastrointestinal: Denies nausea, vomiting, abdominal pain, diarrhea, constipation, blood in stool and abdominal distention.  Genitourinary: Denies dysuria, urgency, frequency, hematuria, flank pain and difficulty urinating.  Musculoskeletal: Denies myalgias, back pain, joint swelling, arthralgias and gait problem.  Skin: Denies pallor, rash and wound.  Neurological: Denies dizziness, seizures, syncope,  weakness, light-headedness, numbness and headaches.  Hematological: Denies adenopathy. Easy bruising, personal or family bleeding history  Psychiatric/Behavioral: Denies suicidal ideation, mood changes, confusion, nervousness, sleep disturbance and agitation       Past Medical History  Diagnosis Date  . Anemia 07/10/2011  . Thrombocytopenia ([place]) 07/10/2011  . HTN (hypertension)   . COPD (chronic obstructive pulmonary disease) ([place])   . Hyperlipidemia   . Hypothyroidism   . Bipolar affective ([place])   . DM (diabetes mellitus) ([place])   . Renal insufficiency   . 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 Hypertension      Past Surgical History  Procedure Laterality Date  . Ankle surgery Left 1989  . Tonsillectomy  AGE 82  . Breast surgery Right 2010    BREAST  . Ventral hernia repair N/A 06/06/2013    Procedure:  OPEN VENTRAL HERNIA REPAIR;  Surgeon: Imogene Burn. Georgette Dover, MD;  Location: [facility];  Service: General;  Laterality: N/A;  . Insertion of mesh N/A 06/06/2013    Procedure: INSERTION OF MESH;  Surgeon: Imogene Burn. Georgette Dover, MD;  Location: [facility];  Service: General;  Laterality: N/A;      Social History:  reports that she has been smoking Cigarettes and Cigars.  She has a 20 pack-year smoking history. She has never used smokeless tobacco. She reports that she does not drink alcohol or use illicit drugs.    Allergies  Allergen Reactions  . Doxycycline Other (See Comments)    REACTION: Unknown reaction  . Glipizide Other (See Comments)    unknown  . Ibuprofen Other (See Comments)    CANNOT TAKE IBUPROFEN OR MOTRIN DUE TO CURRENT MEDS  . Penicillins Other (See Comments)    Has patient had a PCN  reaction causing immediate rash, facial/tongue/throat swelling, SOB or lightheadedness with hypotension: unknown Has patient had a PCN reaction causing severe rash involving mucus membranes or skin necrosis: unknown Has patient had a PCN reaction that required  hospitalization Unknown Has patient had a PCN reaction occurring within the last 10 years: Unknown If all of the above answers are "NO", then may proceed with Cephalosporin use.   . Sulfonamide Derivatives     REACTION: Unknown reaction  . Tetracycline Other (See Comments)    REACTION: Unknown reaction  . Tolterodine Tartrate Itching  . Chlorostat [Chlorhexidine] Rash  . Cogentin [Benztropine] Rash  . Oxybutynin Chloride Itching and Rash    Family History  Problem Relation Age of Onset  . Diabetes Mother   . Cancer Father   . HIV Brother          Prior to Admission medications   Medication Sig Start Date End Date Taking? Authorizing Provider  acetaminophen (TYLENOL) 500 MG tablet Take 1 tablet (500 mg total) by mouth every 6 (six) hours as needed. 08/16/15   Carlisle Cater, PA-C  albuterol (PROVENTIL HFA;VENTOLIN HFA) 108 (90 BASE) MCG/ACT inhaler Inhale 1-2 puffs into the lungs every 6 (six) hours as needed for wheezing or shortness of breath.    Historical Provider, MD  amantadine (SYMMETREL) 100 MG capsule Take 100 mg by mouth 2 (two) times daily.    Historical Provider, MD  ARIPiprazole (ABILIFY) 15 MG tablet Take 1 tablet (15 mg total) by mouth 2 (two) times daily in the am and at bedtime.. 07/14/15   Benjamine Mola, FNP  ARIPiprazole 400 MG SUSR Inject 400 mg into the muscle every 28 (twenty-eight) days. To be given at office. 08/07/15   Benjamine Mola, FNP  atorvastatin (LIPITOR) 40 MG tablet Take 1 tablet (40 mg total) by mouth at bedtime. 07/14/15   Benjamine Mola, FNP  benztropine (COGENTIN) 0.5 MG tablet Take 1 tablet (0.5 mg total) by mouth 2 (two) times daily. 07/14/15   Benjamine Mola, FNP  calcitRIOL (ROCALTROL) 0.25 MCG capsule Take 0.25 mcg by mouth as directed. Take 0.25mg  daily on Monday Wednesday and Friday    Historical Provider, MD  carvedilol (COREG) 3.125 MG tablet Take 1 tablet (3.125 mg total) by mouth every morning. 07/14/15   Benjamine Mola, FNP   divalproex (DEPAKOTE) 250 MG DR tablet Take 3 tablets (750 mg total) by mouth at bedtime. 07/14/15   Benjamine Mola, FNP  divalproex (DEPAKOTE) 500 MG DR tablet Take 1 tablet (500 mg total) by mouth 2 (two) times daily at 8am and 3pm. Patient taking differently: Take 1,000 mg by mouth 2 (two) times daily.  07/14/15   Benjamine Mola, FNP  Fluticasone-Salmeterol (ADVAIR) 250-50 MCG/DOSE AEPB Inhale 1 puff into the lungs 2 (two) times daily. 07/14/15   Benjamine Mola, FNP  gabapentin (NEURONTIN) 400 MG capsule Take 1 capsule (400 mg total) by mouth 3 (three) times daily at 8am, 3pm and bedtime. 07/14/15   Benjamine Mola, FNP  haloperidol (HALDOL) 5 MG tablet Take 5 mg by mouth 2 (two) times daily.    Historical Provider, MD  hydrocortisone cream 1 % Apply topically 3 (three) times daily. 07/14/15   Benjamine Mola, FNP  hydrOXYzine (ATARAX/VISTARIL) 25 MG tablet Take 1 tablet (25 mg total) by mouth 2 (two) times daily as needed for anxiety. 07/14/15   Benjamine Mola, FNP  levothyroxine (SYNTHROID, LEVOTHROID) 75 MCG tablet Take 1 tablet (75 mcg total)  by mouth daily before breakfast. 07/14/15   Benjamine Mola, FNP  meloxicam (MOBIC) 7.5 MG tablet Take 7.5 mg by mouth daily.    Historical Provider, MD  Multiple Vitamin (MULTIVITAMIN WITH MINERALS) TABS tablet Take 1 tablet by mouth daily. 07/14/15   Benjamine Mola, FNP  nystatin ointment (MYCOSTATIN) Apply topically 2 (two) times daily. 07/14/15   Benjamine Mola, FNP  ramipril (ALTACE) 2.5 MG capsule Take 1 capsule (2.5 mg total) by mouth every morning. 07/14/15   Benjamine Mola, FNP  zolpidem (AMBIEN) 10 MG tablet Take 1 tablet (10 mg total) by mouth at bedtime. 07/14/15 08/16/15  Benjamine Mola, FNP     Physical Exam: Filed Vitals:   12/08/15 1138 12/08/15 1215 12/08/15 1226  BP: 125/64    Pulse: 101 94 95  Temp: 98.6 F (37 C)    TempSrc: Oral    Resp: 18  23  SpO2: 92% 96% 93%     Constitutional: Vital signs reviewed. Patient is a  well-developed and well-nourished in no acute distress and cooperative with exam. Alert and oriented x3.  Head: Normocephalic and atraumatic  Ear: TM normal bilaterally  Mouth: no erythema or exudates, MMM  Eyes: PERRL, EOMI, conjunctivae normal, No scleral icterus.  Neck: Supple, Trachea midline normal ROM, No JVD, mass, thyromegaly, or carotid bruit present.  Cardiovascular: RRR, S1 normal, S2 normal, no MRG, pulses symmetric and intact bilaterally  Pulmonary/Chest: CTAB, no wheezes, rales, or rhonchi  Abdominal: Soft. Non-tender, non-distended, bowel sounds are normal, no masses, organomegaly, or guarding present.  GU: no CVA tenderness Musculoskeletal: No joint deformities, erythema, or stiffness, ROM full and no nontender Ext: no edema and no cyanosis, pulses palpable bilaterally (DP and PT)  Hematology: no cervical, inginal, or axillary adenopathy.  Neurological: A&O x3, Strenght is normal and symmetric bilaterally, cranial nerve II-XII are grossly intact, no focal motor deficit, sensory intact to light touch bilaterally.  Skin: Warm, dry and intact. No rash, cyanosis, or clubbing.  Psychiatric: Normal mood and affect. speech and behavior is normal. Judgment and thought content normal. Cognition and memory are normal.      Data Review   Micro Results No results found for this or any previous visit (from the past 240 hour(s)).  Radiology Reports Dg Chest Port 1 View  12/08/2015  CLINICAL DATA:  Mild lays, cough, shortness of breath for 2 days. COPD EXAM: PORTABLE CHEST 1 VIEW COMPARISON:  06/23/2015 FINDINGS: Mild cardiomegaly. Patchy bilateral lower lobe airspace opacities. No visible effusions. No acute bony abnormality. IMPRESSION: Patchy bilateral lower lobe atelectasis or pneumonia. Mild cardiomegaly. Electronically Signed   By: Rolm Baptise M.D.   On: 12/08/2015 12:40     CBC  Recent Labs Lab 12/08/15 1205  WBC 23.8*  HGB 11.4*  HCT 34.5*  PLT 183  MCV 101.2*  MCH  33.4  MCHC 33.0  RDW 14.2    Chemistries   Recent Labs Lab 12/08/15 1205  NA 136  K 5.1  CL 105  CO2 20*  GLUCOSE 127*  BUN 81*  CREATININE 4.45*  CALCIUM 9.5   ------------------------------------------------------------------------------------------------------------------ CrCl cannot be calculated (Unknown ideal weight.). ------------------------------------------------------------------------------------------------------------------ No results for input(s): HGBA1C in the last 72 hours. ------------------------------------------------------------------------------------------------------------------ No results for input(s): CHOL, HDL, LDLCALC, TRIG, CHOLHDL, LDLDIRECT in the last 72 hours. ------------------------------------------------------------------------------------------------------------------ No results for input(s): TSH, T4TOTAL, T3FREE, THYROIDAB in the last 72 hours.  Invalid input(s): FREET3 ------------------------------------------------------------------------------------------------------------------ No results for input(s): VITAMINB12, FOLATE, FERRITIN, TIBC, IRON, RETICCTPCT in the last  72 hours.  Coagulation profile No results for input(s): INR, PROTIME in the last 168 hours.  No results for input(s): DDIMER in the last 72 hours.  Cardiac Enzymes  Recent Labs Lab 12/08/15 1205  TROPONINI 0.08*   ------------------------------------------------------------------------------------------------------------------ Invalid input(s): POCBNP   CBG:  Recent Labs Lab 12/08/15 1202  GLUCAP 123*       EKG: Independently reviewed.   Assessment/Plan HCAP Patient lives in a group home Pneumonia and sepsis workup initiated Has acute kidney injury and altered mental status, therefore meets sepsis criteria Urine strep/Legionella antigen, influenza, HIV antibody Start patient on broad-spectrum antibiotics, follow blood culture results is  negative 48 hours narrow coverage  Prediabetes-patient has also been started on Solu-Medrol, therefore Accu-Cheks and sliding scale insulin has been initiated, also check hemoglobin A1c  Acute kidney injury, chronic kidney disease stage III-baseline around 2.25 Patient will be hydrated with IV fluids Follow renal function closely, this is likely secondary to dehydration    HTN (hypertension)-hold ACE inhibitor, continue Coreg    COPD (chronic obstructive pulmonary disease) with exacerbation (HCC)-started on Solu-Medrol, nebulizer treatments, taper steroids when able     Bipolar disorder, current episode manic severe with psychotic features (HCC)-not psychotic but confused, hold Ambien, continue Vistaril, amantadine, Abilify, Depakote, check Depakote level        Code Status Orders        Start     Ordered   12/08/15 1518  Full code   Continuous     12/08/15 1519      Family Communication: bedside Disposition Plan: admit   Total time spent 55 minutes.Greater than 50% of this time was spent in counseling, explanation of diagnosis, planning of further management, and coordination of care  [facility] Pager [phone]  If 7PM-7AM, please contact night-coverage [email or web address] Password [email or web address] 12/08/2015, 3:22 PM

## 2015-12-08 NOTE — Clinical Social Work Note (Addendum)
Clinical Social Work Assessment  Patient Details  Name: [name] MRN: [id number] Date of Birth: [birth year]  Date of referral:  12/08/15               Reason for consult:                   Permission sought to share information with:    Permission granted to share information::     Name::        Agency::     Relationship::     Contact Information:     Housing/Transportation Living arrangements for the past 2 months:   (Unknown for sure- address from nurses's note states: [place].) Source of Information:   (Unable to assess patient- information was obtained from EDP) Patient Interpreter Needed:  None Criminal Activity/Legal Involvement Pertinent to Current Situation/Hospitalization:    Significant Relationships:  Other(Comment) (Unknown at this time) Lives with:  Other (Comment) (Unknown at this time) Do you feel safe going back to the place where you live?    Need for family participation in patient care:     Care giving concerns: Unknown   Facilities manager / plan: CSW received telephone call from [place] who states patient came in "filthy". EDP notes patient is not the best historian and she does not know if patient if from a facility or group home. Nurses note states patient lives at [place]. CSW looked up this address which showed a placed called [facility].   CSW attempted to speak with patient at bedside with no family present. CSW was unable to assess patient when asking questions. CSW was unable to understand what patient was saying when she was attempting to speak.   EDP states patient will have inpatient hospitalization. EDP would like for CSW on inpatient floor to ensure patient's safety when patient is discharged from the hospital.   Employment status:  Other (Comment) (Unknown at this time) Insurance information:  Medicare PT Recommendations:  Not assessed at this time Information / Referral to community resources:  Other (Comment Required)  (None provided)  Patient/Family's Response to care: Unknown at this time  Patient/Family's Understanding of and Emotional Response to Diagnosis, Current Treatment, and Prognosis: Unknown at this time  Emotional Assessment Appearance:  Appears older than stated age Attitude/Demeanor/Rapport:  Unable to Assess Affect (typically observed):  Unable to Assess Orientation:    Alcohol / Substance use:  Not Applicable Psych involvement (Current and /or in the community):     Discharge Needs  Concerns to be addressed:  Other (Comment Required (Unknown at this time) Readmission within the last 30 days:    Current discharge risk:  Other (Unknown at this time) Barriers to Discharge:  Other (Unknown at this time)   Guadelupe Sabin, LCSW 12/08/2015, 3:05 PM

## 2015-12-08 NOTE — Progress Notes (Signed)
[facility] ([facility]) consult entered traditional medicare COPD patient wtih 2 admissions and 3 ED visits in the last 6 months Present dx PNA

## 2015-12-08 NOTE — ED Provider Notes (Signed)
CSN: [id number]     Arrival date & time 12/08/15  1125 History   First MD Initiated Contact with Patient 12/08/15 1151     Chief Complaint  Patient presents with  . Weakness  . Cough     HPI Pt was seen at 1200.  Per pt, c/o gradual onset and worsening of persistent cough, wheezing and SOB for the past 2 weeks. Has been associated with generalized weakness.  Has been using home MDI without transient relief.  Denies CP/palpitations, no back pain, no abd pain, no N/V/D, no fevers, no rash, no focal motor weakness, no tingling/numbness in extremities.     Past Medical History  Diagnosis Date  . Anemia 07/10/2011  . Thrombocytopenia ([place]) 07/10/2011  . HTN (hypertension)   . COPD (chronic obstructive pulmonary disease) ([place])   . Hyperlipidemia   . Hypothyroidism   . Bipolar affective ([place])   . DM (diabetes mellitus) ([place])   . Renal insufficiency   . 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 Hypertension    Past Surgical History  Procedure Laterality Date  . Ankle surgery Left 1989  . Tonsillectomy  AGE 60  . Breast surgery Right 2010    BREAST  . Ventral hernia repair N/A 06/06/2013    Procedure:  OPEN VENTRAL HERNIA REPAIR;  Surgeon: Imogene Burn. Georgette Dover, MD;  Location: [facility];  Service: General;  Laterality: N/A;  . Insertion of mesh N/A 06/06/2013    Procedure: INSERTION OF MESH;  Surgeon: Imogene Burn. Georgette Dover, MD;  Location: [facility];  Service: General;  Laterality: N/A;   Family History  Problem Relation Age of Onset  . Diabetes Mother   . Cancer Father   . HIV Brother    Social History  Substance Use Topics  . Smoking status: Current Every Day Smoker -- 0.50 packs/day for 40 years    Types: Cigarettes, Cigars  . Smokeless tobacco: Never Used  . Alcohol Use: No    Review of Systems ROS: Statement: All systems negative except as marked or noted in the HPI; Constitutional: Negative for fever and chills. +generalized weakness.; ; Eyes: Negative for eye  pain, redness and discharge. ; ; ENMT: Negative for ear pain, hoarseness, nasal congestion, sinus pressure and sore throat. ; ; Cardiovascular: Negative for chest pain, palpitations, diaphoresis, and peripheral edema. ; ; Respiratory: +cough, wheezing, SOB. Negative for stridor. ; ; Gastrointestinal: Negative for nausea, vomiting, diarrhea, abdominal pain, blood in stool, hematemesis, jaundice and rectal bleeding. . ; ; Genitourinary: Negative for dysuria, flank pain and hematuria. ; ; Musculoskeletal: Negative for back pain and neck pain. Negative for swelling and trauma.; ; Skin: Negative for pruritus, rash, abrasions, blisters, bruising and skin lesion.; ; Neuro: Negative for headache, lightheadedness and neck stiffness. Negative for altered level of consciousness , altered mental status, extremity weakness, paresthesias, involuntary movement, seizure and syncope.      Allergies  Doxycycline; Glipizide; Ibuprofen; Penicillins; Sulfonamide derivatives; Tetracycline; Tolterodine tartrate; Chlorostat; Cogentin; and Oxybutynin chloride  Home Medications   Prior to Admission medications   Medication Sig Start Date End Date Taking? Authorizing Provider  acetaminophen (TYLENOL) 500 MG tablet Take 1 tablet (500 mg total) by mouth every 6 (six) hours as needed. 08/16/15   Carlisle Cater, PA-C  albuterol (PROVENTIL HFA;VENTOLIN HFA) 108 (90 BASE) MCG/ACT inhaler Inhale 1-2 puffs into the lungs every 6 (six) hours as needed for wheezing or shortness of breath.    Historical Provider, MD  ARIPiprazole (  ABILIFY) 15 MG tablet Take 1 tablet (15 mg total) by mouth 2 (two) times daily in the am and at bedtime.. 07/14/15   Benjamine Mola, FNP  ARIPiprazole 400 MG SUSR Inject 400 mg into the muscle every 28 (twenty-eight) days. To be given at office. 08/07/15   Benjamine Mola, FNP  atorvastatin (LIPITOR) 40 MG tablet Take 1 tablet (40 mg total) by mouth at bedtime. 07/14/15   Benjamine Mola, FNP  benztropine  (COGENTIN) 0.5 MG tablet Take 1 tablet (0.5 mg total) by mouth 2 (two) times daily. 07/14/15   Benjamine Mola, FNP  carvedilol (COREG) 3.125 MG tablet Take 1 tablet (3.125 mg total) by mouth every morning. 07/14/15   Benjamine Mola, FNP  divalproex (DEPAKOTE) 250 MG DR tablet Take 3 tablets (750 mg total) by mouth at bedtime. 07/14/15   Benjamine Mola, FNP  divalproex (DEPAKOTE) 500 MG DR tablet Take 1 tablet (500 mg total) by mouth 2 (two) times daily at 8am and 3pm. 07/14/15   Benjamine Mola, FNP  Fluticasone-Salmeterol (ADVAIR) 250-50 MCG/DOSE AEPB Inhale 1 puff into the lungs 2 (two) times daily. 07/14/15   Benjamine Mola, FNP  gabapentin (NEURONTIN) 400 MG capsule Take 1 capsule (400 mg total) by mouth 3 (three) times daily at 8am, 3pm and bedtime. 07/14/15   Benjamine Mola, FNP  hydrocortisone cream 1 % Apply topically 3 (three) times daily. 07/14/15   Benjamine Mola, FNP  hydrOXYzine (ATARAX/VISTARIL) 25 MG tablet Take 1 tablet (25 mg total) by mouth 2 (two) times daily as needed for anxiety. 07/14/15   Benjamine Mola, FNP  levothyroxine (SYNTHROID, LEVOTHROID) 75 MCG tablet Take 1 tablet (75 mcg total) by mouth daily before breakfast. 07/14/15   Benjamine Mola, FNP  Multiple Vitamin (MULTIVITAMIN WITH MINERALS) TABS tablet Take 1 tablet by mouth daily. 07/14/15   Benjamine Mola, FNP  nystatin ointment (MYCOSTATIN) Apply topically 2 (two) times daily. 07/14/15   Benjamine Mola, FNP  ramipril (ALTACE) 2.5 MG capsule Take 1 capsule (2.5 mg total) by mouth every morning. 07/14/15   Benjamine Mola, FNP  zolpidem (AMBIEN) 10 MG tablet Take 1 tablet (10 mg total) by mouth at bedtime. 07/14/15 08/16/15  Elyse Jarvis Withrow, FNP   BP 125/64 mmHg  Pulse 101  Temp(Src) 98.6 F (37 C) (Oral)  Resp 18  SpO2 92% Physical Exam 1205: Physical examination:  Nursing notes reviewed; Vital signs and O2 SAT reviewed;  Constitutional: Poor hygiene, disheveled. Well developed, Well nourished,  In no acute  distress; Head:  Normocephalic, atraumatic; Eyes: EOMI, PERRL, No scleral icterus; ENMT: Mouth and pharynx normal, Mucous membranes dry;; Neck: Supple, Full range of motion, No lymphadenopathy; Cardiovascular: Regular rate and rhythm, No gallop; Respiratory: Breath sounds coarse & equal bilaterally, scattered faint wheezes with occasional audible wheezing.  Speaking short phrases, +moist cough. Tachypneic; Chest: Nontender, Movement normal; Abdomen: Soft, Nontender, Nondistended, Normal bowel sounds; Genitourinary: No CVA tenderness; Extremities: Pulses normal, No tenderness, No edema, No calf edema or asymmetry.; Neuro: Awake, alert, confused re: time. Vague historian. No facial droop. Speech clear. Moves all extremities spontaneously without apparent gross focal motor deficits.; Skin: Color normal, Warm, Dry.   ED Course  Procedures (including critical care time) Labs Review  Imaging Review  I have personally reviewed and evaluated these images and lab results as part of my medical decision-making.   EKG Interpretation None      MDM  MDM Reviewed: previous chart,  nursing note and vitals Reviewed previous: labs and ECG Interpretation: labs, ECG and x-ray Total time providing critical care: 30-74 minutes. This excludes time spent performing separately reportable procedures and services. Consults: admitting MD   CRITICAL CARE Performed by: Alfonzo Feller Total critical care time: 35 minutes Critical care time was exclusive of separately billable procedures and treating other patients. Critical care was necessary to treat or prevent imminent or life-threatening deterioration. Critical care was time spent personally by me on the following activities: development of treatment plan with patient and/or surrogate as well as nursing, discussions with consultants, evaluation of patient's response to treatment, examination of patient, obtaining history from patient or surrogate, ordering and  performing treatments and interventions, ordering and review of laboratory studies, ordering and review of radiographic studies, pulse oximetry and re-evaluation of patient's condition.   ED ECG REPORT   Date: 12/08/2015  Rate: 98  Rhythm: normal sinus rhythm  QRS Axis: normal  Intervals: normal  ST/T Wave abnormalities: normal  Conduction Disutrbances:none  Narrative Interpretation:   Old EKG Reviewed: changes noted; rate slower compared to previous EKG dated 07/01/15; otherwise no significant changes.   Results for orders placed or performed during the hospital encounter of [id number]  Basic metabolic panel  Result Value Ref Range   Sodium 136 135 - 145 mmol/L   Potassium 5.1 3.5 - 5.1 mmol/L   Chloride 105 101 - 111 mmol/L   CO2 20 (L) 22 - 32 mmol/L   Glucose, Bld 127 (H) 65 - 99 mg/dL   BUN 81 (H) 6 - 20 mg/dL   Creatinine, Ser 4.45 (H) 0.44 - 1.00 mg/dL   Calcium 9.5 8.9 - 10.3 mg/dL   GFR calc non Af Amer 10 (L) >60 mL/min   GFR calc Af Amer 12 (L) >60 mL/min   Anion gap 11 5 - 15  CBC  Result Value Ref Range   WBC 23.8 (H) 4.0 - 10.5 K/uL   RBC 3.41 (L) 3.87 - 5.11 MIL/uL   Hemoglobin 11.4 (L) 12.0 - 15.0 g/dL   HCT 34.5 (L) 36.0 - 46.0 %   MCV 101.2 (H) 78.0 - 100.0 fL   MCH 33.4 26.0 - 34.0 pg   MCHC 33.0 30.0 - 36.0 g/dL   RDW 14.2 11.5 - 15.5 %   Platelets 183 150 - 400 K/uL  Troponin I  Result Value Ref Range   Troponin I 0.08 (H) <0.031 ng/mL  Brain natriuretic peptide  Result Value Ref Range   B Natriuretic Peptide 556.7 (H) 0.0 - 100.0 pg/mL  CBG monitoring, ED  Result Value Ref Range   Glucose-Capillary 123 (H) 65 - 99 mg/dL  I-Stat CG4 Lactic Acid, ED  Result Value Ref Range   Lactic Acid, Venous 1.10 0.5 - 2.0 mmol/L   Dg Chest Port 1 View 12/08/2015  CLINICAL DATA:  Mild lays, cough, shortness of breath for 2 days. COPD EXAM: PORTABLE CHEST 1 VIEW COMPARISON:  06/23/2015 FINDINGS: Mild cardiomegaly. Patchy bilateral lower lobe airspace opacities.  No visible effusions. No acute bony abnormality. IMPRESSION: Patchy bilateral lower lobe atelectasis or pneumonia. Mild cardiomegaly. Electronically Signed   By: Rolm Baptise M.D.   On: 12/08/2015 12:40     1215:  Initial O2 Sats in the 70's; increased to 99% when placed on O2 2L N/C. Will dose IV solumedrol and hour long neb.   1420:  After neb: pt appears more comfortable at rest, less tachypneic, Sats 93-96 % on O2 2L N/C, lungs continue coarse.  WBC count elevated and bilat infiltrates on CXR; will dose IV abx for CAP. BUN/Cr elevated from baseline; will dose judicious IVF. BNP elevated, but no acute CHF on CXR. T/C to [facility] Dr. Allyson Sabal, case discussed, including:  HPI, pertinent PM/SHx, VS/PE, dx testing, ED course and treatment:  Agreeable to admit, requests to write temporary orders, obtain tele bed to team [facility].     Francine Graven, DO 12/12/15 [phone]

## 2015-12-08 NOTE — ED Notes (Signed)
16:10 

## 2015-12-08 NOTE — ED Notes (Signed)
Pt lives at [place]

## 2015-12-08 NOTE — ED Notes (Signed)
16:25 

## 2015-12-08 NOTE — ED Notes (Addendum)
Pt from home with c/o malaise and cough x 2. Pt states she has a hx of COPD. Pt is alert x 3 (she is not alert to time). Pt has an active productive cough. Pt had equal strengths on each side Pt states she is from [facility] [employer]

## 2015-12-08 NOTE — Progress Notes (Addendum)
Pharmacy Antibiotic Note  [name] is a 55 y.o. female with PMH of COPD, HTN, CKD stage III, bipolar disorder admitted on 12/08/2015 with sepsis 2/2 HCAP.  Pharmacy has been consulted for Vancomycin and Primaxin dosing. Of note, MD is aware of penicillin allergy and is ok to proceed with Primaxin order with careful monitoring.   Plan: Vancomycin 1750mg  IV x 1, then 1000mg  IV q48h. Plan for Vancomycin trough level at steady state. Goal trough 15-20 mcg/mL. Primaxin 250mg  IV q12h. Monitor renal function, cultures, clinical course.  Weight: 190 lb (86.183 kg)  Temp (24hrs), Avg:98.6 F (37 C), Min:98.6 F (37 C), Max:98.6 F (37 C)   Recent Labs Lab 12/08/15 1205 12/08/15 1221 12/08/15 1533  WBC 23.8*  --   --   CREATININE 4.45*  --   --   LATICACIDVEN  --  1.10 0.78    Estimated Creatinine Clearance: 15.3 mL/min (by C-G formula based on Cr of 4.45).    Allergies  Allergen Reactions  . Doxycycline Other (See Comments)    REACTION: Unknown reaction  . Glipizide Other (See Comments)    unknown  . Ibuprofen Other (See Comments)    CANNOT TAKE IBUPROFEN OR MOTRIN DUE TO CURRENT MEDS  . Penicillins Other (See Comments)    Has patient had a PCN reaction causing immediate rash, facial/tongue/throat swelling, SOB or lightheadedness with hypotension: unknown Has patient had a PCN reaction causing severe rash involving mucus membranes or skin necrosis: unknown Has patient had a PCN reaction that required hospitalization Unknown Has patient had a PCN reaction occurring within the last 10 years: Unknown If all of the above answers are "NO", then may proceed with Cephalosporin use.   . Sulfonamide Derivatives     REACTION: Unknown reaction  . Tetracycline Other (See Comments)    REACTION: Unknown reaction  . Tolterodine Tartrate Itching  . Chlorostat [Chlorhexidine] Rash  . Cogentin [Benztropine] Rash  . Oxybutynin Chloride Itching and Rash    Antimicrobials this  admission: 4/10 >> Levofloxacin x 1 4/10 >> Vancomycin >> 4/10 >> Primaxin >>  Dose adjustments this admission: ---  Microbiology results: 4/10 BCx: sent 4/10 UCx: ordered  4/10 Sputum: ordered  4/10 Influenza panel: ordered 4/10 Strep pneumo urinary antigen: ordered 4/10 Legionella urinary antigen: ordered  Thank you for allowing pharmacy to be a part of this patient's care.   Lindell Spar, PharmD, BCPS Pager: [phone] 12/08/2015 4:12 PM

## 2015-12-08 NOTE — Progress Notes (Signed)
Pt is refusing new IV at this time. Pt has an Iv currently in [facility] but will not keep arm straight despite distraction. Will continue to monitor pt closely. [name] I

## 2015-12-09 ENCOUNTER — Inpatient Hospital Stay (HOSPITAL_COMMUNITY): Payer: Medicare Other

## 2015-12-09 DIAGNOSIS — E039 Hypothyroidism, unspecified: Secondary | ICD-10-CM

## 2015-12-09 DIAGNOSIS — J441 Chronic obstructive pulmonary disease with (acute) exacerbation: Secondary | ICD-10-CM

## 2015-12-09 DIAGNOSIS — I10 Essential (primary) hypertension: Secondary | ICD-10-CM

## 2015-12-09 LAB — CBC
HCT: 32.9 % — ABNORMAL LOW (ref 36.0–46.0)
Hemoglobin: 10.9 g/dL — ABNORMAL LOW (ref 12.0–15.0)
MCH: 32.6 pg (ref 26.0–34.0)
MCHC: 33.1 g/dL (ref 30.0–36.0)
MCV: 98.5 fL (ref 78.0–100.0)
PLATELETS: 136 10*3/uL — AB (ref 150–400)
RBC: 3.34 MIL/uL — ABNORMAL LOW (ref 3.87–5.11)
RDW: 13.8 % (ref 11.5–15.5)
WBC: 12.7 10*3/uL — ABNORMAL HIGH (ref 4.0–10.5)

## 2015-12-09 LAB — TROPONIN I: Troponin I: 0.03 ng/mL (ref <0.031)

## 2015-12-09 LAB — URINALYSIS, ROUTINE W REFLEX MICROSCOPIC
Bilirubin Urine: NEGATIVE
Glucose, UA: NEGATIVE mg/dL
Ketones, ur: NEGATIVE mg/dL
LEUKOCYTES UA: NEGATIVE
NITRITE: NEGATIVE
Protein, ur: 100 mg/dL — AB
SPECIFIC GRAVITY, URINE: 1.012 (ref 1.005–1.030)
pH: 5.5 (ref 5.0–8.0)

## 2015-12-09 LAB — URINE MICROSCOPIC-ADD ON: WBC, UA: NONE SEEN WBC/hpf (ref 0–5)

## 2015-12-09 LAB — COMPREHENSIVE METABOLIC PANEL
ALBUMIN: 2 g/dL — AB (ref 3.5–5.0)
ALK PHOS: 94 U/L (ref 38–126)
ALT: 48 U/L (ref 14–54)
ANION GAP: 9 (ref 5–15)
AST: 66 U/L — AB (ref 15–41)
BILIRUBIN TOTAL: 0.4 mg/dL (ref 0.3–1.2)
BUN: 92 mg/dL — AB (ref 6–20)
CALCIUM: 9.7 mg/dL (ref 8.9–10.3)
CO2: 20 mmol/L — ABNORMAL LOW (ref 22–32)
CREATININE: 4.3 mg/dL — AB (ref 0.44–1.00)
Chloride: 110 mmol/L (ref 101–111)
GFR calc Af Amer: 12 mL/min — ABNORMAL LOW (ref >60)
GFR calc non Af Amer: 11 mL/min — ABNORMAL LOW (ref >60)
GLUCOSE: 191 mg/dL — AB (ref 65–99)
Potassium: 5.2 mmol/L — ABNORMAL HIGH (ref 3.5–5.1)
Sodium: 139 mmol/L (ref 135–145)
TOTAL PROTEIN: 6.6 g/dL (ref 6.5–8.1)

## 2015-12-09 LAB — HEMOGLOBIN A1C
Hgb A1c MFr Bld: 6 % — ABNORMAL HIGH (ref 4.8–5.6)
MEAN PLASMA GLUCOSE: 126 mg/dL

## 2015-12-09 LAB — GLUCOSE, CAPILLARY
GLUCOSE-CAPILLARY: 149 mg/dL — AB (ref 65–99)
GLUCOSE-CAPILLARY: 152 mg/dL — AB (ref 65–99)
GLUCOSE-CAPILLARY: 152 mg/dL — AB (ref 65–99)
Glucose-Capillary: 164 mg/dL — ABNORMAL HIGH (ref 65–99)

## 2015-12-09 LAB — STREP PNEUMONIAE URINARY ANTIGEN: Strep Pneumo Urinary Antigen: NEGATIVE

## 2015-12-09 LAB — HIV ANTIBODY (ROUTINE TESTING W REFLEX): HIV SCREEN 4TH GENERATION: NONREACTIVE

## 2015-12-09 LAB — MRSA PCR SCREENING: MRSA by PCR: NEGATIVE

## 2015-12-09 MED ORDER — LEVOFLOXACIN IN D5W 500 MG/100ML IV SOLN
500.0000 mg | INTRAVENOUS | Status: DC
Start: 2015-12-10 — End: 2015-12-15
  Administered 2015-12-10 – 2015-12-14 (×3): 500 mg via INTRAVENOUS
  Filled 2015-12-09 (×3): qty 100

## 2015-12-09 MED ORDER — LORAZEPAM 2 MG/ML IJ SOLN
1.0000 mg | Freq: Once | INTRAMUSCULAR | Status: AC
  Administered 2015-12-09: 1 mg via INTRAMUSCULAR
  Filled 2015-12-09: qty 1

## 2015-12-09 MED ORDER — METHYLPREDNISOLONE SODIUM SUCC 125 MG IJ SOLR
60.0000 mg | Freq: Three times a day (TID) | INTRAMUSCULAR | Status: DC
  Administered 2015-12-09: 60 mg via INTRAVENOUS
  Filled 2015-12-09 (×2): qty 0.96

## 2015-12-09 MED ORDER — BUDESONIDE 0.25 MG/2ML IN SUSP
0.2500 mg | Freq: Two times a day (BID) | RESPIRATORY_TRACT | Status: DC
  Administered 2015-12-09 – 2015-12-17 (×15): 0.25 mg via RESPIRATORY_TRACT
  Filled 2015-12-09 (×18): qty 2

## 2015-12-09 MED ORDER — METHYLPREDNISOLONE SODIUM SUCC 125 MG IJ SOLR
60.0000 mg | Freq: Three times a day (TID) | INTRAMUSCULAR | Status: DC
  Administered 2015-12-10 – 2015-12-11 (×6): 60 mg via INTRAVENOUS
  Filled 2015-12-09 (×6): qty 0.96

## 2015-12-09 NOTE — Progress Notes (Signed)
Patient unable to take inhalers due to lack of understanding and ability to take appropriately at this time.

## 2015-12-09 NOTE — Progress Notes (Signed)
Initial Nutrition Assessment  DOCUMENTATION CODES:   Obesity unspecified  INTERVENTION:  - Encourage PO intakes when pt more awake/alert - RD will continue to monitor for needs  NUTRITION DIAGNOSIS:   Inadequate oral intake related to lethargy/confusion as evidenced by other (see comment) (RN report).  GOAL:   Patient will meet greater than or equal to 90% of their needs  MONITOR:   PO intake, Weight trends, Labs, I & O's  REASON FOR ASSESSMENT:   Malnutrition Screening Tool  ASSESSMENT:   55 year old female with a history of COPD, hypertension, chronic kidney disease stage III, bipolar disorder, who presents to the ER because of malaise and shortness of breath and cough for the last 2 weeks. She has a history of COPD and smokes. Her MDI inhaler have not provided her any relief from her shortness of breath. She denies any fever chills, rigors. Patient has significant difficulty communicating because of her bipolar disorder. However she is awake alert but disoriented. He is requesting her medications repeatedly. Therefore the patient's history is obtained from the chart. Patient is a resident of group home called [facility].  Pt seen for MST. BMI indicates obesity. No intakes documented since admission. Spoke with RN who reports that pt was given Ativan early AM and has been asleep since that time. RN unsure of pt's baseline. Visualized pt sleeping and no visible muscle or fat wasting but did not perform physical assessment as pt has bilateral mitten restraints and RN reports pt with hx of bipolar disorder who was not fully cooperative prior to Ativan dose. No visitors at bedside to provide information from PTA. Per chart review, pt has lost 28 lbs (13% body weight) in the past 4 months which is significant for time frame. Unable to confirm malnutrition at this time but will document further findings at follow-up.  Not meeting needs. RD will continue to follow and  provide interventions as warranted. Medications reviewed. IVF: NS @ 100 mL/hr. Labs reviewed; K: 5.2 mmol/L, BUN/creatinine elevated, GFR: 11.   Diet Order:  Diet Carb Modified Fluid consistency:: Thin; Room service appropriate?: Yes  Skin:  Reviewed, no issues  Last BM:  PTA  Height:   Ht Readings from Last 1 Encounters:  12/08/15 5\' 5"  (1.651 m)    Weight:   Wt Readings from Last 1 Encounters:  12/08/15 190 lb (86.183 kg)    Ideal Body Weight:  56.82 kg (kg)  BMI:  Body mass index is 31.62 kg/(m^2).  Estimated Nutritional Needs:   Kcal:  1550-1720 (18-20 kcal/kg)  Protein:  80-90 grams  Fluid:  2 L/day  EDUCATION NEEDS:   No education needs identified at this time    Jarome Matin, RD, LDN Inpatient Clinical Dietitian Pager # [phone] After hours/weekend pager # [phone]

## 2015-12-09 NOTE — Progress Notes (Signed)
Pt pulled out IV and keeps getting out of bed. [name], NP on call paged for orders. Will continue to monitor pt closely. [name]

## 2015-12-09 NOTE — Progress Notes (Signed)
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 -when more alert, will discussed weight loss and low calorie diet   Hypothyroidism  -continue synthroid   HLD: -continue statins   Code Status: Full Family Communication: no family at bedside  Disposition Plan: back to group home when medically stable; barriers mainly respiratory status.   Consultants:  None   Procedures:  See below for x-ray reports   Antibiotics:  levaquin 4/11  HPI/Subjective: Overnight was very agitated and combative; received ativan and is now very somnolent/sleepy. No grimaces when pressing her belly or chest. Diffuse wheezing and rhonchi. Needing O2 supplementation.  Objective: Filed Vitals:   12/09/15 0935 12/09/15 1055  BP: 143/87   Pulse: 105 108  Temp:    Resp: 24 22    Intake/Output Summary (Last 24 hours) at 12/09/15 1422 Last data filed at 12/09/15 1200  Gross per 24 hour  Intake      0 ml  Output      0 ml  Net      0 ml   Filed Weights   12/08/15 1546 12/08/15 1751  Weight: 86.183 kg (190 lb) 86.183 kg (190 lb)    Exam:   General:  Very sleepy and unable to follow commands properly or communicate. Unknown baseline (but patient with  chronic mood disorder and psychosis; lives in a group home)   Cardiovascular: S1 and S2, no rubs or gallops  Respiratory: diffuse wheezing and rhonchi, no crackles  Abdomen: soft, NT, positive BS  Musculoskeletal: trace edema bilaterally  Data Reviewed: Basic Metabolic Panel:  Recent Labs Lab 12/08/15 1205 12/08/15 1606 12/09/15 0516  NA 136  --  139  K 5.1  --  5.2*  CL 105  --  110  CO2 20*  --  20*  GLUCOSE 127*  --  191*  BUN 81*  --  92*  CREATININE 4.45*  --  4.30*  CALCIUM 9.5  --  9.7  MG  --  1.9  --    Liver Function Tests:  Recent Labs Lab 12/08/15 1530 12/09/15 0516  AST 85* 66*  ALT 52 48  ALKPHOS 112 94  BILITOT 0.8 0.4  PROT 7.1 6.6  ALBUMIN 2.3* 2.0*   CBC:  Recent Labs Lab 12/08/15 1205 12/09/15 0516  WBC 23.8* 12.7*  HGB 11.4* 10.9*  HCT 34.5* 32.9*  MCV 101.2* 98.5  PLT 183 136*   Cardiac Enzymes:  Recent Labs Lab 12/08/15 1205 12/08/15 1530 12/08/15 2125 12/09/15 0516  TROPONINI 0.08* 0.03 0.03 0.03   BNP (last 3 results)  Recent Labs  12/08/15 1205  BNP 556.7*   CBG:  Recent Labs Lab 12/08/15 1202 12/08/15 1736 12/08/15 2314 12/09/15 0748 12/09/15 1213  GLUCAP 123* 156* 200* 164* 149*    Recent Results (from the past 240 hour(s))  Culture, blood (routine x 2) Call  MD if unable to obtain prior to antibiotics being given     Status: None (Preliminary result)   Collection Time: 12/08/15  3:21 PM  Result Value Ref Range Status   Specimen Description BLOOD LEFT ANTECUBITAL  Final   Special Requests BOTTLES DRAWN AEROBIC AND ANAEROBIC 5CC  Final   Culture   Final    NO GROWTH < 24 HOURS Performed at [facility]    Report Status PENDING  Incomplete  Culture, blood (routine x 2) Call MD if unable to obtain prior to antibiotics being given     Status: None (Preliminary result)   Collection Time: 12/08/15  4:06 PM  Result Value Ref Range Status   Specimen Description BLOOD LEFT FOREARM  Final   Special  Requests IN PEDIATRIC BOTTLE [place]  Final   Culture   Final    NO GROWTH < 24 HOURS Performed at [facility]    Report Status PENDING  Incomplete  MRSA PCR Screening     Status: None   Collection Time: 12/08/15 10:52 PM  Result Value Ref Range Status   MRSA by PCR NEGATIVE NEGATIVE Final    Comment:        The GeneXpert MRSA Assay (FDA approved for NASAL specimens only), is one component of a comprehensive MRSA colonization surveillance program. It is not intended to diagnose MRSA infection nor to guide or monitor treatment for MRSA infections.      Studies: Dg Chest Port 1 View  12/09/2015  CLINICAL DATA:  Sepsis. EXAM: PORTABLE CHEST 1 VIEW COMPARISON:  12/08/2015. FINDINGS: Mediastinum hilar structures normal. Stable cardiomegaly. Interim partial clearing of bibasilar atelectasis and/or infiltrates. No pleural effusion or pneumothorax. IMPRESSION: 1 interim partial clearing of bibasilar atelectasis and/or infiltrates. 2. Cardiomegaly. Electronically Signed   By: Marcello Moores  Register   On: 12/09/2015 07:13   Dg Chest Port 1 View  12/08/2015  CLINICAL DATA:  Mild lays, cough, shortness of breath for 2 days. COPD EXAM: PORTABLE CHEST 1 VIEW COMPARISON:  06/23/2015 FINDINGS: Mild cardiomegaly. Patchy bilateral lower lobe airspace opacities. No visible effusions. No acute bony abnormality. IMPRESSION: Patchy bilateral lower lobe atelectasis or pneumonia. Mild cardiomegaly. Electronically Signed   By: Rolm Baptise M.D.   On: 12/08/2015 12:40    Scheduled Meds: . sodium chloride   Intravenous STAT  . albuterol  2.5 mg Nebulization Q4H  . amantadine  100 mg Oral BID  . ARIPiprazole  15 mg Oral BH-qamhs  . atorvastatin  40 mg Oral QHS  . benztropine  0.5 mg Oral BID  . budesonide (PULMICORT) nebulizer solution  0.25 mg Nebulization BID  . calcitRIOL  0.25 mcg Oral Once per day on Mon Wed Fri  . carvedilol  3.125 mg Oral q morning - 10a  . divalproex  500 mg Oral BH-q8a3p  .  divalproex  750 mg Oral QHS  . enoxaparin (LOVENOX) injection  30 mg Subcutaneous Q24H  . feeding supplement (ENSURE ENLIVE)  237 mL Oral BID BM  . gabapentin  400 mg Oral BH-[id number]  . guaiFENesin  600 mg Oral BID  . haloperidol  5 mg Oral BID  . insulin aspart  0-9 Units Subcutaneous TID WC  . [START ON 12/10/2015] levofloxacin (LEVAQUIN) IV  500 mg Intravenous Q48H  . levothyroxine  75 mcg Oral QAC breakfast  . methylPREDNISolone (SOLU-MEDROL) injection  60 mg Intravenous 3 times per day  . sodium chloride  500 mL Intravenous Once  . sodium chloride flush  3 mL  Intravenous Q12H  . tiotropium  18 mcg Inhalation Daily   Continuous Infusions: . sodium chloride 100 mL/hr at 12/09/15 1123    Active Problems:   HTN (hypertension)   COPD (chronic obstructive pulmonary disease) (HCC)   Chronic kidney disease   Bipolar disorder, current episode manic severe with psychotic features ([place])   Prediabetes   COPD with acute exacerbation ([place])   HCAP (healthcare-associated pneumonia)    Time spent: 33 minutes    Barton Dubois  [facility] Pager [phone]. If 7PM-7AM, please contact night-coverage at [email or web address], password [facility] 12/09/2015, 2:22 PM  LOS: 1 day

## 2015-12-09 NOTE — Progress Notes (Signed)
Pharmacy Antibiotic Note  [name] is a 55 y.o. female with renal insufficiency admitted on 12/08/2015 with HCAP/sepsis. Patient received 1 dose of Primaxin and 1 dose of levofloxacin on admission.  Patient pulled out IV line access line on 4/11 early morning and missed her vancomycin and Primaxin doses. Patient now has IV line access.  Per MD, change antibiotics to Levaquin for pneumonia.  Patient is afebrile, WBC elevated by down to 12.7, SCr down slightly to 4.30 (CrCl ~16).  Plan: - Levofloxacin 750 mg IV x1 dose given 4/10.  Start Levaquin 500 mg IV q48h on 4/12. - d/c vancomycin, Primaxin - F/u cultures and renal fxn.  ________________________________  Height: 5\' 5"  (165.1 cm) Weight: 190 lb (86.183 kg) IBW/kg (Calculated) : 57  Temp (24hrs), Avg:97.9 F (36.6 C), Min:97.5 F (36.4 C), Max:98.3 F (36.8 C)   Recent Labs Lab 12/08/15 1205 12/08/15 1221 12/08/15 1533 12/08/15 1606 12/08/15 1655 12/09/15 0516  WBC 23.8*  --   --   --   --  12.7*  CREATININE 4.45*  --   --   --   --  4.30*  LATICACIDVEN  --  1.10 0.78 0.9 0.8  --     Estimated Creatinine Clearance: 15.7 mL/min (by C-G formula based on Cr of 4.3).    Allergies  Allergen Reactions  . Doxycycline Other (See Comments)    REACTION: Unknown reaction  . Glipizide Other (See Comments)    unknown  . Ibuprofen Other (See Comments)    CANNOT TAKE IBUPROFEN OR MOTRIN DUE TO CURRENT MEDS  . Penicillins Other (See Comments)    Has patient had a PCN reaction causing immediate rash, facial/tongue/throat swelling, SOB or lightheadedness with hypotension: unknown Has patient had a PCN reaction causing severe rash involving mucus membranes or skin necrosis: unknown Has patient had a PCN reaction that required hospitalization Unknown Has patient had a PCN reaction occurring within the last 10 years: Unknown If all of the above answers are "NO", then may proceed with Cephalosporin use.   . Sulfonamide  Derivatives     REACTION: Unknown reaction  . Tetracycline Other (See Comments)    REACTION: Unknown reaction  . Tolterodine Tartrate Itching  . Chlorostat [Chlorhexidine] Rash  . Cogentin [Benztropine] Rash  . Oxybutynin Chloride Itching and Rash    Antimicrobials this admission: 4/10 levofloxacin >> 4/10 vancomycin >> 4/11 4/10 Primaxin >> 4/11  Dose adjustments this admission:   Microbiology results: 4/10 BCx: 4/10 UCx:   4/10 Sputum: 4/10 MRSA PCR: negative  Thank you for allowing pharmacy to be a part of this patient's care.  Tonette Bihari, PharmD candidate 12/09/2015 11:41 AM

## 2015-12-09 NOTE — Consult Note (Signed)
   [facility] CM Inpatient Consult   12/09/2015  [name] 1960-10-23 [id number] Referral received for [facility] Care management services for 2 admissions and 3 ED visits. Currently admitted with COPD Exacerbation.  Patient assessed for potential [place] Management services. Patient is currently not oriented and refusing nursing care.  Patient is eligible for [place] [facility] Care Management services under patient's Medicare  plan.  Currently the patient's discharge plan is not confirmed.  Spoke with inpatient RNCM, Vinnie Level to update her that the referral has been received and not appropriate at this current time.  Will continue to follow for appropriate care needs.  Please place a [facility] Care Management consult or for questions contact:   Natividad Brood, RN BSN [facility] Liaison  [phone] business mobile phone Toll free office [phone]

## 2015-12-10 ENCOUNTER — Inpatient Hospital Stay (HOSPITAL_COMMUNITY): Payer: Medicare Other

## 2015-12-10 LAB — CBC
HEMATOCRIT: 34.3 % — AB (ref 36.0–46.0)
HEMOGLOBIN: 11.1 g/dL — AB (ref 12.0–15.0)
MCH: 32.7 pg (ref 26.0–34.0)
MCHC: 32.4 g/dL (ref 30.0–36.0)
MCV: 101.2 fL — AB (ref 78.0–100.0)
PLATELETS: 146 10*3/uL — AB (ref 150–400)
RBC: 3.39 MIL/uL — AB (ref 3.87–5.11)
RDW: 14.2 % (ref 11.5–15.5)
WBC: 16.3 10*3/uL — AB (ref 4.0–10.5)

## 2015-12-10 LAB — GLUCOSE, CAPILLARY
GLUCOSE-CAPILLARY: 139 mg/dL — AB (ref 65–99)
Glucose-Capillary: 179 mg/dL — ABNORMAL HIGH (ref 65–99)
Glucose-Capillary: 177 mg/dL — ABNORMAL HIGH (ref 65–99)
Glucose-Capillary: 126 mg/dL — ABNORMAL HIGH (ref 65–99)

## 2015-12-10 LAB — BASIC METABOLIC PANEL
ANION GAP: 10 (ref 5–15)
BUN: 100 mg/dL — ABNORMAL HIGH (ref 6–20)
CHLORIDE: 117 mmol/L — AB (ref 101–111)
CO2: 17 mmol/L — ABNORMAL LOW (ref 22–32)
Calcium: 10 mg/dL (ref 8.9–10.3)
Creatinine, Ser: 4.11 mg/dL — ABNORMAL HIGH (ref 0.44–1.00)
GFR calc Af Amer: 13 mL/min — ABNORMAL LOW (ref >60)
GFR, EST NON AFRICAN AMERICAN: 11 mL/min — AB (ref >60)
GLUCOSE: 162 mg/dL — AB (ref 65–99)
POTASSIUM: 5.7 mmol/L — AB (ref 3.5–5.1)
Sodium: 144 mmol/L (ref 135–145)

## 2015-12-10 LAB — LEGIONELLA PNEUMOPHILA SEROGP 1 UR AG: L. PNEUMOPHILA SEROGP 1 UR AG: NEGATIVE

## 2015-12-10 LAB — URINE CULTURE: Culture: 1000 — AB

## 2015-12-10 MED ORDER — ALBUTEROL SULFATE (2.5 MG/3ML) 0.083% IN NEBU
2.5000 mg | INHALATION_SOLUTION | Freq: Four times a day (QID) | RESPIRATORY_TRACT | Status: DC
  Administered 2015-12-11 – 2015-12-17 (×23): 2.5 mg via RESPIRATORY_TRACT
  Filled 2015-12-10 (×26): qty 3

## 2015-12-10 MED ORDER — GABAPENTIN 400 MG PO CAPS
400.0000 mg | ORAL_CAPSULE | Freq: Two times a day (BID) | ORAL | Status: DC
  Administered 2015-12-10 – 2015-12-11 (×2): 400 mg via ORAL
  Filled 2015-12-10 (×2): qty 1

## 2015-12-10 MED ORDER — SODIUM POLYSTYRENE SULFONATE 15 GM/60ML PO SUSP
15.0000 g | Freq: Once | ORAL | Status: AC
  Administered 2015-12-10: 15 g via ORAL
  Filled 2015-12-10: qty 60

## 2015-12-10 NOTE — Progress Notes (Signed)
[facility] PROGRESS NOTE  [name] [id number] DOB: 1961/05/25 DOA: 12/08/2015 PCP: Benito Mccreedy, MD  Assessment/Plan:  Acute encephalopathy -On my evaluation [name] encephalopathic, confused, disoriented, unable to follow commands for me. -Unclear etiology,  include withdrawal, infection, medication induced, psychiatric disorder -During this hospitalization she has received IV Ativan -Will check a CT scan of brain  COPD exacerbation -Suspect precipitated by infectious process as chest x-ray showed patchy bilateral lower lobe pneumonia -Seems improved from respiratory standpoint  Community acquire pneumonia.  -Patient presented with complaints of shortness of breath and cough with chest x-ray showing findings consistent with pneumonia. -Repeat chest x-ray on 12/09/2015 reported by radiology to have interim clearing of infiltrates. 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 -when more alert, will discussed weight loss and low calorie diet    Code Status: Full Family Communication: no family at bedside  Disposition Plan: back to group home when medically stable   Consultants:  None   Procedures:  See below for x-ray reports   Antibiotics:  levaquin 4/11  HPI/Subjective: She remains agitated, having difficulty following commands.  Objective: Filed Vitals:   12/10/15 1210 12/10/15 1304  BP:  168/92  Pulse: 98 110  Temp:  97.6 F (36.4 C)  Resp: 22 22     Intake/Output Summary (Last 24 hours) at 12/10/15 1437 Last data filed at 12/10/15 0700  Gross per 24 hour  Intake 1961.67 ml  Output      0 ml  Net 1961.67 ml   Filed Weights   12/08/15 1546 12/08/15 1751  Weight: 86.183 kg (190 lb) 86.183 kg (190 lb)    Exam:   General:  She is agitated, restless, confused, disoriented  Cardiovascular: S1 and S2, no rubs or gallops  Respiratory: diffuse wheezing and rhonchi, no crackles  Abdomen: soft, NT, positive BS  Musculoskeletal: trace edema bilaterally  Data Reviewed: Basic Metabolic Panel:  Recent Labs Lab 12/08/15 1205 12/08/15 1606 12/09/15 0516 12/10/15 0500  NA 136  --  139 144  K 5.1  --  5.2* 5.7*  CL 105  --  110 117*  CO2 20*  --  20* 17*  GLUCOSE 127*  --  191* 162*  BUN 81*  --  92* 100*  CREATININE 4.45*  --  4.30* 4.11*  CALCIUM 9.5  --  9.7 10.0  MG  --  1.9  --   --    Liver Function Tests:  Recent Labs Lab 12/08/15 1530 12/09/15 0516  AST 85* 66*  ALT 52 48  ALKPHOS 112 94  BILITOT 0.8 0.4  PROT 7.1 6.6  ALBUMIN 2.3* 2.0*   CBC:  Recent Labs Lab 12/08/15 1205 12/09/15 0516 12/10/15 0500  WBC 23.8* 12.7* 16.3*  HGB 11.4* 10.9* 11.1*  HCT 34.5* 32.9* 34.3*  MCV 101.2* 98.5 101.2*  PLT 183 136* 146*   Cardiac Enzymes:  Recent Labs Lab 12/08/15 1205 12/08/15 1530 12/08/15 2125 12/09/15 0516  TROPONINI 0.08* 0.03 0.03 0.03   BNP (last 3 results)  Recent Labs  12/08/15 1205  BNP 556.7*   CBG:  Recent Labs Lab 12/09/15 1213 12/09/15 1655 12/09/15 2136 12/10/15 0741 12/10/15 1227  GLUCAP 149* 152* 152* 139* 179*    Recent Results (from the past 240 hour(s))  Culture, blood (routine x 2) Call MD if unable to obtain prior to antibiotics being given     Status: None (Preliminary result)   Collection Time: 12/08/15  3:21 PM  Result Value Ref Range Status   Specimen Description BLOOD LEFT ANTECUBITAL  Final   Special Requests BOTTLES DRAWN AEROBIC AND ANAEROBIC  5CC  Final   Culture   Final    NO GROWTH 2 DAYS Performed at [facility]    Report Status PENDING  Incomplete  Culture, blood (routine x 2) Call MD if unable to obtain prior to antibiotics being given     Status: None (Preliminary result)   Collection Time: 12/08/15  4:06 PM  Result Value Ref Range Status   Specimen Description BLOOD LEFT FOREARM  Final   Special Requests IN PEDIATRIC BOTTLE [place]  Final   Culture   Final    NO GROWTH 2 DAYS Performed at [facility]    Report Status PENDING  Incomplete  MRSA PCR Screening     Status: None   Collection Time: 12/08/15 10:52 PM  Result Value Ref Range Status   MRSA by PCR NEGATIVE NEGATIVE Final    Comment:        The GeneXpert MRSA Assay (FDA approved for NASAL specimens only), is one component of a comprehensive MRSA colonization surveillance program. It is not intended to diagnose MRSA infection nor to guide or monitor treatment for MRSA infections.   Urine culture     Status: Abnormal   Collection Time: 12/08/15 11:26 PM  Result Value Ref Range Status   Specimen Description URINE, CLEAN CATCH  Final   Special Requests NONE  Final   Culture (A)  Final    1,000 COLONIES/mL INSIGNIFICANT GROWTH Performed at [facility]    Report Status 12/10/2015 FINAL  Final     Studies: Dg Chest Port 1 View  12/09/2015  CLINICAL DATA:  Sepsis. EXAM: PORTABLE CHEST 1 VIEW COMPARISON:  12/08/2015. FINDINGS: Mediastinum hilar structures normal. Stable cardiomegaly. Interim partial clearing of bibasilar atelectasis and/or infiltrates. No pleural effusion or pneumothorax. IMPRESSION: 1 interim partial clearing of bibasilar atelectasis and/or infiltrates. 2. Cardiomegaly. Electronically Signed   By: Marcello Moores  Register   On: 12/09/2015 07:13    Scheduled Meds: . albuterol  2.5 mg Nebulization Q4H  . amantadine  100 mg Oral BID  . ARIPiprazole  15 mg Oral BH-qamhs  . atorvastatin  40 mg Oral QHS  . benztropine  0.5 mg  Oral BID  . budesonide (PULMICORT) nebulizer solution  0.25 mg Nebulization BID  . calcitRIOL  0.25 mcg Oral Once per day on Mon Wed Fri  . carvedilol  3.125 mg Oral q morning - 10a  . divalproex  500 mg Oral BH-q8a3p  . divalproex  750 mg Oral QHS  . enoxaparin (LOVENOX) injection  30 mg Subcutaneous Q24H  . feeding supplement (ENSURE ENLIVE)  237 mL Oral BID BM  . gabapentin  400 mg Oral BID  . guaiFENesin  600 mg Oral BID  . haloperidol  5 mg Oral BID  . insulin aspart  0-9 Units Subcutaneous TID WC  . levofloxacin (LEVAQUIN) IV  500 mg Intravenous Q48H  . levothyroxine  75 mcg Oral QAC breakfast  .  methylPREDNISolone (SOLU-MEDROL) injection  60 mg Intravenous 3 times per day  . sodium chloride  500 mL Intravenous Once  . sodium chloride flush  3 mL Intravenous Q12H  . tiotropium  18 mcg Inhalation Daily   Continuous Infusions: . sodium chloride 100 mL/hr at 12/10/15 1022    Active Problems:   HTN (hypertension)   COPD (chronic obstructive pulmonary disease) (HCC)   Chronic kidney disease   Bipolar disorder, current episode manic severe with psychotic features ([place])   Prediabetes   COPD with acute exacerbation ([place])   HCAP (healthcare-associated pneumonia)    Time spent: 54 minutes    Kelvin Cellar  [facility] Pager [phone]. If 7PM-7AM, please contact night-coverage at [email or web address], password [facility] 12/10/2015, 2:37 PM  LOS: 2 days

## 2015-12-10 NOTE — Progress Notes (Signed)
PT Cancellation Note  Patient Details Name: ADALENE SUMPTION MRN: [id number] DOB: 1961-02-22   Cancelled Treatment:    Reason Eval/Treat Not Completed: Unable to awaken pt for participation with therapy despite verbal and tactile stimuli. will check back another day.    [name], MPT Pager: [phone]

## 2015-12-10 NOTE — Consult Note (Signed)
WOC wound consult note Reason for Consult:intertriginous dermatitis (ITD) in the sub pannicular area Wound type:moisture Pressure Ulcer POA:No Wound [id number] from skin to skin friction in the presence of moisture that is accumulating in this area Drainage (amount, consistency, odor) None Periwound:intact Dressing procedure/placement/frequency: Orders are provided for the bedside Nursing staff using the house antimicrobial textile, InterDry Ag+. [place] nursing team will not follow, but will remain available to this patient, the nursing and medical teams.  Please re-consult if needed. Thanks, Maudie Flakes, MSN, RN, [place], Arther Abbott  Pager# [phone]

## 2015-12-11 ENCOUNTER — Inpatient Hospital Stay (HOSPITAL_COMMUNITY): Payer: Medicare Other

## 2015-12-11 LAB — CBC
HCT: 34.9 % — ABNORMAL LOW (ref 36.0–46.0)
Hemoglobin: 10.9 g/dL — ABNORMAL LOW (ref 12.0–15.0)
MCH: 32.9 pg (ref 26.0–34.0)
MCHC: 31.2 g/dL (ref 30.0–36.0)
MCV: 105.4 fL — AB (ref 78.0–100.0)
Platelets: 127 10*3/uL — ABNORMAL LOW (ref 150–400)
RBC: 3.31 MIL/uL — AB (ref 3.87–5.11)
RDW: 14.4 % (ref 11.5–15.5)
WBC: 10.2 10*3/uL (ref 4.0–10.5)

## 2015-12-11 LAB — BASIC METABOLIC PANEL
Anion gap: 10 (ref 5–15)
BUN: 116 mg/dL — ABNORMAL HIGH (ref 6–20)
CALCIUM: 9.7 mg/dL (ref 8.9–10.3)
CHLORIDE: 118 mmol/L — AB (ref 101–111)
CO2: 19 mmol/L — AB (ref 22–32)
CREATININE: 3.92 mg/dL — AB (ref 0.44–1.00)
GFR calc non Af Amer: 12 mL/min — ABNORMAL LOW (ref >60)
GFR, EST AFRICAN AMERICAN: 14 mL/min — AB (ref >60)
GLUCOSE: 193 mg/dL — AB (ref 65–99)
Potassium: 5.3 mmol/L — ABNORMAL HIGH (ref 3.5–5.1)
Sodium: 147 mmol/L — ABNORMAL HIGH (ref 135–145)

## 2015-12-11 LAB — GLUCOSE, CAPILLARY
Glucose-Capillary: 141 mg/dL — ABNORMAL HIGH (ref 65–99)
Glucose-Capillary: 135 mg/dL — ABNORMAL HIGH (ref 65–99)
Glucose-Capillary: 121 mg/dL — ABNORMAL HIGH (ref 65–99)
Glucose-Capillary: 129 mg/dL — ABNORMAL HIGH (ref 65–99)

## 2015-12-11 NOTE — Consult Note (Signed)
   [facility] CM Inpatient Consult   12/11/2015  DAWNYA FLENNER 1960/11/19 [id number]   [facility] Care Management follow up on referral. Patient remains confused. Not appropriate for [facility] Care Management at this time. Please place [facility] Care Management consult if disposition plans are for home. Will make inpatient RNCM aware.   Marthenia Rolling, MSN-Ed, RN,BSN [facility] Liaison [phone]

## 2015-12-11 NOTE — Progress Notes (Signed)
PT Cancellation Note  Patient Details Name: [name] MRN: [id number] DOB: 02-18-1961   Cancelled Treatment:    Reason Eval/Treat Not Completed: Medical issues which prohibited therapy. Will hold PT for now and check back another time. Thanks.    [name], MPT Pager: [phone]

## 2015-12-11 NOTE — Care Management Important Message (Signed)
Important Message  Patient Details  Name: [name] MRN: [id number] Date of Birth: 16-Feb-1961   Medicare Important Message Given:  Yes    Camillo Flaming 12/11/2015, 9:10 AMImportant Message  Patient Details  Name: [name] MRN: [id number] Date of Birth: 1960/10/01   Medicare Important Message Given:  Yes    Camillo Flaming 12/11/2015, 9:10 AM

## 2015-12-11 NOTE — Progress Notes (Signed)
Nutrition Follow-up  DOCUMENTATION CODES:   Obesity unspecified  INTERVENTION:  - RD will continue to monitor POC, ability for diet to be advanced - If unable to advance diet and if nutrition support warranted, recommend NGT with Jevity 1.2 @ 55 mL/hr with 30 mL Prostat once/day to provide 1684 kcal, 88 grams of protein, and 1065 mL free water.  NUTRITION DIAGNOSIS:   Inadequate oral intake related to lethargy/confusion, inability to eat as evidenced by NPO status. -revised, ongoing  GOAL:   Patient will meet greater than or equal to 90% of their needs -unmet  MONITOR:   Diet advancement, Weight trends, Labs, I & O's  ASSESSMENT:   55 year old female with a history of COPD, hypertension, chronic kidney disease stage III, bipolar disorder, who presents to the ER because of malaise and shortness of breath and cough for the last 2 weeks. She has a history of COPD and smokes. Her MDI inhaler have not provided her any relief from her shortness of breath. She denies any fever chills, rigors. Patient has significant difficulty communicating because of her bipolar disorder. However she is awake alert but disoriented. He is requesting her medications repeatedly. Therefore the patient's history is obtained from the chart. Patient is a resident of [facility].  4/13 No new weight since previous assessment. Pt previously on Carb Modified diet and chart review indicates pt took a few bites of breakfast and lunch yesterday, 0% of dinner. Pt's diet downgraded to NPO today at 1000 and SLP attempted to see pt approximately 1 hour ago. Per note, SLP unable to evaluate pt because: pt not able to maintain attention to task, does not answer questions nor follow directions, fidgeting and trying to pull off safety mitt, when HOB lowered, pt coughing on secretions.  Pt unable to meet needs and TF recommendations outlined above should NPO status be prolonged. Continue to  be unable to complete physical assessment as pt agitated when RD entered the room, continues with bilateral mitten restraints. Pt mumbling to herself incoherently. MD note 4/12 @ 1437 states: when more alert, will discuss weight loss and low kcal diet.    Medications reviewed. IVF: NS @ 125 mL/hr. Labs reviewed; CBGs: 126-179 mg/dL, Na: 147 mmol/L, K: 5.3 mmol/L, Cl: 118 mmol/L, BUN/creatinine elevated, GFR: 12.    4/11 - No intakes documented since admission.  - Spoke with RN who reports that pt was given Ativan early AM and has been asleep since that time. - RN unsure of pt's baseline.  - Visualized pt sleeping and no visible muscle or fat wasting but did not perform physical assessment as pt has bilateral mitten restraints and RN reports pt with hx of bipolar disorder who was not fully cooperative prior to Ativan dose.  - No visitors at bedside to provide information from PTA.  - Per chart review, pt has lost 28 lbs (13% body weight) in the past 4 months which is significant for time frame.  - Unable to confirm malnutrition at this time but will document further findings at follow-up.  Diet Order:  Diet NPO time specified  Skin:  Reviewed, no issues  Last BM:  4/13  Height:   Ht Readings from Last 1 Encounters:  12/08/15 5\' 5"  (1.651 m)    Weight:   Wt Readings from Last 1 Encounters:  12/08/15 190 lb (86.183 kg)    Ideal Body Weight:  56.82 kg (kg)  BMI:  Body mass index is 31.62 kg/(m^2).  Estimated Nutritional Needs:   Kcal:  1550-1720 (18-20 kcal/kg)  Protein:  80-90 grams  Fluid:  2 L/day  EDUCATION NEEDS:   No education needs identified at this time     Jarome Matin, RD, LDN Inpatient Clinical Dietitian Pager # [phone] After hours/weekend pager # [phone]

## 2015-12-11 NOTE — Evaluation (Signed)
SLP Cancellation Note  Patient Details Name: [name] MRN: [id number] DOB: 1961/03/27   Cancelled treatment:       Reason Eval/Treat Not Completed: Fatigue/lethargy limiting ability to participate;Other (comment) (pt not able to maintain attention to task, does not answer questions nor follow directions, fidgeting and trying to pull off safety mitt,  when HOB lowered, pt coughing on secretions - left HOB raised, asked RN to page if pt becomes appropriate for eval; she reports pt was coughing with all intake and she has concerns for aspiration)   Luanna Salk, [facility] SLP [phone]

## 2015-12-11 NOTE — Progress Notes (Signed)
[facility] PROGRESS NOTE  [name] [id number] DOB: Apr 15, 1961 DOA: 12/08/2015 PCP: Benito Mccreedy, MD  Assessment/Plan:  Acute encephalopathy -On my evaluation [name] encephalopathic, confused, disoriented, unable to follow commands for me. -Unclear etiology,  include withdrawal, infection, medication induced, psychiatric disorder -During this hospitalization she has received IV Ativan -CT scan of brain that revealed intracranial mass, hemorrhage, lesions. Chest x-ray showing improvement to atelectasis versus infiltrates involving bases. -Attempted to get a hold of family members and a group home to get an idea of her baseline status.  COPD exacerbation -Suspect precipitated by infectious process as chest x-ray showed patchy bilateral lower lobe pneumonia -Will discontinue IV steroids as she has shown improvement from a respiratory standpoint  Community acquire pneumonia.  -Patient presented with complaints of shortness of breath and cough with chest x-ray showing findings consistent with pneumonia. 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 -when more alert, will discussed weight loss and low calorie diet    Code Status: Full Family Communication: no family at bedside  Disposition Plan: back to group home when medically  stable   Consultants:  None   Procedures:  See below for x-ray reports   Antibiotics:  levaquin 4/11  HPI/Subjective: She remains agitated, having difficulty following commands.  Objective: Filed Vitals:   12/11/15 0900 12/11/15 1511  BP: 156/72 129/85  Pulse: 110 100  Temp:  98.1 F (36.7 C)  Resp:  18    Intake/Output Summary (Last 24 hours) at 12/11/15 1832 Last data filed at 12/11/15 0700  Gross per 24 hour  Intake 2502.08 ml  Output      0 ml  Net 2502.08 ml   Filed Weights   12/08/15 1546 12/08/15 1751  Weight: 86.183 kg (190 lb) 86.183 kg (190 lb)    Exam:   General:  She is agitated, restless, confused, disoriented  Cardiovascular: S1 and S2, no rubs or gallops  Respiratory: diffuse wheezing and rhonchi, no crackles  Abdomen: soft, NT, positive BS  Musculoskeletal: trace edema bilaterally  Data Reviewed: Basic Metabolic Panel:  Recent Labs Lab 12/08/15 1205 12/08/15 1606 12/09/15 0516 12/10/15 0500 12/11/15 0507  NA 136  --  139 144 147*  K 5.1  --  5.2* 5.7* 5.3*  CL 105  --  110 117* 118*  CO2 20*  --  20* 17* 19*  GLUCOSE 127*  --  191* 162* 193*  BUN 81*  --  92* 100* 116*  CREATININE 4.45*  --  4.30* 4.11* 3.92*  CALCIUM 9.5  --  9.7 10.0 9.7  MG  --  1.9  --   --   --    Liver Function Tests:  Recent Labs Lab 12/08/15 1530 12/09/15 0516  AST 85* 66*  ALT 52 48  ALKPHOS 112 94  BILITOT 0.8 0.4  PROT 7.1 6.6  ALBUMIN 2.3* 2.0*   CBC:  Recent Labs Lab 12/08/15 1205  12/09/15 0516 12/10/15 0500 12/11/15 0507  WBC 23.8* 12.7* 16.3* 10.2  HGB 11.4* 10.9* 11.1* 10.9*  HCT 34.5* 32.9* 34.3* 34.9*  MCV 101.2* 98.5 101.2* 105.4*  PLT 183 136* 146* 127*   Cardiac Enzymes:  Recent Labs Lab 12/08/15 1205 12/08/15 1530 12/08/15 2125 12/09/15 0516  TROPONINI 0.08* 0.03 0.03 0.03   BNP (last 3 results)  Recent Labs  12/08/15 1205  BNP 556.7*   CBG:  Recent Labs Lab 12/10/15 1723 12/10/15 2110  12/11/15 0831 12/11/15 1211 12/11/15 1718  GLUCAP 177* 126* 141* 135* 121*    Recent Results (from the past 240 hour(s))  Culture, blood (routine x 2) Call MD if unable to obtain prior to antibiotics being given     Status: None (Preliminary result)   Collection Time: 12/08/15  3:21 PM  Result Value Ref Range Status   Specimen Description BLOOD LEFT ANTECUBITAL  Final   Special Requests BOTTLES DRAWN AEROBIC AND ANAEROBIC 5CC  Final   Culture   Final    NO GROWTH 3 DAYS Performed at [facility]    Report Status PENDING  Incomplete  Culture, blood (routine x 2) Call MD if unable to obtain prior to antibiotics being given     Status: None (Preliminary result)   Collection Time: 12/08/15  4:06 PM  Result Value Ref Range Status   Specimen Description BLOOD LEFT FOREARM  Final   Special Requests IN PEDIATRIC BOTTLE 2CC  Final   Culture   Final    NO GROWTH 3 DAYS Performed at [facility]    Report Status PENDING  Incomplete  MRSA PCR Screening     Status: None   Collection Time: 12/08/15 10:52 PM  Result Value Ref Range Status   MRSA by PCR NEGATIVE NEGATIVE Final    Comment:        The GeneXpert MRSA Assay (FDA approved for NASAL specimens only), is one component of a comprehensive MRSA colonization surveillance program. It is not intended to diagnose MRSA infection nor to guide or monitor treatment for MRSA infections.   Urine culture     Status: Abnormal   Collection Time: 12/08/15 11:26 PM  Result Value Ref Range Status   Specimen Description URINE, CLEAN CATCH  Final   Special Requests NONE  Final   Culture (A)  Final    1,000 COLONIES/mL INSIGNIFICANT GROWTH Performed at [facility]    Report Status 12/10/2015 FINAL  Final     Studies: Dg Chest 2 View  12/11/2015  CLINICAL DATA:  Pneumonia. EXAM: CHEST  2 VIEW COMPARISON:  12/08/2015 and 12/09/2015 FINDINGS: Patient slightly rotated to the left. Lungs are hypoinflated with continued  interval improvement in subtle opacification in the right base and mild persistent opacification of the left base. Findings likely represent resolving atelectasis or infection. No definite effusion. Cardiomediastinal silhouette and remainder of the exam is unchanged. IMPRESSION: Improving bibasilar opacification likely resolving infection or atelectasis. Electronically Signed   By: Marin Olp M.D.   On: 12/11/2015 14:47   Ct Head Wo Contrast  12/10/2015  CLINICAL DATA:  Altered mental status/confusion EXAM: CT HEAD WITHOUT CONTRAST TECHNIQUE: Contiguous axial images were obtained from the base of the skull through the vertex without intravenous contrast. COMPARISON:  January 29, 2013 FINDINGS: There is mild diffuse atrophy for age, stable. There is no intracranial mass, hemorrhage, extra-axial fluid collection, or midline shift. The gray-white compartments appear normal. No acute infarct evident. The bony calvarium appears intact. There  is opacification of most of the mastoid air cells bilaterally, more severe on the right than on the left. There is extensive mucosal thickening in the left maxillary antrum. There is opacification of multiple ethmoid air cells bilaterally. There is also diffuse opacity in the frontal sinuses bilaterally. No intraorbital lesions evident. IMPRESSION: Mild diffuse atrophy for age. No intracranial mass, hemorrhage, or focal gray - white compartment lesion. Extensive mastoid air cell disease bilaterally as well as multifocal paranasal sinus disease. Electronically Signed   By: Lowella Grip III M.D.   On: 12/10/2015 18:06    Scheduled Meds: . albuterol  2.5 mg Nebulization Q6H  . amantadine  100 mg Oral BID  . ARIPiprazole  15 mg Oral BH-qamhs  . atorvastatin  40 mg Oral QHS  . benztropine  0.5 mg Oral BID  . budesonide (PULMICORT) nebulizer solution  0.25 mg Nebulization BID  . carvedilol  3.125 mg Oral q morning - 10a  . divalproex  500 mg Oral [id number]  . divalproex   750 mg Oral QHS  . enoxaparin (LOVENOX) injection  30 mg Subcutaneous Q24H  . feeding supplement (ENSURE ENLIVE)  237 mL Oral BID BM  . guaiFENesin  600 mg Oral BID  . haloperidol  5 mg Oral BID  . insulin aspart  0-9 Units Subcutaneous TID WC  . levofloxacin (LEVAQUIN) IV  500 mg Intravenous Q48H  . levothyroxine  75 mcg Oral QAC breakfast  . sodium chloride  500 mL Intravenous Once  . sodium chloride flush  3 mL Intravenous Q12H  . tiotropium  18 mcg Inhalation Daily   Continuous Infusions: . sodium chloride 125 mL/hr at 12/11/15 [id number]    Active Problems:   HTN (hypertension)   COPD (chronic obstructive pulmonary disease) (HCC)   Chronic kidney disease   Bipolar disorder, current episode manic severe with psychotic features ([place])   Prediabetes   COPD with acute exacerbation ([place])   HCAP (healthcare-associated pneumonia)    Time spent: 25 minutes    Kelvin Cellar  [facility] Pager [phone]. If 7PM-7AM, please contact night-coverage at [email or web address], password [facility] 12/11/2015, 6:32 PM  LOS: 3 days

## 2015-12-12 ENCOUNTER — Inpatient Hospital Stay (HOSPITAL_COMMUNITY): Payer: Medicare Other

## 2015-12-12 DIAGNOSIS — R410 Disorientation, unspecified: Secondary | ICD-10-CM

## 2015-12-12 LAB — CBC
HEMATOCRIT: 36.7 % (ref 36.0–46.0)
HEMOGLOBIN: 11.6 g/dL — AB (ref 12.0–15.0)
MCH: 33 pg (ref 26.0–34.0)
MCHC: 31.6 g/dL (ref 30.0–36.0)
MCV: 104.6 fL — ABNORMAL HIGH (ref 78.0–100.0)
Platelets: 160 10*3/uL (ref 150–400)
RBC: 3.51 MIL/uL — AB (ref 3.87–5.11)
RDW: 14.5 % (ref 11.5–15.5)
WBC: 15.4 10*3/uL — ABNORMAL HIGH (ref 4.0–10.5)

## 2015-12-12 LAB — DIFFERENTIAL
BASOS ABS: 0 10*3/uL (ref 0.0–0.1)
Basophils Relative: 0 %
EOS PCT: 0 %
Eosinophils Absolute: 0 10*3/uL (ref 0.0–0.7)
LYMPHS ABS: 2.2 10*3/uL (ref 0.7–4.0)
Lymphocytes Relative: 14 %
Monocytes Absolute: 0.6 10*3/uL (ref 0.1–1.0)
Monocytes Relative: 4 %
NEUTROS ABS: 12.6 10*3/uL — AB (ref 1.7–7.7)
Neutrophils Relative %: 82 %

## 2015-12-12 LAB — URINALYSIS, ROUTINE W REFLEX MICROSCOPIC
Bilirubin Urine: NEGATIVE
Glucose, UA: NEGATIVE mg/dL
Ketones, ur: NEGATIVE mg/dL
Leukocytes, UA: NEGATIVE
NITRITE: NEGATIVE
PH: 5.5 (ref 5.0–8.0)
Protein, ur: 100 mg/dL — AB
SPECIFIC GRAVITY, URINE: 1.013 (ref 1.005–1.030)

## 2015-12-12 LAB — BASIC METABOLIC PANEL
ANION GAP: 10 (ref 5–15)
BUN: 127 mg/dL — ABNORMAL HIGH (ref 6–20)
CALCIUM: 10.4 mg/dL — AB (ref 8.9–10.3)
CO2: 17 mmol/L — ABNORMAL LOW (ref 22–32)
Chloride: 127 mmol/L — ABNORMAL HIGH (ref 101–111)
Creatinine, Ser: 4.15 mg/dL — ABNORMAL HIGH (ref 0.44–1.00)
GFR, EST AFRICAN AMERICAN: 13 mL/min — AB (ref >60)
GFR, EST NON AFRICAN AMERICAN: 11 mL/min — AB (ref >60)
Glucose, Bld: 139 mg/dL — ABNORMAL HIGH (ref 65–99)
POTASSIUM: 5.5 mmol/L — AB (ref 3.5–5.1)
Sodium: 154 mmol/L — ABNORMAL HIGH (ref 135–145)

## 2015-12-12 LAB — URINE MICROSCOPIC-ADD ON

## 2015-12-12 LAB — CK: Total CK: 149 U/L (ref 38–234)

## 2015-12-12 LAB — OSMOLALITY, URINE: Osmolality, Ur: 396 mOsm/kg (ref 300–900)

## 2015-12-12 LAB — IRON AND TIBC
IRON: 73 ug/dL (ref 28–170)
Saturation Ratios: 55 % — ABNORMAL HIGH (ref 10.4–31.8)
TIBC: 133 ug/dL — ABNORMAL LOW (ref 250–450)
UIBC: 60 ug/dL

## 2015-12-12 LAB — SODIUM, URINE, RANDOM: SODIUM UR: 46 mmol/L

## 2015-12-12 LAB — CREATININE, URINE, RANDOM: Creatinine, Urine: 85.74 mg/dL

## 2015-12-12 LAB — GLUCOSE, CAPILLARY
GLUCOSE-CAPILLARY: 117 mg/dL — AB (ref 65–99)
GLUCOSE-CAPILLARY: 93 mg/dL (ref 65–99)
GLUCOSE-CAPILLARY: 78 mg/dL (ref 65–99)
Glucose-Capillary: 114 mg/dL — ABNORMAL HIGH (ref 65–99)

## 2015-12-12 LAB — URIC ACID: URIC ACID, SERUM: 15.6 mg/dL — AB (ref 2.3–6.6)

## 2015-12-12 LAB — OSMOLALITY: Osmolality: 372 mOsm/kg (ref 275–295)

## 2015-12-12 MED ORDER — SODIUM BICARBONATE 8.4 % IV SOLN
INTRAVENOUS | Status: DC
Start: 2015-12-12 — End: 2015-12-16
  Administered 2015-12-12 – 2015-12-16 (×5): via INTRAVENOUS
  Filled 2015-12-12 (×5): qty 9.7

## 2015-12-12 MED ORDER — HALOPERIDOL LACTATE 5 MG/ML IJ SOLN
2.0000 mg | Freq: Four times a day (QID) | INTRAMUSCULAR | Status: DC | PRN
  Administered 2015-12-12 (×2): 2 mg via INTRAVENOUS
  Filled 2015-12-12 (×2): qty 1

## 2015-12-12 MED ORDER — LEVOTHYROXINE SODIUM 100 MCG IV SOLR
37.5000 ug | Freq: Every day | INTRAVENOUS | Status: DC
  Administered 2015-12-12 – 2015-12-18 (×7): 37.5 ug via INTRAVENOUS
  Filled 2015-12-12 (×7): qty 5

## 2015-12-12 MED ORDER — SODIUM CHLORIDE 0.9 % IV BOLUS (SEPSIS)
1000.0000 mL | Freq: Once | INTRAVENOUS | Status: AC
  Administered 2015-12-12: 1000 mL via INTRAVENOUS

## 2015-12-12 MED ORDER — DIPHENHYDRAMINE HCL 25 MG PO CAPS: 50.0000 mg | ORAL_CAPSULE | Freq: Four times a day (QID) | ORAL | Status: DC | PRN

## 2015-12-12 MED ORDER — LORAZEPAM 2 MG/ML IJ SOLN
1.0000 mg | Freq: Four times a day (QID) | INTRAMUSCULAR | Status: DC
  Administered 2015-12-12 – 2015-12-16 (×12): 1 mg via INTRAVENOUS
  Filled 2015-12-12 (×10): qty 1

## 2015-12-12 MED ORDER — HALOPERIDOL LACTATE 5 MG/ML IJ SOLN
5.0000 mg | Freq: Four times a day (QID) | INTRAMUSCULAR | Status: DC | PRN
  Administered 2015-12-16 – 2015-12-17 (×2): 5 mg via INTRAVENOUS
  Filled 2015-12-12 (×2): qty 1

## 2015-12-12 NOTE — NC FL2 (Signed)
[place] MEDICAID FL2 LEVEL OF CARE SCREENING TOOL     IDENTIFICATION  Patient Name: [name] Birthdate: [birth year] Sex: female Admission Date (Current Location): 12/08/2015  [facility] and [place] Number:  Herbalist and Address:  [facility],  [place] [address], [place]      Provider Number: [id number]  Attending Physician Name and Address:  Kelvin Cellar, MD  Relative Name and Phone Number:       Current Level of Care: Hospital Recommended Level of Care: [place] Prior Approval Number:    Date Approved/Denied:   PASRR Number:    Discharge Plan: SNF    Current Diagnoses: Patient Active Problem List   Diagnosis Date Noted  . COPD with acute exacerbation ([place]) 12/08/2015  . HCAP (healthcare-associated pneumonia) 12/08/2015  . Acute renal failure ([place])   . Skin lesions 07/04/2015  . Prediabetes 07/03/2015  . Bipolar disorder, current episode manic severe with psychotic features ([place]) 06/30/2015  . History of breast cancer in female 04/10/2015  . Irreducible ventral hernia 04/12/2013  . HTN (hypertension)   . COPD (chronic obstructive pulmonary disease) ([place])   . Chronic kidney disease   . Hyperlipidemia   . Hypothyroidism   . Bipolar affective ([place])   . Anemia in chronic kidney disease(285.21) 07/10/2011  . Thrombocytopenia ([place]) 07/10/2011  . BIPOLAR AFFECTIVE DISORDER 08/15/2006  . SMOKER 08/15/2006  . COPD 08/15/2006  . BOILS, RECURRENT 08/15/2006  . ACNE ROSACEA 08/15/2006  . ACNE NEC 08/15/2006  . EDEMA 08/15/2006  . INCONTINENCE, URGE 08/15/2006    Orientation RESPIRATION BLADDER Height & Weight     Self  O2 (3L) Indwelling catheter Weight: 190 lb (86.183 kg) Height:  5\' 5"  (165.1 cm)  BEHAVIORAL SYMPTOMS/MOOD NEUROLOGICAL BOWEL NUTRITION STATUS   (altered mental status/has been somewhat combative)  (n/a) Incontinent  (NPO time specified. See d/c summary for updates)  AMBULATORY STATUS  COMMUNICATION OF NEEDS Skin   Total Care  (speech difficult to understand) Other (Comment) (Generalized bruising. Moisture associated skin damage to groin and abdomen)                       Personal Care Assistance Level of Assistance  Bathing, Feeding, Dressing Bathing Assistance: Maximum assistance Feeding assistance: Maximum assistance Dressing Assistance: Maximum assistance     Functional Limitations Info  Sight, Hearing, Speech Sight Info: Impaired Hearing Info: Adequate Speech Info: Impaired    SPECIAL CARE FACTORS FREQUENCY  PT (By licensed PT)     PT Frequency: awaiting consult              Contractures Contractures Info: Not present    Additional Factors Info  Psychotropic, Insulin Sliding Scale Code Status Info: Full code Allergies Info: Doxycycline, Glipizide, Ibuprofen, Penicillins, Sulfonamide Derivatives, Tetracycline, Tolterodine Tartrate, Chlorostat, Cogentin, Oxybutynin Chloride Psychotropic Info: Abilify, Depakote, Haldol Insulin Sliding Scale Info: 3x daily with meals Isolation Precautions Info: 09/29/10 MRSA     Current Medications (12/12/2015):  This is the current hospital active medication list Current Facility-Administered Medications  Medication Dose Route Frequency Provider Last Rate Last Dose  . 0.9 %  sodium chloride infusion   Intravenous Continuous Kelvin Cellar, MD 125 mL/hr at 12/12/15 1346    . acetaminophen (TYLENOL) tablet 650 mg  650 mg Oral Q6H PRN Reyne Dumas, MD       Or  . acetaminophen (TYLENOL) suppository 650 mg  650 mg Rectal Q6H PRN Reyne Dumas, MD      .  albuterol (PROVENTIL) (2.5 MG/3ML) 0.083% nebulizer solution 2.5 mg  2.5 mg Nebulization Q6H Kelvin Cellar, MD   2.5 mg at 12/12/15 0310  . amantadine (SYMMETREL) capsule 100 mg  100 mg Oral BID Reyne Dumas, MD   100 mg at 12/11/15 0936  . ARIPiprazole (ABILIFY) tablet 15 mg  15 mg Oral BH-qamhs Reyne Dumas, MD   15 mg at 12/11/15 0929  . benztropine (COGENTIN)  tablet 0.5 mg  0.5 mg Oral BID Reyne Dumas, MD   0.5 mg at 12/11/15 0933  . budesonide (PULMICORT) nebulizer solution 0.25 mg  0.25 mg Nebulization BID Barton Dubois, MD   0.25 mg at 12/11/15 2123  . divalproex (DEPAKOTE) DR tablet 500 mg  500 mg Oral BH-q8a3p Reyne Dumas, MD   500 mg at 12/11/15 0929  . divalproex (DEPAKOTE) DR tablet 750 mg  750 mg Oral QHS Reyne Dumas, MD   750 mg at 12/10/15 2242  . enoxaparin (LOVENOX) injection 30 mg  30 mg Subcutaneous Q24H Reyne Dumas, MD   30 mg at 12/11/15 1737  . feeding supplement (ENSURE ENLIVE) (ENSURE ENLIVE) liquid 237 mL  237 mL Oral BID BM Reyne Dumas, MD   237 mL at 12/10/15 0842  . guaiFENesin (MUCINEX) 12 hr tablet 600 mg  600 mg Oral BID Reyne Dumas, MD   600 mg at 12/10/15 0835  . haloperidol (HALDOL) tablet 5 mg  5 mg Oral BID Reyne Dumas, MD   5 mg at 12/11/15 0933  . haloperidol lactate (HALDOL) injection 2 mg  2 mg Intravenous Q6H PRN Kelvin Cellar, MD   2 mg at 12/12/15 1200  . hydrOXYzine (ATARAX/VISTARIL) tablet 25 mg  25 mg Oral BID PRN Reyne Dumas, MD      . insulin aspart (novoLOG) injection 0-9 Units  0-9 Units Subcutaneous TID WC Reyne Dumas, MD   1 Units at 12/11/15 1738  . levalbuterol (XOPENEX) nebulizer solution 0.63 mg  0.63 mg Nebulization Q6H PRN Reyne Dumas, MD      . levofloxacin (LEVAQUIN) IVPB 500 mg  500 mg Intravenous Q48H Anh P Pham, RPH   500 mg at 12/12/15 1347  . levothyroxine (SYNTHROID, LEVOTHROID) injection 37.5 mcg  37.5 mcg Intravenous Daily Kelvin Cellar, MD   37.5 mcg at 12/12/15 1201  . ondansetron (ZOFRAN) tablet 4 mg  4 mg Oral Q6H PRN Reyne Dumas, MD       Or  . ondansetron (ZOFRAN) injection 4 mg  4 mg Intravenous Q6H PRN Reyne Dumas, MD      . oxyCODONE (Oxy IR/ROXICODONE) immediate release tablet 5 mg  5 mg Oral Q4H PRN Reyne Dumas, MD      . polyethylene glycol (MIRALAX / GLYCOLAX) packet 17 g  17 g Oral Daily PRN Reyne Dumas, MD      . sodium chloride 0.225 % with sodium  bicarbonate 50 mEq infusion   Intravenous Continuous Mauricia Area, MD 50 mL/hr at 12/12/15 1245    . sodium chloride 0.9 % bolus 500 mL  500 mL Intravenous Once Reyne Dumas, MD   500 mL at 12/08/15 1555  . sodium chloride flush (NS) 0.9 % injection 3 mL  3 mL Intravenous Q12H Reyne Dumas, MD   3 mL at 12/12/15 1000  . tiotropium (SPIRIVA) inhalation capsule 18 mcg  18 mcg Inhalation Daily Reyne Dumas, MD   18 mcg at 12/08/15 1539     Discharge Medications: Please see discharge summary for a list of discharge medications.  Relevant Imaging Results:  Relevant Lab Results:   Additional Information SS#: [id number]  Salome Arnt, [place]

## 2015-12-12 NOTE — Clinical Social Work Placement (Signed)
   CLINICAL SOCIAL WORK PLACEMENT  NOTE  Date:  12/12/2015  Patient Details  Name: [name] MRN: [id number] Date of Birth: [birth year]  Clinical Social Work is seeking post-discharge placement for this patient at the [place] level of care (*CSW will initial, date and re-position this form in  chart as items are completed):  Yes   Patient/family provided with [place] Work Department's list of facilities offering this level of care within the geographic area requested by the patient (or if unable, by the patient's family).  Yes   Patient/family informed of their freedom to choose among providers that offer the needed level of care, that participate in Medicare, Medicaid or managed care program needed by the patient, have an available bed and are willing to accept the patient.  Yes   Patient/family informed of [facility]'s ownership interest in [facility] and [facility], as well as of the fact that they are under no obligation to receive care at these facilities.  PASRR submitted to EDS on 12/12/15     PASRR number received on       Existing PASRR number confirmed on       FL2 transmitted to all facilities in geographic area requested by pt/family on 12/12/15     FL2 transmitted to all facilities within larger geographic area on       Patient informed that his/her managed care company has contracts with or will negotiate with certain facilities, including the following:            Patient/family informed of bed offers received.  Patient chooses bed at       Physician recommends and patient chooses bed at      Patient to be transferred to   on  .  Patient to be transferred to facility by       Patient family notified on   of transfer.  Name of family member notified:        PHYSICIAN       Additional Comment:    _______________________________________________ Salome Arnt, LCSW 12/12/2015, 2:34  PM [phone]

## 2015-12-12 NOTE — Clinical Social Work Note (Signed)
CSW was provided with contact information for [name] [id number], pt's payee. [name] reports that pt has been living at [facility] [phone]), transitional housing. [name] indicates pt is independent at baseline and has been at [facility] for about 3 months. She was living at [facility] prior to this. CSW will initiate bed search in anticipation of SNF need.   [name], [place]

## 2015-12-12 NOTE — Consult Note (Signed)
[place] [facility] Consult   Reason for Consult:  Delirium and psychosis Referring Physician:  Dr. Coralyn Pear Patient Identification: [name] MRN:  [id number] Principal Diagnosis: Bipolar disorder, current episode manic severe with psychotic features [facility]) Diagnosis:   Patient Active Problem List   Diagnosis Date Noted  . COPD with acute exacerbation ([place]) [J44.1] 12/08/2015  . HCAP (healthcare-associated pneumonia) [J18.9] 12/08/2015  . Acute renal failure ([place]) [N17.9]   . Skin lesions [L98.9] 07/04/2015  . Prediabetes [R73.03] 07/03/2015  . Bipolar disorder, current episode manic severe with psychotic features ([place]) [F31.2] 06/30/2015  . History of breast cancer in female [Z85.3] 04/10/2015  . Irreducible ventral hernia [K46.0] 04/12/2013  . HTN (hypertension) [I10]   . COPD (chronic obstructive pulmonary disease) ([place]) [J44.9]   . Chronic kidney disease [N18.9]   . Hyperlipidemia [E78.5]   . Hypothyroidism [E03.9]   . Bipolar affective ([place]) [F31.9]   . Anemia in chronic kidney disease(285.21) [N03.9, D63.1] 07/10/2011  . Thrombocytopenia ([place]) [D69.6] 07/10/2011  . BIPOLAR AFFECTIVE DISORDER [F31.9] 08/15/2006  . SMOKER [F17.200] 08/15/2006  . COPD [J44.9] 08/15/2006  . BOILS, RECURRENT [L02.92, L02.93] 08/15/2006  . ACNE ROSACEA [L71.9] 08/15/2006  . ACNE NEC [L70.8] 08/15/2006  . EDEMA [R60.9] 08/15/2006  . INCONTINENCE, URGE [N39.41] 08/15/2006    Total Time spent with patient: 1 hour  Subjective:   [name] is a 55 y.o. female patient admitted with manic psychosis.  HPI:  [name] is a 55 year old female with a history of COPD, hypertension, chronic kidney disease stage III, bipolar disorder, who presents to the ER because of malaise and shortness of breath and cough for the last 2 weeks. Patient has been in delirium, restless, trying OOB, constantly talking but mostly garbled, non comprehensive speech. She has no intact cognition and it  is a most complicated delirium due to both multiple medical problems and chronic schizoaffective disorder with acute exacerbations. She is known for agitation, violence and paranoid. Reviewed her latest admission discharge summery form [place] and medication at discharge. No information available since that time. We needs to get in touch with her out patient psych and Case manager and ACT team services regarding latest medication changes. She is a resident of boarding home, "[facility] for tomorrow".  Reportedly she has limited orientation and able to tell her address two days ago. Patient brother has no contact with her several years and not interested to be in her care as per staff.   Medical: Workup in the ER showed white count of 23.8. Creatinine 4.45 up from a baseline of 2, potassium 5.1. Chest x-ray shows bilateral lower lobe atelectasis vs pneumonia.  Past Psychiatric History: Schizoaffective disoder and recent admission to [facility] November 2016.   Risk to Self: Is patient at risk for suicide?: No Risk to Others:   Prior Inpatient Therapy:   Prior Outpatient Therapy:    Past Medical History:  Past Medical History  Diagnosis Date  . Anemia 07/10/2011  . Thrombocytopenia ([place]) 07/10/2011  . HTN (hypertension)   . COPD (chronic obstructive pulmonary disease) ([place])   . Hyperlipidemia   . Hypothyroidism   . Bipolar affective ([place])   . DM (diabetes mellitus) ([place])   . Renal insufficiency   . 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 Hypertension     Past Surgical History  Procedure Laterality Date  . Ankle surgery Left 1989  . Tonsillectomy  AGE 9  . Breast surgery Right 2010  BREAST  . Ventral hernia repair N/A 06/06/2013    Procedure:  OPEN VENTRAL HERNIA REPAIR;  Surgeon: Imogene Burn. Georgette Dover, MD;  Location: [facility];  Service: General;  Laterality: N/A;  . Insertion of mesh N/A 06/06/2013    Procedure: INSERTION OF MESH;  Surgeon: Imogene Burn. Georgette Dover, MD;  Location: [facility] OR[facility];   Service: General;  Laterality: N/A;   Family History:  Family History  Problem Relation Age of Onset  . Diabetes Mother   . Cancer Father   . HIV Brother    Family Psychiatric  History: unknown Social History:  History  Alcohol Use No     History  Drug Use No    Social History   Social History  . Marital Status: Divorced    Spouse Name: N/A  . Number of Children: N/A  . Years of Education: N/A   Social History Main Topics  . Smoking status: Current Every Day Smoker -- 0.50 packs/day for 40 years    Types: Cigarettes, Cigars  . Smokeless tobacco: Never Used  . Alcohol Use: No  . Drug Use: No  . Sexual Activity: Not Asked   Other Topics Concern  . None   Social History Narrative   Additional Social History:    Allergies:   Allergies  Allergen Reactions  . Doxycycline Other (See Comments)    REACTION: Unknown reaction  . Glipizide Other (See Comments)    unknown  . Ibuprofen Other (See Comments)    CANNOT TAKE IBUPROFEN OR MOTRIN DUE TO CURRENT MEDS  . Penicillins Other (See Comments)    Has patient had a PCN reaction causing immediate rash, facial/tongue/throat swelling, SOB or lightheadedness with hypotension: unknown Has patient had a PCN reaction causing severe rash involving mucus membranes or skin necrosis: unknown Has patient had a PCN reaction that required hospitalization Unknown Has patient had a PCN reaction occurring within the last 10 years: Unknown If all of the above answers are "NO", then may proceed with Cephalosporin use.   . Sulfonamide Derivatives     REACTION: Unknown reaction  . Tetracycline Other (See Comments)    REACTION: Unknown reaction  . Tolterodine Tartrate Itching  . Chlorostat [Chlorhexidine] Rash  . Cogentin [Benztropine] Rash  . Oxybutynin Chloride Itching and Rash    Labs:  Results for orders placed or performed during the hospital encounter of 12/08/15 (from the past 48 hour(s))  Glucose, capillary     Status:  Abnormal   Collection Time: 12/10/15  9:10 PM  Result Value Ref Range   Glucose-Capillary 126 (H) 65 - 99 mg/dL  Basic metabolic panel     Status: Abnormal   Collection Time: 12/11/15  5:07 AM  Result Value Ref Range   Sodium 147 (H) 135 - 145 mmol/L   Potassium 5.3 (H) 3.5 - 5.1 mmol/L   Chloride 118 (H) 101 - 111 mmol/L   CO2 19 (L) 22 - 32 mmol/L   Glucose, Bld 193 (H) 65 - 99 mg/dL   BUN 116 (H) 6 - 20 mg/dL    Comment: RESULTS CONFIRMED BY MANUAL DILUTION   Creatinine, Ser 3.92 (H) 0.44 - 1.00 mg/dL   Calcium 9.7 8.9 - 10.3 mg/dL   GFR calc non Af Amer 12 (L) >60 mL/min   GFR calc Af Amer 14 (L) >60 mL/min    Comment: (NOTE) The eGFR has been calculated using the CKD EPI equation. This calculation has not been validated in all clinical situations. eGFR's persistently <60 mL/min signify possible  Chronic Kidney Disease.    Anion gap 10 5 - 15  CBC     Status: Abnormal   Collection Time: 12/11/15  5:07 AM  Result Value Ref Range   WBC 10.2 4.0 - 10.5 K/uL   RBC 3.31 (L) 3.87 - 5.11 MIL/uL   Hemoglobin 10.9 (L) 12.0 - 15.0 g/dL   HCT 34.9 (L) 36.0 - 46.0 %   MCV 105.4 (H) 78.0 - 100.0 fL   MCH 32.9 26.0 - 34.0 pg   MCHC 31.2 30.0 - 36.0 g/dL   RDW 14.4 11.5 - 15.5 %   Platelets 127 (L) 150 - 400 K/uL  Glucose, capillary     Status: Abnormal   Collection Time: 12/11/15  8:31 AM  Result Value Ref Range   Glucose-Capillary 141 (H) 65 - 99 mg/dL  Glucose, capillary     Status: Abnormal   Collection Time: 12/11/15 12:11 PM  Result Value Ref Range   Glucose-Capillary 135 (H) 65 - 99 mg/dL  Glucose, capillary     Status: Abnormal   Collection Time: 12/11/15  5:18 PM  Result Value Ref Range   Glucose-Capillary 121 (H) 65 - 99 mg/dL  Glucose, capillary     Status: Abnormal   Collection Time: 12/11/15 10:01 PM  Result Value Ref Range   Glucose-Capillary 129 (H) 65 - 99 mg/dL  Basic metabolic panel     Status: Abnormal   Collection Time: 12/12/15  5:12 AM  Result Value  Ref Range   Sodium 154 (H) 135 - 145 mmol/L    Comment: REPEATED TO VERIFY   Potassium 5.5 (H) 3.5 - 5.1 mmol/L   Chloride 127 (H) 101 - 111 mmol/L   CO2 17 (L) 22 - 32 mmol/L   Glucose, Bld 139 (H) 65 - 99 mg/dL   BUN 127 (H) 6 - 20 mg/dL    Comment: RESULTS CONFIRMED BY MANUAL DILUTION   Creatinine, Ser 4.15 (H) 0.44 - 1.00 mg/dL   Calcium 10.4 (H) 8.9 - 10.3 mg/dL   GFR calc non Af Amer 11 (L) >60 mL/min   GFR calc Af Amer 13 (L) >60 mL/min    Comment: (NOTE) The eGFR has been calculated using the CKD EPI equation. This calculation has not been validated in all clinical situations. eGFR's persistently <60 mL/min signify possible Chronic Kidney Disease.    Anion gap 10 5 - 15  CBC     Status: Abnormal   Collection Time: 12/12/15  5:12 AM  Result Value Ref Range   WBC 15.4 (H) 4.0 - 10.5 K/uL   RBC 3.51 (L) 3.87 - 5.11 MIL/uL   Hemoglobin 11.6 (L) 12.0 - 15.0 g/dL   HCT 36.7 36.0 - 46.0 %   MCV 104.6 (H) 78.0 - 100.0 fL   MCH 33.0 26.0 - 34.0 pg   MCHC 31.6 30.0 - 36.0 g/dL   RDW 14.5 11.5 - 15.5 %   Platelets 160 150 - 400 K/uL  Differential     Status: Abnormal   Collection Time: 12/12/15  5:12 AM  Result Value Ref Range   Neutrophils Relative % 82 %   Lymphocytes Relative 14 %   Monocytes Relative 4 %   Eosinophils Relative 0 %   Basophils Relative 0 %   Neutro Abs 12.6 (H) 1.7 - 7.7 K/uL   Lymphs Abs 2.2 0.7 - 4.0 K/uL   Monocytes Absolute 0.6 0.1 - 1.0 K/uL   Eosinophils Absolute 0.0 0.0 - 0.7 K/uL   Basophils Absolute  0.0 0.0 - 0.1 K/uL   RBC Morphology RARE NRBCs    WBC Morphology MILD LEFT SHIFT (1-5% METAS, OCC MYELO, OCC BANDS)   Glucose, capillary     Status: Abnormal   Collection Time: 12/12/15  7:43 AM  Result Value Ref Range   Glucose-Capillary 117 (H) 65 - 99 mg/dL  Glucose, capillary     Status: Abnormal   Collection Time: 12/12/15 11:35 AM  Result Value Ref Range   Glucose-Capillary 114 (H) 65 - 99 mg/dL  Iron and TIBC     Status: Abnormal    Collection Time: 12/12/15 12:35 PM  Result Value Ref Range   Iron 73 28 - 170 ug/dL   TIBC 133 (L) 250 - 450 ug/dL   Saturation Ratios 55 (H) 10.4 - 31.8 %   UIBC 60 ug/dL    Comment: Performed at [facility]  Uric acid     Status: Abnormal   Collection Time: 12/12/15 12:35 PM  Result Value Ref Range   Uric Acid, Serum 15.6 (H) 2.3 - 6.6 mg/dL  CK     Status: None   Collection Time: 12/12/15 12:35 PM  Result Value Ref Range   Total CK 149 38 - 234 U/L  Osmolality     Status: Abnormal   Collection Time: 12/12/15 12:35 PM  Result Value Ref Range   Osmolality 372 (HH) 275 - 295 mOsm/kg    Comment: CRITICAL RESULT CALLED TO, READ BACK BY AND VERIFIED WITH: Desiree Lucy AT 1613 ON 4.14.17 BY W JOHNSON Performed at [facility]   Urinalysis, Routine w reflex microscopic (not at [facility])     Status: Abnormal   Collection Time: 12/12/15 12:45 PM  Result Value Ref Range   Color, Urine YELLOW YELLOW   APPearance CLOUDY (A) CLEAR   Specific Gravity, Urine 1.013 1.005 - 1.030   pH 5.5 5.0 - 8.0   Glucose, UA NEGATIVE NEGATIVE mg/dL   Hgb urine dipstick MODERATE (A) NEGATIVE   Bilirubin Urine NEGATIVE NEGATIVE   Ketones, ur NEGATIVE NEGATIVE mg/dL   Protein, ur 100 (A) NEGATIVE mg/dL   Nitrite NEGATIVE NEGATIVE   Leukocytes, UA NEGATIVE NEGATIVE  Urine microscopic-add on     Status: Abnormal   Collection Time: 12/12/15 12:45 PM  Result Value Ref Range   Squamous Epithelial / LPF 0-5 (A) NONE SEEN   WBC, UA 0-5 0 - 5 WBC/hpf   RBC / HPF 6-30 0 - 5 RBC/hpf   Bacteria, UA RARE (A) NONE SEEN   Urine-Other MUCOUS PRESENT   Sodium, urine, random     Status: None   Collection Time: 12/12/15  1:56 PM  Result Value Ref Range   Sodium, Ur 46 mmol/L    Comment: Performed at [facility]  Creatinine, urine, random     Status: None   Collection Time: 12/12/15  1:56 PM  Result Value Ref Range   Creatinine, Urine 85.74 mg/dL    Comment: Performed at [facility]  Osmolality, urine     Status: None   Collection Time: 12/12/15  1:57 PM  Result Value Ref Range   Osmolality, Ur 396 300 - 900 mOsm/kg    Comment: Performed at [facility]  Glucose, capillary     Status: None   Collection Time: 12/12/15  5:04 PM  Result Value Ref Range   Glucose-Capillary 93 65 - 99 mg/dL    Current Facility-Administered Medications  Medication Dose Route Frequency Provider Last Rate Last Dose  .  acetaminophen (TYLENOL) tablet 650 mg  650 mg Oral Q6H PRN Reyne Dumas, MD       Or  . acetaminophen (TYLENOL) suppository 650 mg  650 mg Rectal Q6H PRN Reyne Dumas, MD      . albuterol (PROVENTIL) (2.5 MG/3ML) 0.083% nebulizer solution 2.5 mg  2.5 mg Nebulization Q6H Kelvin Cellar, MD   2.5 mg at 12/12/15 0310  . amantadine (SYMMETREL) capsule 100 mg  100 mg Oral BID Reyne Dumas, MD   100 mg at 12/11/15 0936  . ARIPiprazole (ABILIFY) tablet 15 mg  15 mg Oral BH-qamhs Reyne Dumas, MD   15 mg at 12/11/15 0929  . benztropine (COGENTIN) tablet 0.5 mg  0.5 mg Oral BID Reyne Dumas, MD   0.5 mg at 12/11/15 0933  . budesonide (PULMICORT) nebulizer solution 0.25 mg  0.25 mg Nebulization BID Barton Dubois, MD   0.25 mg at 12/11/15 2123  . divalproex (DEPAKOTE) DR tablet 500 mg  500 mg Oral BH-[id number] Reyne Dumas, MD   500 mg at 12/11/15 0929  . divalproex (DEPAKOTE) DR tablet 750 mg  750 mg Oral QHS Reyne Dumas, MD   750 mg at 12/10/15 2242  . enoxaparin (LOVENOX) injection 30 mg  30 mg Subcutaneous Q24H Reyne Dumas, MD   30 mg at 12/11/15 1737  . feeding supplement (ENSURE ENLIVE) (ENSURE ENLIVE) liquid 237 mL  237 mL Oral BID BM Reyne Dumas, MD   237 mL at 12/10/15 0842  . guaiFENesin (MUCINEX) 12 hr tablet 600 mg  600 mg Oral BID Reyne Dumas, MD   600 mg at 12/10/15 0835  . haloperidol (HALDOL) tablet 5 mg  5 mg Oral BID Reyne Dumas, MD   5 mg at 12/11/15 0933  . haloperidol lactate (HALDOL) injection 2 mg  2 mg Intravenous Q6H PRN Kelvin Cellar, MD   2  mg at 12/12/15 1200  . hydrOXYzine (ATARAX/VISTARIL) tablet 25 mg  25 mg Oral BID PRN Reyne Dumas, MD      . insulin aspart (novoLOG) injection 0-9 Units  0-9 Units Subcutaneous TID WC Reyne Dumas, MD   1 Units at 12/11/15 1738  . levalbuterol (XOPENEX) nebulizer solution 0.63 mg  0.63 mg Nebulization Q6H PRN Reyne Dumas, MD      . levofloxacin (LEVAQUIN) IVPB 500 mg  500 mg Intravenous Q48H Anh P Pham, RPH   500 mg at 12/12/15 1347  . levothyroxine (SYNTHROID, LEVOTHROID) injection 37.5 mcg  37.5 mcg Intravenous Daily Kelvin Cellar, MD   37.5 mcg at 12/12/15 1201  . ondansetron (ZOFRAN) tablet 4 mg  4 mg Oral Q6H PRN Reyne Dumas, MD       Or  . ondansetron (ZOFRAN) injection 4 mg  4 mg Intravenous Q6H PRN Reyne Dumas, MD      . oxyCODONE (Oxy IR/ROXICODONE) immediate release tablet 5 mg  5 mg Oral Q4H PRN Reyne Dumas, MD      . polyethylene glycol (MIRALAX / GLYCOLAX) packet 17 g  17 g Oral Daily PRN Reyne Dumas, MD      . sodium chloride 0.225 % with sodium bicarbonate 50 mEq infusion   Intravenous Continuous Mauricia Area, MD 50 mL/hr at 12/12/15 1245    . sodium chloride 0.9 % bolus 500 mL  500 mL Intravenous Once Reyne Dumas, MD   500 mL at 12/08/15 1555  . sodium chloride flush (NS) 0.9 % injection 3 mL  3 mL Intravenous Q12H Reyne Dumas, MD   3 mL at 12/12/15 1000  .  tiotropium (SPIRIVA) inhalation capsule 18 mcg  18 mcg Inhalation Daily Reyne Dumas, MD   18 mcg at 12/08/15 1539    Musculoskeletal: Strength & Muscle Tone: decreased Gait & Station: unable to stand Patient leans: N/A  Psychiatric Specialty Exam: Review of Systems  Unable to perform ROS    Blood pressure 116/67, pulse 111, temperature 98.1 F (36.7 C), temperature source Axillary, resp. rate 22, height 5' 5"  (1.651 m), weight 86.183 kg (190 lb), SpO2 93 %.Body mass index is 31.62 kg/(m^2).  General Appearance: Bizarre and Disheveled  Eye Contact::  Poor  Speech:  Garbled and Slurred  Volume:  Normal   Mood:  Anxious, Depressed and Irritable  Affect:  Inappropriate and Labile  Thought Process:  Disorganized  Orientation:  Negative  Thought Content:  Delusions  Suicidal Thoughts:  No  Homicidal Thoughts:  No  Memory:  Negative  Judgement:  Poor  Insight:  Negative  Psychomotor Activity:  Restlessness  Concentration:  Poor  Recall:  Poor  Fund of Knowledge:Poor  Language: Poor  Akathisia:  Yes  Handed:  Right  AIMS (if indicated):     Assets:  Others:  differed  ADL's:  Impaired  Cognition: Impaired,  Severe  Sleep:      Treatment Plan Summary: Daily contact with patient to assess and evaluate symptoms and progress in treatment and Medication management   Medication recommendations: Continue Abilify 15 mg PO BID if she can tolerate Continue Depakene 750 mg IV BID for bipolar mania Continue Synthroid for hypothyroidsm Discontinue Haldol regular dose and cogentin - due to increased delirium Start Ativan 1 mg IV Q6 hours for restlessness and agitation  Will check with her last Abilify Maintena 400 mg QMonth - does not know her last dose Unit social service to contact the case manager or ACT team regarding last dose of Abilify shot and next due date.   Appreciate psychiatric consultation and follow up as clinically required Please contact [phone] or [phone] if needs further assistance  Disposition: Recommend psychiatric Inpatient admission when medically cleared. Supportive therapy provided about ongoing stressors.  Durward Parcel., MD 12/12/2015 5:50 PM

## 2015-12-12 NOTE — Progress Notes (Signed)
This Probation officer spoke with Dr. Coralyn Pear [phone] to collect information for the psych consult.  This Probation officer spoke with Dr. Lenna Sciara and provided all information for the consult such as chief complaint of Altered Mental Status and hx inpatient tx Bipolar D/O.   Chesley Noon, MSW, Darlyn Read [facility] Triage Specialist [phone]

## 2015-12-12 NOTE — Progress Notes (Signed)
Pharmacy Antibiotic Note  [name] is a 55 y.o. female with renal insufficiency admitted on 12/08/2015 with HCAP/sepsis. Patient received 1 dose of Primaxin and 1 dose of levofloxacin on admission.  Patient pulled out IV line access line on 4/11 early morning and missed her vancomycin and Primaxin doses.  Pharmacy following patient for Levaquin dosing.  4/14: Patient is afebrile, WBC increased today to 15.4, SCr remains high at 4.15 (CrCl ~16).  Plan: - Continue Levaquin 500 mg IV q48h. - F/u cultures and renal fxn.  ________________________________  Height: 5\' 5"  (165.1 cm) Weight: 190 lb (86.183 kg) IBW/kg (Calculated) : 57  Temp (24hrs), Avg:98.1 F (36.7 C), Min:98.1 F (36.7 C), Max:98.2 F (36.8 C)   Recent Labs Lab 12/08/15 1205 12/08/15 1221 12/08/15 1533 12/08/15 1606 12/08/15 1655 12/09/15 0516 12/10/15 0500 12/11/15 0507 12/12/15 0512  WBC 23.8*  --   --   --   --  12.7* 16.3* 10.2 15.4*  CREATININE 4.45*  --   --   --   --  4.30* 4.11* 3.92* 4.15*  LATICACIDVEN  --  1.10 0.78 0.9 0.8  --   --   --   --     Estimated Creatinine Clearance: 16.2 mL/min (by C-G formula based on Cr of 4.15).    Allergies  Allergen Reactions  . Doxycycline Other (See Comments)    REACTION: Unknown reaction  . Glipizide Other (See Comments)    unknown  . Ibuprofen Other (See Comments)    CANNOT TAKE IBUPROFEN OR MOTRIN DUE TO CURRENT MEDS  . Penicillins Other (See Comments)    Has patient had a PCN reaction causing immediate rash, facial/tongue/throat swelling, SOB or lightheadedness with hypotension: unknown Has patient had a PCN reaction causing severe rash involving mucus membranes or skin necrosis: unknown Has patient had a PCN reaction that required hospitalization Unknown Has patient had a PCN reaction occurring within the last 10 years: Unknown If all of the above answers are "NO", then may proceed with Cephalosporin use.   . Sulfonamide Derivatives    REACTION: Unknown reaction  . Tetracycline Other (See Comments)    REACTION: Unknown reaction  . Tolterodine Tartrate Itching  . Chlorostat [Chlorhexidine] Rash  . Cogentin [Benztropine] Rash  . Oxybutynin Chloride Itching and Rash    Antimicrobials this admission: 4/10 levofloxacin >> 4/10 vancomycin >> 4/11 4/10 Primaxin >> 4/11  Dose adjustments this admission:   Microbiology results: 4/10 BCx: NGTD 4/10 UCx: insignificant growth 4/10 Sputum:  4/10 MRSA PCR: negative  Thank you for allowing pharmacy to be a part of this patient's care.  Tonette Bihari, PharmD candidate 12/12/2015 11:41 AM

## 2015-12-12 NOTE — Evaluation (Signed)
SLP Cancellation Note  Patient Details Name: [name] MRN: [id number] DOB: 1960/10/23   Cancelled treatment:        pt remains not able to participate in evaluation at this time.  Will reattempt. rec npo  Please page w/e slp if needed Sat/Sun at [phone].     [name], [place] [facility] SLP [phone]

## 2015-12-12 NOTE — Clinical Social Work Note (Signed)
CSW attempted to reach pt's facility multiple times to complete assessment, but no answer or return call.    [name], [place]

## 2015-12-12 NOTE — Progress Notes (Signed)
Patient continues to be confused, restless, and also combative at times.  Unable to administer any of her medications, continually getting her back in bed, and she is unable to follow commands.  Will continue to monitor patient.

## 2015-12-12 NOTE — Progress Notes (Signed)
CRITICAL VALUE ALERT  Critical value received:  Serum osmolality   Date of notification:  12/12/2015  Time of notification:  1614  Critical value read back:Yes.    Nurse who received alert:  Wess Botts  MD notified (1st page):  Coralyn Pear  Time of first page:  1615  MD notified (2nd page):  Time of second page:  Responding MD:    Time MD responded:

## 2015-12-12 NOTE — Consult Note (Signed)
Reason for Consult:?AKI, CKD4 Referring Physician: Dr. Liliane Bade is an 55 y.o. female.  HPI: 55 yr female with hx bipolar dz, hs breast CA, DM , HTn, hypothyroid, anemia and CKD 4 .  Has seen Dr. Justin Mend last time being 11/16.  Most recent labs prior to this 10/16  With Cr 2.28.  Admitted with Cr of 4.45, has been as low as 3.96 yest, now 4/15.  Admitted with SOB and confusion.  Progressive confusion and inability to interact.  SNa now also 157.  Urine not recorded at all and she cannot give hx.  Was on Ramipril and Meloxicam on admit. Review of systems not obtained due to patient factors.   Past Medical History  Diagnosis Date  . Anemia 07/10/2011  . Thrombocytopenia ([place]) 07/10/2011  . HTN (hypertension)   . COPD (chronic obstructive pulmonary disease) ([place])   . Hyperlipidemia   . Hypothyroidism   . Bipolar affective ([place])   . DM (diabetes mellitus) ([place])   . Renal insufficiency   . 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 Hypertension     Past Surgical History  Procedure Laterality Date  . Ankle surgery Left 1989  . Tonsillectomy  AGE 42  . Breast surgery Right 2010    BREAST  . Ventral hernia repair N/A 06/06/2013    Procedure:  OPEN VENTRAL HERNIA REPAIR;  Surgeon: Imogene Burn. Georgette Dover, MD;  Location: [facility];  Service: General;  Laterality: N/A;  . Insertion of mesh N/A 06/06/2013    Procedure: INSERTION OF MESH;  Surgeon: Imogene Burn. Georgette Dover, MD;  Location: [facility];  Service: General;  Laterality: N/A;    Family History  Problem Relation Age of Onset  . Diabetes Mother   . Cancer Father   . HIV Brother     Social History:  reports that she has been smoking Cigarettes and Cigars.  She has a 20 pack-year smoking history. She has never used smokeless tobacco. She reports that she does not drink alcohol or use illicit drugs.  Allergies:  Allergies  Allergen Reactions  . Doxycycline Other (See Comments)    REACTION: Unknown reaction  .  Glipizide Other (See Comments)    unknown  . Ibuprofen Other (See Comments)    CANNOT TAKE IBUPROFEN OR MOTRIN DUE TO CURRENT MEDS  . Penicillins Other (See Comments)    Has patient had a PCN reaction causing immediate rash, facial/tongue/throat swelling, SOB or lightheadedness with hypotension: unknown Has patient had a PCN reaction causing severe rash involving mucus membranes or skin necrosis: unknown Has patient had a PCN reaction that required hospitalization Unknown Has patient had a PCN reaction occurring within the last 10 years: Unknown If all of the above answers are "NO", then may proceed with Cephalosporin use.   . Sulfonamide Derivatives     REACTION: Unknown reaction  . Tetracycline Other (See Comments)    REACTION: Unknown reaction  . Tolterodine Tartrate Itching  . Chlorostat [Chlorhexidine] Rash  . Cogentin [Benztropine] Rash  . Oxybutynin Chloride Itching and Rash    Medications:  I have reviewed the patient's current medications. Prior to Admission:  Prescriptions prior to admission  Medication Sig Dispense Refill Last Dose  . acetaminophen (TYLENOL) 500 MG tablet Take 1 tablet (500 mg total) by mouth every 6 (six) hours as needed. 30 tablet 0   . albuterol (PROVENTIL HFA;VENTOLIN HFA) 108 (90 BASE) MCG/ACT inhaler Inhale 1-2 puffs into the lungs every 6 (  six) hours as needed for wheezing or shortness of breath.     Marland Kitchen amantadine (SYMMETREL) 100 MG capsule Take 100 mg by mouth 2 (two) times daily.     . ARIPiprazole (ABILIFY) 15 MG tablet Take 1 tablet (15 mg total) by mouth 2 (two) times daily in the am and at bedtime.. 60 tablet 0   . ARIPiprazole 400 MG SUSR Inject 400 mg into the muscle every 28 (twenty-eight) days. To be given at office. 1 each 0   . atorvastatin (LIPITOR) 40 MG tablet Take 1 tablet (40 mg total) by mouth at bedtime.     . benztropine (COGENTIN) 0.5 MG tablet Take 1 tablet (0.5 mg total) by mouth 2 (two) times daily. 60 tablet 0   . calcitRIOL  (ROCALTROL) 0.25 MCG capsule Take 0.25 mcg by mouth as directed. Take 0.28m daily on Monday Wednesday and Friday     . carvedilol (COREG) 3.125 MG tablet Take 1 tablet (3.125 mg total) by mouth every morning.     . divalproex (DEPAKOTE) 250 MG DR tablet Take 3 tablets (750 mg total) by mouth at bedtime. 90 tablet 0   . divalproex (DEPAKOTE) 500 MG DR tablet Take 1 tablet (500 mg total) by mouth 2 (two) times daily at 8am and 3pm. (Patient taking differently: Take 1,000 mg by mouth 2 (two) times daily. ) 60 tablet 0   . Fluticasone-Salmeterol (ADVAIR) 250-50 MCG/DOSE AEPB Inhale 1 puff into the lungs 2 (two) times daily. 60 each    . gabapentin (NEURONTIN) 400 MG capsule Take 1 capsule (400 mg total) by mouth 3 (three) times daily at 8am, 3pm and bedtime. 90 capsule 0   . haloperidol (HALDOL) 5 MG tablet Take 5 mg by mouth 2 (two) times daily.     . hydrocortisone cream 1 % Apply topically 3 (three) times daily. 30 g 0   . hydrOXYzine (ATARAX/VISTARIL) 25 MG tablet Take 1 tablet (25 mg total) by mouth 2 (two) times daily as needed for anxiety. 30 tablet 0   . levothyroxine (SYNTHROID, LEVOTHROID) 75 MCG tablet Take 1 tablet (75 mcg total) by mouth daily before breakfast. 30 tablet 0   . meloxicam (MOBIC) 7.5 MG tablet Take 7.5 mg by mouth daily.     . Multiple Vitamin (MULTIVITAMIN WITH MINERALS) TABS tablet Take 1 tablet by mouth daily.     .Marland Kitchennystatin ointment (MYCOSTATIN) Apply topically 2 (two) times daily. 30 g 0   . ramipril (ALTACE) 2.5 MG capsule Take 1 capsule (2.5 mg total) by mouth every morning.     . zolpidem (AMBIEN) 10 MG tablet Take 1 tablet (10 mg total) by mouth at bedtime. 30 tablet 0 08/15/2015 at Unknown time   , Calcitriol .25 mcg qod. Results for orders placed or performed during the hospital encounter of 12/08/15 (from the past 48 hour(s))  Glucose, capillary     Status: Abnormal   Collection Time: 12/10/15 12:27 PM  Result Value Ref Range   Glucose-Capillary 179 (H) 65 -  99 mg/dL  Glucose, capillary     Status: Abnormal   Collection Time: 12/10/15  5:23 PM  Result Value Ref Range   Glucose-Capillary 177 (H) 65 - 99 mg/dL  Glucose, capillary     Status: Abnormal   Collection Time: 12/10/15  9:10 PM  Result Value Ref Range   Glucose-Capillary 126 (H) 65 - 99 mg/dL  Basic metabolic panel     Status: Abnormal   Collection Time: 12/11/15  5:07 AM  Result Value Ref Range   Sodium 147 (H) 135 - 145 mmol/L   Potassium 5.3 (H) 3.5 - 5.1 mmol/L   Chloride 118 (H) 101 - 111 mmol/L   CO2 19 (L) 22 - 32 mmol/L   Glucose, Bld 193 (H) 65 - 99 mg/dL   BUN 116 (H) 6 - 20 mg/dL    Comment: RESULTS CONFIRMED BY MANUAL DILUTION   Creatinine, Ser 3.92 (H) 0.44 - 1.00 mg/dL   Calcium 9.7 8.9 - 10.3 mg/dL   GFR calc non Af Amer 12 (L) >60 mL/min   GFR calc Af Amer 14 (L) >60 mL/min    Comment: (NOTE) The eGFR has been calculated using the CKD EPI equation. This calculation has not been validated in all clinical situations. eGFR's persistently <60 mL/min signify possible Chronic Kidney Disease.    Anion gap 10 5 - 15  CBC     Status: Abnormal   Collection Time: 12/11/15  5:07 AM  Result Value Ref Range   WBC 10.2 4.0 - 10.5 K/uL   RBC 3.31 (L) 3.87 - 5.11 MIL/uL   Hemoglobin 10.9 (L) 12.0 - 15.0 g/dL   HCT 34.9 (L) 36.0 - 46.0 %   MCV 105.4 (H) 78.0 - 100.0 fL   MCH 32.9 26.0 - 34.0 pg   MCHC 31.2 30.0 - 36.0 g/dL   RDW 14.4 11.5 - 15.5 %   Platelets 127 (L) 150 - 400 K/uL  Glucose, capillary     Status: Abnormal   Collection Time: 12/11/15  8:31 AM  Result Value Ref Range   Glucose-Capillary 141 (H) 65 - 99 mg/dL  Glucose, capillary     Status: Abnormal   Collection Time: 12/11/15 12:11 PM  Result Value Ref Range   Glucose-Capillary 135 (H) 65 - 99 mg/dL  Glucose, capillary     Status: Abnormal   Collection Time: 12/11/15  5:18 PM  Result Value Ref Range   Glucose-Capillary 121 (H) 65 - 99 mg/dL  Glucose, capillary     Status: Abnormal   Collection  Time: 12/11/15 10:01 PM  Result Value Ref Range   Glucose-Capillary 129 (H) 65 - 99 mg/dL  Basic metabolic panel     Status: Abnormal   Collection Time: 12/12/15  5:12 AM  Result Value Ref Range   Sodium 154 (H) 135 - 145 mmol/L    Comment: REPEATED TO VERIFY   Potassium 5.5 (H) 3.5 - 5.1 mmol/L   Chloride 127 (H) 101 - 111 mmol/L   CO2 17 (L) 22 - 32 mmol/L   Glucose, Bld 139 (H) 65 - 99 mg/dL   BUN 127 (H) 6 - 20 mg/dL    Comment: RESULTS CONFIRMED BY MANUAL DILUTION   Creatinine, Ser 4.15 (H) 0.44 - 1.00 mg/dL   Calcium 10.4 (H) 8.9 - 10.3 mg/dL   GFR calc non Af Amer 11 (L) >60 mL/min   GFR calc Af Amer 13 (L) >60 mL/min    Comment: (NOTE) The eGFR has been calculated using the CKD EPI equation. This calculation has not been validated in all clinical situations. eGFR's persistently <60 mL/min signify possible Chronic Kidney Disease.    Anion gap 10 5 - 15  CBC     Status: Abnormal   Collection Time: 12/12/15  5:12 AM  Result Value Ref Range   WBC 15.4 (H) 4.0 - 10.5 K/uL   RBC 3.51 (L) 3.87 - 5.11 MIL/uL   Hemoglobin 11.6 (L) 12.0 - 15.0 g/dL   HCT 36.7  36.0 - 46.0 %   MCV 104.6 (H) 78.0 - 100.0 fL   MCH 33.0 26.0 - 34.0 pg   MCHC 31.6 30.0 - 36.0 g/dL   RDW 14.5 11.5 - 15.5 %   Platelets 160 150 - 400 K/uL  Glucose, capillary     Status: Abnormal   Collection Time: 12/12/15  7:43 AM  Result Value Ref Range   Glucose-Capillary 117 (H) 65 - 99 mg/dL    Dg Chest 2 View  12/11/2015  CLINICAL DATA:  Pneumonia. EXAM: CHEST  2 VIEW COMPARISON:  12/08/2015 and 12/09/2015 FINDINGS: Patient slightly rotated to the left. Lungs are hypoinflated with continued interval improvement in subtle opacification in the right base and mild persistent opacification of the left base. Findings likely represent resolving atelectasis or infection. No definite effusion. Cardiomediastinal silhouette and remainder of the exam is unchanged. IMPRESSION: Improving bibasilar opacification likely  resolving infection or atelectasis. Electronically Signed   By: Marin Olp M.D.   On: 12/11/2015 14:47   Ct Head Wo Contrast  12/10/2015  CLINICAL DATA:  Altered mental status/confusion EXAM: CT HEAD WITHOUT CONTRAST TECHNIQUE: Contiguous axial images were obtained from the base of the skull through the vertex without intravenous contrast. COMPARISON:  January 29, 2013 FINDINGS: There is mild diffuse atrophy for age, stable. There is no intracranial mass, hemorrhage, extra-axial fluid collection, or midline shift. The gray-white compartments appear normal. No acute infarct evident. The bony calvarium appears intact. There is opacification of most of the mastoid air cells bilaterally, more severe on the right than on the left. There is extensive mucosal thickening in the left maxillary antrum. There is opacification of multiple ethmoid air cells bilaterally. There is also diffuse opacity in the frontal sinuses bilaterally. No intraorbital lesions evident. IMPRESSION: Mild diffuse atrophy for age. No intracranial mass, hemorrhage, or focal gray - white compartment lesion. Extensive mastoid air cell disease bilaterally as well as multifocal paranasal sinus disease. Electronically Signed   By: Lowella Grip III M.D.   On: 12/10/2015 18:06   US Renal  12/12/2015  CLINICAL DATA:  Acute kidney injury. EXAM: RENAL / URINARY TRACT ULTRASOUND COMPLETE COMPARISON:  04/16/2013 FINDINGS: Right Kidney: Length: 12.3 cm. Diffusely increased echotexture. No mass or hydronephrosis. Left Kidney: Length: 13.3 cm. 6.2 cm cyst in the mid to lower pole. Area of calcifications noted along the posterior wall as seen on prior CT. Increased echotexture throughout the left kidney. No hydronephrosis. Bladder: Foley catheter in place, decompressed. IMPRESSION: Increased echotexture within the kidneys bilaterally compatible chronic medical renal disease. 6.2 cm minimally complex cyst in the left kidney. No hydronephrosis. Electronically  Signed   By: Rolm Baptise M.D.   On: 12/12/2015 09:37    ROS Blood pressure 114/63, pulse 113, temperature 98.2 F (36.8 C), temperature source Axillary, resp. rate 22, height 5' 5"  (1.651 m), weight 86.183 kg (190 lb), SpO2 92 %. Physical Exam Physical Examination: General appearance - confused perseverating ,mumbling to self Mental status - no coop or obey commands, mumbling,persev Eyes - not coop to eval Mouth - not coop to exam Neck - adenopathy noted PCL Lymphatics - posterior cervical nodes Chest - rales noted bibasilar, decreased air entry noted bilat, rhonchi noted bibaslar Heart - S1 and S2 normal Abdomen - soft, nontender, nondistended, no masses or organomegaly Neurological - moves all extrem to stim, sym facies, moves eyes all quad but not coop Extremities - pedal edema Tr +, pulses 1+/4+  Skin - thin sallow colored  Assessment/Plan: 1  CKD 4 at baseline, GFR low 20s. Etio not clear.  Now mildly worse. Azotemic with steroids. Has big risk for AKI and suspect has some component with ACEI and NSAID and poor intake.  ? Role of other drug OD.  ? Has mild DI with ^ SNa, needs free water..  Do not feel is uremic as has too much chronic dz.  Not obstructed. Needs to know urine vol, Has acidemia, will tx. Vol ok.  Mild ^ K , should be better with bicarb. 2 AMS suspect drug and underlying Psych status 3 Hypertension: not an issue. This person should not be on ACEI 4. Anemia follow 5. Metabolic Bone Disease: check PTh , Has ^ Ca 6 Hypercalcemia 7 DM 8 COPD P UA, Urine chem, Osm, iv with free water and bicarb. Foley, diff, CK,   Deone Leifheit L 12/12/2015, 12:04 PM

## 2015-12-12 NOTE — Progress Notes (Signed)
[facility] PROGRESS NOTE  PEACHIE HEGLAND [id number] DOB: Apr 16, 1961 DOA: 12/08/2015 PCP: Benito Mccreedy, MD  Assessment/Plan:  Acute encephalopathy -On my evaluation Mrs. Bunner encephalopathic, confused, disoriented, unable to follow commands for me. -Unclear etiology,  include withdrawal, infection, medication induced, psychiatric disorder -During this hospitalization she has received IV Ativan -CT scan of brain that revealed intracranial mass, hemorrhage, lesions. Chest x-ray showing improvement to atelectasis versus infiltrates involving bases. -Attempted to get a hold of family members and a group home to get an idea of her baseline status. -Case discussed with psychiatry and nephrology on 12/12/2015. There is the possibility of psychiatric medications contributing particular with acute on chronic renal failure. I will allow psychiatry to assess her prior to making any changes to psychiatric medications. Dr Deterding did not feel this represented uremia.  Acute kidney injury superimposed in CKD (stage 3-4 at baseline) -Initially it was suspected that renal failure was secondary to ACE inhibitor therapy and possibly prerenal azotemia, however, Creatinine now trending up from 3.9-4.15 on a.m. lab work on 12/12/2015.  -Bilateral renal ultrasound did not reveal evidence of hydronephrosis. -Repeat urinalysis unremarkable -A Foley catheter was placed for accurate measurement of in and out's -Nephrology was consulted on 12/12/2015.   COPD exacerbation -Suspect precipitated by infectious process as chest x-ray showed patchy bilateral lower lobe pneumonia -Will discontinue IV steroids as she has shown improvement from a respiratory standpoint  Community acquire pneumonia.  -Patient presented with complaints of shortness of breath and cough with chest x-ray showing findings consistent with pneumonia. -Repeat chest x-ray on 12/11/2015 showing near resolution of bibasilar  infiltrates -Will continue Levaquin 500 mg IV every 48 hours  Bipolar disorder: with psychotic features and recurrent manic episodes in recent past -Continue haloperidol 5 mg by mouth twice a day, Abilify 15 mg by mouth twice a day -She has had previous admissions to inpatient psychiatric facility -Case discussed with Dr. Lenna Sciara of psychiatry on 12/12/2015.  Will continue current psychiatric medications until assessed by psychiatry.   Overweight  -Body mass index is 31.62 kg/(m^2). -when more alert, will discussed weight loss and low calorie diet    Code Status: Full Family Communication: no family at bedside  Disposition Plan She'll likely require rehabilitation at skilled nursing facility   Consultants:  None   Procedures:  See below for x-ray reports   Antibiotics:  levaquin 4/11  HPI/Subjective: She remains agitated, having difficulty following commands.  Objective: Filed Vitals:   12/12/15 0526 12/12/15 1300  BP: 114/63 116/67  Pulse: 113 111  Temp: 98.2 F (36.8 C) 98.1 F (36.7 C)  Resp:      Intake/Output Summary (Last 24 hours) at 12/12/15 1550 Last data filed at 12/11/15 1900  Gross per 24 hour  Intake    500 ml  Output      0 ml  Net    500 ml   Filed Weights   12/08/15 1546 12/08/15 1751  Weight: 86.183 kg (190 lb) 86.183 kg (190 lb)    Exam:   General:  She is agitated, restless, confused, disoriented  Cardiovascular: S1 and S2, no rubs or gallops  Respiratory: diffuse wheezing and rhonchi, no crackles  Abdomen: soft, NT, positive BS  Musculoskeletal: trace edema bilaterally  Data Reviewed: Basic Metabolic Panel:  Recent Labs Lab 12/08/15 1205 12/08/15 1606 12/09/15 0516 12/10/15 0500 12/11/15 0507 12/12/15 0512  NA 136  --  139 144 147* 154*  K 5.1  --  5.2* 5.7* 5.3* 5.5*  CL 105  --  110 117* 118* 127*  CO2 20*  --  20* 17* 19* 17*  GLUCOSE 127*  --  191* 162* 193* 139*  BUN 81*  --  92* 100* 116* 127*  CREATININE 4.45*   --  4.30* 4.11* 3.92* 4.15*  CALCIUM 9.5  --  9.7 10.0 9.7 10.4*  MG  --  1.9  --   --   --   --    Liver Function Tests:  Recent Labs Lab 12/08/15 1530 12/09/15 0516  AST 85* 66*  ALT 52 48  ALKPHOS 112 94  BILITOT 0.8 0.4  PROT 7.1 6.6  ALBUMIN 2.3* 2.0*   CBC:  Recent Labs Lab 12/08/15 1205 12/09/15 0516 12/10/15 0500 12/11/15 0507 12/12/15 0512  WBC 23.8* 12.7* 16.3* 10.2 15.4*  NEUTROABS  --   --   --   --  12.6*  HGB 11.4* 10.9* 11.1* 10.9* 11.6*  HCT 34.5* 32.9* 34.3* 34.9* 36.7  MCV 101.2* 98.5 101.2* 105.4* 104.6*  PLT 183 136* 146* 127* 160   Cardiac Enzymes:  Recent Labs Lab 12/08/15 1205 12/08/15 1530 12/08/15 2125 12/09/15 0516 12/12/15 1235  CKTOTAL  --   --   --   --  149  TROPONINI 0.08* 0.03 0.03 0.03  --    BNP (last 3 results)  Recent Labs  12/08/15 1205  BNP 556.7*   CBG:  Recent Labs Lab 12/11/15 1211 12/11/15 1718 12/11/15 2201 12/12/15 0743 12/12/15 1135  GLUCAP 135* 121* 129* 117* 114*    Recent Results (from the past 240 hour(s))  Culture, blood (routine x 2) Call MD if unable to obtain prior to antibiotics being given     Status: None (Preliminary result)   Collection Time: 12/08/15  3:21 PM  Result Value Ref Range Status   Specimen Description BLOOD LEFT ANTECUBITAL  Final   Special Requests BOTTLES DRAWN AEROBIC AND ANAEROBIC 5CC  Final   Culture   Final    NO GROWTH 4 DAYS Performed at [facility]    Report Status PENDING  Incomplete  Culture, blood (routine x 2) Call MD if unable to obtain prior to antibiotics being given     Status: None (Preliminary result)   Collection Time: 12/08/15  4:06 PM  Result Value Ref Range Status   Specimen Description BLOOD LEFT FOREARM  Final   Special Requests IN PEDIATRIC BOTTLE 2CC  Final   Culture   Final    NO GROWTH 4 DAYS Performed at [facility]    Report Status PENDING  Incomplete  MRSA PCR Screening     Status: None   Collection Time: 12/08/15  10:52 PM  Result Value Ref Range Status   MRSA by PCR NEGATIVE NEGATIVE Final    Comment:        The GeneXpert MRSA Assay (FDA approved for NASAL specimens only), is one component of a comprehensive MRSA colonization surveillance program. It is not intended to diagnose MRSA infection nor to guide or monitor treatment for MRSA infections.   Urine culture     Status: Abnormal   Collection Time: 12/08/15 11:26 PM  Result Value Ref Range Status   Specimen Description URINE, CLEAN CATCH  Final   Special Requests NONE  Final   Culture (A)  Final    1,000 COLONIES/mL INSIGNIFICANT GROWTH Performed at [facility]    Report Status 12/10/2015 FINAL  Final     Studies: Dg Chest 2 View  12/11/2015  CLINICAL DATA:  Pneumonia.  EXAM: CHEST  2 VIEW COMPARISON:  12/08/2015 and 12/09/2015 FINDINGS: Patient slightly rotated to the left. Lungs are hypoinflated with continued interval improvement in subtle opacification in the right base and mild persistent opacification of the left base. Findings likely represent resolving atelectasis or infection. No definite effusion. Cardiomediastinal silhouette and remainder of the exam is unchanged. IMPRESSION: Improving bibasilar opacification likely resolving infection or atelectasis. Electronically Signed   By: Marin Olp M.D.   On: 12/11/2015 14:47   Ct Head Wo Contrast  12/10/2015  CLINICAL DATA:  Altered mental status/confusion EXAM: CT HEAD WITHOUT CONTRAST TECHNIQUE: Contiguous axial images were obtained from the base of the skull through the vertex without intravenous contrast. COMPARISON:  January 29, 2013 FINDINGS: There is mild diffuse atrophy for age, stable. There is no intracranial mass, hemorrhage, extra-axial fluid collection, or midline shift. The gray-white compartments appear normal. No acute infarct evident. The bony calvarium appears intact. There is opacification of most of the mastoid air cells bilaterally, more severe on the right than  on the left. There is extensive mucosal thickening in the left maxillary antrum. There is opacification of multiple ethmoid air cells bilaterally. There is also diffuse opacity in the frontal sinuses bilaterally. No intraorbital lesions evident. IMPRESSION: Mild diffuse atrophy for age. No intracranial mass, hemorrhage, or focal gray - white compartment lesion. Extensive mastoid air cell disease bilaterally as well as multifocal paranasal sinus disease. Electronically Signed   By: Lowella Grip III M.D.   On: 12/10/2015 18:06   US Renal  12/12/2015  CLINICAL DATA:  Acute kidney injury. EXAM: RENAL / URINARY TRACT ULTRASOUND COMPLETE COMPARISON:  04/16/2013 FINDINGS: Right Kidney: Length: 12.3 cm. Diffusely increased echotexture. No mass or hydronephrosis. Left Kidney: Length: 13.3 cm. 6.2 cm cyst in the mid to lower pole. Area of calcifications noted along the posterior wall as seen on prior CT. Increased echotexture throughout the left kidney. No hydronephrosis. Bladder: Foley catheter in place, decompressed. IMPRESSION: Increased echotexture within the kidneys bilaterally compatible chronic medical renal disease. 6.2 cm minimally complex cyst in the left kidney. No hydronephrosis. Electronically Signed   By: Rolm Baptise M.D.   On: 12/12/2015 09:37    Scheduled Meds: . albuterol  2.5 mg Nebulization Q6H  . amantadine  100 mg Oral BID  . ARIPiprazole  15 mg Oral BH-qamhs  . benztropine  0.5 mg Oral BID  . budesonide (PULMICORT) nebulizer solution  0.25 mg Nebulization BID  . divalproex  500 mg Oral [id number]  . divalproex  750 mg Oral QHS  . enoxaparin (LOVENOX) injection  30 mg Subcutaneous Q24H  . feeding supplement (ENSURE ENLIVE)  237 mL Oral BID BM  . guaiFENesin  600 mg Oral BID  . haloperidol  5 mg Oral BID  . insulin aspart  0-9 Units Subcutaneous TID WC  . levofloxacin (LEVAQUIN) IV  500 mg Intravenous Q48H  . levothyroxine  37.5 mcg Intravenous Daily  . sodium chloride  500 mL  Intravenous Once  . sodium chloride flush  3 mL Intravenous Q12H  . tiotropium  18 mcg Inhalation Daily   Continuous Infusions: . sodium chloride 125 mL/hr at 12/12/15 1346  .  sodium bicarbonate infusion 1/4 NS 1000 mL 50 mL/hr at 12/12/15 1245    Active Problems:   HTN (hypertension)   COPD (chronic obstructive pulmonary disease) (HCC)   Chronic kidney disease   Bipolar disorder, current episode manic severe with psychotic features ([place])   Prediabetes   COPD with acute exacerbation ([place])  HCAP (healthcare-associated pneumonia)    Time spent: 6 minutes    Kelvin Cellar  [facility] Pager [phone]. If 7PM-7AM, please contact night-coverage at [email or web address], password [email or web address] [facility] 12/12/2015, 3:50 PM  LOS: 4 days

## 2015-12-12 NOTE — Progress Notes (Signed)
PT Cancellation Note  Patient Details Name: [name] MRN: [id number] DOB: [birth year]   Cancelled Treatment:    Reason Eval/Treat Not Completed: Patient not medically ready. 3 cancels in a row. Pt still not medically ready to work with PT. Will sign off. Please reorder once pt is more appropriate Thanks    [name], MPT Pager: [phone]

## 2015-12-13 ENCOUNTER — Inpatient Hospital Stay (HOSPITAL_COMMUNITY): Payer: Medicare Other

## 2015-12-13 DIAGNOSIS — F312 Bipolar disorder, current episode manic severe with psychotic features: Secondary | ICD-10-CM

## 2015-12-13 DIAGNOSIS — N19 Unspecified kidney failure: Secondary | ICD-10-CM | POA: Diagnosis present

## 2015-12-13 DIAGNOSIS — N189 Chronic kidney disease, unspecified: Secondary | ICD-10-CM

## 2015-12-13 DIAGNOSIS — E875 Hyperkalemia: Secondary | ICD-10-CM

## 2015-12-13 DIAGNOSIS — N179 Acute kidney failure, unspecified: Secondary | ICD-10-CM

## 2015-12-13 LAB — POCT I-STAT 3, ART BLOOD GAS (G3+)
ACID-BASE DEFICIT: 6 mmol/L — AB (ref 0.0–2.0)
Bicarbonate: 22.5 mEq/L (ref 20.0–24.0)
O2 SAT: 100 %
PH ART: 7.187 — AB (ref 7.350–7.450)
Patient temperature: 97.5
TCO2: 24 mmol/L (ref 0–100)
pCO2 arterial: 58.8 mmHg (ref 35.0–45.0)
pO2, Arterial: 238 mmHg — ABNORMAL HIGH (ref 80.0–100.0)

## 2015-12-13 LAB — RENAL FUNCTION PANEL
ALBUMIN: 2 g/dL — AB (ref 3.5–5.0)
Anion gap: 9 (ref 5–15)
BUN: 145 mg/dL — AB (ref 6–20)
CALCIUM: 9.3 mg/dL (ref 8.9–10.3)
CO2: 21 mmol/L — ABNORMAL LOW (ref 22–32)
Chloride: 126 mmol/L — ABNORMAL HIGH (ref 101–111)
Creatinine, Ser: 5.21 mg/dL — ABNORMAL HIGH (ref 0.44–1.00)
GFR calc Af Amer: 10 mL/min — ABNORMAL LOW (ref >60)
GFR, EST NON AFRICAN AMERICAN: 8 mL/min — AB (ref >60)
Glucose, Bld: 109 mg/dL — ABNORMAL HIGH (ref 65–99)
PHOSPHORUS: 8.7 mg/dL — AB (ref 2.5–4.6)
POTASSIUM: 6.6 mmol/L — AB (ref 3.5–5.1)
SODIUM: 156 mmol/L — AB (ref 135–145)

## 2015-12-13 LAB — CBC
HEMATOCRIT: 37.4 % (ref 36.0–46.0)
HEMOGLOBIN: 11 g/dL — AB (ref 12.0–15.0)
MCH: 32.8 pg (ref 26.0–34.0)
MCHC: 29.4 g/dL — ABNORMAL LOW (ref 30.0–36.0)
MCV: 111.6 fL — ABNORMAL HIGH (ref 78.0–100.0)
Platelets: 122 10*3/uL — ABNORMAL LOW (ref 150–400)
RBC: 3.35 MIL/uL — ABNORMAL LOW (ref 3.87–5.11)
RDW: 15.1 % (ref 11.5–15.5)
WBC: 16.8 10*3/uL — AB (ref 4.0–10.5)

## 2015-12-13 LAB — BASIC METABOLIC PANEL
Anion gap: 10 (ref 5–15)
BUN: 157 mg/dL — AB (ref 6–20)
CO2: 20 mmol/L — ABNORMAL LOW (ref 22–32)
CREATININE: 5.34 mg/dL — AB (ref 0.44–1.00)
Calcium: 8.8 mg/dL — ABNORMAL LOW (ref 8.9–10.3)
Chloride: 121 mmol/L — ABNORMAL HIGH (ref 101–111)
GFR, EST AFRICAN AMERICAN: 9 mL/min — AB (ref >60)
GFR, EST NON AFRICAN AMERICAN: 8 mL/min — AB (ref >60)
Glucose, Bld: 158 mg/dL — ABNORMAL HIGH (ref 65–99)
POTASSIUM: 6.2 mmol/L — AB (ref 3.5–5.1)
SODIUM: 151 mmol/L — AB (ref 135–145)

## 2015-12-13 LAB — CULTURE, BLOOD (ROUTINE X 2)
CULTURE: NO GROWTH
Culture: NO GROWTH

## 2015-12-13 LAB — HCV COMMENT:

## 2015-12-13 LAB — TSH: TSH: 4.541 u[IU]/mL — AB (ref 0.350–4.500)

## 2015-12-13 LAB — EXPECTORATED SPUTUM ASSESSMENT W REFEX TO RESP CULTURE

## 2015-12-13 LAB — PARATHYROID HORMONE, INTACT (NO CA): PTH: 112 pg/mL — ABNORMAL HIGH (ref 15–65)

## 2015-12-13 LAB — HEPATITIS B CORE ANTIBODY, IGM: HEP B C IGM: NEGATIVE

## 2015-12-13 LAB — HEPATITIS C ANTIBODY (REFLEX): HCV Ab: <0.1 s/co ratio (ref 0.0–0.9)

## 2015-12-13 LAB — HEPATITIS B SURFACE ANTIGEN: HEP B S AG: NEGATIVE

## 2015-12-13 LAB — GLUCOSE, CAPILLARY
GLUCOSE-CAPILLARY: 126 mg/dL — AB (ref 65–99)
Glucose-Capillary: 99 mg/dL (ref 65–99)
Glucose-Capillary: 90 mg/dL (ref 65–99)

## 2015-12-13 LAB — EXPECTORATED SPUTUM ASSESSMENT W GRAM STAIN, RFLX TO RESP C

## 2015-12-13 LAB — HEPATITIS B SURFACE ANTIBODY,QUALITATIVE: HEP B S AB: NONREACTIVE

## 2015-12-13 LAB — VALPROIC ACID LEVEL: Valproic Acid Lvl: 27 ug/mL — ABNORMAL LOW (ref 50.0–100.0)

## 2015-12-13 LAB — T4, FREE: Free T4: 0.85 ng/dL (ref 0.61–1.12)

## 2015-12-13 MED ORDER — SODIUM CHLORIDE 0.9 % IV SOLN
25.0000 ug/h | INTRAVENOUS | Status: DC
  Administered 2015-12-13 – 2015-12-14 (×2): 100 ug/h via INTRAVENOUS
  Filled 2015-12-13 (×2): qty 50

## 2015-12-13 MED ORDER — HEPARIN SODIUM (PORCINE) 1000 UNIT/ML DIALYSIS
1000.0000 [IU] | Freq: Once | INTRAMUSCULAR | Status: AC
  Administered 2015-12-13: 2400 [IU] via INTRAVENOUS_CENTRAL
  Filled 2015-12-13: qty 6

## 2015-12-13 MED ORDER — ACETAMINOPHEN 160 MG/5ML PO SOLN: 650.0000 mg | Freq: Four times a day (QID) | ORAL | Status: DC | PRN

## 2015-12-13 MED ORDER — VALPROATE SODIUM 250 MG/5ML PO SYRP
500.0000 mg | ORAL_SOLUTION | Freq: Two times a day (BID) | ORAL | Status: DC
  Administered 2015-12-14 – 2015-12-17 (×7): 500 mg via ORAL
  Filled 2015-12-13 (×9): qty 10

## 2015-12-13 MED ORDER — MIDAZOLAM HCL 2 MG/2ML IJ SOLN: 2.0000 mg | INTRAMUSCULAR | Status: DC | PRN

## 2015-12-13 MED ORDER — SODIUM POLYSTYRENE SULFONATE 15 GM/60ML PO SUSP
60.0000 g | Freq: Once | ORAL | Status: AC
  Administered 2015-12-13: 60 g via ORAL
  Filled 2015-12-13: qty 240

## 2015-12-13 MED ORDER — HEPARIN 1000 UNIT/ML FOR PERITONEAL DIALYSIS
1200.0000 [IU] | Freq: Once | INTRAMUSCULAR | Status: DC
  Filled 2015-12-13: qty 1.2

## 2015-12-13 MED ORDER — BISACODYL 10 MG RE SUPP: 10.0000 mg | Freq: Every day | RECTAL | Status: DC | PRN

## 2015-12-13 MED ORDER — MIDAZOLAM HCL 2 MG/2ML IJ SOLN
2.0000 mg | INTRAMUSCULAR | Status: DC | PRN
  Administered 2015-12-13: 2 mg via INTRAVENOUS

## 2015-12-13 MED ORDER — AMANTADINE HCL 50 MG/5ML PO SYRP
100.0000 mg | ORAL_SOLUTION | Freq: Two times a day (BID) | ORAL | Status: DC
  Administered 2015-12-13 – 2015-12-25 (×24): 100 mg via ORAL
  Filled 2015-12-13 (×26): qty 10

## 2015-12-13 MED ORDER — MIDAZOLAM HCL 2 MG/2ML IJ SOLN
2.0000 mg | INTRAMUSCULAR | Status: DC | PRN
  Filled 2015-12-13: qty 2

## 2015-12-13 MED ORDER — GUAIFENESIN 100 MG/5ML PO SOLN
15.0000 mL | Freq: Four times a day (QID) | ORAL | Status: DC
  Administered 2015-12-13 – 2015-12-25 (×45): 300 mg via ORAL
  Filled 2015-12-13 (×7): qty 15
  Filled 2015-12-13: qty 5
  Filled 2015-12-13 (×7): qty 15
  Filled 2015-12-13: qty 5
  Filled 2015-12-13 (×2): qty 15
  Filled 2015-12-13: qty 5
  Filled 2015-12-13 (×5): qty 15
  Filled 2015-12-13: qty 5
  Filled 2015-12-13 (×7): qty 15
  Filled 2015-12-13: qty 5
  Filled 2015-12-13: qty 15
  Filled 2015-12-13: qty 5
  Filled 2015-12-13 (×8): qty 15
  Filled 2015-12-13 (×2): qty 10
  Filled 2015-12-13: qty 5
  Filled 2015-12-13 (×6): qty 15
  Filled 2015-12-13: qty 5

## 2015-12-13 MED ORDER — SODIUM CHLORIDE 0.9% FLUSH
3.0000 mL | Freq: Two times a day (BID) | INTRAVENOUS | Status: DC
  Administered 2015-12-15 – 2015-12-24 (×7): 3 mL via INTRAVENOUS

## 2015-12-13 MED ORDER — FENTANYL CITRATE (PF) 100 MCG/2ML IJ SOLN
100.0000 ug | INTRAMUSCULAR | Status: DC | PRN
  Administered 2015-12-16: 100 ug via INTRAVENOUS
  Filled 2015-12-13: qty 2

## 2015-12-13 MED ORDER — VANCOMYCIN HCL 10 G IV SOLR
1750.0000 mg | Freq: Once | INTRAVENOUS | Status: AC
  Administered 2015-12-13: 1750 mg via INTRAVENOUS
  Filled 2015-12-13: qty 1750

## 2015-12-13 MED ORDER — DEXTROSE 5 % IV SOLN
INTRAVENOUS | Status: DC
  Administered 2015-12-13 – 2015-12-14 (×2): via INTRAVENOUS
  Administered 2015-12-15: 50 mL via INTRAVENOUS
  Administered 2015-12-16 – 2015-12-17 (×2): via INTRAVENOUS
  Administered 2015-12-18: 100 mL via INTRAVENOUS
  Administered 2015-12-18 – 2015-12-21 (×6): via INTRAVENOUS

## 2015-12-13 MED ORDER — FENTANYL CITRATE (PF) 100 MCG/2ML IJ SOLN
100.0000 ug | Freq: Once | INTRAMUSCULAR | Status: AC
Start: 2015-12-13 — End: 2015-12-13
  Administered 2015-12-13: 100 ug via INTRAVENOUS

## 2015-12-13 MED ORDER — NOREPINEPHRINE BITARTRATE 1 MG/ML IV SOLN
2.0000 ug/min | INTRAVENOUS | Status: DC
  Administered 2015-12-13: 2 ug/min via INTRAVENOUS
  Administered 2015-12-14: 4 ug/min via INTRAVENOUS
  Administered 2015-12-15: 3 ug/min via INTRAVENOUS
  Filled 2015-12-13 (×2): qty 4

## 2015-12-13 MED ORDER — SODIUM CHLORIDE 0.9 % IV SOLN
1.0000 g | Freq: Once | INTRAVENOUS | Status: AC
  Administered 2015-12-13: 1 g via INTRAVENOUS
  Filled 2015-12-13: qty 10

## 2015-12-13 MED ORDER — VALPROATE SODIUM 250 MG/5ML PO SYRP
750.0000 mg | ORAL_SOLUTION | Freq: Every day | ORAL | Status: DC
Start: 2015-12-13 — End: 2015-12-17
  Administered 2015-12-13 – 2015-12-16 (×4): 750 mg via ORAL
  Filled 2015-12-13 (×4): qty 15

## 2015-12-13 MED ORDER — PANTOPRAZOLE SODIUM 40 MG PO PACK
40.0000 mg | PACK | Freq: Every day | ORAL | Status: DC
  Administered 2015-12-14 – 2015-12-24 (×11): 40 mg
  Filled 2015-12-13 (×11): qty 20

## 2015-12-13 MED ORDER — FENTANYL CITRATE (PF) 100 MCG/2ML IJ SOLN
50.0000 ug | Freq: Once | INTRAMUSCULAR | Status: DC
  Filled 2015-12-13: qty 2

## 2015-12-13 MED ORDER — MIDAZOLAM HCL 2 MG/2ML IJ SOLN
2.0000 mg | Freq: Once | INTRAMUSCULAR | Status: AC
  Administered 2015-12-13: 2 mg via INTRAVENOUS

## 2015-12-13 MED ORDER — ROCURONIUM BROMIDE 50 MG/5ML IV SOLN
50.0000 mg | Freq: Once | INTRAVENOUS | Status: AC
  Administered 2015-12-13: 50 mg via INTRAVENOUS

## 2015-12-13 MED ORDER — FENTANYL BOLUS VIA INFUSION
50.0000 ug | INTRAVENOUS | Status: DC | PRN
  Filled 2015-12-13: qty 50

## 2015-12-13 MED ORDER — INSULIN ASPART 100 UNIT/ML ~~LOC~~ SOLN
0.0000 [IU] | SUBCUTANEOUS | Status: DC
  Administered 2015-12-14: 2 [IU] via SUBCUTANEOUS
  Administered 2015-12-14: 1 [IU] via SUBCUTANEOUS
  Administered 2015-12-14 (×2): 2 [IU] via SUBCUTANEOUS

## 2015-12-13 MED ORDER — SODIUM POLYSTYRENE SULFONATE 15 GM/60ML PO SUSP
60.0000 g | Freq: Once | ORAL | Status: AC
  Administered 2015-12-13: 60 g via RECTAL
  Filled 2015-12-13: qty 240

## 2015-12-13 MED ORDER — DOCUSATE SODIUM 50 MG/5ML PO LIQD
100.0000 mg | Freq: Two times a day (BID) | ORAL | Status: DC | PRN
  Filled 2015-12-13: qty 10

## 2015-12-13 MED ORDER — FENTANYL CITRATE (PF) 100 MCG/2ML IJ SOLN: 100.0000 ug | INTRAMUSCULAR | Status: DC | PRN

## 2015-12-13 NOTE — Progress Notes (Signed)
Pharmacy Antibiotic Note  [name] is a 55 y.o. female admitted on 12/08/2015 with sepsis secondary to HCAP. 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 Pt is on day #6 of Levaquin. All cultures have been ngtd so far.  Plan: -Vancomycin 1750 mg IV x1 -F/u renal plans for additional vanc dosing -F/u cultures, VR as needed.  Height: 5\' 7"  (170.2 cm) Weight: 190 lb (86.183 kg) IBW/kg (Calculated) : 61.6  Temp (24hrs), Avg:97.7 F (36.5 C), Min:97.5 F (36.4 C), Max:98 F (36.7 C)   Recent Labs Lab 12/08/15 1221 12/08/15 1533 12/08/15 1606 12/08/15 1655 12/09/15 0516 12/10/15 0500 12/11/15 0507 12/12/15 0512 12/13/15 0504 12/13/15 0509  WBC  --   --   --   --  12.7* 16.3* 10.2 15.4* 16.8*  --   CREATININE  --   --   --   --  4.30* 4.11* 3.92* 4.15*  --  5.21*  LATICACIDVEN 1.10 0.78 0.9 0.8  --   --   --   --   --   --     Estimated Creatinine Clearance: 13.4 mL/min (by C-G formula based on Cr of 5.21).    Allergies  Allergen Reactions  . Doxycycline Other (See Comments)    REACTION: Unknown reaction  . Glipizide Other (See Comments)    unknown  . Ibuprofen Other (See Comments)    CANNOT TAKE IBUPROFEN OR MOTRIN DUE TO CURRENT MEDS  . Penicillins Other (See Comments)    Has patient had a PCN reaction causing immediate rash, facial/tongue/throat swelling, SOB or lightheadedness with hypotension: unknown Has patient had a PCN reaction causing severe rash involving mucus membranes or skin necrosis: unknown Has patient had a PCN reaction that required hospitalization Unknown Has patient had a PCN reaction occurring within the last 10 years: Unknown If all of the above answers are "NO", then may proceed with Cephalosporin use.   . Sulfonamide Derivatives     REACTION: Unknown reaction  . Tetracycline Other (See Comments)    REACTION: Unknown reaction  . Tolterodine Tartrate Itching  . Chlorostat [Chlorhexidine] Rash  . Cogentin [Benztropine] Rash  . Oxybutynin  Chloride Itching and Rash    Antimicrobials this admission:  4/10 >> Levofloxacin >> 4/10 >> Vancomycin >>4/11 4/10 >> Primaxin >>4/11  Dose adjustments this admission: NA  Microbiology results: 4/10 BCx x2:  4/10 UCx: insignif. Growth FINAL 4/10 Influenza panel (-) 4/10 Strep pneumo urinary antigen (-) 4/10 Legionella urinary antigen: ordered 4/10 MRSA PCR (-)  Thank you for allowing pharmacy to be a part of this patient's care.  Harvel Quale 12/13/2015 5:18 PM

## 2015-12-13 NOTE — Progress Notes (Signed)
Report given to [facility]. 

## 2015-12-13 NOTE — Procedures (Signed)
Intubation Procedure Note [name] [id number] 04-15-1961  Procedure: Intubation Indications: Respiratory insufficiency  Procedure Details Consent: Unable to obtain consent because of altered level of consciousness. Time Out: Verified patient identification, verified procedure, site/side was marked, verified correct patient position, special equipment/implants available, medications/allergies/relevent history reviewed, required imaging and test results available.  Performed  MAC and 3 Medications:  Fentanyl 100 mcg Versed 2 mg NMB    Evaluation Hemodynamic Status: Transient hypotension treated with fluid; O2 sats: stable throughout Patient's Current Condition: stable Complications: No apparent complications Patient did tolerate procedure well. Chest X-ray ordered to verify placement.  CXR: pending.   Richardson Landry Minor ACNP Maryanna Shape PCCM Pager [phone] till 3 pm If no answer page [phone] 12/13/2015, 4:38 PM  Baltazar Apo, MD, PhD 12/13/2015, 4:55 PM [facility] [phone] or if no answer [phone]

## 2015-12-13 NOTE — Progress Notes (Signed)
Attempted to call [facility] stepdown unit to give report, was instructed that nurse Colletta [place]) would have to call me back

## 2015-12-13 NOTE — Consult Note (Signed)
PULMONARY / CRITICAL CARE MEDICINE   Name: [name] MRN: [id number] DOB: 07-07-1961    ADMISSION DATE:  12/08/2015 CONSULTATION DATE:  4/15  REFERRING MD:  Deterding  CHIEF COMPLAINT:  Renal failure  HISTORY OF PRESENT ILLNESS:   55 yo bipolar smoke admitted /10 with weakness and cough. Her renal function declined and Renal moved her to [facility] for PCCM to place HD cath. She is not competent to sign for procedure and is estranged from her family due to life long mental illness.  She is unable to cooperate for HD insertion therefore she will be moved to ICU, sedated \, intubated and HD cath attempted. Hopefully she will be liberatd from vent and returned to SDU with in 24 hours.  PAST MEDICAL HISTORY :  She  has a past medical history of Anemia (07/10/2011); Thrombocytopenia ([place]) (07/10/2011); HTN (hypertension); COPD (chronic obstructive pulmonary disease) ([place]); Hyperlipidemia; Hypothyroidism; Bipolar affective ([place]); DM (diabetes mellitus) ([place]); Renal insufficiency; Cancer [facility]) (2012); and Hypertension.  PAST SURGICAL HISTORY: She  has past surgical history that includes Ankle surgery (Left, 1989); Tonsillectomy (AGE 63); Breast surgery (Right, 2010); Ventral hernia repair (N/A, 06/06/2013); and Insertion of mesh (N/A, 06/06/2013).  Allergies  Allergen Reactions  . Doxycycline Other (See Comments)    REACTION: Unknown reaction  . Glipizide Other (See Comments)    unknown  . Ibuprofen Other (See Comments)    CANNOT TAKE IBUPROFEN OR MOTRIN DUE TO CURRENT MEDS  . Penicillins Other (See Comments)    Has patient had a PCN reaction causing immediate rash, facial/tongue/throat swelling, SOB or lightheadedness with hypotension: unknown Has patient had a PCN reaction causing severe rash involving mucus membranes or skin necrosis: unknown Has patient had a PCN reaction that required hospitalization Unknown Has patient had a PCN reaction occurring within the last 10 years: Unknown If  all of the above answers are "NO", then may proceed with Cephalosporin use.   . Sulfonamide Derivatives     REACTION: Unknown reaction  . Tetracycline Other (See Comments)    REACTION: Unknown reaction  . Tolterodine Tartrate Itching  . Chlorostat [Chlorhexidine] Rash  . Cogentin [Benztropine] Rash  . Oxybutynin Chloride Itching and Rash    No current facility-administered medications on file prior to encounter.   Current Outpatient Prescriptions on File Prior to Encounter  Medication Sig  . acetaminophen (TYLENOL) 500 MG tablet Take 1 tablet (500 mg total) by mouth every 6 (six) hours as needed.  Marland Kitchen albuterol (PROVENTIL HFA;VENTOLIN HFA) 108 (90 BASE) MCG/ACT inhaler Inhale 1-2 puffs into the lungs every 6 (six) hours as needed for wheezing or shortness of breath.  . ARIPiprazole (ABILIFY) 15 MG tablet Take 1 tablet (15 mg total) by mouth 2 (two) times daily in the am and at bedtime..  . ARIPiprazole 400 MG SUSR Inject 400 mg into the muscle every 28 (twenty-eight) days. To be given at office.  Marland Kitchen atorvastatin (LIPITOR) 40 MG tablet Take 1 tablet (40 mg total) by mouth at bedtime.  . benztropine (COGENTIN) 0.5 MG tablet Take 1 tablet (0.5 mg total) by mouth 2 (two) times daily.  . carvedilol (COREG) 3.125 MG tablet Take 1 tablet (3.125 mg total) by mouth every morning.  . divalproex (DEPAKOTE) 250 MG DR tablet Take 3 tablets (750 mg total) by mouth at bedtime.  . divalproex (DEPAKOTE) 500 MG DR tablet Take 1 tablet (500 mg total) by mouth 2 (two) times daily at 8am and 3pm. (Patient taking differently: Take 1,000 mg by mouth  2 (two) times daily. )  . Fluticasone-Salmeterol (ADVAIR) 250-50 MCG/DOSE AEPB Inhale 1 puff into the lungs 2 (two) times daily.  Marland Kitchen gabapentin (NEURONTIN) 400 MG capsule Take 1 capsule (400 mg total) by mouth 3 (three) times daily at 8am, 3pm and bedtime.  . hydrocortisone cream 1 % Apply topically 3 (three) times daily.  . hydrOXYzine (ATARAX/VISTARIL) 25 MG tablet  Take 1 tablet (25 mg total) by mouth 2 (two) times daily as needed for anxiety.  Marland Kitchen levothyroxine (SYNTHROID, LEVOTHROID) 75 MCG tablet Take 1 tablet (75 mcg total) by mouth daily before breakfast.  . Multiple Vitamin (MULTIVITAMIN WITH MINERALS) TABS tablet Take 1 tablet by mouth daily.  Marland Kitchen nystatin ointment (MYCOSTATIN) Apply topically 2 (two) times daily.  . ramipril (ALTACE) 2.5 MG capsule Take 1 capsule (2.5 mg total) by mouth every morning.  . zolpidem (AMBIEN) 10 MG tablet Take 1 tablet (10 mg total) by mouth at bedtime.    FAMILY HISTORY:  Her indicated that her mother is deceased. She indicated that her father is deceased. She indicated that only one of her three brothers is alive.   SOCIAL HISTORY: She  reports that she has been smoking Cigarettes and Cigars.  She has a 20 pack-year smoking history. She has never used smokeless tobacco. She reports that she does not drink alcohol or use illicit drugs.  REVIEW OF SYSTEMS:   NA  SUBJECTIVE:  Confused wf  VITAL SIGNS: BP 92/47 mmHg  Pulse 93  Temp(Src) 97.5 F (36.4 C) (Axillary)  Resp 20  Ht 5\' 5"  (1.651 m)  Wt 190 lb (86.183 kg)  BMI 31.62 kg/m2  SpO2 94%  HEMODYNAMICS:    VENTILATOR SETTINGS:    INTAKE / OUTPUT: I/O last 3 completed shifts: In: 750 [I.V.:300; Other:350; IV Piggyback:100] Out: 500 [Urine:500]  PHYSICAL EXAMINATION: General:  Dishevel wf who isuncooperative  Neuro:  Agitated and uncooperative when not aslep HEENT: Poor dentition , no neck, no jvd Cardiovascular:  hsd rrr Lungs: decreased bases, + rhonchi Abdomen: obese +bs Musculoskeletal:  intact Skin:  dry  LABS:  BMET  Recent Labs Lab 12/11/15 0507 12/12/15 0512 12/13/15 0509  NA 147* 154* 156*  K 5.3* 5.5* 6.6*  CL 118* 127* 126*  CO2 19* 17* 21*  BUN 116* 127* 145*  CREATININE 3.92* 4.15* 5.21*  GLUCOSE 193* 139* 109*    Electrolytes  Recent Labs Lab 12/08/15 1606  12/11/15 0507 12/12/15 0512 12/13/15 0509   CALCIUM  --   < > 9.7 10.4* 9.3  MG 1.9  --   --   --   --   PHOS  --   --   --   --  8.7*  < > = values in this interval not displayed.  CBC  Recent Labs Lab 12/11/15 0507 12/12/15 0512 12/13/15 0504  WBC 10.2 15.4* 16.8*  HGB 10.9* 11.6* 11.0*  HCT 34.9* 36.7 37.4  PLT 127* 160 122*    Coag's  Recent Labs Lab 12/08/15 1530  APTT 32  INR 1.23    Sepsis Markers  Recent Labs Lab 12/08/15 1530 12/08/15 1533 12/08/15 1606 12/08/15 1655  LATICACIDVEN  --  0.78 0.9 0.8  PROCALCITON 1.12  --   --   --     ABG No results for input(s): PHART, PCO2ART, PO2ART in the last 168 hours.  Liver Enzymes  Recent Labs Lab 12/08/15 1530 12/09/15 0516 12/13/15 0509  AST 85* 66*  --   ALT 52 48  --  ALKPHOS 112 94  --   BILITOT 0.8 0.4  --   ALBUMIN 2.3* 2.0* 2.0*    Cardiac Enzymes  Recent Labs Lab 12/08/15 1530 12/08/15 2125 12/09/15 0516  TROPONINI 0.03 0.03 0.03    Glucose  Recent Labs Lab 12/12/15 0743 12/12/15 1135 12/12/15 1704 12/12/15 2054 12/13/15 0735 12/13/15 1111  GLUCAP 117* 114* 93 78 99 90    Imaging No results found.   STUDIES:    CULTURES: Blood 4/10 >> Urine 4/10 >>  Sputum 4/15 >>  ANTIBIOTICS: 4/11 levaquin>>  SIGNIFICANT EVENTS: 4/15 transfer to [facility]   LINES/TUBES: 4/15 rt i j hd cath>>  DISCUSSION: 55 yo, hx bipolar with acute on chronic renal failure  ASSESSMENT / PLAN:  PULMONARY A: COPD without active wheezing Current smoker Intubate to place HD cath P:   BD's Wean from vent quickly if possible  CARDIOVASCULAR A:  CAD P:  Telemetry Follow hemodynamics  RENAL Lab Results  Component Value Date   CREATININE 5.21* 12/13/2015   CREATININE 4.15* 12/12/2015   CREATININE 3.92* 12/11/2015   CREATININE 2.3* 04/10/2015   CREATININE 1.8* 05/07/2013   CREATININE 1.96* 04/13/2013   CREATININE 1.9* 05/04/2012    Recent Labs Lab 12/11/15 0507 12/12/15 0512 12/13/15 0509  K 5.3* 5.5* 6.6*   A:   Acute on chronic renal failure P:   Place HD cath if possible under sedation RRT per renal  GASTROINTESTINAL A:   GI protection P:   PPI  HEMATOLOGIC A:   DVT protection P:  LMWH  INFECTIOUS A:   Suspected CAP  P:   4/12 levaquin Follow CXR and clinical response to therapy  ENDOCRINE A:   DM 2 P:   SSI  NEUROLOGIC A:   Metabolic encephalopathy Bipolar schizoaffective paranoid personality disorder Intubate for HD cath palcement P:   RASS goal: -1  PAD protocol    FAMILY  - Updates: Estranged from family. Brother has distanced himself from her as confirmed by Dr Jimmy Footman 4/15. She has no guardian to my knowledge. This is a difficult situation because she will need to be sedated and intubated to tolerate HD catheter placement.  It is also true that life support in this chronically debilitated woman may not be appropriate (or consistent with her wishes). She is unable to participate in decision making at this time. Because there is no proxy, and because we may be dealing with a reversible acute problem, I believe we should proceed with intubation and line placement for dialysis. I acknowledge that she may not be able to be successfully extubated due to her medical and psychological illnesses.   - Inter-disciplinary family meet or Palliative Care meeting due by:  day 7   [facility] Minor ACNP Maryanna Shape PCCM Pager [phone] till 3 pm If no answer page [phone] 12/13/2015, 3:32 PM  Attending Note:  I have examined patient, reviewed labs, studies and notes. I have discussed the case with S Minor, and I agree with the data and plans as amended above. 55 yo woman, debilitated due to schizoaffective d/o and bipolar d/o. Also w hx chronic renal failure Stage 3. She was admitted with altered MS and dyspnea, suspected CAP treated with levaquin. She developed acute on chronic renal failure associated with hyperkalemia in setting NSAIDS, ACE-I.  On my evaluation she is  agitated, cannot carry on a conversation, is confused. She can move all ext with good strength. I do not believe she can safely get HD catheter unless she is sedated.  As above, This is a difficult situation because she will need to be sedated and intubated to tolerate HD catheter placement.  It is also true that life support in this chronically debilitated woman may not be appropriate (or consistent with her wishes). She is unable to participate in decision making at this time. Because there is no proxy, and because we may be dealing with a reversible acute problem, I believe we should proceed with intubation and line placement for dialysis. I acknowledge that she may not be able to be successfully extubated due to her medical and psychological illnesses. We will move her to ICU for ETT and then HD catheter. Continue abx. HD once line in place.  Independent critical care time is 60 minutes.   Baltazar Apo, MD, PhD 12/13/2015, 4:34 PM [facility] [phone] or if no answer [phone]

## 2015-12-13 NOTE — Progress Notes (Signed)
eLink Physician-Brief Progress Note Patient Name: GENETA MUDGE DOB: 1961-06-05 MRN: [id number]   Date of Service  12/13/2015  HPI/Events of Note  acute resp acidosis  eICU Interventions  Increase RR 24     Intervention Category Major Interventions: Acid-Base disturbance - evaluation and management  ALVA,RAKESH V. 12/13/2015, 9:06 PM

## 2015-12-13 NOTE — Progress Notes (Addendum)
[facility] PROGRESS NOTE  [name] [id number] DOB: 11-May-1961 DOA: 12/08/2015 PCP: Benito Mccreedy, MD  Interim Summary [name] is a 55 year old female with a past medical history of stage III chronic kidney disease, bipolar disorder, having previous hospitalizations to inpatient psychiatry, who was admitted to the medicine service on 12/08/2015 when [name] presented with complaints of cough shortness of breath and weakness. Initial workup revealed an indwelling white count of 23,800 having elevated creatinine of 4.45. Chest x-ray showed a possible pneumonia. [name] was started on empiric antibiotic therapy with Levaquin along with systemic steroids for COPD exacerbation. On admission [name] was unable to provide history. [name] failed to show improvement of mental status changes/encephalopathy in the ensuing daysc. Daily BMP's did show some improvement with creatinine trending down from 4.3 to 3.9 with IV fluid resuscitation. Then on 12/12/2015 it appeared [name] had an upward trend and creatinine 4.15 with BUN of 127. At this point I called Dr Jimmy Footman of nephrology for assistance. He felt this could be related to ACE inhibitor therapy and NSAIDS that [name] had been on prior to this hospitalization. On 12/13/2015 lab work showing a potassium of 6.6 having deterioration in kidney function with creatinine 5.2 with BUN of 145. Nephrology recommended [name] be transferred to [facility] stepdown unit to undergo RRT. Case was discussed with Dr. Sherral Hammers who agreed to accept patient in transfer.                                                                 Assessment/Plan:  Acute kidney injury superimposed in CKD (stage 3-4 at baseline) -Initially it was suspected that renal failure was secondary to ACE inhibitor therapy and possibly prerenal azotemia, however, Creatinine now trending up from 3.9-4.15 on a.m. lab work on 12/12/2015.  -Bilateral renal ultrasound did not reveal evidence of  hydronephrosis. -Repeat urinalysis unremarkable -A Foley catheter was placed for accurate measurement of in and out's -Nephrology was consulted on 12/12/2015.  -On 12/13/2015 labs showing upward trend in her creatinine to 5.2 with BUN of 145. [name] had a potassium 6.6. Also [name] has had a urinary output of 500 mL's in the past 24 hours. Case discussed with Dr. Jimmy Footman who recommended that [name] be transferred to [facility] stepdown unit to undergo RRT.  -Case was signed out to Dr. Sherral Hammers  Hyperkalemia -Lab work showing potassium of 6.6 -This is likely related to deterioration in renal function. -Overnight [name] was given Kayexalate. Case discussed nephrology who recommended NG tube placement -Will obtain EKG, awaiting repeat BMP -[name] will likely undergo RRT  Acute encephalopathy -On my evaluation [name] encephalopathic, confused, disoriented, unable to follow commands for me. -Unclear etiology,  include withdrawal, infection, medication induced, psychiatric disorder -During this hospitalization [name] has received IV Ativan -CT scan of brain that revealed intracranial mass, hemorrhage, lesions. Chest x-ray showing improvement to atelectasis versus infiltrates involving bases. -Attempted to get a hold of family members and a group home to get an idea of her baseline status. Multiple calls were placed to her group home unfortunately we have been successful in getting a hold of anybody. CM reported [name] spoke to her brother who stated that he was not involved in her life. -Case discussed with psychiatry and nephrology on 12/12/2015. There is the  possibility of psychiatric medications contributing particular with acute on chronic renal failure. I will allow psychiatry to assess her prior to making any changes to psychiatric medications. Dr Deterding did not feel this represented uremia.  COPD exacerbation -Suspect precipitated by infectious process as chest x-ray showed patchy bilateral lower  lobe pneumonia -Will discontinue IV steroids as [name] has shown improvement from a respiratory standpoint  Community acquire pneumonia.  -Patient presented with complaints of shortness of breath and cough with chest x-ray showing findings consistent with pneumonia. -Repeat chest x-ray on 12/11/2015 showing near resolution of bibasilar infiltrates -Will continue Levaquin 500 mg IV every 48 hours  Bipolar disorder: with psychotic features and recurrent manic episodes in recent past -Continue haloperidol 5 mg by mouth twice a day, Abilify 15 mg by mouth twice a day -[name] has had previous admissions to inpatient psychiatric facility -Case discussed with Dr. Lenna Sciara of psychiatry on 12/12/2015.   Code Status: Full Family Communication: no family at bedside  Disposition Plan [name]'ll likely require rehabilitation at skilled nursing facility   Consultants:  None   Procedures:  See below for x-ray reports   Antibiotics:  levaquin 4/11  HPI/Subjective: [name] remains agitated, not following commands  Objective: Filed Vitals:   12/12/15 2101 12/13/15 0538  BP: 120/74 92/47  Pulse: 110 93  Temp: 98 F (36.7 C) 97.5 F (36.4 C)  Resp: 22 20    Intake/Output Summary (Last 24 hours) at 12/13/15 1045 Last data filed at 12/13/15 0500  Gross per 24 hour  Intake    750 ml  Output    500 ml  Net    250 ml   Filed Weights   12/08/15 1546 12/08/15 1751  Weight: 86.183 kg (190 lb) 86.183 kg (190 lb)    Exam:   General:  [name] is agitated, restless, confused, disoriented, following commands, encephalopathic  Cardiovascular: S1 and S2, no rubs or gallops  Respiratory: diffuse wheezing and rhonchi, no crackles  Abdomen: soft, NT, positive BS  Musculoskeletal: trace edema bilaterally  Data Reviewed: Basic Metabolic Panel:  Recent Labs Lab 12/08/15 1606 12/09/15 0516 12/10/15 0500 12/11/15 0507 12/12/15 0512 12/13/15 0509  NA  --  139 144 147* 154* 156*  K  --  5.2* 5.7* 5.3*  5.5* 6.6*  CL  --  110 117* 118* 127* 126*  CO2  --  20* 17* 19* 17* 21*  GLUCOSE  --  191* 162* 193* 139* 109*  BUN  --  92* 100* 116* 127* 145*  CREATININE  --  4.30* 4.11* 3.92* 4.15* 5.21*  CALCIUM  --  9.7 10.0 9.7 10.4* 9.3  MG 1.9  --   --   --   --   --   PHOS  --   --   --   --   --  8.7*   Liver Function Tests:  Recent Labs Lab 12/08/15 1530 12/09/15 0516 12/13/15 0509  AST 85* 66*  --   ALT 52 48  --   ALKPHOS 112 94  --   BILITOT 0.8 0.4  --   PROT 7.1 6.6  --   ALBUMIN 2.3* 2.0* 2.0*   CBC:  Recent Labs Lab 12/09/15 0516 12/10/15 0500 12/11/15 0507 12/12/15 0512 12/13/15 0504  WBC 12.7* 16.3* 10.2 15.4* 16.8*  NEUTROABS  --   --   --  12.6*  --   HGB 10.9* 11.1* 10.9* 11.6* 11.0*  HCT 32.9* 34.3* 34.9* 36.7 37.4  MCV 98.5 101.2* 105.4* 104.6*  111.6*  PLT 136* 146* 127* 160 122*   Cardiac Enzymes:  Recent Labs Lab 12/08/15 1205 12/08/15 1530 12/08/15 2125 12/09/15 0516 12/12/15 1235  CKTOTAL  --   --   --   --  149  TROPONINI 0.08* 0.03 0.03 0.03  --    BNP (last 3 results)  Recent Labs  12/08/15 1205  BNP 556.7*   CBG:  Recent Labs Lab 12/12/15 0743 12/12/15 1135 12/12/15 1704 12/12/15 2054 12/13/15 0735  GLUCAP 117* 114* 93 78 99    Recent Results (from the past 240 hour(s))  Culture, blood (routine x 2) Call MD if unable to obtain prior to antibiotics being given     Status: None (Preliminary result)   Collection Time: 12/08/15  3:21 PM  Result Value Ref Range Status   Specimen Description BLOOD LEFT ANTECUBITAL  Final   Special Requests BOTTLES DRAWN AEROBIC AND ANAEROBIC 5CC  Final   Culture   Final    NO GROWTH 4 DAYS Performed at [facility]    Report Status PENDING  Incomplete  Culture, blood (routine x 2) Call MD if unable to obtain prior to antibiotics being given     Status: None (Preliminary result)   Collection Time: 12/08/15  4:06 PM  Result Value Ref Range Status   Specimen Description BLOOD LEFT  FOREARM  Final   Special Requests IN PEDIATRIC BOTTLE [place]  Final   Culture   Final    NO GROWTH 4 DAYS Performed at [facility]    Report Status PENDING  Incomplete  MRSA PCR Screening     Status: None   Collection Time: 12/08/15 10:52 PM  Result Value Ref Range Status   MRSA by PCR NEGATIVE NEGATIVE Final    Comment:        The GeneXpert MRSA Assay (FDA approved for NASAL specimens only), is one component of a comprehensive MRSA colonization surveillance program. It is not intended to diagnose MRSA infection nor to guide or monitor treatment for MRSA infections.   Urine culture     Status: Abnormal   Collection Time: 12/08/15 11:26 PM  Result Value Ref Range Status   Specimen Description URINE, CLEAN CATCH  Final   Special Requests NONE  Final   Culture (A)  Final    1,000 COLONIES/mL INSIGNIFICANT GROWTH Performed at [facility]    Report Status 12/10/2015 FINAL  Final     Studies: Dg Chest 2 View  12/11/2015  CLINICAL DATA:  Pneumonia. EXAM: CHEST  2 VIEW COMPARISON:  12/08/2015 and 12/09/2015 FINDINGS: Patient slightly rotated to the left. Lungs are hypoinflated with continued interval improvement in subtle opacification in the right base and mild persistent opacification of the left base. Findings likely represent resolving atelectasis or infection. No definite effusion. Cardiomediastinal silhouette and remainder of the exam is unchanged. IMPRESSION: Improving bibasilar opacification likely resolving infection or atelectasis. Electronically Signed   By: Marin Olp M.D.   On: 12/11/2015 14:47   US Renal  12/12/2015  CLINICAL DATA:  Acute kidney injury. EXAM: RENAL / URINARY TRACT ULTRASOUND COMPLETE COMPARISON:  04/16/2013 FINDINGS: Right Kidney: Length: 12.3 cm. Diffusely increased echotexture. No mass or hydronephrosis. Left Kidney: Length: 13.3 cm. 6.2 cm cyst in the mid to lower pole. Area of calcifications noted along the posterior wall as seen on  prior CT. Increased echotexture throughout the left kidney. No hydronephrosis. Bladder: Foley catheter in place, decompressed. IMPRESSION: Increased echotexture within the kidneys bilaterally compatible chronic medical  renal disease. 6.2 cm minimally complex cyst in the left kidney. No hydronephrosis. Electronically Signed   By: Rolm Baptise M.D.   On: 12/12/2015 09:37    Scheduled Meds: . albuterol  2.5 mg Nebulization Q6H  . amantadine  100 mg Oral BID  . ARIPiprazole  15 mg Oral BH-qamhs  . benztropine  0.5 mg Oral BID  . budesonide (PULMICORT) nebulizer solution  0.25 mg Nebulization BID  . divalproex  500 mg Oral [id number]  . divalproex  750 mg Oral QHS  . enoxaparin (LOVENOX) injection  30 mg Subcutaneous Q24H  . feeding supplement (ENSURE ENLIVE)  237 mL Oral BID BM  . guaiFENesin  600 mg Oral BID  . insulin aspart  0-9 Units Subcutaneous TID WC  . levofloxacin (LEVAQUIN) IV  500 mg Intravenous Q48H  . levothyroxine  37.5 mcg Intravenous Daily  . LORazepam  1 mg Intravenous Q6H  . sodium chloride  500 mL Intravenous Once  . sodium chloride flush  3 mL Intravenous Q12H  . sodium polystyrene  60 g Oral Once  . tiotropium  18 mcg Inhalation Daily   Continuous Infusions: . dextrose 50 mL/hr at 12/13/15 1028  .  sodium bicarbonate infusion 1/4 NS 1000 mL 50 mL/hr at 12/12/15 1245    Principal Problem:   Bipolar disorder, current episode manic severe with psychotic features ([place]) Active Problems:   HTN (hypertension)   COPD (chronic obstructive pulmonary disease) ([place])   Chronic kidney disease   Prediabetes   COPD with acute exacerbation ([place])   HCAP (healthcare-associated pneumonia)   Renal failure    Time spent: 35 minutes    Kelvin Cellar  [facility] Pager [phone]. If 7PM-7AM, please contact night-coverage at [email or web address], password [facility] 12/13/2015, 10:45 AM  LOS: 5 days

## 2015-12-13 NOTE — Procedures (Signed)
Hemodialysis Insertion Procedure Note [name] [id number] 12-17-1960  Procedure: Insertion of Hemodialysis Catheter Type: 3 port  Indications: Hemodialysis   Procedure Details Consent: Unable to obtain consent because of altered level of consciousness. Time Out: Verified patient identification, verified procedure, site/side was marked, verified correct patient position, special equipment/implants available, medications/allergies/relevent history reviewed, required imaging and test results available.  Performed  Maximum sterile technique was used including antiseptics, cap, gloves, gown, hand hygiene, mask and sheet. Skin prep: Chlorhexidine; local anesthetic administered A antimicrobial bonded/coated triple lumen catheter was placed in the right internal jugular vein using the Seldinger technique. Ultrasound guidance used.Yes.   Catheter placed to 16 cm. Blood aspirated via all 3 ports and then flushed x 3. Line sutured x 2 and dressing applied.  Evaluation Blood flow good Complications: No apparent complications Patient did tolerate procedure well. Chest X-ray ordered to verify placement.  CXR: pending.      Richardson Landry Minor ACNP Maryanna Shape PCCM Pager [phone] till 3 pm If no answer page [phone] 12/13/2015, 3:20 PM   Baltazar Apo, MD, PhD 12/14/2015, 8:46 AM [facility] [phone] or if no answer [phone]

## 2015-12-13 NOTE — Progress Notes (Signed)
Patient arrived to [facility] via [place] from [facility]. Patient on 5L/[place], responds to pain, skin with excoriation at buttocks, abdominal folds and labial folds d/t intense cleaning upon admission to [facility].  Milford Cage, RN

## 2015-12-13 NOTE — Progress Notes (Signed)
Subjective: Interval History: not retaining Kayex, no improvement in MS  Objective: Vital signs in last 24 hours: Temp:  [97.5 F (36.4 C)-98.1 F (36.7 C)] 97.5 F (36.4 C) (04/15 0538) Pulse Rate:  [93-111] 93 (04/15 0538) Resp:  [20-22] 20 (04/15 0538) BP: (92-120)/(47-74) 92/47 mmHg (04/15 0538) SpO2:  [90 %-94 %] 94 % (04/15 0851) Weight change:   Intake/Output from previous day: 04/14 0701 - 04/15 0700 In: 750 [I.V.:300; IV Piggyback:100] Out: 500 [Urine:500] Intake/Output this shift:    General appearance: uncooperative and no acknowlegment of presence, mumbling,  Resp: diminished breath sounds bilaterally and rhonchi bilaterally Cardio: S1, S2 normal and systolic murmur: holosystolic 2/6, blowing at apex GI: soft, non-tender; bowel sounds normal; no masses,  no organomegaly Extremities: edema 1+  Lab Results:  Recent Labs  12/12/15 0512 12/13/15 0504  WBC 15.4* 16.8*  HGB 11.6* 11.0*  HCT 36.7 37.4  PLT 160 122*   BMET:  Recent Labs  12/12/15 0512 12/13/15 0509  NA 154* 156*  K 5.5* 6.6*  CL 127* 126*  CO2 17* 21*  GLUCOSE 139* 109*  BUN 127* 145*  CREATININE 4.15* 5.21*  CALCIUM 10.4* 9.3    Recent Labs  12/12/15 1235  PTH 112*   Iron Studies:  Recent Labs  12/12/15 1235  IRON 73  TIBC 133*    Studies/Results: Dg Chest 2 View  12/11/2015  CLINICAL DATA:  Pneumonia. EXAM: CHEST  2 VIEW COMPARISON:  12/08/2015 and 12/09/2015 FINDINGS: Patient slightly rotated to the left. Lungs are hypoinflated with continued interval improvement in subtle opacification in the right base and mild persistent opacification of the left base. Findings likely represent resolving atelectasis or infection. No definite effusion. Cardiomediastinal silhouette and remainder of the exam is unchanged. IMPRESSION: Improving bibasilar opacification likely resolving infection or atelectasis. Electronically Signed   By: Marin Olp M.D.   On: 12/11/2015 14:47   US  Renal  12/12/2015  CLINICAL DATA:  Acute kidney injury. EXAM: RENAL / URINARY TRACT ULTRASOUND COMPLETE COMPARISON:  04/16/2013 FINDINGS: Right Kidney: Length: 12.3 cm. Diffusely increased echotexture. No mass or hydronephrosis. Left Kidney: Length: 13.3 cm. 6.2 cm cyst in the mid to lower pole. Area of calcifications noted along the posterior wall as seen on prior CT. Increased echotexture throughout the left kidney. No hydronephrosis. Bladder: Foley catheter in place, decompressed. IMPRESSION: Increased echotexture within the kidneys bilaterally compatible chronic medical renal disease. 6.2 cm minimally complex cyst in the left kidney. No hydronephrosis. Electronically Signed   By: Rolm Baptise M.D.   On: 12/12/2015 09:37    I have reviewed the patient's current medications.  Assessment/Plan: 1 AKI ??urine vol.  K ^ . Will place NG and give Kayex, clearly worsening Renal function. ATN in setting of ACEI/NSAID. CKD 4, not candidate for long term therapy without much support. Will need acute RRT most likely and need to move to [facility] step down or icu 2 Pneu on AB 3 MD illness with psychosis 4 COPD 5 HTN 6 smoker P NG, kayex, move to [facility] per hospitalists, follow K    LOS: 5 days   Cidney Kirkwood L 12/13/2015,10:08 AM

## 2015-12-13 NOTE — Progress Notes (Signed)
Report called to [place], RN, pt going to 2C-12C

## 2015-12-14 DIAGNOSIS — J449 Chronic obstructive pulmonary disease, unspecified: Secondary | ICD-10-CM

## 2015-12-14 DIAGNOSIS — J189 Pneumonia, unspecified organism: Secondary | ICD-10-CM

## 2015-12-14 DIAGNOSIS — A419 Sepsis, unspecified organism: Secondary | ICD-10-CM

## 2015-12-14 DIAGNOSIS — G9340 Encephalopathy, unspecified: Secondary | ICD-10-CM

## 2015-12-14 LAB — CBC
HCT: 28.6 % — ABNORMAL LOW (ref 36.0–46.0)
Hemoglobin: 9 g/dL — ABNORMAL LOW (ref 12.0–15.0)
MCH: 32.5 pg (ref 26.0–34.0)
MCHC: 31.5 g/dL (ref 30.0–36.0)
MCV: 103.2 fL — ABNORMAL HIGH (ref 78.0–100.0)
PLATELETS: 88 10*3/uL — AB (ref 150–400)
RBC: 2.77 MIL/uL — ABNORMAL LOW (ref 3.87–5.11)
RDW: 14 % (ref 11.5–15.5)
WBC: 15.8 10*3/uL — ABNORMAL HIGH (ref 4.0–10.5)

## 2015-12-14 LAB — GLUCOSE, CAPILLARY
GLUCOSE-CAPILLARY: 152 mg/dL — AB (ref 65–99)
GLUCOSE-CAPILLARY: 153 mg/dL — AB (ref 65–99)
GLUCOSE-CAPILLARY: 157 mg/dL — AB (ref 65–99)
GLUCOSE-CAPILLARY: 179 mg/dL — AB (ref 65–99)
Glucose-Capillary: 132 mg/dL — ABNORMAL HIGH (ref 65–99)
Glucose-Capillary: 120 mg/dL — ABNORMAL HIGH (ref 65–99)

## 2015-12-14 LAB — RENAL FUNCTION PANEL
Albumin: 1.5 g/dL — ABNORMAL LOW (ref 3.5–5.0)
Anion gap: 10 (ref 5–15)
BUN: 101 mg/dL — ABNORMAL HIGH (ref 6–20)
CALCIUM: 8.5 mg/dL — AB (ref 8.9–10.3)
CO2: 23 mmol/L (ref 22–32)
CREATININE: 3.84 mg/dL — AB (ref 0.44–1.00)
Chloride: 115 mmol/L — ABNORMAL HIGH (ref 101–111)
GFR calc Af Amer: 14 mL/min — ABNORMAL LOW (ref >60)
GFR calc non Af Amer: 12 mL/min — ABNORMAL LOW (ref >60)
GLUCOSE: 189 mg/dL — AB (ref 65–99)
Phosphorus: 4.4 mg/dL (ref 2.5–4.6)
Potassium: 3.3 mmol/L — ABNORMAL LOW (ref 3.5–5.1)
Sodium: 148 mmol/L — ABNORMAL HIGH (ref 135–145)

## 2015-12-14 MED ORDER — POTASSIUM CHLORIDE 20 MEQ PO PACK
20.0000 meq | PACK | Freq: Once | ORAL | Status: AC
  Administered 2015-12-14: 20 meq via ORAL
  Filled 2015-12-14: qty 1

## 2015-12-14 NOTE — Progress Notes (Signed)
Potassium level 3.3 called to [place], no orders given at this time, instructed by RN to notify and have nephrology md manage.

## 2015-12-14 NOTE — Progress Notes (Signed)
PULMONARY / CRITICAL CARE MEDICINE   Name: [name] MRN: [id number] DOB: 17-Nov-1960    ADMISSION DATE:  12/08/2015 CONSULTATION DATE:  12/13/2015  REFERRING MD:  Deterding  CHIEF COMPLAINT:  Renal Failure  HISTORY OF PRESENT ILLNESS:   55 yo bipolar smoke admitted /10 with weakness and cough. Her renal function declined and Renal moved her to [facility] for PCCM to place HD cath. She is not competent to sign for procedure and is estranged from her family due to life long mental illness. She is unable to cooperate for HD insertion therefore she will be moved to ICU, sedated \, intubated and HD cath attempted. Hopefully she will be liberated from vent and returned to SDU with in 24 hours.   SUBJECTIVE:  Patient is somnolent and not awake.  She is sedated and intubated.  VITAL SIGNS: BP 103/58 mmHg  Pulse 85  Temp(Src) 98.8 F (37.1 C) (Oral)  Resp 20  Ht 5\' 7"  (1.702 m)  Wt 201 lb 11.5 oz (91.5 kg)  BMI 31.59 kg/m2  SpO2 99%  HEMODYNAMICS:    VENTILATOR SETTINGS: Vent Mode:  [-] PRVC FiO2 (%):  [50 %-100 %] 50 % Set Rate:  [18 bmp-24 bmp] 24 bmp Vt Set:  [490 mL] 490 mL PEEP:  [5 cmH20] 5 cmH20 Plateau Pressure:  [17 cmH20-20 cmH20] 17 cmH20  INTAKE / OUTPUT: I/O last 3 completed shifts: In: 2523.3 [I.V.:1873.3; NG/GT:150; IV Piggyback:500] Out: 2675 [Urine:1675; Other:1000]  PHYSICAL EXAMINATION: General:  Caucasian female, intubated, sedated Neuro:  Somnolent, not awake HEENT:  [place]/AT, ETT in place Cardiovascular:  Distant heart sounds, RRR Lungs:  Ventilator assisted breaths, decreased at bases Abdomen:  Obese, soft Skin:  Intact, dry  LABS:  BMET  Recent Labs Lab 12/13/15 0509 12/13/15 1800 12/14/15 0550  NA 156* 151* 148*  K 6.6* 6.2* 3.3*  CL 126* 121* 115*  CO2 21* 20* 23  BUN 145* 157* 101*  CREATININE 5.21* 5.34* 3.84*  GLUCOSE 109* 158* 189*    Electrolytes  Recent Labs Lab 12/08/15 1606  12/13/15 0509 12/13/15 1800  12/14/15 0550  CALCIUM  --   < > 9.3 8.8* 8.5*  MG 1.9  --   --   --   --   PHOS  --   --  8.7*  --  4.4  < > = values in this interval not displayed.  CBC  Recent Labs Lab 12/12/15 0512 12/13/15 0504 12/14/15 0550  WBC 15.4* 16.8* 15.8*  HGB 11.6* 11.0* 9.0*  HCT 36.7 37.4 28.6*  PLT 160 122* 88*    Coag's  Recent Labs Lab 12/08/15 1530  APTT 32  INR 1.23    Sepsis Markers  Recent Labs Lab 12/08/15 1530 12/08/15 1533 12/08/15 1606 12/08/15 1655  LATICACIDVEN  --  0.78 0.9 0.8  PROCALCITON 1.12  --   --   --     ABG  Recent Labs Lab 12/13/15 2005  PHART 7.187*  PCO2ART 58.8*  PO2ART 238.0*    Liver Enzymes  Recent Labs Lab 12/08/15 1530 12/09/15 0516 12/13/15 0509 12/14/15 0550  AST 85* 66*  --   --   ALT 52 48  --   --   ALKPHOS 112 94  --   --   BILITOT 0.8 0.4  --   --   ALBUMIN 2.3* 2.0* 2.0* 1.5*    Cardiac Enzymes  Recent Labs Lab 12/08/15 1530 12/08/15 2125 12/09/15 0516  TROPONINI 0.03 0.03 0.03    Glucose  Recent Labs Lab 12/13/15 0735 12/13/15 1111 12/13/15 1638 12/14/15 0028 12/14/15 0349 12/14/15 0854  GLUCAP 99 90 126* 179* 157* 132*    Imaging Dg Chest Port 1 View  12/13/2015  CLINICAL DATA:  55 year old female with history of central line placement and intubation. EXAM: PORTABLE CHEST 1 VIEW COMPARISON:  Chest x-ray a 12/11/2015. FINDINGS: An endotracheal tube is in place with tip 5.1 cm above the carina. There is a right-sided internal jugular Vas-Cath with tip terminating in the proximal to mid superior vena cava. Lung volumes are low. Bibasilar opacities (left greater than right) favored to predominantly reflect subsegmental atelectasis, although underlying airspace consolidation in the left lower lobe is not excluded. Small left pleural effusion. No pneumothorax. No evidence of pulmonary edema. Heart size is normal. The patient is rotated to the left on today's exam, resulting in distortion of the  mediastinal contours and reduced diagnostic sensitivity and specificity for mediastinal pathology. Atherosclerosis in the thoracic aorta. IMPRESSION: 1. Support apparatus, as above. 2. Low lung volumes with probable bibasilar subsegmental atelectasis and small left pleural effusion. 3. Atherosclerosis. Electronically Signed   By: Vinnie Langton M.D.   On: 12/13/2015 17:40   Dg Abd Portable 1v  12/13/2015  CLINICAL DATA:  Orogastric tube placement EXAM: PORTABLE ABDOMEN - 1 VIEW COMPARISON:  Abdominal CT 04/16/2013 FINDINGS: An orogastric tube tip overlaps the gastric antral region. Normal visualized bowel gas pattern. IMPRESSION: Orogastric tube tip overlaps the distal stomach. Electronically Signed   By: Monte Fantasia M.D.   On: 12/13/2015 17:39     STUDIES:  4/10 CXR > Patchy bilateral lower lobe atelectasis or pneumonia. Mild cardiomegaly. 4/11 CXR > Interim partial clearing of bibasilar atelectasis and/or infiltrates.  Cardiomegaly. 4/12 CT Head w/o Contrast > Mild diffuse atrophy for age. No intracranial mass, hemorrhage, or focal gray - white compartment lesion. Extensive mastoid air cell disease bilaterally as well as multifocal paranasal sinus disease. 4/14 Renal US > Increased echotexture within the kidneys bilaterally compatible chronic medical renal disease. 6.2 cm minimally complex cyst in the left kidney. No hydronephrosis. 4/15 CXR > Low lung volumes with probable bibasilar subsegmental atelectasis and small left pleural effusion.  CULTURES: Blood 4/10 >> NG final Urine 4/10 >>  1,000 colonies/mL insignificant growth Sputum 4/15 >> Gram stain with abundant GPC in pairs in clusters.  Speciation and sensitivity pending 4/10 Influenza panel (-) 4/10 Strep pneumo urinary antigen (-) 4/10 Legionella urinary antigen (-) 4/10 MRSA PCR (-)  ANTIBIOTICS: 4/10 Levaquin>> 4/10 Vancomycin >>4/11 4/10 Primaxin >>4/11 4/15 Vancomycin restarted  SIGNIFICANT EVENTS: 4/15 transfer to  [facility]   LINES/TUBES: 4/15 Right IJ HD catheter Foley catheter NG/OG tube ETT  PIV x 2  DISCUSSION: 55 yo, hx bipolar with acute on chronic renal failure  ASSESSMENT / PLAN:  PULMONARY A: COPD - no active wheeze Current smoker Intubation to place HD cath P:  - bronchodilators - attempt to wean from vent quickly if possible.  May require Precedex to control agitation during extubation  CARDIOVASCULAR A:  Shock - unclear etiology of septic vs medication effect vs metabolic CAD P:  - Levophed gtt for MAP > 65 - monitor on telemetry - follow hemodynamics  RENAL A:  Acute on chronic renal failure Hypo/hyperkalemia P:  - HD catheter placed 4/15 - renal following - daily renal function - monitor I/O's, UOP - CRRT per renal  GASTROINTESTINAL A:  GI protection P:  - PPI PPx - may need tube feeds if unable to come off  vent  HEMATOLOGIC A:  DVT PPx P:  - SCDs  INFECTIOUS A:  CAP vs MRSA pneumoniabased on culture data P:  - continue Levaquin - start Vancomycin to cover MRSA - Follow CXR and clinical response to therapy - Follow sputum cultures  ENDOCRINE A:  DM 2 P:  - SSI  NEUROLOGIC A:  Metabolic encephalopathy Bipolar schizoaffective paranoid personality disorder Intubated for HD cath palcement P:  - Fentanyl gtt, Versed prn - Continuing psych meds - RASS goal: -1  - PAD protocol   FAMILY  - Updates: none  - Inter-disciplinary family meet or Palliative Care meeting due by: day 7    Pulmonary and [place] Pager: [phone]  12/14/2015, 10:51 AM

## 2015-12-14 NOTE — Progress Notes (Signed)
SLP Cancellation Note  Patient Details Name: [name] MRN: [id number] DOB: [birth year]   Cancelled treatment:       Reason Eval/Treat Not Completed: Patient not medically ready. Pt intubated and sedated. Will sign off, please reorder when ready.    Julliette Frentz, Katherene Ponto 12/14/2015, 8:40 AM

## 2015-12-14 NOTE — Progress Notes (Signed)
Subjective: Interval History: entub, sedated  Objective: Vital signs in last 24 hours: Temp:  [97.4 F (36.3 C)-98.8 F (37.1 C)] 98.8 F (37.1 C) (04/16 0350) Pulse Rate:  [45-123] 73 (04/16 0700) Resp:  [11-34] 24 (04/16 0700) BP: (77-154)/(45-134) 112/74 mmHg (04/16 0700) SpO2:  [89 %-100 %] 98 % (04/16 0712) FiO2 (%):  [50 %-100 %] 50 % (04/16 0712) Weight:  [89.132 kg (196 lb 8 oz)-92.5 kg (203 lb 14.8 oz)] 91.5 kg (201 lb 11.5 oz) (04/15 2222) Weight change:   Intake/Output from previous day: 04/15 0701 - 04/16 0700 In: 2523.3 [I.V.:1873.3; NG/GT:150; IV Piggyback:500] Out: 2175 [Urine:1175] Intake/Output this shift:    General appearance: sedated, on vent Neck: RIJ cath Resp: rales bibasilar and rhonchi bibasilar Cardio: S1, S2 normal GI: obese, pos , soft Extremities: extremities normal, atraumatic, no cyanosis or edema  Lab Results:  Recent Labs  12/13/15 0504 12/14/15 0550  WBC 16.8* 15.8*  HGB 11.0* 9.0*  HCT 37.4 28.6*  PLT 122* 88*   BMET:  Recent Labs  12/13/15 1800 12/14/15 0550  NA 151* 148*  K 6.2* 3.3*  CL 121* 115*  CO2 20* 23  GLUCOSE 158* 189*  BUN 157* 101*  CREATININE 5.34* 3.84*  CALCIUM 8.8* 8.5*    Recent Labs  12/12/15 1235  PTH 112*   Iron Studies:  Recent Labs  12/12/15 1235  IRON 73  TIBC 133*    Studies/Results: US Renal  12/12/2015  CLINICAL DATA:  Acute kidney injury. EXAM: RENAL / URINARY TRACT ULTRASOUND COMPLETE COMPARISON:  04/16/2013 FINDINGS: Right Kidney: Length: 12.3 cm. Diffusely increased echotexture. No mass or hydronephrosis. Left Kidney: Length: 13.3 cm. 6.2 cm cyst in the mid to lower pole. Area of calcifications noted along the posterior wall as seen on prior CT. Increased echotexture throughout the left kidney. No hydronephrosis. Bladder: Foley catheter in place, decompressed. IMPRESSION: Increased echotexture within the kidneys bilaterally compatible chronic medical renal disease. 6.2 cm  minimally complex cyst in the left kidney. No hydronephrosis. Electronically Signed   By: Rolm Baptise M.D.   On: 12/12/2015 09:37   Dg Chest Port 1 View  12/13/2015  CLINICAL DATA:  55 year old female with history of central line placement and intubation. EXAM: PORTABLE CHEST 1 VIEW COMPARISON:  Chest x-ray a 12/11/2015. FINDINGS: An endotracheal tube is in place with tip 5.1 cm above the carina. There is a right-sided internal jugular Vas-Cath with tip terminating in the proximal to mid superior vena cava. Lung volumes are low. Bibasilar opacities (left greater than right) favored to predominantly reflect subsegmental atelectasis, although underlying airspace consolidation in the left lower lobe is not excluded. Small left pleural effusion. No pneumothorax. No evidence of pulmonary edema. Heart size is normal. The patient is rotated to the left on today's exam, resulting in distortion of the mediastinal contours and reduced diagnostic sensitivity and specificity for mediastinal pathology. Atherosclerosis in the thoracic aorta. IMPRESSION: 1. Support apparatus, as above. 2. Low lung volumes with probable bibasilar subsegmental atelectasis and small left pleural effusion. 3. Atherosclerosis. Electronically Signed   By: Vinnie Langton M.D.   On: 12/13/2015 17:40   Dg Abd Portable 1v  12/13/2015  CLINICAL DATA:  Orogastric tube placement EXAM: PORTABLE ABDOMEN - 1 VIEW COMPARISON:  Abdominal CT 04/16/2013 FINDINGS: An orogastric tube tip overlaps the gastric antral region. Normal visualized bowel gas pattern. IMPRESSION: Orogastric tube tip overlaps the distal stomach. Electronically Signed   By: Monte Fantasia M.D.   On: 12/13/2015 17:39  I have reviewed the patient's current medications.  Assessment/Plan: 1 AKI  ACEI/NSAID  Nonoliguric, acid/base/K better. Will see how does now on her own 2 CKD 4 lowers liklihood of recovery 3 Anemia lower 4 bipolar 5 ? schizo 6 COPD 7 Resp per CCM  MS primary  issue 8 HTN not an issue 9 HPTH  P small amt K, follow urine , vent,     LOS: 6 days   [name] L 12/14/2015,7:41 AM

## 2015-12-14 NOTE — Progress Notes (Signed)
Dr Deterding in dept, md notified pt potassim level 3.3

## 2015-12-15 DIAGNOSIS — J9600 Acute respiratory failure, unspecified whether with hypoxia or hypercapnia: Secondary | ICD-10-CM

## 2015-12-15 LAB — CBC
HEMATOCRIT: 27.7 % — AB (ref 36.0–46.0)
Hemoglobin: 8.7 g/dL — ABNORMAL LOW (ref 12.0–15.0)
MCH: 31.9 pg (ref 26.0–34.0)
MCHC: 31.4 g/dL (ref 30.0–36.0)
MCV: 101.5 fL — AB (ref 78.0–100.0)
PLATELETS: 70 10*3/uL — AB (ref 150–400)
RBC: 2.73 MIL/uL — ABNORMAL LOW (ref 3.87–5.11)
RDW: 13.7 % (ref 11.5–15.5)
WBC: 10.8 10*3/uL — ABNORMAL HIGH (ref 4.0–10.5)

## 2015-12-15 LAB — PHOSPHORUS
PHOSPHORUS: 4.3 mg/dL (ref 2.5–4.6)
PHOSPHORUS: 5.7 mg/dL — AB (ref 2.5–4.6)

## 2015-12-15 LAB — COMPREHENSIVE METABOLIC PANEL
ALT: 12 U/L — ABNORMAL LOW (ref 14–54)
ANION GAP: 9 (ref 5–15)
AST: 14 U/L — ABNORMAL LOW (ref 15–41)
Albumin: 1.6 g/dL — ABNORMAL LOW (ref 3.5–5.0)
Alkaline Phosphatase: 61 U/L (ref 38–126)
BILIRUBIN TOTAL: 0.7 mg/dL (ref 0.3–1.2)
BUN: 85 mg/dL — AB (ref 6–20)
CHLORIDE: 112 mmol/L — AB (ref 101–111)
CO2: 25 mmol/L (ref 22–32)
Calcium: 8.1 mg/dL — ABNORMAL LOW (ref 8.9–10.3)
Creatinine, Ser: 3.88 mg/dL — ABNORMAL HIGH (ref 0.44–1.00)
GFR, EST AFRICAN AMERICAN: 14 mL/min — AB (ref >60)
GFR, EST NON AFRICAN AMERICAN: 12 mL/min — AB (ref >60)
Glucose, Bld: 200 mg/dL — ABNORMAL HIGH (ref 65–99)
POTASSIUM: 3.2 mmol/L — AB (ref 3.5–5.1)
Sodium: 146 mmol/L — ABNORMAL HIGH (ref 135–145)
TOTAL PROTEIN: 5 g/dL — AB (ref 6.5–8.1)

## 2015-12-15 LAB — GLUCOSE, CAPILLARY
GLUCOSE-CAPILLARY: 144 mg/dL — AB (ref 65–99)
GLUCOSE-CAPILLARY: 99 mg/dL (ref 65–99)
Glucose-Capillary: 130 mg/dL — ABNORMAL HIGH (ref 65–99)
Glucose-Capillary: 134 mg/dL — ABNORMAL HIGH (ref 65–99)
Glucose-Capillary: 144 mg/dL — ABNORMAL HIGH (ref 65–99)
Glucose-Capillary: 150 mg/dL — ABNORMAL HIGH (ref 65–99)

## 2015-12-15 LAB — VANCOMYCIN, RANDOM: VANCOMYCIN RM: 17 ug/mL

## 2015-12-15 LAB — MAGNESIUM: MAGNESIUM: 1.6 mg/dL — AB (ref 1.7–2.4)

## 2015-12-15 MED ORDER — VANCOMYCIN HCL IN DEXTROSE 1-5 GM/200ML-% IV SOLN
1000.0000 mg | INTRAVENOUS | Status: DC
  Filled 2015-12-15: qty 200

## 2015-12-15 MED ORDER — DEXMEDETOMIDINE HCL IN NACL 400 MCG/100ML IV SOLN
0.0000 ug/kg/h | INTRAVENOUS | Status: DC
  Administered 2015-12-15 – 2015-12-16 (×2): 0.5 ug/kg/h via INTRAVENOUS
  Administered 2015-12-17: 0.7 ug/kg/h via INTRAVENOUS
  Administered 2015-12-17: 0.5 ug/kg/h via INTRAVENOUS
  Filled 2015-12-15 (×2): qty 50
  Filled 2015-12-15: qty 100
  Filled 2015-12-15: qty 50
  Filled 2015-12-15 (×3): qty 100

## 2015-12-15 MED ORDER — INSULIN ASPART 100 UNIT/ML ~~LOC~~ SOLN
0.0000 [IU] | SUBCUTANEOUS | Status: DC
  Administered 2015-12-15: 2 [IU] via SUBCUTANEOUS
  Administered 2015-12-15 – 2015-12-16 (×6): 1 [IU] via SUBCUTANEOUS
  Administered 2015-12-16: 2 [IU] via SUBCUTANEOUS
  Administered 2015-12-16 – 2015-12-17 (×3): 1 [IU] via SUBCUTANEOUS
  Administered 2015-12-17: 2 [IU] via SUBCUTANEOUS
  Administered 2015-12-18: 1 [IU] via SUBCUTANEOUS
  Administered 2015-12-19 (×2): 2 [IU] via SUBCUTANEOUS
  Administered 2015-12-20 (×3): 1 [IU] via SUBCUTANEOUS
  Administered 2015-12-20: 2 [IU] via SUBCUTANEOUS
  Administered 2015-12-20 – 2015-12-21 (×2): 1 [IU] via SUBCUTANEOUS
  Administered 2015-12-21 – 2015-12-23 (×8): 2 [IU] via SUBCUTANEOUS
  Administered 2015-12-23: 5 [IU] via SUBCUTANEOUS
  Administered 2015-12-24: 1 [IU] via SUBCUTANEOUS
  Administered 2015-12-24: 2 [IU] via SUBCUTANEOUS
  Administered 2015-12-24 (×2): 1 [IU] via SUBCUTANEOUS
  Administered 2015-12-25: 2 [IU] via SUBCUTANEOUS
  Administered 2015-12-25 (×2): 1 [IU] via SUBCUTANEOUS

## 2015-12-15 MED ORDER — POTASSIUM CHLORIDE 20 MEQ PO PACK
40.0000 meq | PACK | Freq: Once | ORAL | Status: AC
  Administered 2015-12-15: 40 meq
  Filled 2015-12-15: qty 2

## 2015-12-15 MED ORDER — PRO-STAT SUGAR FREE PO LIQD
30.0000 mL | Freq: Two times a day (BID) | ORAL | Status: DC
  Administered 2015-12-15 – 2015-12-18 (×7): 30 mL
  Filled 2015-12-15 (×10): qty 30

## 2015-12-15 MED ORDER — VITAL HIGH PROTEIN PO LIQD
1000.0000 mL | ORAL | Status: DC
  Administered 2015-12-15 – 2015-12-17 (×4): 1000 mL
  Filled 2015-12-15 (×4): qty 1000

## 2015-12-15 NOTE — Progress Notes (Signed)
PULMONARY / CRITICAL CARE MEDICINE   Name: [name] MRN: [id number] DOB: 1960-11-11    ADMISSION DATE:  12/08/2015 CONSULTATION DATE:  12/13/2015  REFERRING MD:  Deterding  CHIEF COMPLAINT:  Renal Failure  HISTORY OF PRESENT ILLNESS:   55 yo bipolar smoke admitted /10 with weakness and cough. Her renal function declined and Renal moved her to [facility] for PCCM to place HD cath. She is not competent to sign for procedure and is estranged from her family due to life long mental illness. She is unable to cooperate for HD insertion therefore she will be moved to ICU, sedated \, intubated and HD cath attempted. Hopefully she will be liberated from vent and returned to SDU with in 24 hours.   SUBJECTIVE:  Patient is somnolent but awake.  She is not following commands.  She is sedated and intubated.  She is on pressors.  Overnight, her fentanyl gtt had to be increased due to agitation but is being tapered down this morning again.  RT is performing SBT.  VITAL SIGNS: BP 123/73 mmHg  Pulse 65  Temp(Src) 99.4 F (37.4 C) (Oral)  Resp 24  Ht 5\' 7"  (1.702 m)  Wt 209 lb 10.5 oz (95.1 kg)  BMI 32.83 kg/m2  SpO2 98%  HEMODYNAMICS:    VENTILATOR SETTINGS: Vent Mode:  [-] PRVC FiO2 (%):  [50 %] 50 % Set Rate:  [24 bmp] 24 bmp Vt Set:  [490 mL] 490 mL PEEP:  [5 cmH20] 5 cmH20 Plateau Pressure:  [16 cmH20-18 cmH20] 16 cmH20  INTAKE / OUTPUT: I/O last 3 completed shifts: In: [id number] [I.V.:4454; NG/GT:270; IV Piggyback:100] Out: [id number] [Urine:2050; Emesis/NG output:100; Other:1000]  PHYSICAL EXAMINATION: General:  Caucasian female, intubated, sedated Neuro:  Somnolent, not awake HEENT:  [place]/AT, ETT in place Cardiovascular:  Distant heart sounds, RRR Lungs:  Ventilator assisted breaths, decreased at bases GU: Foley Abdomen:  Obese, soft Skin:  Intact, dry  LABS:  BMET  Recent Labs Lab 12/13/15 1800 12/14/15 0550 12/15/15 0500  NA 151* 148* 146*  K 6.2* 3.3* 3.2*  CL 121*  115* 112*  CO2 20* 23 25  BUN 157* 101* 85*  CREATININE 5.34* 3.84* 3.88*  GLUCOSE 158* 189* 200*    Electrolytes  Recent Labs Lab 12/08/15 1606  12/13/15 0509 12/13/15 1800 12/14/15 0550 12/15/15 0500  CALCIUM  --   < > 9.3 8.8* 8.5* 8.1*  MG 1.9  --   --   --   --   --   PHOS  --   --  8.7*  --  4.4 4.3  < > = values in this interval not displayed.  CBC  Recent Labs Lab 12/13/15 0504 12/14/15 0550 12/15/15 0500  WBC 16.8* 15.8* 10.8*  HGB 11.0* 9.0* 8.7*  HCT 37.4 28.6* 27.7*  PLT 122* 88* 70*    Coag's  Recent Labs Lab 12/08/15 1530  APTT 32  INR 1.23    Sepsis Markers  Recent Labs Lab 12/08/15 1530 12/08/15 1533 12/08/15 1606 12/08/15 1655  LATICACIDVEN  --  0.78 0.9 0.8  PROCALCITON 1.12  --   --   --     ABG  Recent Labs Lab 12/13/15 2005  PHART 7.187*  PCO2ART 58.8*  PO2ART 238.0*    Liver Enzymes  Recent Labs Lab 12/08/15 1530 12/09/15 0516 12/13/15 0509 12/14/15 0550 12/15/15 0500  AST 85* 66*  --   --  14*  ALT 52 48  --   --  12*  ALKPHOS  112 94  --   --  61  BILITOT 0.8 0.4  --   --  0.7  ALBUMIN 2.3* 2.0* 2.0* 1.5* 1.6*    Cardiac Enzymes  Recent Labs Lab 12/08/15 1530 12/08/15 2125 12/09/15 0516  TROPONINI 0.03 0.03 0.03    Glucose  Recent Labs Lab 12/14/15 0854 12/14/15 1202 12/14/15 1558 12/14/15 2105 12/15/15 0025 12/15/15 0343  GLUCAP 132* 152* 153* 120* 144* 144*    Imaging No results found.   STUDIES:  4/10 CXR > Patchy bilateral lower lobe atelectasis or pneumonia. Mild cardiomegaly. 4/11 CXR > Interim partial clearing of bibasilar atelectasis and/or infiltrates.  Cardiomegaly. 4/12 CT Head w/o Contrast > Mild diffuse atrophy for age. No intracranial mass, hemorrhage, or focal gray - white compartment lesion. Extensive mastoid air cell disease bilaterally as well as multifocal paranasal sinus disease. 4/14 Renal US > Increased echotexture within the kidneys bilaterally compatible  chronic medical renal disease. 6.2 cm minimally complex cyst in the left kidney. No hydronephrosis. 4/15 CXR > Low lung volumes with probable bibasilar subsegmental atelectasis and small left pleural effusion.  CULTURES: Blood 4/10 >> NG final Urine 4/10 >>  1,000 colonies/mL insignificant growth Sputum 4/15 >> Gram stain with abundant GPC in pairs in clusters.  Speciation and sensitivity pending 4/10 Influenza panel (-) 4/10 Strep pneumo urinary antigen (-) 4/10 Legionella urinary antigen (-) 4/10 MRSA PCR (-)  ANTIBIOTICS: 4/10 Levaquin>> 4/10 Vancomycin >>4/11 4/10 Primaxin >>4/11 4/15 Vancomycin restarted  SIGNIFICANT EVENTS: 4/15 transfer to [facility]   LINES/TUBES: 4/15 Right IJ HD catheter Foley catheter NG/OG tube ETT  PIV x 2  DISCUSSION: 55 yo, hx bipolar with acute on chronic renal failure  ASSESSMENT / PLAN:  PULMONARY A: Less likely CAP COPD - no active wheeze Current smoker Intubation to place HD cath P:  - consider stopping Levaquin - bronchodilators -not impressed PNA -SBT cpap 5 ps 5, goal 30 m in , assess rsbi, failed, PS 12 needed  CARDIOVASCULAR A:  Shock  CAD P:  - Levophed gtt for MAP > 60 - monitor on telemetry - follow hemodynamics -cortisol if remains on pressors  RENAL A:  Acute on chronic renal failure Hypo/hyperkalemia P:  - HD catheter placed 4/15 - K low today at 3.2, may need replacement pending nephrology plans - renal following - daily renal function - monitor I/O's, UOP - CRRT per renal  GASTROINTESTINAL A:  GI protection P:  - PPI PPx - Tube feeds consult NOW, start empiric  HEMATOLOGIC A:  DVT PPx Thrombocytopenia (dilution in setting prior history) P:  - SCDs - follow CBC -no hep  INFECTIOUS A:  NOT impressed PNA P:  - discontinue Levaquin  - Follow CXR and clinical response to therapy - Follow sputum cultures  ENDOCRINE A:  DM 2 P:  - SSI  NEUROLOGIC A:   Metabolic encephalopathy Bipolar schizoaffective paranoid personality disorder Intubated for HD cath palcement P:  - Fentanyl gtt, Versed prn - Continuing psych meds -depakote level  - RASS goal: -1  - PAD protocol -may benefit from shorter acting sedation, goal to dc fent drip, add prn fent  FAMILY  - Updates: none  - Inter-disciplinary family meet or Palliative Care meeting due by: day 7    Pulmonary and [place] Pager: [phone]  12/15/2015, 7:37 AM   STAFF NOTE: I, Merrie Roof, MD FACP have personally reviewed patient's available data, including medical history, events of note, physical examination and test results as  part of my evaluation. I have discussed with resident/NP and other care providers such as pharmacist, RN and RRT. In addition, I personally evaluated patient and elicited key findings of: sedated, BS slight coarse, edema min, does NOT follow commands, weaning but needs PS 12, goal to reduce PS as able, renal to decide on HD needs but she is making good urine and crt is stable, i doubt she is a good long term HD candidate with psyc history, involve social work for decisions in future, may benafit lasix as making urine, plat are old, follow trend, consider use precedex and fent to off The patient is critically ill with multiple organ systems failure and requires high complexity decision making for assessment and support, frequent evaluation and titration of therapies, application of advanced monitoring technologies and extensive interpretation of multiple databases.   Critical Care Time devoted to patient care services described in this note is 35 Minutes. This time reflects time of care of this signee: Merrie Roof, MD FACP. This critical care time does not reflect procedure time, or teaching time or supervisory time of PA/NP/Med student/Med Resident etc but could involve care discussion time. Rest per NP/medical  resident whose note is outlined above and that I agree with   Lavon Paganini. Titus Mould, MD, [place] Pgr: [facility] 12/15/2015 11:56 AM

## 2015-12-15 NOTE — Progress Notes (Signed)
Subjective: Remains intubated.  Arouses but does not follow commands.  Objective: Vital signs in last 24 hours: Temp:  [98.8 F (37.1 C)-99.4 F (37.4 C)] 99 F (37.2 C) (04/17 0801) Pulse Rate:  [48-88] 65 (04/17 0642) Resp:  [18-26] 24 (04/17 0642) BP: (102-150)/(48-96) 123/73 mmHg (04/17 0642) SpO2:  [96 %-100 %] 98 % (04/17 0642) FiO2 (%):  [40 %-50 %] 40 % (04/17 0817) Weight:  [209 lb 10.5 oz (95.1 kg)] 209 lb 10.5 oz (95.1 kg) (04/17 0445) Weight change: 13 lb 2.5 oz (5.968 kg)  Intake/Output from previous day: 04/16 0701 - 04/17 0700 In: 3119 [I.V.:2899; NG/GT:120; IV Piggyback:100] Out: 1225 [Urine:1225] Intake/Output this shift:   General appearance: sedated, on vent, arouses but does not follow commands Neck: RIJ cath Resp: intubated, sounds from vent transmitted.  Mild bibasilar rals Cardio: S1, S2 normal GI: obese, NT/ND, soft, +BS Extremities: extremities normal, atraumatic, no cyanosis or edema  Lab Results:  Recent Labs  12/14/15 0550 12/15/15 0500  WBC 15.8* 10.8*  HGB 9.0* 8.7*  HCT 28.6* 27.7*  PLT 88* 70*   BMET:   Recent Labs  12/14/15 0550 12/15/15 0500  NA 148* 146*  K 3.3* 3.2*  CL 115* 112*  CO2 23 25  GLUCOSE 189* 200*  BUN 101* 85*  CREATININE 3.84* 3.88*  CALCIUM 8.5* 8.1*    Recent Labs  12/12/15 1235  PTH 112*   Iron Studies:   Recent Labs  12/12/15 1235  IRON 73  TIBC 133*    Studies/Results: Dg Chest Port 1 View  12/13/2015  CLINICAL DATA:  55 year old female with history of central line placement and intubation. EXAM: PORTABLE CHEST 1 VIEW COMPARISON:  Chest x-ray a 12/11/2015. FINDINGS: An endotracheal tube is in place with tip 5.1 cm above the carina. There is a right-sided internal jugular Vas-Cath with tip terminating in the proximal to mid superior vena cava. Lung volumes are low. Bibasilar opacities (left greater than right) favored to predominantly reflect subsegmental atelectasis, although underlying  airspace consolidation in the left lower lobe is not excluded. Small left pleural effusion. No pneumothorax. No evidence of pulmonary edema. Heart size is normal. The patient is rotated to the left on today's exam, resulting in distortion of the mediastinal contours and reduced diagnostic sensitivity and specificity for mediastinal pathology. Atherosclerosis in the thoracic aorta. IMPRESSION: 1. Support apparatus, as above. 2. Low lung volumes with probable bibasilar subsegmental atelectasis and small left pleural effusion. 3. Atherosclerosis. Electronically Signed   By: Vinnie Langton M.D.   On: 12/13/2015 17:40   Dg Abd Portable 1v  12/13/2015  CLINICAL DATA:  Orogastric tube placement EXAM: PORTABLE ABDOMEN - 1 VIEW COMPARISON:  Abdominal CT 04/16/2013 FINDINGS: An orogastric tube tip overlaps the gastric antral region. Normal visualized bowel gas pattern. IMPRESSION: Orogastric tube tip overlaps the distal stomach. Electronically Signed   By: Monte Fantasia M.D.   On: 12/13/2015 17:39    I have reviewed the patient's current medications.  Assessment/Plan: 1 AKI  ACEI/NSAID  Nonoliguric, slightly hypokalemic to 3.2.  Cr stable.  BUN slightly improved. 2 CKD 4 lowers liklihood of recovery 3 Anemia: hgb essentially stable from yesterday at 8.7 4 bipolar 5 ? schizo 6 COPD 7 Resp per CCM  MS primary issue 8 HTN not an issue 9 HPTH  P small amt K, follow urine , vent,     LOS: 7 days   Maeli Spacek, DO 12/15/2015,9:15 AM

## 2015-12-15 NOTE — Progress Notes (Signed)
Waste Fentanyl 75cc wasted with second Rn Lavone Neri

## 2015-12-15 NOTE — Progress Notes (Addendum)
Pharmacy Antibiotic Note  [name] is a 55 y.o. female admitted on 12/08/2015 with sepsis secondary to HCAP. 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 Pt is also on Levaquin. VR this AM 17 after Vanc 1750mg  load.   Plan: -Vancomycin IV 1000mg  q48h -F/u renal plans for dose adjustments -F/u cultures, vanc levels as needed.  Height: 5\' 7"  (170.2 cm) Weight: 209 lb 10.5 oz (95.1 kg) IBW/kg (Calculated) : 61.6  Temp (24hrs), Avg:99.1 F (37.3 C), Min:98.8 F (37.1 C), Max:99.4 F (37.4 C)   Recent Labs Lab 12/08/15 1221 12/08/15 1533 12/08/15 1606 12/08/15 1655  12/11/15 0507 12/12/15 0512 12/13/15 0504 12/13/15 0509 12/13/15 1800 12/14/15 0550 12/15/15 0500 12/15/15 0630  WBC  --   --   --   --   < > 10.2 15.4* 16.8*  --   --  15.8* 10.8*  --   CREATININE  --   --   --   --   < > 3.92* 4.15*  --  5.21* 5.34* 3.84* 3.88*  --   LATICACIDVEN 1.10 0.78 0.9 0.8  --   --   --   --   --   --   --   --   --   VANCORANDOM  --   --   --   --   --   --   --   --   --   --   --   --  17  < > = values in this interval not displayed.  Estimated Creatinine Clearance: 18.9 mL/min (by C-G formula based on Cr of 3.88).    Allergies  Allergen Reactions  . Doxycycline Other (See Comments)    REACTION: Unknown reaction  . Glipizide Other (See Comments)    unknown  . Ibuprofen Other (See Comments)    CANNOT TAKE IBUPROFEN OR MOTRIN DUE TO CURRENT MEDS  . Penicillins Other (See Comments)    Has patient had a PCN reaction causing immediate rash, facial/tongue/throat swelling, SOB or lightheadedness with hypotension: unknown Has patient had a PCN reaction causing severe rash involving mucus membranes or skin necrosis: unknown Has patient had a PCN reaction that required hospitalization Unknown Has patient had a PCN reaction occurring within the last 10 years: Unknown If all of the above answers are "NO", then may proceed with Cephalosporin use.   . Sulfonamide Derivatives    REACTION: Unknown reaction  . Tetracycline Other (See Comments)    REACTION: Unknown reaction  . Tolterodine Tartrate Itching  . Chlorostat [Chlorhexidine] Rash  . Cogentin [Benztropine] Rash  . Oxybutynin Chloride Itching and Rash   Antibiotics this Admission 4/10 >> Levofloxacin >> 4/10 >> Vancomycin >>4/11, 4/15>>     4/17 VR 17 4/10 >> Primaxin >>4/11  Cultures this Admission 4/10 BCx x2: ngtd 4/10 UCx: insignif. Growth FINAL 4/10 Influenza panel (-) 4/10 Strep pneumo urinary antigen (-) 4/10 Legionella urinary antigen: neg 4/10 MRSA PCR (-) 4/15 resp cx: GPC  Thank you for allowing pharmacy to be a part of this patient's care.  Angela Burke, PharmD Pharmacy Resident Pager: [phone] 12/15/2015 11:37 AM _______________________________________ ADDENDUM:  Antibiotics discontinued by provider. Pharmacy will sign off.   Angela Burke, PharmD Pharmacy Resident Pager: [phone]

## 2015-12-15 NOTE — Progress Notes (Signed)
Nutrition Follow-up / Consult  DOCUMENTATION CODES:   Obesity unspecified  INTERVENTION:    Initiate TF via NGT with Vital High Protein at 25 ml/h and Prostat 30 ml BID on day 1; on day 2, increase to goal rate of 40 ml/h (960 ml per day) to provide 1160 kcals, 114 gm protein, 779 ml free water daily.  NUTRITION DIAGNOSIS:   Inadequate oral intake related to lethargy/confusion, inability to eat as evidenced by NPO status.  Ongoing  GOAL:   Patient will meet greater than or equal to 90% of their needs  Unmet   MONITOR:   Diet advancement, Weight trends, Labs, I & O's  REASON FOR ASSESSMENT:   Consult Enteral/tube feeding initiation and management  ASSESSMENT:   55 year old female with a history of COPD, hypertension, chronic kidney disease stage III, bipolar disorder, who presents to the ER because of malaise and shortness of breath and cough for the last 2 weeks. She has a history of COPD and smokes. Her MDI inhaler have not provided her any relief from her shortness of breath. She denies any fever chills, rigors. Patient has significant difficulty communicating because of her bipolar disorder. However she is awake alert but disoriented. He is requesting her medications repeatedly. Therefore the patient's history is obtained from the chart. Patient is a resident of group home [facility].   Patient transferred to [facility] from [facility] on 4/15 for HD cath placement. She remains intubated. Labs reviewed: sodium elevated, potassium low, phosphorus WNL. Received MD Consult for TF initiation and management.  Patient is currently intubated on ventilator support Temp (24hrs), Avg:99.2 F (37.3 C), Min:98.9 F (37.2 C), Max:99.5 F (37.5 C)   Diet Order:  Diet NPO time specified  Skin:  Reviewed, no issues  Last BM:  4/16  Height:   Ht Readings from Last 1 Encounters:  12/13/15 5\' 7"  (1.702 m)    Weight:   Wt Readings from Last 1 Encounters:   12/15/15 209 lb 10.5 oz (95.1 kg)   12/08/15 196 lb 8 oz (89.132 kg)        Ideal Body Weight:  56.82 kg (kg)  BMI:  30.8 (using weight from admission)  Estimated Nutritional Needs:   Kcal:  [id number]  Protein:  114 gm  Fluid:  2 L  EDUCATION NEEDS:   No education needs identified at this time  Molli Barrows, [place], [place], [place] Pager [phone] After Hours Pager [phone]

## 2015-12-16 ENCOUNTER — Inpatient Hospital Stay (HOSPITAL_COMMUNITY): Payer: Medicare Other

## 2015-12-16 LAB — BASIC METABOLIC PANEL
ANION GAP: 10 (ref 5–15)
BUN: 76 mg/dL — ABNORMAL HIGH (ref 6–20)
CO2: 27 mmol/L (ref 22–32)
Calcium: 8.5 mg/dL — ABNORMAL LOW (ref 8.9–10.3)
Chloride: 112 mmol/L — ABNORMAL HIGH (ref 101–111)
Creatinine, Ser: 3.62 mg/dL — ABNORMAL HIGH (ref 0.44–1.00)
GFR calc non Af Amer: 13 mL/min — ABNORMAL LOW (ref >60)
GFR, EST AFRICAN AMERICAN: 15 mL/min — AB (ref >60)
Glucose, Bld: 155 mg/dL — ABNORMAL HIGH (ref 65–99)
POTASSIUM: 3.4 mmol/L — AB (ref 3.5–5.1)
SODIUM: 149 mmol/L — AB (ref 135–145)

## 2015-12-16 LAB — GLUCOSE, CAPILLARY
GLUCOSE-CAPILLARY: 145 mg/dL — AB (ref 65–99)
GLUCOSE-CAPILLARY: 156 mg/dL — AB (ref 65–99)
GLUCOSE-CAPILLARY: 171 mg/dL — AB (ref 65–99)
Glucose-Capillary: 135 mg/dL — ABNORMAL HIGH (ref 65–99)
Glucose-Capillary: 119 mg/dL — ABNORMAL HIGH (ref 65–99)
Glucose-Capillary: 102 mg/dL — ABNORMAL HIGH (ref 65–99)

## 2015-12-16 LAB — CBC
HCT: 26.6 % — ABNORMAL LOW (ref 36.0–46.0)
Hemoglobin: 8.5 g/dL — ABNORMAL LOW (ref 12.0–15.0)
MCH: 32.2 pg (ref 26.0–34.0)
MCHC: 32 g/dL (ref 30.0–36.0)
MCV: 100.8 fL — AB (ref 78.0–100.0)
PLATELETS: 38 10*3/uL — AB (ref 150–400)
RBC: 2.64 MIL/uL — AB (ref 3.87–5.11)
RDW: 13.5 % (ref 11.5–15.5)
WBC: 5.2 10*3/uL (ref 4.0–10.5)

## 2015-12-16 LAB — MAGNESIUM
MAGNESIUM: 1.5 mg/dL — AB (ref 1.7–2.4)
MAGNESIUM: 2.2 mg/dL (ref 1.7–2.4)
MAGNESIUM: 1.6 mg/dL — AB (ref 1.7–2.4)

## 2015-12-16 LAB — PHOSPHORUS
PHOSPHORUS: 4.2 mg/dL (ref 2.5–4.6)
PHOSPHORUS: 4 mg/dL (ref 2.5–4.6)
Phosphorus: 4.6 mg/dL (ref 2.5–4.6)

## 2015-12-16 LAB — VALPROIC ACID LEVEL: VALPROIC ACID LVL: 57 ug/mL (ref 50.0–100.0)

## 2015-12-16 MED ORDER — POTASSIUM CHLORIDE 10 MEQ/50ML IV SOLN: 10.0000 meq | INTRAVENOUS | Status: DC

## 2015-12-16 MED ORDER — MAGNESIUM SULFATE 2 GM/50ML IV SOLN
2.0000 g | Freq: Once | INTRAVENOUS | Status: AC
  Administered 2015-12-16: 2 g via INTRAVENOUS
  Filled 2015-12-16: qty 50

## 2015-12-16 MED ORDER — ANTISEPTIC ORAL RINSE SOLUTION (CORINZ)
7.0000 mL | Freq: Four times a day (QID) | OROMUCOSAL | Status: DC
  Administered 2015-12-16 (×3): 7 mL via OROMUCOSAL

## 2015-12-16 MED ORDER — FREE WATER
300.0000 mL | Freq: Four times a day (QID) | Status: DC
  Administered 2015-12-16 – 2015-12-18 (×7): 300 mL

## 2015-12-16 MED ORDER — POTASSIUM CHLORIDE 20 MEQ PO PACK
40.0000 meq | PACK | Freq: Once | ORAL | Status: AC
  Administered 2015-12-16: 40 meq
  Filled 2015-12-16: qty 2

## 2015-12-16 MED ORDER — CETYLPYRIDINIUM CHLORIDE 0.05 % MT LIQD
7.0000 mL | Freq: Two times a day (BID) | OROMUCOSAL | Status: DC
  Administered 2015-12-17 – 2015-12-25 (×9): 7 mL via OROMUCOSAL

## 2015-12-16 MED ORDER — FUROSEMIDE 10 MG/ML IJ SOLN
80.0000 mg | Freq: Two times a day (BID) | INTRAMUSCULAR | Status: DC
  Administered 2015-12-16 – 2015-12-17 (×2): 80 mg via INTRAVENOUS
  Filled 2015-12-16 (×2): qty 8

## 2015-12-16 NOTE — Progress Notes (Signed)
Subjective: Remains intubated.  Arouses but does not follow commands.  Almost 2.8L in UOP/24 hours.  Objective: Vital signs in last 24 hours: Temp:  [97.7 F (36.5 C)-99.5 F (37.5 C)] 98 F (36.7 C) (04/18 0747) Pulse Rate:  [42-92] 48 (04/18 1100) Resp:  [14-24] 17 (04/18 1100) BP: (94-156)/(50-86) 115/67 mmHg (04/18 1111) SpO2:  [93 %-100 %] 99 % (04/18 1100) FiO2 (%):  [40 %] 40 % (04/18 1111) Weight:  [206 lb 12.7 oz (93.8 kg)] 206 lb 12.7 oz (93.8 kg) (04/18 0251) Weight change: -2 lb 13.9 oz (-1.3 kg)  Intake/Output from previous day: 04/17 0701 - 04/18 0700 In: 3795.8 [I.V.:2928.5; NG/GT:617.3] Out: 2775 [Urine:2775] Intake/Output this shift: Total I/O In: 607.6 [I.V.:447.6; NG/GT:160] Out: 450 [Urine:450] General appearance: sedated, on vent, arouses but does not follow commands Neck: RIJ cath Resp: intubated Cardio: regular rate GI: obese Extremities: extremities normal, atraumatic, no cyanosis or edema  Lab Results:  Recent Labs  12/15/15 0500 12/16/15 0410  WBC 10.8* 5.2  HGB 8.7* 8.5*  HCT 27.7* 26.6*  PLT 70* 38*   BMET:   Recent Labs  12/15/15 0500 12/16/15 0410  NA 146* 149*  K 3.2* 3.4*  CL 112* 112*  CO2 25 27  GLUCOSE 200* 155*  BUN 85* 76*  CREATININE 3.88* 3.62*  CALCIUM 8.1* 8.5*   No results for input(s): PTH in the last 72 hours. Iron Studies:  No results for input(s): IRON, TIBC, TRANSFERRIN, FERRITIN in the last 72 hours.  Studies/Results: Dg Chest Port 1 View  12/16/2015  CLINICAL DATA:  Acute respiratory failure, COPD,, healthcare associated pneumonia, acute and chronic renal failure, sepsis. EXAM: PORTABLE CHEST 1 VIEW COMPARISON:  Portable chest x-ray of December 13, 2015 FINDINGS: The lungs are reasonably well inflated. There is a trace of pleural fluid at the right lung base. There is a stable small left pleural effusion. The cardiac silhouette remains enlarged. The pulmonary vascularity is mildly engorged. The pulmonary  interstitial markings are more conspicuous today. The endotracheal tube tip lies 3.7 cm above the carina. The esophagogastric tube tip projects below the inferior margin of the image. The dual-lumen dialysis type catheter tip projects over the proximal SVC. IMPRESSION: Slight interval deterioration in the appearance of the chest with worsening of interstitial edema and small pleural effusions. Electronically Signed   By: David  Martinique M.D.   On: 12/16/2015 07:09    I have reviewed the patient's current medications.  Assessment/Plan: 1 AKI  ACEI/NSAID  Nonoliguric, potassium improved to 3.4.  Cr stable/slightly improved to 3.62.  BUN improving to 76.  Almost 2.8L in UOP/24 hours.  Will continue to follow UOP, renal function. 2 CKD 4 lowers likelihood of recovery 3 Anemia: hgb essentially stable from yesterday at 8.5 4 bipolar 5 ? schizo 6 COPD 7 Resp per CCM  MS primary issue 8 HTN not an issue 9 HPTH    LOS: 8 days   Ashly Gottschalk, DO 12/16/2015,11:15 AM

## 2015-12-16 NOTE — Consult Note (Signed)
   [name] Inpatient Consult   12/16/2015  [name] October 17, 1960 [id number] Patient screened for potential [facility] Management services.   Patient is eligible for [facility] Care Management services under patient's Medicare  plan.  Chart review reveals patient has been admitted for COPD exacerbation with Respiratory Failure and intubated, currently. Will follow for progress and post hospital needs as appropriate.  Please place a [facility] Care Management consult or for questions contact:   Natividad Brood, RN BSN [facility] Liaison  [phone] business mobile phone Toll free office [phone]

## 2015-12-16 NOTE — Clinical Documentation Improvement (Addendum)
Internal Medicine Critical Care  Please clarify if the following diagnosis, Sepsis ruled in or ruled out and was:   Present at the time of admission (POA)  NOT present at the time of admission and it developed during the inpatient stay  Unable to clinically determine whether the condition was present on admission.  Unknown   Supporting Information:  Elev WBC, Resp, elev pulse on admit   ?PNA  IV Abx started, renal failure    Please exercise your independent, professional judgment when responding. A specific answer is not anticipated or expected.   Thank You,  [place] [phone]

## 2015-12-16 NOTE — Progress Notes (Signed)
PULMONARY / CRITICAL CARE MEDICINE   Name: [name] MRN: [id number] DOB: 1961-07-28    ADMISSION DATE:  12/08/2015 CONSULTATION DATE:  12/13/2015  REFERRING MD:  Deter[place]  CHIEF COMPLAINT:  Renal Failure  HISTORY OF PRESENT ILLNESS:   55 yo bipolar smoke admitted /10 with weakness and cough. Her renal function declined and Renal moved her to [facility] for PCCM to place HD cath. She is not competent to sign for procedure and is estranged from her family due to life long mental illness. She is unable to cooperate for HD insertion therefore she will be moved to ICU, sedated \, intubated and HD cath attempted. Hopefully she will be liberated from vent and returned to SDU with in 24 hours.   SUBJECTIVE:  Awake, not following commands, moving around in the bed.  VITAL SIGNS: BP 127/69 mmHg  Pulse 44  Temp(Src) 98 F (36.7 C) (Oral)  Resp 24  Ht 5\' 7"  (1.702 m)  Wt 206 lb 12.7 oz (93.8 kg)  BMI 32.38 kg/m2  SpO2 100%  HEMODYNAMICS:    VENTILATOR SETTINGS: Vent Mode:  [-] CPAP;PSV FiO2 (%):  [40 %] 40 % Set Rate:  [24 bmp] 24 bmp Vt Set:  [490 mL] 490 mL PEEP:  [5 cmH20] 5 cmH20 Pressure Support:  [10 cmH20-12 cmH20] 10 cmH20 Plateau Pressure:  [13 cmH20-18 cmH20] 18 cmH20  INTAKE / OUTPUT: I/O last 3 completed shifts: In: 5176.9 [I.V.:4229.6; Other:250; NG/GT:697.3] Out: 3650 [Urine:3650]  PHYSICAL EXAMINATION: General:  Caucasian female, intubated, sedated Neuro:  Somnolent, arouses, not following commands HEENT:  [place]/AT, ETT in place Cardiovascular:  Distant heart sounds, RRR Lungs:  Ventilator assisted breaths, decreased at bases GU: Foley Abdomen:  Obese, soft Skin:  Intact, dry  LABS:  BMET  Recent Labs Lab 12/14/15 0550 12/15/15 0500 12/16/15 0410  NA 148* 146* 149*  K 3.3* 3.2* 3.4*  CL 115* 112* 112*  CO2 23 25 27   BUN 101* 85* 76*  CREATININE 3.84* 3.88* 3.62*  GLUCOSE 189* 200* 155*    Electrolytes  Recent Labs Lab 12/14/15 0550  12/15/15 0500 12/15/15 1322 12/15/15 2345 12/16/15 0410 12/16/15 0412  CALCIUM 8.5* 8.1*  --   --  8.5*  --   MG  --   --  1.6* 1.6*  --  1.5*  PHOS 4.4 4.3 5.7* 4.0  --  4.2    CBC  Recent Labs Lab 12/14/15 0550 12/15/15 0500 12/16/15 0410  WBC 15.8* 10.8* 5.2  HGB 9.0* 8.7* 8.5*  HCT 28.6* 27.7* 26.6*  PLT 88* 70* 38*    Coag's No results for input(s): APTT, INR in the last 168 hours.  Sepsis Markers No results for input(s): LATICACIDVEN, PROCALCITON, O2SATVEN in the last 168 hours.  ABG  Recent Labs Lab 12/13/15 2005  PHART 7.187*  PCO2ART 58.8*  PO2ART 238.0*    Liver Enzymes  Recent Labs Lab 12/13/15 0509 12/14/15 0550 12/15/15 0500  AST  --   --  14*  ALT  --   --  12*  ALKPHOS  --   --  61  BILITOT  --   --  0.7  ALBUMIN 2.0* 1.5* 1.6*    Cardiac Enzymes No results for input(s): TROPONINI, PROBNP in the last 168 hours.  Glucose  Recent Labs Lab 12/15/15 0759 12/15/15 1150 12/15/15 1604 12/15/15 1926 12/15/15 2336 12/16/15 0322  GLUCAP 130* 99 134* 150* 171* 135*    Imaging Dg Chest Port 1 View  12/16/2015  CLINICAL DATA:  Acute respiratory failure, COPD,, healthcare associated pneumonia, acute and chronic renal failure, sepsis. EXAM: PORTABLE CHEST 1 VIEW COMPARISON:  Portable chest x-ray of December 13, 2015 FINDINGS: The lungs are reasonably well inflated. There is a trace of pleural fluid at the right lung base. There is a stable small left pleural effusion. The cardiac silhouette remains enlarged. The pulmonary vascularity is mildly engorged. The pulmonary interstitial markings are more conspicuous today. The endotracheal tube tip lies 3.7 cm above the carina. The esophagogastric tube tip projects below the inferior margin of the image. The dual-lumen dialysis type catheter tip projects over the proximal SVC. IMPRESSION: Slight interval deterioration in the appearance of the chest with worsening of interstitial edema and small pleural  effusions. Electronically Signed   By: David  Martinique M.D.   On: 12/16/2015 07:09     STUDIES:  4/10 CXR > Patchy bilateral lower lobe atelectasis or pneumonia. Mild cardiomegaly. 4/11 CXR > Interim partial clearing of bibasilar atelectasis and/or infiltrates.  Cardiomegaly. 4/12 CT Head w/o Contrast > Mild diffuse atrophy for age. No intracranial mass, hemorrhage, or focal gray - white compartment lesion. Extensive mastoid air cell disease bilaterally as well as multifocal paranasal sinus disease. 4/14 Renal US > Increased echotexture within the kidneys bilaterally compatible chronic medical renal disease. 6.2 cm minimally complex cyst in the left kidney. No hydronephrosis. 4/15 CXR > Low lung volumes with probable bibasilar subsegmental atelectasis and small left pleural effusion. 4/18 CXR > Slight interval deterioration in the appearance of the chest with worsening of interstitial edema and small pleural effusions  CULTURES: Blood 4/10 >> NG final Urine 4/10 >>  1,000 colonies/mL insignificant growth Sputum 4/15 >> Gram stain with abundant GPC in pairs in clusters.  Speciation and sensitivity pending 4/10 Influenza panel (-) 4/10 Strep pneumo urinary antigen (-) 4/10 Legionella urinary antigen (-) 4/10 MRSA PCR (-)  ANTIBIOTICS: 4/10 Levaquin>>4/17 4/10 Vancomycin >>4/11 4/10 Primaxin >>4/11 4/15 Vancomycin restarted >>4/17  SIGNIFICANT EVENTS: 4/15 transfer to [facility]  4/17- precedex added  LINES/TUBES: 4/15 Right IJ HD catheter Foley catheter NG/OG tube ETT  PIV x 2  DISCUSSION: 55 yo, hx bipolar with acute on chronic renal failure  ASSESSMENT / PLAN:  PULMONARY A: Less likely CAP COPD - no active wheeze Current smoker Intubation to place HD cath P:  - antibiotics stopped 4/17 - bronchodilators - attempt wean, doing well 5/5, goal 1 hr, assess rsbi -assess cough  CARDIOVASCULAR A:  Shock resolved CAD P:  - Levophed gtt for MAP > 60, off this morning -  monitor on telemetry - follow hemodynamics - cortisol if remains on pressors  RENAL A:  Acute on chronic renal failure Hypo/hyperkalemia Hypomag Hypernatremia - 3L free water deficit P:  - HD catheter placed 4/15 - K 3.4, Mg 1.5, Ph 4.2.  Replace K and Mg - d/c dextrose - d/c bicarb gtt - add free water - renal following - daily renal function - monitor I/O's, UOP - CRRT per renal  GASTROINTESTINAL A:  GI protection P:  - PPI PPx - Tube feeds   HEMATOLOGIC A:  DVT PPx Thrombocytopenia (dilution in setting prior history) pos balance daily P:  - SCDs - follow CBC - no heparin -consider lasix and follow cbc  INFECTIOUS A:  Unimpressive PNA P:  - discontinue Levaquin and Vanc as of 4/17 - Follow CXR intermittently - sputum culture with normal oropharyngeal flora  ENDOCRINE A:  DM 2 P:  - SSI  NEUROLOGIC A:  Metabolic encephalopathy Bipolar  schizoaffective paranoid personality disorder Intubated for HD cath palcement P:  - Fentanyl gtt weaned to off, Precedex gtt started, Versed & Fentanyl prn in attempt to benefit from shorter acting sedation - Continuing psych meds - depakote level within normal - RASS goal: -1  - PAD protocol  FAMILY  - Updates: none  - Inter-disciplinary family meet or Palliative Care meeting due by: day 7   Pulmonary and [place] Pager: [phone]  12/16/2015, 7:54 AM   STAFF NOTE: I, Merrie Roof, MD FACP have personally reviewed patient's available data, including medical history, events of note, physical examination and test results as part of my evaluation. I have discussed with resident/NP and other care providers such as pharmacist, RN and RRT. In addition, I personally evaluated patient and elicited key findings of: on precedex, improved calmness, lungs less coarse, weaning well cpa 5 ps 5, goal 1 hr, assess rsbi, reduce sedation, likely to extubate,  pcxr noted, some increase pos balance daily , lasix, Na free water with lasix, upright, continued psych meds, precedex may need to keep overnight?  The patient is critically ill with multiple organ systems failure and requires high complexity decision making for assessment and support, frequent evaluation and titration of therapies, application of advanced monitoring technologies and extensive interpretation of multiple databases.   Critical Care Time devoted to patient care services described in this note is 30 Minutes. This time reflects time of care of this signee: Merrie Roof, MD FACP. This critical care time does not reflect procedure time, or teaching time or supervisory time of PA/NP/Med student/Med Resident etc but could involve care discussion time. Rest per NP/medical resident whose note is outlined above and that I agree with   Lavon Paganini. Titus Mould, MD, [place] Pgr: [facility] 12/16/2015 12:06 PM

## 2015-12-16 NOTE — Procedures (Signed)
Extubation Procedure Note  Patient Details:   Name: [name] DOB: 1961/01/04 MRN: [id number]   Airway Documentation:     Evaluation  O2 sats: stable throughout Complications: No apparent complications Patient did tolerate procedure well. Bilateral Breath Sounds: Diminished, Clear   Yes   Patient extubated to 4 LNC at this time per MD order. Able to cough and clear secretions. RT to monitor as needed.  Saunders Glance 12/16/2015, 12:29 PM

## 2015-12-17 LAB — CULTURE, RESPIRATORY

## 2015-12-17 LAB — RENAL FUNCTION PANEL
ANION GAP: 9 (ref 5–15)
Albumin: 1.6 g/dL — ABNORMAL LOW (ref 3.5–5.0)
BUN: 75 mg/dL — ABNORMAL HIGH (ref 6–20)
CHLORIDE: 109 mmol/L (ref 101–111)
CO2: 32 mmol/L (ref 22–32)
Calcium: 8.7 mg/dL — ABNORMAL LOW (ref 8.9–10.3)
Creatinine, Ser: 2.99 mg/dL — ABNORMAL HIGH (ref 0.44–1.00)
GFR calc Af Amer: 19 mL/min — ABNORMAL LOW (ref >60)
GFR calc non Af Amer: 16 mL/min — ABNORMAL LOW (ref >60)
GLUCOSE: 113 mg/dL — AB (ref 65–99)
PHOSPHORUS: 4.5 mg/dL (ref 2.5–4.6)
POTASSIUM: 3.8 mmol/L (ref 3.5–5.1)
Sodium: 150 mmol/L — ABNORMAL HIGH (ref 135–145)

## 2015-12-17 LAB — CBC
HCT: 28.3 % — ABNORMAL LOW (ref 36.0–46.0)
HEMOGLOBIN: 8.8 g/dL — AB (ref 12.0–15.0)
MCH: 32 pg (ref 26.0–34.0)
MCHC: 31.1 g/dL (ref 30.0–36.0)
MCV: 102.9 fL — ABNORMAL HIGH (ref 78.0–100.0)
PLATELETS: 44 10*3/uL — AB (ref 150–400)
RBC: 2.75 MIL/uL — AB (ref 3.87–5.11)
RDW: 13.4 % (ref 11.5–15.5)
WBC: 5.8 10*3/uL (ref 4.0–10.5)

## 2015-12-17 LAB — BASIC METABOLIC PANEL
Anion gap: 10 (ref 5–15)
BUN: 80 mg/dL — AB (ref 6–20)
CHLORIDE: 105 mmol/L (ref 101–111)
CO2: 34 mmol/L — AB (ref 22–32)
CREATININE: 3.09 mg/dL — AB (ref 0.44–1.00)
Calcium: 8.7 mg/dL — ABNORMAL LOW (ref 8.9–10.3)
GFR calc Af Amer: 18 mL/min — ABNORMAL LOW (ref >60)
GFR calc non Af Amer: 16 mL/min — ABNORMAL LOW (ref >60)
GLUCOSE: 96 mg/dL (ref 65–99)
POTASSIUM: 3.9 mmol/L (ref 3.5–5.1)
Sodium: 149 mmol/L — ABNORMAL HIGH (ref 135–145)

## 2015-12-17 LAB — PHOSPHORUS
PHOSPHORUS: 4.9 mg/dL — AB (ref 2.5–4.6)
Phosphorus: 4.5 mg/dL (ref 2.5–4.6)

## 2015-12-17 LAB — GLUCOSE, CAPILLARY
GLUCOSE-CAPILLARY: 121 mg/dL — AB (ref 65–99)
GLUCOSE-CAPILLARY: 123 mg/dL — AB (ref 65–99)
GLUCOSE-CAPILLARY: 162 mg/dL — AB (ref 65–99)
Glucose-Capillary: 106 mg/dL — ABNORMAL HIGH (ref 65–99)
Glucose-Capillary: 88 mg/dL (ref 65–99)

## 2015-12-17 LAB — CULTURE, RESPIRATORY W GRAM STAIN: Culture: NORMAL

## 2015-12-17 LAB — MAGNESIUM
MAGNESIUM: 1.7 mg/dL (ref 1.7–2.4)
Magnesium: 1.8 mg/dL (ref 1.7–2.4)

## 2015-12-17 MED ORDER — HALOPERIDOL LACTATE 5 MG/ML IJ SOLN
5.0000 mg | Freq: Four times a day (QID) | INTRAMUSCULAR | Status: DC
  Administered 2015-12-17 – 2015-12-18 (×4): 5 mg via INTRAVENOUS
  Filled 2015-12-17 (×6): qty 1

## 2015-12-17 MED ORDER — VALPROATE SODIUM 250 MG/5ML PO SYRP
750.0000 mg | ORAL_SOLUTION | Freq: Three times a day (TID) | ORAL | Status: DC
  Administered 2015-12-17 – 2015-12-25 (×25): 750 mg via ORAL
  Filled 2015-12-17 (×30): qty 15

## 2015-12-17 MED ORDER — ALBUTEROL SULFATE (2.5 MG/3ML) 0.083% IN NEBU: 2.5000 mg | INHALATION_SOLUTION | Freq: Four times a day (QID) | RESPIRATORY_TRACT | Status: DC | PRN

## 2015-12-17 MED ORDER — BUDESONIDE 0.25 MG/2ML IN SUSP
0.2500 mg | Freq: Two times a day (BID) | RESPIRATORY_TRACT | Status: DC | PRN
  Filled 2015-12-17: qty 2

## 2015-12-17 NOTE — Evaluation (Signed)
Clinical/Bedside Swallow Evaluation Patient Details  Name: KYNZEE TERZIAN MRN: [id number] Date of Birth: 02-25-1961  Today's Date: 12/17/2015 Time: SLP Start Time (ACUTE ONLY): 1509 SLP Stop Time (ACUTE ONLY): 1517 SLP Time Calculation (min) (ACUTE ONLY): 8 min  Past Medical History:  Past Medical History  Diagnosis Date  . Anemia 07/10/2011  . Thrombocytopenia ([place]) 07/10/2011  . HTN (hypertension)   . COPD (chronic obstructive pulmonary disease) ([place])   . Hyperlipidemia   . Hypothyroidism   . Bipolar affective ([place])   . DM (diabetes mellitus) ([place])   . Renal insufficiency   . 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 Hypertension    Past Surgical History:  Past Surgical History  Procedure Laterality Date  . Ankle surgery Left 1989  . Tonsillectomy  AGE 40  . Breast surgery Right 2010    BREAST  . Ventral hernia repair N/A 06/06/2013    Procedure:  OPEN VENTRAL HERNIA REPAIR;  Surgeon: Imogene Burn. Georgette Dover, MD;  Location: [facility] ORS;  Service: General;  Laterality: N/A;  . Insertion of mesh N/A 06/06/2013    Procedure: INSERTION OF MESH;  Surgeon: Imogene Burn. Tsuei, MD;  Location: [facility] ORS;  Service: General;  Laterality: N/A;   HPI:  55 yo, hx bipolar with acute on chronic renal failure admitted 4/10 with sepsis secondary to HCAP. Her renal function declined so she was transferred to [facility] to place HD cath. Pt was sedated/intubated for the procedure and remained intubated 4/15-4/17.   Assessment / Plan / Recommendation Clinical Impression  Pt mostly keeps here eyes and lips closed throughout assessment, although does open her mouth x1 to accept an ice chip. Lingual manipulation is present but sluggish, with delayed swallow likely. Weak cough immediately followed. SLP attempted further thermal/gustatory stimulation but with no further bolus acceptance. Recommend to remain NPO at this time. Will continue to follow for PO readiness.    Aspiration Risk  Severe  aspiration risk    Diet Recommendation NPO   Medication Administration: Via alternative means    Other  Recommendations Oral Care Recommendations: Oral care QID   Follow up Recommendations   (tba)    Frequency and Duration min 2x/week  2 weeks       Prognosis Prognosis for Safe Diet Advancement: Fair Barriers to Reach Goals: Cognitive deficits      Swallow Study   General HPI: 55 yo, hx bipolar with acute on chronic renal failure admitted 4/10 with sepsis secondary to HCAP. Her renal function declined so she was transferred to [facility] to place HD cath. Pt was sedated/intubated for the procedure and remained intubated 4/15-4/17. Type of Study: Bedside Swallow Evaluation Previous Swallow Assessment: none in chart Diet Prior to this Study: NPO;NG Tube Temperature Spikes Noted: No Respiratory Status: Nasal cannula History of Recent Intubation: Yes Length of Intubations (days): 3 days Date extubated: 12/15/15 Behavior/Cognition: Doesn't follow directions;Lethargic/Drowsy Oral Cavity Assessment: Other (comment) (pt does not open her mouth to command ) Oral Care Completed by SLP: No (attempted, pt would not open her mouth) Self-Feeding Abilities: Total assist Patient Positioning: Upright in bed Baseline Vocal Quality: Low vocal intensity    Oral/Motor/Sensory Function Overall Oral Motor/Sensory Function:  (does not follow comands to assess)   Ice Chips Ice chips: Impaired Presentation: Spoon Oral Phase Impairments: Poor awareness of bolus;Other (comment);Reduced lingual movement/coordination (poor acceptance) Pharyngeal Phase Impairments: Suspected delayed Swallow;Cough - Immediate   Thin Liquid Thin Liquid: Not tested    Nectar  Thick Nectar Thick Liquid: Not tested   Honey Thick Honey Thick Liquid: Not tested   Puree Puree: Impaired Presentation: Spoon Oral Phase Impairments: Poor awareness of bolus   Solid   GO   Solid: Not tested        Germain Osgood, M.A.  CCC-SLP [phone]  Germain Osgood 12/17/2015,3:29 PM

## 2015-12-17 NOTE — Progress Notes (Signed)
Wasted precedex 100 cc of 447mcg/100cc concentration in sink, witnessed by nurse Darrick Meigs

## 2015-12-17 NOTE — Evaluation (Signed)
Physical Therapy Evaluation Patient Details Name: [name] MRN: [id number] DOB: [birth year] Today's Date: 12/17/2015   History of Present Illness  pt rpesents with Acute on Chronic Renal Failure and Metabolic Encephalopathy.  pt with hx of Bipolar Schizoaffective Disorder, COPD, HTN, and CKD.    Clinical Impression  Pt lethargic and only opened eyes during periods of movement.  Pt did not participate at all during session except it seemed with return to supine.  Unclear pt's baseline mobility or cognition, and if pt came from a group home.  Feel at this time pt will need SNF level of care at D/C.  Will continue to follow.      Follow Up Recommendations SNF    Equipment Recommendations  None recommended by PT    Recommendations for Other Services       Precautions / Restrictions Precautions Precautions: Fall Restrictions Weight Bearing Restrictions: No      Mobility  Bed Mobility Overal bed mobility: Needs Assistance;+2 for physical assistance Bed Mobility: Supine to Sit;Sit to Supine     Supine to sit: Total assist;+2 for physical assistance;HOB elevated Sit to supine: Max assist;+2 for physical assistance   General bed mobility comments: pt did not A with coming to sitting, but did seem to help with lowering trunk towards pillow on return to supine.    Transfers                    Ambulation/Gait                Stairs            Wheelchair Mobility    Modified Rankin (Stroke Patients Only)       Balance Overall balance assessment: Needs assistance Sitting-balance support: Feet supported Sitting balance-Leahy Scale: Zero Sitting balance - Comments: No attempts at assisting to maintain balance.                                       Pertinent Vitals/Pain Pain Assessment: Faces Faces Pain Scale: Hurts a little bit    Home Living Family/patient expects to be discharged to:: Unsure                       Prior Function           Comments: Unsure.  Some notes indicate pt was residing in a group home, but unclear level of independence.       Hand Dominance        Extremity/Trunk Assessment   Upper Extremity Assessment: Defer to OT evaluation           Lower Extremity Assessment: Generalized weakness;Difficult to assess due to impaired cognition         Communication   Communication:  (Did not verbalize.)  Cognition Arousal/Alertness: Lethargic Behavior During Therapy: Restless;Flat affect Overall Cognitive Status: Difficult to assess                      General Comments      Exercises        Assessment/Plan    PT Assessment Patient needs continued PT services  PT Diagnosis Difficulty walking;Generalized weakness;Altered mental status   PT Problem List Decreased strength;Decreased activity tolerance;Decreased balance;Decreased mobility;Decreased coordination;Decreased cognition;Decreased knowledge of use of DME;Decreased safety awareness;Cardiopulmonary status limiting activity  PT Treatment Interventions DME instruction;Gait training;Functional mobility training;Therapeutic activities;Therapeutic  exercise;Balance training;Neuromuscular re-education;Cognitive remediation;Patient/family education   PT Goals (Current goals can be found in the Care Plan section) Acute Rehab PT Goals Patient Stated Goal: pt did not verbalize during entire session. PT Goal Formulation: Patient unable to participate in goal setting Time For Goal Achievement: 12/31/15 Potential to Achieve Goals: Fair    Frequency Min 2X/week   Barriers to discharge        Co-evaluation               End of Session Equipment Utilized During Treatment: Oxygen Activity Tolerance: Patient limited by lethargy Patient left: in bed;with call bell/phone within reach;with bed alarm set;with nursing/sitter in room ([place] re-applied) Nurse Communication: Mobility status;Need for lift  equipment         Time: [id number] PT Time Calculation (min) (ACUTE ONLY): 15 min   Charges:   PT Evaluation $PT Eval Moderate Complexity: 1 Procedure     PT G CodesCatarina Hartshorn, [place] 12/17/2015, 3:33 PM

## 2015-12-17 NOTE — Progress Notes (Signed)
[facility] ROUNDING NOTE   Subjective:   Interval History:  No issues today appears to have great urine output  Objective:  Vital signs in last 24 hours:  Temp:  [97 F (36.1 C)-98.4 F (36.9 C)] 97.4 F (36.3 C) (04/19 0744) Pulse Rate:  [48-90] 80 (04/19 0900) Resp:  [15-24] 19 (04/19 0900) BP: (99-141)/(60-127) 117/66 mmHg (04/19 0900) SpO2:  [96 %-100 %] 96 % (04/19 0900) FiO2 (%):  [40 %] 40 % (04/18 1200) Weight:  [91.4 kg (201 lb 8 oz)] 91.4 kg (201 lb 8 oz) (04/19 0448)  Weight change: -2.4 kg (-5 lb 4.7 oz) Filed Weights   12/15/15 0445 12/16/15 0251 12/17/15 0448  Weight: 95.1 kg (209 lb 10.5 oz) 93.8 kg (206 lb 12.7 oz) 91.4 kg (201 lb 8 oz)    Intake/Output: I/O last 3 completed shifts: In: 5729 [I.V.:2894; NG/GT:2735; IV Piggyback:100] Out: 7350 [Urine:7350]   Intake/Output this shift:  Total I/O In: 213.2 [I.V.:133.2; NG/GT:80] Out: 425 [Urine:425]  CVS- RRR RS- CTA ABD- BS present soft non-distended EXT- no edema   Basic Metabolic Panel:  Recent Labs Lab 12/13/15 1800 12/14/15 0550 12/15/15 0500 12/15/15 1322 12/15/15 2345 12/16/15 0410 12/16/15 0412 12/16/15 1648 12/17/15 0455  NA 151* 148* 146*  --   --  149*  --   --  150*  K 6.2* 3.3* 3.2*  --   --  3.4*  --   --  3.8  CL 121* 115* 112*  --   --  112*  --   --  109  CO2 20* 23 25  --   --  27  --   --  32  GLUCOSE 158* 189* 200*  --   --  155*  --   --  113*  BUN 157* 101* 85*  --   --  76*  --   --  75*  CREATININE 5.34* 3.84* 3.88*  --   --  3.62*  --   --  2.99*  CALCIUM 8.8* 8.5* 8.1*  --   --  8.5*  --   --  8.7*  MG  --   --   --  1.6* 1.6*  --  1.5* 2.2 1.8  PHOS  --  4.4 4.3 5.7* 4.0  --  4.2 4.6 4.5  4.5    Liver Function Tests:  Recent Labs Lab 12/13/15 0509 12/14/15 0550 12/15/15 0500 12/17/15 0455  AST  --   --  14*  --   ALT  --   --  12*  --   ALKPHOS  --   --  61  --   BILITOT  --   --  0.7  --   PROT  --   --  5.0*  --   ALBUMIN 2.0*  1.5* 1.6* 1.6*   No results for input(s): LIPASE, AMYLASE in the last 168 hours. No results for input(s): AMMONIA in the last 168 hours.  CBC:  Recent Labs Lab 12/12/15 0512 12/13/15 0504 12/14/15 0550 12/15/15 0500 12/16/15 0410 12/17/15 0455  WBC 15.4* 16.8* 15.8* 10.8* 5.2 5.8  NEUTROABS 12.6*  --   --   --   --   --   HGB 11.6* 11.0* 9.0* 8.7* 8.5* 8.8*  HCT 36.7 37.4 28.6* 27.7* 26.6* 28.3*  MCV 104.6* 111.6* 103.2* 101.5* 100.8* 102.9*  PLT 160 122* 88* 70* 38* 44*    Cardiac Enzymes:  Recent Labs Lab 12/12/15 1235  CKTOTAL 149  BNP: Invalid input(s): POCBNP  CBG:  Recent Labs Lab 12/16/15 1540 12/16/15 1959 12/17/15 0041 12/17/15 0342 12/17/15 0742  GLUCAP 102* 145* 52* 121* 106*    Microbiology: Results for orders placed or performed during the hospital encounter of 12/08/15  Culture, blood (routine x 2) Call MD if unable to obtain prior to antibiotics being given     Status: None   Collection Time: 12/08/15  3:21 PM  Result Value Ref Range Status   Specimen Description BLOOD LEFT ANTECUBITAL  Final   Special Requests BOTTLES DRAWN AEROBIC AND ANAEROBIC 5CC  Final   Culture   Final    NO GROWTH 5 DAYS Performed at [facility]    Report Status 12/13/2015 FINAL  Final  Culture, blood (routine x 2) Call MD if unable to obtain prior to antibiotics being given     Status: None   Collection Time: 12/08/15  4:06 PM  Result Value Ref Range Status   Specimen Description BLOOD LEFT FOREARM  Final   Special Requests IN PEDIATRIC BOTTLE [facility]  Final   Culture   Final    NO GROWTH 5 DAYS Performed at [facility]    Report Status 12/13/2015 FINAL  Final  MRSA PCR Screening     Status: None   Collection Time: 12/08/15 10:52 PM  Result Value Ref Range Status   MRSA by PCR NEGATIVE NEGATIVE Final    Comment:        The GeneXpert MRSA Assay (FDA approved for NASAL specimens only), is one component of a comprehensive MRSA  colonization surveillance program. It is not intended to diagnose MRSA infection nor to guide or monitor treatment for MRSA infections.   Urine culture     Status: Abnormal   Collection Time: 12/08/15 11:26 PM  Result Value Ref Range Status   Specimen Description URINE, CLEAN CATCH  Final   Special Requests NONE  Final   Culture (A)  Final    1,000 COLONIES/mL INSIGNIFICANT GROWTH Performed at [facility]    Report Status 12/10/2015 FINAL  Final  Culture, expectorated sputum-assessment     Status: None   Collection Time: 12/13/15  4:55 PM  Result Value Ref Range Status   Specimen Description SPUTUM  Final   Special Requests NONE  Final   Sputum evaluation   Final    THIS SPECIMEN IS ACCEPTABLE. RESPIRATORY CULTURE REPORT TO FOLLOW.   Report Status 12/13/2015 FINAL  Final  Culture, respiratory (NON-Expectorated)     Status: None   Collection Time: 12/13/15  4:55 PM  Result Value Ref Range Status   Specimen Description SPUTUM  Final   Special Requests NONE  Final   Gram Stain   Final    ABUNDANT WBC PRESENT, PREDOMINANTLY PMN FEW SQUAMOUS EPITHELIAL CELLS PRESENT ABUNDANT GRAM POSITIVE COCCI IN PAIRS IN CLUSTERS Performed at [facility]    Culture   Final    NORMAL OROPHARYNGEAL FLORA Performed at [facility]    Report Status 12/17/2015 FINAL  Final    Coagulation Studies: No results for input(s): LABPROT, INR in the last 72 hours.  Urinalysis: No results for input(s): COLORURINE, LABSPEC, PHURINE, GLUCOSEU, HGBUR, BILIRUBINUR, KETONESUR, PROTEINUR, UROBILINOGEN, NITRITE, LEUKOCYTESUR in the last 72 hours.  Invalid input(s): APPERANCEUR    Imaging: Dg Chest Port 1 View  12/16/2015  CLINICAL DATA:  Acute respiratory failure, COPD,, healthcare associated pneumonia, acute and chronic renal failure, sepsis. EXAM: PORTABLE CHEST 1 VIEW COMPARISON:  Portable chest x-ray  of December 13, 2015 FINDINGS: The lungs are reasonably well inflated. There  is a trace of pleural fluid at the right lung base. There is a stable small left pleural effusion. The cardiac silhouette remains enlarged. The pulmonary vascularity is mildly engorged. The pulmonary interstitial markings are more conspicuous today. The endotracheal tube tip lies 3.7 cm above the carina. The esophagogastric tube tip projects below the inferior margin of the image. The dual-lumen dialysis type catheter tip projects over the proximal SVC. IMPRESSION: Slight interval deterioration in the appearance of the chest with worsening of interstitial edema and small pleural effusions. Electronically Signed   By: David  Martinique M.D.   On: 12/16/2015 07:09     Medications:   . dextrose 50 mL/hr at 12/17/15 0800  . norepinephrine (LEVOPHED) Adult infusion Stopped (12/15/15 1830)   . albuterol  2.5 mg Nebulization Q6H  . amantadine  100 mg Oral BID  . antiseptic oral rinse  7 mL Mouth Rinse q12n4p  . ARIPiprazole  15 mg Oral BH-qamhs  . benztropine  0.5 mg Oral BID  . budesonide (PULMICORT) nebulizer solution  0.25 mg Nebulization BID  . feeding supplement (PRO-STAT SUGAR FREE 64)  30 mL Per Tube BID  . feeding supplement (VITAL HIGH PROTEIN)  1,000 mL Per Tube Q24H  . free water  300 mL Per Tube Q6H  . guaiFENesin  15 mL Oral Q6H  . haloperidol lactate  5 mg Intravenous Q6H  . insulin aspart  0-9 Units Subcutaneous Q4H  . levothyroxine  37.5 mcg Intravenous Daily  . pantoprazole sodium  40 mg Per Tube Q1200  . sodium chloride  500 mL Intravenous Once  . sodium chloride flush  3 mL Intravenous Q12H  . sodium chloride flush  3 mL Intravenous Q12H  . valproic acid  750 mg Oral TID   acetaminophen (TYLENOL) oral liquid 160 mg/5 mL, acetaminophen **OR** acetaminophen, bisacodyl, diphenhydrAMINE, docusate, levalbuterol, ondansetron **OR** ondansetron (ZOFRAN) IV, oxyCODONE, polyethylene glycol  Assessment/ Plan:  1 AKI ACEI/NSAID Nonoliguric, potassium improved   Cr stable/slightly  improved  Almost 2.8L in UOP/24 hours.  2 CKD 4 lowers likelihood of recovery not candidate for long term dialysis with advanced psychosis and inability to control with antipsychotics 3 Anemia: hgb essentially stable 4 bipolar 5 ? schizo 6 COPD 7 Resp per CCM MS primary issue 8 HTN not an issue 9 HPTH     LOS: 9 Shatira Dobosz W @TODAY @9 :54 AM

## 2015-12-17 NOTE — Progress Notes (Signed)
PULMONARY / CRITICAL CARE MEDICINE   Name: [name] MRN: [id number] DOB: 1961/08/14    ADMISSION DATE:  12/08/2015 CONSULTATION DATE:  12/13/2015  REFERRING MD:  Deterding  CHIEF COMPLAINT:  Renal Failure  HISTORY OF PRESENT ILLNESS:   55 yo bipolar smoke admitted /10 with weakness and cough. Her renal function declined and Renal moved her to [facility] for PCCM to place HD cath. She is not competent to sign for procedure and is estranged from her family due to life long mental illness. She is unable to cooperate for HD insertion therefore she will be moved to ICU, sedated \, intubated and HD cath attempted. Hopefully she will be liberated from vent and returned to SDU with in 24 hours.  SUBJECTIVE:  Extubated yesterday. Will wake up to voice.  Not following commands.  Agitated overnight and trying to get out of bed.  Given Haldol that's scheduled PRN without much relief.  Precedex gtt titrated up to 0.7 mcg/kg/hr which has helped. Having good UOP with starting Lasix  VITAL SIGNS: BP 123/71 mmHg  Pulse 76  Temp(Src) 98.4 F (36.9 C) (Oral)  Resp 17  Ht 5\' 7"  (1.702 m)  Wt 201 lb 8 oz (91.4 kg)  BMI 31.55 kg/m2  SpO2 98%  HEMODYNAMICS:    VENTILATOR SETTINGS: Vent Mode:  [-] CPAP;PSV FiO2 (%):  [40 %] 40 % PEEP:  [5 cmH20] 5 cmH20 Pressure Support:  [5 cmH20-10 cmH20] 5 cmH20  INTAKE / OUTPUT: I/O last 3 completed shifts: In: 5625.5 [I.V.:2830.5; NG/GT:2695; IV Piggyback:100] Out: [id number] [Urine:7350]  PHYSICAL EXAMINATION: General:  Caucasian female, extubated, lying in bed Neuro:  Somnolent, arouses, not following commands HEENT:  [place]/AT, thick neck Cardiovascular:  Distant heart sounds, RRR Lungs:  Diminished breath sounds GU: Foley Abdomen:  Obese, soft Skin:  Intact, dry  LABS:  BMET  Recent Labs Lab 12/15/15 0500 12/16/15 0410 12/17/15 0455  NA 146* 149* 150*  K 3.2* 3.4* 3.8  CL 112* 112* 109  CO2 25 27 32  BUN 85* 76* 75*  CREATININE 3.88*  3.62* 2.99*  GLUCOSE 200* 155* 113*    Electrolytes  Recent Labs Lab 12/15/15 0500  12/16/15 0410 12/16/15 0412 12/16/15 1648 12/17/15 0455  CALCIUM 8.1*  --  8.5*  --   --  8.7*  MG  --   < >  --  1.5* 2.2 1.8  PHOS 4.3  < >  --  4.2 4.6 4.5  4.5  < > = values in this interval not displayed.  CBC  Recent Labs Lab 12/15/15 0500 12/16/15 0410 12/17/15 0455  WBC 10.8* 5.2 5.8  HGB 8.7* 8.5* 8.8*  HCT 27.7* 26.6* 28.3*  PLT 70* 38* 44*    Coag's No results for input(s): APTT, INR in the last 168 hours.  Sepsis Markers No results for input(s): LATICACIDVEN, PROCALCITON, O2SATVEN in the last 168 hours.  ABG  Recent Labs Lab 12/13/15 2005  PHART 7.187*  PCO2ART 58.8*  PO2ART 238.0*    Liver Enzymes  Recent Labs Lab 12/14/15 0550 12/15/15 0500 12/17/15 0455  AST  --  14*  --   ALT  --  12*  --   ALKPHOS  --  61  --   BILITOT  --  0.7  --   ALBUMIN 1.5* 1.6* 1.6*    Cardiac Enzymes No results for input(s): TROPONINI, PROBNP in the last 168 hours.  Glucose  Recent Labs Lab 12/16/15 0745 12/16/15 1148 12/16/15 1540 12/16/15 1959 12/17/15 0041  12/17/15 0342  GLUCAP 119* 156* 102* 145* 123* 121*    Imaging No results found.   STUDIES:  4/10 CXR > Patchy bilateral lower lobe atelectasis or pneumonia. Mild cardiomegaly. 4/11 CXR > Interim partial clearing of bibasilar atelectasis and/or infiltrates.  Cardiomegaly. 4/12 CT Head w/o Contrast > Mild diffuse atrophy for age. No intracranial mass, hemorrhage, or focal gray - white compartment lesion. Extensive mastoid air cell disease bilaterally as well as multifocal paranasal sinus disease. 4/14 Renal US > Increased echotexture within the kidneys bilaterally compatible chronic medical renal disease. 6.2 cm minimally complex cyst in the left kidney. No hydronephrosis. 4/15 CXR > Low lung volumes with probable bibasilar subsegmental atelectasis and small left pleural effusion. 4/18 CXR > Slight  interval deterioration in the appearance of the chest with worsening of interstitial edema and small pleural effusions  CULTURES: Blood 4/10 >> NG final Urine 4/10 >>  1,000 colonies/mL insignificant growth Sputum 4/15 >> normal flora 4/10 Influenza panel (-) 4/10 Strep pneumo urinary antigen (-) 4/10 Legionella urinary antigen (-) 4/10 MRSA PCR (-)  ANTIBIOTICS: 4/10 Levaquin>>4/17 4/10 Vancomycin >>4/11 4/10 Primaxin >>4/11 4/15 Vancomycin restarted >>4/17  SIGNIFICANT EVENTS: 4/15 transfer to [facility]  4/17- precedex added 4/18 extubated  LINES/TUBES: 4/15 Right IJ HD catheter Foley catheter OG tube ETT > 4/18 PIV x 2 4/15 ett>>>4/18  DISCUSSION: 55 yo, hx bipolar with acute on chronic renal failure  ASSESSMENT / PLAN:  PULMONARY A: Less likely CAP COPD - no active wheeze Current smoker P:  - antibiotics stopped 4/17 - bronchodilators, ensure prn - extubated 4/18  CARDIOVASCULAR A:  Shock resolved CAD P:  - Levophed off, consider cortisol if needs to go back on  - monitor on telemetry - follow hemodynamics  RENAL A:  Acute on chronic renal failure Hypo/hyperkalemia - better today Hypomag - better today Hypernatremia  Negative balance obtained P:  - HD catheter placed 4/15 - continue free water, increase - lasix hold until renal evaluation today  - renal following - daily renal function - monitor I/O's, UOP - CRRT per renal  GASTROINTESTINAL A:  GI protection Protein-Calorie Malnutrition P:  - PPI PPx - Tube feeds , strict aspiration precautions - SLP eval pending before starting diet  HEMATOLOGIC A:  DVT PPx Thrombocytopenia (dilution in setting prior history) - stable today P:  - SCDs - follow CBC - no heparin  INFECTIOUS A:  Unimpressive PNA P:  - discontinue Levaquin and Vanc as of 4/17 - Follow CXR intermittently - sputum culture with normal oropharyngeal flora  ENDOCRINE A:  DM 2 P:  -  SSI  NEUROLOGIC A:  Metabolic encephalopathy Bipolar schizoaffective paranoid personality disorder Intubated for HD cath palcement P:  - Precedex gtt for agitation - goal is to dc this drug and not restart - Continuing psych meds - may need safety sitter if continues to be agitated trying to get out of bed - RASS goal: -1  - PAD protocol  FAMILY  - Updates: none  - Inter-disciplinary family meet or Palliative Care meeting due by: day 7   [facility] [place] Pager: [phone]  12/17/2015, 7:27 AM   STAFF NOTE: I, Merrie Roof, MD FACP have personally reviewed patient's available data, including medical history, events of note, physical examination and test results as part of my evaluation. I have discussed with resident/NP and other care providers such as pharmacist, RN and RRT. In addition, I personally evaluated patient and elicited key findings of:  Extubated, re required precedex for agitation, no wheezing, holding lasix, great output over 5.6 liters encouraging, likely can dc HD cath in am if output remains good, increase d5w to correct Na, hold lasix today, clinically subther on depakote, increase, add scheduled haldol to dc precedex as goal, i doubt she is a long term HD candidate, slp needed to swallow, needs sitter when leave The patient is critically ill with multiple organ systems failure and requires high complexity decision making for assessment and support, frequent evaluation and titration of therapies, application of advanced monitoring technologies and extensive interpretation of multiple databases.   Critical Care Time devoted to patient care services described in this note is30 Minutes. This time reflects time of care of this signee: Merrie Roof, MD FACP. This critical care time does not reflect procedure time, or teaching time or supervisory time of PA/NP/Med student/Med Resident etc but could involve care  discussion time. Rest per NP/medical resident whose note is outlined above and that I agree with   Lavon Paganini. Titus Mould, MD, [place] Pgr: [facility] 12/17/2015 9:40 AM

## 2015-12-17 NOTE — Progress Notes (Signed)
Tried calling report to [facility].

## 2015-12-18 LAB — CBC
HEMATOCRIT: 29.6 % — AB (ref 36.0–46.0)
HEMOGLOBIN: 9 g/dL — AB (ref 12.0–15.0)
MCH: 31.6 pg (ref 26.0–34.0)
MCHC: 30.4 g/dL (ref 30.0–36.0)
MCV: 103.9 fL — AB (ref 78.0–100.0)
Platelets: 57 10*3/uL — ABNORMAL LOW (ref 150–400)
RBC: 2.85 MIL/uL — ABNORMAL LOW (ref 3.87–5.11)
RDW: 13.3 % (ref 11.5–15.5)
WBC: 7.3 10*3/uL (ref 4.0–10.5)

## 2015-12-18 LAB — PHOSPHORUS
Phosphorus: 4.8 mg/dL — ABNORMAL HIGH (ref 2.5–4.6)
Phosphorus: 5.2 mg/dL — ABNORMAL HIGH (ref 2.5–4.6)

## 2015-12-18 LAB — MAGNESIUM
MAGNESIUM: 1.7 mg/dL (ref 1.7–2.4)
MAGNESIUM: 1.8 mg/dL (ref 1.7–2.4)

## 2015-12-18 LAB — RENAL FUNCTION PANEL
ALBUMIN: 1.6 g/dL — AB (ref 3.5–5.0)
ANION GAP: 10 (ref 5–15)
BUN: 87 mg/dL — AB (ref 6–20)
CO2: 33 mmol/L — AB (ref 22–32)
Calcium: 8.7 mg/dL — ABNORMAL LOW (ref 8.9–10.3)
Chloride: 103 mmol/L (ref 101–111)
Creatinine, Ser: 3.35 mg/dL — ABNORMAL HIGH (ref 0.44–1.00)
GFR calc Af Amer: 16 mL/min — ABNORMAL LOW (ref >60)
GFR calc non Af Amer: 14 mL/min — ABNORMAL LOW (ref >60)
GLUCOSE: 92 mg/dL (ref 65–99)
PHOSPHORUS: 5 mg/dL — AB (ref 2.5–4.6)
POTASSIUM: 3.9 mmol/L (ref 3.5–5.1)
Sodium: 146 mmol/L — ABNORMAL HIGH (ref 135–145)

## 2015-12-18 LAB — GLUCOSE, CAPILLARY
GLUCOSE-CAPILLARY: 88 mg/dL (ref 65–99)
GLUCOSE-CAPILLARY: 78 mg/dL (ref 65–99)
Glucose-Capillary: 100 mg/dL — ABNORMAL HIGH (ref 65–99)
Glucose-Capillary: 101 mg/dL — ABNORMAL HIGH (ref 65–99)
Glucose-Capillary: 143 mg/dL — ABNORMAL HIGH (ref 65–99)
Glucose-Capillary: 96 mg/dL (ref 65–99)

## 2015-12-18 MED ORDER — LEVOTHYROXINE SODIUM 75 MCG PO TABS
75.0000 ug | ORAL_TABLET | Freq: Every day | ORAL | Status: DC
  Administered 2015-12-19 – 2015-12-25 (×6): 75 ug via ORAL
  Filled 2015-12-18 (×7): qty 1

## 2015-12-18 MED ORDER — RESOURCE THICKENUP CLEAR PO POWD
ORAL | Status: DC | PRN
  Filled 2015-12-18: qty 125

## 2015-12-18 MED ORDER — HALOPERIDOL LACTATE 5 MG/ML IJ SOLN
2.5000 mg | Freq: Four times a day (QID) | INTRAMUSCULAR | Status: DC
  Administered 2015-12-18 – 2015-12-21 (×12): 2.5 mg via INTRAVENOUS
  Filled 2015-12-18 (×12): qty 1

## 2015-12-18 MED ORDER — SODIUM CHLORIDE 0.9% FLUSH
10.0000 mL | INTRAVENOUS | Status: DC | PRN
  Administered 2015-12-18 – 2015-12-22 (×4): 10 mL
  Filled 2015-12-18 (×4): qty 40

## 2015-12-18 NOTE — Progress Notes (Signed)
Speech Language Pathology Treatment: Dysphagia  Patient Details Name: [name] MRN: [id number] DOB: Dec 01, 1960 Today's Date: 12/18/2015 Time: [id number] SLP Time Calculation (min) (ACUTE ONLY): 18 min  Assessment / Plan / Recommendation Clinical Impression  Pt more alert today. With visual more than verbal cues pt was eager to attempt PO trials, opening her mouth in readiness. Oral transit of ice chip was poor and swallow was not triggered, but when given tactile cues of cup touched to the lips pt pursed lips appropriately and took a sip. After initial sip there was a hard cough and a coil of the NG tube protruded from her mouth. The RN removed that tube and subsequently the pt had no further signs of aspiration with thin liquids. Her attention waxed and waned during assessment and occasionally she needed max tactile and visual cues to take cup and straw sips. Swallow was delayed when awareness was poor. Given mental status recommend a conservative diet of dys 1 (puree) and nectar thick liquids to reduce risk. SLP will f/u for upgrade as attention is more consistent.    HPI HPI: 55 yo, hx bipolar with acute on chronic renal failure admitted 4/10 with sepsis secondary to HCAP. Her renal function declined so she was transferred to [facility] to place HD cath. Pt was sedated/intubated for the procedure and remained intubated 4/15-4/17.      SLP Plan  Continue with current plan of care     Recommendations  Diet recommendations: Dysphagia 1 (puree);Nectar-thick liquid Liquids provided via: Cup;Straw Medication Administration: Crushed with puree Supervision: Full supervision/cueing for compensatory strategies;Staff to assist with self feeding Compensations: Slow rate;Small sips/bites;Minimize environmental distractions Postural Changes and/or Swallow Maneuvers: Seated upright 90 degrees             Oral Care Recommendations: Oral care BID Follow up Recommendations: Skilled Nursing  facility Plan: Continue with current plan of care     [place] Anne Pompey, MA CCC-SLP [id number]  [name] 12/18/2015, 10:05 AM

## 2015-12-18 NOTE — Progress Notes (Signed)
Patient ID: [name], female   DOB: 03/07/1961, 55 y.o.   MRN: [id number]  [name] is a 55 YO Caucasian female with a diagnosis of bipolar disorder who used to live in the [facility] for the past 8 years and reportedly relocated to [facility] for tomorrow morning house" about 2 months ago. The residential home manager visit [name] who can be reached at [phone] or [phone] via [name](family member).  She had outpatient psychiatric medication management from [facility] but she was referred to [facility], and behavioral which patient refused to participate in treatment reportedly her primary care physician in July [name] from [facility] referred her to neuropsychiatry. Reportedly patient attends [facility] on a daily basis and has no ACT team services. Patient has no guardianship and she is a her own guardian.  The above information received from [name] and sees happy to be contacted again if needed additional services.   [name],Anne R. 12/18/2015 4:03 PM

## 2015-12-18 NOTE — Progress Notes (Addendum)
Update: Spoke with [facility]. Patient is not from [place].   CSW left message for [facility] [phone]) to confirm that patient lives there.  Percell Locus Kyan Yurkovich LCSWA [phone]

## 2015-12-18 NOTE — Consult Note (Signed)
[facility] Face-to-Face Psychiatry Consult   Reason for Consult:  Delirium and psychosis Referring Physician:  Dr. Maryland Pink Patient Identification: [name] MRN:  [id number] Principal Diagnosis: Bipolar disorder, current episode manic severe with psychotic features [facility]) Diagnosis:   Patient Active Problem List   Diagnosis Date Noted  . Acute respiratory failure ([place]) [J96.00]   . Acute encephalopathy [G93.40]   . Sepsis ([place]) [A41.9]   . Renal failure [N19] 12/13/2015  . COPD with acute exacerbation ([place]) [J44.1] 12/08/2015  . HCAP (healthcare-associated pneumonia) [J18.9] 12/08/2015  . Acute renal failure ([place]) [N17.9]   . Skin lesions [L98.9] 07/04/2015  . Prediabetes [R73.03] 07/03/2015  . Bipolar disorder, current episode manic severe with psychotic features ([place]) [F31.2] 06/30/2015  . History of breast cancer in female [Z85.3] 04/10/2015  . Irreducible ventral hernia [K46.0] 04/12/2013  . HTN (hypertension) [I10]   . COPD (chronic obstructive pulmonary disease) ([place]) [J44.9]   . Chronic kidney disease [N18.9]   . Hyperlipidemia [E78.5]   . Hypothyroidism [E03.9]   . Bipolar affective ([place]) [F31.9]   . Anemia in chronic kidney disease(285.21) [N03.9, D63.1] 07/10/2011  . Thrombocytopenia ([place]) [D69.6] 07/10/2011  . BIPOLAR AFFECTIVE DISORDER [F31.9] 08/15/2006  . SMOKER [F17.200] 08/15/2006  . COPD [J44.9] 08/15/2006  . BOILS, RECURRENT [L02.92, L02.93] 08/15/2006  . ACNE ROSACEA [L71.9] 08/15/2006  . ACNE NEC [L70.8] 08/15/2006  . EDEMA [R60.9] 08/15/2006  . INCONTINENCE, URGE [N39.41] 08/15/2006    Total Time spent with patient: 1 hour  Subjective:   [name] is a 55 y.o. female patient admitted with manic psychosis and currently in delirium.  HPI:  [name] is a 55 year old female, seen, chart reviewed and case discussed with the staff RN. Patient was seen 1 week ago at [facility] for schizoaffective disorder with increased  agitation and aggressive behaviors also appear to be in delirious state. Patient was admitted to [facility] for critical care which required intubation and also required dialysis. Patient is currently appeared lying in her bed with the decreased mentation and briefly spoken less than one minute with the shock times is like yes or no before she fallen to sleep. Patient appeared to be talking herself with the slurring speech or mumbling, difficult to comprehend. Safety sitter stated that this is the maximum interaction she had with anybody since this morning. S  We will try to obtain information with her out patient psych , Case manager and ACT team services and also possible guardianship information, required latest psychiatric medication changes. She is a resident of boarding home, "[facility]". She has limited orientation even at admission to the [facility] and able to tell her address on admission. Patient brother has no contact with her several years and not interested to be in her care as per staff.   Medical history:  Patient with a history of COPD, hypertension, chronic kidney disease stage III, bipolar disorder, admitted to the hospital because of malaise and shortness of breath and cough for the last 2 weeks from boarding house.  Past Psychiatric History: She has chronic history of schizoaffective disorder, agitation and paranoid with violent behaviors. Schizoaffective disoder and recent admission to [facility] November 2016.   Risk to Self: Is patient at risk for suicide?: No Risk to Others:   Prior Inpatient Therapy:   Prior Outpatient Therapy:    Past Medical History:  Past Medical History  Diagnosis Date  . Anemia 07/10/2011  . Thrombocytopenia ([place]) 07/10/2011  . HTN (hypertension)   .  COPD (chronic obstructive pulmonary disease) ([place])   . Hyperlipidemia   . Hypothyroidism   . Bipolar affective ([place])   . DM (diabetes mellitus) ([place])   . Renal insufficiency   . 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 Hypertension     Past Surgical History  Procedure Laterality Date  . Ankle surgery Left 1989  . Tonsillectomy  AGE 42  . Breast surgery Right 2010    BREAST  . Ventral hernia repair N/A 06/06/2013    Procedure:  OPEN VENTRAL HERNIA REPAIR;  Surgeon: Imogene Burn. Georgette Dover, MD;  Location: [facility] ORS;  Service: General;  Laterality: N/A;  . Insertion of mesh N/A 06/06/2013    Procedure: INSERTION OF MESH;  Surgeon: Imogene Burn. Georgette Dover, MD;  Location: [facility] ORS;  Service: General;  Laterality: N/A;   Family History:  Family History  Problem Relation Age of Onset  . Diabetes Mother   . Cancer Father   . HIV Brother    Family Psychiatric  History: unknown Social History:  History  Alcohol Use No     History  Drug Use No    Social History   Social History  . Marital Status: Divorced    Spouse Name: N/A  . Number of Children: N/A  . Years of Education: N/A   Social History Main Topics  . Smoking status: Current Every Day Smoker -- 0.50 packs/day for 40 years    Types: Cigarettes, Cigars  . Smokeless tobacco: Never Used  . Alcohol Use: No  . Drug Use: No  . Sexual Activity: Not Asked   Other Topics Concern  . None   Social History Narrative   Additional Social History:    Allergies:   Allergies  Allergen Reactions  . Doxycycline Other (See Comments)    REACTION: Unknown reaction  . Glipizide Other (See Comments)    unknown  . Ibuprofen Other (See Comments)    CANNOT TAKE IBUPROFEN OR MOTRIN DUE TO CURRENT MEDS  . Penicillins Other (See Comments)    Has patient had a PCN reaction causing immediate rash, facial/tongue/throat swelling, SOB or lightheadedness with hypotension: unknown Has patient had a PCN reaction causing severe rash involving mucus membranes or skin necrosis: unknown Has patient had a PCN reaction that required hospitalization Unknown Has patient had a PCN reaction occurring within the last 10 years:  Unknown If all of the above answers are "NO", then may proceed with Cephalosporin use.   . Sulfonamide Derivatives     REACTION: Unknown reaction  . Tetracycline Other (See Comments)    REACTION: Unknown reaction  . Tolterodine Tartrate Itching  . Chlorostat [Chlorhexidine] Rash  . Cogentin [Benztropine] Rash  . Oxybutynin Chloride Itching and Rash    Labs:  Results for orders placed or performed during the hospital encounter of 12/08/15 (from the past 48 hour(s))  Glucose, capillary     Status: Abnormal   Collection Time: 12/16/15  3:40 PM  Result Value Ref Range   Glucose-Capillary 102 (H) 65 - 99 mg/dL  Magnesium     Status: None   Collection Time: 12/16/15  4:48 PM  Result Value Ref Range   Magnesium 2.2 1.7 - 2.4 mg/dL  Phosphorus     Status: None   Collection Time: 12/16/15  4:48 PM  Result Value Ref Range   Phosphorus 4.6 2.5 - 4.6 mg/dL  Glucose, capillary     Status: Abnormal   Collection Time: 12/16/15  7:59 PM  Result Value Ref Range   Glucose-Capillary 145 (H) 65 - 99 mg/dL   Comment 1 Notify RN   Glucose, capillary     Status: Abnormal   Collection Time: 12/17/15 12:41 AM  Result Value Ref Range   Glucose-Capillary 123 (H) 65 - 99 mg/dL   Comment 1 Notify RN   Glucose, capillary     Status: Abnormal   Collection Time: 12/17/15  3:42 AM  Result Value Ref Range   Glucose-Capillary 121 (H) 65 - 99 mg/dL   Comment 1 Notify RN   Magnesium     Status: None   Collection Time: 12/17/15  4:55 AM  Result Value Ref Range   Magnesium 1.8 1.7 - 2.4 mg/dL  Phosphorus     Status: None   Collection Time: 12/17/15  4:55 AM  Result Value Ref Range   Phosphorus 4.5 2.5 - 4.6 mg/dL  Renal function panel     Status: Abnormal   Collection Time: 12/17/15  4:55 AM  Result Value Ref Range   Sodium 150 (H) 135 - 145 mmol/L   Potassium 3.8 3.5 - 5.1 mmol/L   Chloride 109 101 - 111 mmol/L   CO2 32 22 - 32 mmol/L   Glucose, Bld 113 (H) 65 - 99 mg/dL   BUN 75 (H) 6 - 20 mg/dL    Creatinine, Ser 2.99 (H) 0.44 - 1.00 mg/dL   Calcium 8.7 (L) 8.9 - 10.3 mg/dL   Phosphorus 4.5 2.5 - 4.6 mg/dL   Albumin 1.6 (L) 3.5 - 5.0 g/dL   GFR calc non Af Amer 16 (L) >60 mL/min   GFR calc Af Amer 19 (L) >60 mL/min    Comment: (NOTE) The eGFR has been calculated using the CKD EPI equation. This calculation has not been validated in all clinical situations. eGFR's persistently <60 mL/min signify possible Chronic Kidney Disease.    Anion gap 9 5 - 15  CBC     Status: Abnormal   Collection Time: 12/17/15  4:55 AM  Result Value Ref Range   WBC 5.8 4.0 - 10.5 K/uL   RBC 2.75 (L) 3.87 - 5.11 MIL/uL   Hemoglobin 8.8 (L) 12.0 - 15.0 g/dL   HCT 28.3 (L) 36.0 - 46.0 %   MCV 102.9 (H) 78.0 - 100.0 fL   MCH 32.0 26.0 - 34.0 pg   MCHC 31.1 30.0 - 36.0 g/dL   RDW 13.4 11.5 - 15.5 %   Platelets 44 (L) 150 - 400 K/uL    Comment: CONSISTENT WITH PREVIOUS RESULT  Glucose, capillary     Status: Abnormal   Collection Time: 12/17/15  7:42 AM  Result Value Ref Range   Glucose-Capillary 106 (H) 65 - 99 mg/dL  Glucose, capillary     Status: Abnormal   Collection Time: 12/17/15 11:18 AM  Result Value Ref Range   Glucose-Capillary 162 (H) 65 - 99 mg/dL  Glucose, capillary     Status: None   Collection Time: 12/17/15  3:18 PM  Result Value Ref Range   Glucose-Capillary 88 65 - 99 mg/dL  Magnesium     Status: None   Collection Time: 12/17/15  4:57 PM  Result Value Ref Range   Magnesium 1.7 1.7 - 2.4 mg/dL  Phosphorus     Status: Abnormal   Collection Time: 12/17/15  4:57 PM  Result Value Ref Range   Phosphorus 4.9 (H) 2.5 - 4.6 mg/dL  Basic metabolic panel     Status: Abnormal   Collection Time: 12/17/15  4:57 PM  Result Value Ref Range   Sodium 149 (H) 135 - 145 mmol/L   Potassium 3.9 3.5 - 5.1 mmol/L   Chloride 105 101 - 111 mmol/L   CO2 34 (H) 22 - 32 mmol/L   Glucose, Bld 96 65 - 99 mg/dL   BUN 80 (H) 6 - 20 mg/dL   Creatinine, Ser 3.09 (H) 0.44 - 1.00 mg/dL   Calcium 8.7  (L) 8.9 - 10.3 mg/dL   GFR calc non Af Amer 16 (L) >60 mL/min   GFR calc Af Amer 18 (L) >60 mL/min    Comment: (NOTE) The eGFR has been calculated using the CKD EPI equation. This calculation has not been validated in all clinical situations. eGFR's persistently <60 mL/min signify possible Chronic Kidney Disease.    Anion gap 10 5 - 15  Glucose, capillary     Status: Abnormal   Collection Time: 12/18/15 12:19 AM  Result Value Ref Range   Glucose-Capillary 143 (H) 65 - 99 mg/dL  Glucose, capillary     Status: Abnormal   Collection Time: 12/18/15  4:20 AM  Result Value Ref Range   Glucose-Capillary 101 (H) 65 - 99 mg/dL  Magnesium     Status: None   Collection Time: 12/18/15  5:04 AM  Result Value Ref Range   Magnesium 1.7 1.7 - 2.4 mg/dL  Phosphorus     Status: Abnormal   Collection Time: 12/18/15  5:04 AM  Result Value Ref Range   Phosphorus 4.8 (H) 2.5 - 4.6 mg/dL  Renal function panel     Status: Abnormal   Collection Time: 12/18/15  5:04 AM  Result Value Ref Range   Sodium 146 (H) 135 - 145 mmol/L   Potassium 3.9 3.5 - 5.1 mmol/L   Chloride 103 101 - 111 mmol/L   CO2 33 (H) 22 - 32 mmol/L   Glucose, Bld 92 65 - 99 mg/dL   BUN 87 (H) 6 - 20 mg/dL   Creatinine, Ser 3.35 (H) 0.44 - 1.00 mg/dL   Calcium 8.7 (L) 8.9 - 10.3 mg/dL   Phosphorus 5.0 (H) 2.5 - 4.6 mg/dL   Albumin 1.6 (L) 3.5 - 5.0 g/dL   GFR calc non Af Amer 14 (L) >60 mL/min   GFR calc Af Amer 16 (L) >60 mL/min    Comment: (NOTE) The eGFR has been calculated using the CKD EPI equation. This calculation has not been validated in all clinical situations. eGFR's persistently <60 mL/min signify possible Chronic Kidney Disease.    Anion gap 10 5 - 15  CBC     Status: Abnormal   Collection Time: 12/18/15  5:04 AM  Result Value Ref Range   WBC 7.3 4.0 - 10.5 K/uL   RBC 2.85 (L) 3.87 - 5.11 MIL/uL   Hemoglobin 9.0 (L) 12.0 - 15.0 g/dL   HCT 29.6 (L) 36.0 - 46.0 %   MCV 103.9 (H) 78.0 - 100.0 fL   MCH 31.6  26.0 - 34.0 pg   MCHC 30.4 30.0 - 36.0 g/dL   RDW 13.3 11.5 - 15.5 %   Platelets 57 (L) 150 - 400 K/uL    Comment: SPECIMEN CHECKED FOR CLOTS REPEATED TO VERIFY CONSISTENT WITH PREVIOUS RESULT   Glucose, capillary     Status: None   Collection Time: 12/18/15  7:54 AM  Result Value Ref Range   Glucose-Capillary 88 65 - 99 mg/dL  Glucose, capillary     Status: None   Collection Time: 12/18/15 11:35 AM  Result Value Ref Range   Glucose-Capillary 78 65 - 99 mg/dL    Current Facility-Administered Medications  Medication Dose Route Frequency Provider Last Rate Last Dose  . acetaminophen (TYLENOL) solution 650 mg  650 mg Oral Q6H PRN Kelvin Cellar, MD      . acetaminophen (TYLENOL) tablet 650 mg  650 mg Oral Q6H PRN Reyne Dumas, MD       Or  . acetaminophen (TYLENOL) suppository 650 mg  650 mg Rectal Q6H PRN Reyne Dumas, MD      . albuterol (PROVENTIL) (2.5 MG/3ML) 0.083% nebulizer solution 2.5 mg  2.5 mg Nebulization Q6H PRN Jule Ser, DO      . amantadine (SYMMETREL) solution 100 mg  100 mg Oral BID Kelvin Cellar, MD   100 mg at 12/18/15 1145  . antiseptic oral rinse (CPC / CETYLPYRIDINIUM CHLORIDE 0.05%) solution 7 mL  7 mL Mouth Rinse q12n4p Collene Gobble, MD   7 mL at 12/17/15 1600  . ARIPiprazole (ABILIFY) tablet 15 mg  15 mg Oral BH-qamhs Reyne Dumas, MD   15 mg at 12/18/15 1146  . benztropine (COGENTIN) tablet 0.5 mg  0.5 mg Oral BID Reyne Dumas, MD   0.5 mg at 12/18/15 1146  . budesonide (PULMICORT) nebulizer solution 0.25 mg  0.25 mg Nebulization BID PRN Jule Ser, DO      . dextrose 5 % solution   Intravenous Continuous Lucious Groves, DO 100 mL/hr at 12/17/15 1137    . diphenhydrAMINE (BENADRYL) capsule 50 mg  50 mg Oral Q6H PRN Ambrose Finland, MD      . feeding supplement (PRO-STAT SUGAR FREE 64) liquid 30 mL  30 mL Per Tube BID Maryellen Pile, MD   30 mL at 12/17/15 2323  . feeding supplement (VITAL HIGH PROTEIN) liquid 1,000 mL  1,000 mL Per Tube  Q24H Maryellen Pile, MD   1,000 mL at 12/17/15 1823  . free water 300 mL  300 mL Per Tube Q6H Maryellen Pile, MD   300 mL at 12/18/15 0700  . guaiFENesin (ROBITUSSIN) 100 MG/5ML solution 300 mg  15 mL Oral Q6H Kelvin Cellar, MD   300 mg at 12/18/15 0421  . haloperidol lactate (HALDOL) injection 2.5 mg  2.5 mg Intravenous Q6H Bonnielee Haff, MD   2.5 mg at 12/18/15 1143  . insulin aspart (novoLOG) injection 0-9 Units  0-9 Units Subcutaneous Q4H Collene Gobble, MD   1 Units at 12/18/15 0049  . levothyroxine (SYNTHROID, LEVOTHROID) tablet 75 mcg  75 mcg Oral QAC breakfast Lauren D Bajbus, RPH   0 mcg at 12/18/15 1130  . ondansetron (ZOFRAN) tablet 4 mg  4 mg Oral Q6H PRN Reyne Dumas, MD       Or  . ondansetron (ZOFRAN) injection 4 mg  4 mg Intravenous Q6H PRN Reyne Dumas, MD      . oxyCODONE (Oxy IR/ROXICODONE) immediate release tablet 5 mg  5 mg Oral Q4H PRN Reyne Dumas, MD      . pantoprazole sodium (PROTONIX) 40 mg/20 mL oral suspension 40 mg  40 mg Per Tube Q1200 Kara Mead V, MD   40 mg at 12/18/15 1146  . polyethylene glycol (MIRALAX / GLYCOLAX) packet 17 g  17 g Oral Daily PRN Reyne Dumas, MD      . RESOURCE THICKENUP CLEAR   Oral PRN Bonnielee Haff, MD      . sodium chloride 0.9 % bolus 500 mL  500 mL Intravenous Once Reyne Dumas, MD   500 mL  at 12/08/15 1555  . sodium chloride flush (NS) 0.9 % injection 3 mL  3 mL Intravenous Q12H Reyne Dumas, MD   3 mL at 12/18/15 1000  . sodium chloride flush (NS) 0.9 % injection 3 mL  3 mL Intravenous Q12H Kelvin Cellar, MD   3 mL at 12/17/15 0921  . valproic acid (DEPAKENE) 250 MG/5ML syrup 750 mg  750 mg Oral TID Lucious Groves, DO   750 mg at 12/18/15 1000    Musculoskeletal: Strength & Muscle Tone: decreased Gait & Station: unable to stand Patient leans: N/A  Psychiatric Specialty Exam: Review of Systems  Unable to perform ROS    Blood pressure 142/91, pulse 91, temperature 98.4 F (36.9 C), temperature source Oral, resp. rate 18,  height 5' 7"  (1.702 m), weight 91 kg (200 lb 9.9 oz), SpO2 99 %.Body mass index is 31.41 kg/(m^2).  General Appearance: Bizarre and Disheveled  Eye Contact::  Poor  Speech:  Garbled and Slurred  Volume:  Decreased  Mood:  Anxious, Depressed and Irritable  Affect:  Non-Congruent and Inappropriate  Thought Process:  Disorganized  Orientation:  Negative  Thought Content:  Delusions  Suicidal Thoughts:  No  Homicidal Thoughts:  No  Memory:  Negative  Judgement:  Poor  Insight:  Negative  Psychomotor Activity:  Restlessness  Concentration:  Poor  Recall:  Poor  Fund of Knowledge:Poor  Language: Poor  Akathisia:  Yes  Handed:  Right  AIMS (if indicated):     Assets:  Others:  differed  ADL's:  Impaired  Cognition: Impaired,  Severe  Sleep:      Treatment Plan Summary: Case discussed with LCSW and case manager requesting to contact [facility] regarding outpatient psychiatric services, possible ACT team services and also last date of Abilify depot injection given , due date and possible guardianship details if available  Continue Abilify 15 mg PO BID if she can tolerate Continue Cogentin 0.5 mA twice daily for EPS Continue Depakene 750 mg IV TD for bipolar mania, (valproic acid level is 92 which is within therapeutic range) Continue Synthroid 75 mcg daily for hypothyroidsm, will check TSH and free T4 in the morning Discontinue sedating medication like benadryl and pain killers as patient having hard time to stay awake for examination Will check with her last Abilify Maintena 400 mg QMonth - does not know her last dose  Appreciate psychiatric consultation and follow up as clinically required Please contact [phone] or [phone] if needs further assistance  Disposition: Patient was not able to participate adult daily living so she may not be appropriate for inpatient psychiatric hospitalization. Patient may benefit from the med psych facility if available. Patient does not meet  criteria for psychiatric inpatient admission. Supportive therapy provided about ongoing stressors.  Durward Parcel., MD 12/18/2015 12:03 PM

## 2015-12-18 NOTE — Progress Notes (Signed)
Pt's NGT was curled up in mouth during swallow test so tube was removed at 1010. Dr Maryland Pink called in and discussed with him and said to ;eave ngt out and see how it goes with pt's diet and swallowing meds since diet has been increased to dys 1, crush meds.  Pt was able to take meds with pudding and thickened meds. Did not pocket any food during time. Pt does need to be redirected several times during meds/eating.

## 2015-12-18 NOTE — Care Management Important Message (Signed)
Important Message  Patient Details  Name: [name] MRN: [id number] Date of Birth: 07-15-1961   Medicare Important Message Given:  Yes    Joy Reiger P Haddy Mullinax 12/18/2015, 4:22 PM

## 2015-12-18 NOTE — Progress Notes (Signed)
[facility] ROUNDING NOTE   Subjective:   Interval History:  Reports that she does not feel well today. Continues to have great urine output (3.175L UOP/24 hours)  Objective:  Vital signs in last 24 hours:  Temp:  [98.4 F (36.9 C)-98.5 F (36.9 C)] 98.4 F (36.9 C) (04/20 0510) Pulse Rate:  [72-112] 91 (04/20 0654) Resp:  [18-28] 18 (04/20 0510) BP: (93-155)/(63-137) 142/91 mmHg (04/20 0654) SpO2:  [94 %-99 %] 99 % (04/20 0510) Weight:  [200 lb 9.9 oz (91 kg)] 200 lb 9.9 oz (91 kg) (04/20 [id number])  Weight change: -14.1 oz (-0.4 kg) Filed Weights   12/16/15 0251 12/17/15 0448 12/18/15 [id number]  Weight: 206 lb 12.7 oz (93.8 kg) 201 lb 8 oz (91.4 kg) 200 lb 9.9 oz (91 kg)    Intake/Output: I/O last 3 completed shifts: In: 4883.9 [I.V.:1828.9; NG/GT:3055] Out: 7000 [Urine:7000]   Intake/Output this shift:    Gen: awake, sitting up in bed CVS- RRR RS- normal WOB on room air ABD- + BS, soft, non-distended EXT- no LE edema   Basic Metabolic Panel:  Recent Labs Lab 12/15/15 0500  12/16/15 0410 12/16/15 0412 12/16/15 1648 12/17/15 0455 12/17/15 1657 12/18/15 0504  NA 146*  --  149*  --   --  150* 149* 146*  K 3.2*  --  3.4*  --   --  3.8 3.9 3.9  CL 112*  --  112*  --   --  109 105 103  CO2 25  --  27  --   --  32 34* 33*  GLUCOSE 200*  --  155*  --   --  113* 96 92  BUN 85*  --  76*  --   --  75* 80* 87*  CREATININE 3.88*  --  3.62*  --   --  2.99* 3.09* 3.35*  CALCIUM 8.1*  --  8.5*  --   --  8.7* 8.7* 8.7*  MG  --   < >  --  1.5* 2.2 1.8 1.7 1.7  PHOS 4.3  < >  --  4.2 4.6 4.5  4.5 4.9* 5.0*  4.8*  < > = values in this interval not displayed.  Liver Function Tests:  Recent Labs Lab 12/13/15 0509 12/14/15 0550 12/15/15 0500 12/17/15 0455 12/18/15 0504  AST  --   --  14*  --   --   ALT  --   --  12*  --   --   ALKPHOS  --   --  61  --   --   BILITOT  --   --  0.7  --   --   PROT  --   --  5.0*  --   --   ALBUMIN 2.0* 1.5* 1.6* 1.6* 1.6*    No results for input(s): LIPASE, AMYLASE in the last 168 hours. No results for input(s): AMMONIA in the last 168 hours.  CBC:  Recent Labs Lab 12/12/15 0512  12/14/15 0550 12/15/15 0500 12/16/15 0410 12/17/15 0455 12/18/15 0504  WBC 15.4*  < > 15.8* 10.8* 5.2 5.8 7.3  NEUTROABS 12.6*  --   --   --   --   --   --   HGB 11.6*  < > 9.0* 8.7* 8.5* 8.8* 9.0*  HCT 36.7  < > 28.6* 27.7* 26.6* 28.3* 29.6*  MCV 104.6*  < > 103.2* 101.5* 100.8* 102.9* 103.9*  PLT 160  < > 88* 70* 38* 44* 57*  < > =  values in this interval not displayed.  Cardiac Enzymes:  Recent Labs Lab 12/12/15 1235  CKTOTAL 149    BNP: Invalid input(s): POCBNP  CBG:  Recent Labs Lab 12/17/15 1118 12/17/15 1518 12/18/15 0019 12/18/15 0420 12/18/15 0754  GLUCAP 162* 88 143* 101* 76    Microbiology: Results for orders placed or performed during the hospital encounter of 12/08/15  Culture, blood (routine x 2) Call MD if unable to obtain prior to antibiotics being given     Status: None   Collection Time: 12/08/15  3:21 PM  Result Value Ref Range Status   Specimen Description BLOOD LEFT ANTECUBITAL  Final   Special Requests BOTTLES DRAWN AEROBIC AND ANAEROBIC 5CC  Final   Culture   Final    NO GROWTH 5 DAYS Performed at [facility]    Report Status 12/13/2015 FINAL  Final  Culture, blood (routine x 2) Call MD if unable to obtain prior to antibiotics being given     Status: None   Collection Time: 12/08/15  4:06 PM  Result Value Ref Range Status   Specimen Description BLOOD LEFT FOREARM  Final   Special Requests IN PEDIATRIC BOTTLE [facility]  Final   Culture   Final    NO GROWTH 5 DAYS Performed at [facility]    Report Status 12/13/2015 FINAL  Final  MRSA PCR Screening     Status: None   Collection Time: 12/08/15 10:52 PM  Result Value Ref Range Status   MRSA by PCR NEGATIVE NEGATIVE Final    Comment:        The GeneXpert MRSA Assay (FDA approved for NASAL specimens only), is  one component of a comprehensive MRSA colonization surveillance program. It is not intended to diagnose MRSA infection nor to guide or monitor treatment for MRSA infections.   Urine culture     Status: Abnormal   Collection Time: 12/08/15 11:26 PM  Result Value Ref Range Status   Specimen Description URINE, CLEAN CATCH  Final   Special Requests NONE  Final   Culture (A)  Final    1,000 COLONIES/mL INSIGNIFICANT GROWTH Performed at [facility]    Report Status 12/10/2015 FINAL  Final  Culture, expectorated sputum-assessment     Status: None   Collection Time: 12/13/15  4:55 PM  Result Value Ref Range Status   Specimen Description SPUTUM  Final   Special Requests NONE  Final   Sputum evaluation   Final    THIS SPECIMEN IS ACCEPTABLE. RESPIRATORY CULTURE REPORT TO FOLLOW.   Report Status 12/13/2015 FINAL  Final  Culture, respiratory (NON-Expectorated)     Status: None   Collection Time: 12/13/15  4:55 PM  Result Value Ref Range Status   Specimen Description SPUTUM  Final   Special Requests NONE  Final   Gram Stain   Final    ABUNDANT WBC PRESENT, PREDOMINANTLY PMN FEW SQUAMOUS EPITHELIAL CELLS PRESENT ABUNDANT GRAM POSITIVE COCCI IN PAIRS IN CLUSTERS Performed at [facility]    Culture   Final    NORMAL OROPHARYNGEAL FLORA Performed at [facility]    Report Status 12/17/2015 FINAL  Final    Coagulation Studies: No results for input(s): LABPROT, INR in the last 72 hours.  Urinalysis: No results for input(s): COLORURINE, LABSPEC, PHURINE, GLUCOSEU, HGBUR, BILIRUBINUR, KETONESUR, PROTEINUR, UROBILINOGEN, NITRITE, LEUKOCYTESUR in the last 72 hours.  Invalid input(s): APPERANCEUR    Imaging: No results found.   Medications:   . dextrose 100 mL/hr at  12/17/15 1137   . amantadine  100 mg Oral BID  . antiseptic oral rinse  7 mL Mouth Rinse q12n4p  . ARIPiprazole  15 mg Oral BH-qamhs  . benztropine  0.5 mg Oral BID  . feeding supplement  (PRO-STAT SUGAR FREE 64)  30 mL Per Tube BID  . feeding supplement (VITAL HIGH PROTEIN)  1,000 mL Per Tube Q24H  . free water  300 mL Per Tube Q6H  . guaiFENesin  15 mL Oral Q6H  . haloperidol lactate  2.5 mg Intravenous Q6H  . insulin aspart  0-9 Units Subcutaneous Q4H  . levothyroxine  75 mcg Oral QAC breakfast  . pantoprazole sodium  40 mg Per Tube Q1200  . sodium chloride  500 mL Intravenous Once  . sodium chloride flush  3 mL Intravenous Q12H  . sodium chloride flush  3 mL Intravenous Q12H  . valproic acid  750 mg Oral TID   acetaminophen (TYLENOL) oral liquid 160 mg/5 mL, acetaminophen **OR** acetaminophen, albuterol, budesonide (PULMICORT) nebulizer solution, diphenhydrAMINE, ondansetron **OR** ondansetron (ZOFRAN) IV, oxyCODONE, polyethylene glycol, RESOURCE THICKENUP CLEAR  Assessment/ Plan:  1 AKI ACEI/NSAID; Potassium stable 3.9. Cr slightly worse today.  However, continues to have good UOP, almost 3.2L in UOP/24 hours.  2 CKD 4 lowers likelihood of recovery not candidate for long term dialysis with advanced psychosis and inability to control with antipsychotics 3 Anemia: hgb stable 4 bipolar 5 ? schizo 6 COPD 7 Resp per CCM MS primary issue 8 HTN not an issue 9 HPTH     LOS: [place], DO 12/17/1709:36 AM

## 2015-12-18 NOTE — Progress Notes (Addendum)
[facility] PROGRESS NOTE  Anne MEGEE [id number] DOB: 1960/09/09 DOA: 12/08/2015  PCP: Benito Mccreedy, MD  Brief HPI: 55 yo female with bipolar disorder, [psychosis was admitted with weakness and cough. Her renal function declined and she was moved to [facility] for PCCM to place HD cath. She is not competent to sign for procedure and is estranged from her family due to life long mental illness. She was unable to cooperate for HD insertion therefore she was moved to ICU, sedated, intubated and HD cath placed. She was dialyzed by nephrology. Patient was subsequently extubated.   Past medical history:  Past Medical History  Diagnosis Date  . Anemia 07/10/2011  . Thrombocytopenia ([place]) 07/10/2011  . HTN (hypertension)   . COPD (chronic obstructive pulmonary disease) ([place])   . Hyperlipidemia   . Hypothyroidism   . Bipolar affective ([place])   . DM (diabetes mellitus) ([place])   . Renal insufficiency   . 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 Hypertension     Consultants: Nephrology,   STUDIES:  4/10 CXR > Patchy bilateral lower lobe atelectasis or pneumonia. Mild cardiomegaly. 4/11 CXR > Interim partial clearing of bibasilar atelectasis and/or infiltrates. Cardiomegaly. 4/12 CT Head w/o Contrast > Mild diffuse atrophy for age. No intracranial mass, hemorrhage, or focal gray - white compartment lesion. Extensive mastoid air cell disease bilaterally as well as multifocal paranasal sinus disease. 4/14 Renal US > Increased echotexture within the kidneys bilaterally compatible chronic medical renal disease. 6.2 cm minimally complex cyst in the left kidney. No hydronephrosis. 4/15 CXR > Low lung volumes with probable bibasilar subsegmental atelectasis and small left pleural effusion. 4/18 CXR > Slight interval deterioration in the appearance of the chest with worsening of interstitial edema and small pleural effusions  CULTURES: Blood 4/10 >> NG  final Urine 4/10 >> 1,000 colonies/mL insignificant growth Sputum 4/15 >> normal flora 4/10 Influenza panel (-) 4/10 Strep pneumo urinary antigen (-) 4/10 Legionella urinary antigen (-) 4/10 MRSA PCR (-)  ANTIBIOTICS: 4/10 Levaquin>>4/17 4/10 Vancomycin >>4/11 4/10 Primaxin >>4/11 4/15 Vancomycin restarted >>4/17  SIGNIFICANT EVENTS: 4/15 transfer to [facility]  4/17- precedex added 4/18 extubated  LINES/TUBES: 4/15 Right IJ HD catheter Foley catheter OG tube ETT > 4/18 PIV x 2 4/15 ett>>>4/18  Subjective: Patient is uncooperative. Does not open her eyes. Seems to be somewhat agitated. Unable to obtain any information from her.  Objective:  Vital Signs  Filed Vitals:   12/17/15 2024 12/18/15 0510 12/18/15 0608 12/18/15 0654  BP: 155/79 155/137  142/91  Pulse: 112 92  91  Temp: 98.5 F (36.9 C) 98.4 F (36.9 C)    TempSrc: Axillary Oral    Resp: 18 18    Height:      Weight:   91 kg (200 lb 9.9 oz)   SpO2:  99%      Intake/Output Summary (Last 24 hours) at 12/18/15 1100 Last data filed at 12/18/15 0710  Gross per 24 hour  Intake   2385 ml  Output   2325 ml  Net     60 ml   Filed Weights   12/16/15 0251 12/17/15 0448 12/18/15 0608  Weight: 93.8 kg (206 lb 12.7 oz) 91.4 kg (201 lb 8 oz) 91 kg (200 lb 9.9 oz)    General appearance: no distress, uncooperative and Eyes closed Resp: Diminished air entry at the bases without any definite crackles, wheezing or rhonchi. Cardio: regular rate and rhythm, S1, S2 normal,  no murmur, click, rub or gallop GI: soft, non-tender; bowel sounds normal; no masses,  no organomegaly Extremities: extremities normal, atraumatic, no cyanosis or edema Neurologic: Patient is noted to be agitated. Eyes closed. Moving all her extremities.  Lab Results:  Data Reviewed: I have personally reviewed following labs and imaging studies  CBC:  Recent Labs Lab 12/12/15 0512  12/14/15 0550 12/15/15 0500 12/16/15 0410 12/17/15 0455  12/18/15 0504  WBC 15.4*  < > 15.8* 10.8* 5.2 5.8 7.3  NEUTROABS 12.6*  --   --   --   --   --   --   HGB 11.6*  < > 9.0* 8.7* 8.5* 8.8* 9.0*  HCT 36.7  < > 28.6* 27.7* 26.6* 28.3* 29.6*  MCV 104.6*  < > 103.2* 101.5* 100.8* 102.9* 103.9*  PLT 160  < > 88* 70* 38* 44* 57*  < > = values in this interval not displayed.  Basic Metabolic Panel:  Recent Labs Lab 12/15/15 0500  12/16/15 0410 12/16/15 0412 12/16/15 1648 12/17/15 0455 12/17/15 1657 12/18/15 0504  NA 146*  --  149*  --   --  150* 149* 146*  K 3.2*  --  3.4*  --   --  3.8 3.9 3.9  CL 112*  --  112*  --   --  109 105 103  CO2 25  --  27  --   --  32 34* 33*  GLUCOSE 200*  --  155*  --   --  113* 96 92  BUN 85*  --  76*  --   --  75* 80* 87*  CREATININE 3.88*  --  3.62*  --   --  2.99* 3.09* 3.35*  CALCIUM 8.1*  --  8.5*  --   --  8.7* 8.7* 8.7*  MG  --   < >  --  1.5* 2.2 1.8 1.7 1.7  PHOS 4.3  < >  --  4.2 4.6 4.5  4.5 4.9* 5.0*  4.8*  < > = values in this interval not displayed.  GFR: Estimated Creatinine Clearance: 21.5 mL/min (by C-G formula based on Cr of 3.35).  Liver Function Tests:  Recent Labs Lab 12/13/15 0509 12/14/15 0550 12/15/15 0500 12/17/15 0455 12/18/15 0504  AST  --   --  14*  --   --   ALT  --   --  12*  --   --   ALKPHOS  --   --  61  --   --   BILITOT  --   --  0.7  --   --   PROT  --   --  5.0*  --   --   ALBUMIN 2.0* 1.5* 1.6* 1.6* 1.6*   Cardiac Enzymes:  Recent Labs Lab 12/12/15 1235  CKTOTAL 149   CBG:  Recent Labs Lab 12/17/15 1118 12/17/15 1518 12/18/15 0019 12/18/15 0420 12/18/15 0754  GLUCAP 162* 88 143* 101* 88    Urine analysis:    Component Value Date/Time   COLORURINE YELLOW 12/12/2015 1245   APPEARANCEUR CLOUDY* 12/12/2015 1245   LABSPEC 1.013 12/12/2015 1245   PHURINE 5.5 12/12/2015 1245   GLUCOSEU NEGATIVE 12/12/2015 1245   HGBUR MODERATE* 12/12/2015 1245   BILIRUBINUR NEGATIVE 12/12/2015 1245   KETONESUR NEGATIVE 12/12/2015 1245   PROTEINUR  100* 12/12/2015 1245   UROBILINOGEN 0.2 06/28/2015 0343   NITRITE NEGATIVE 12/12/2015 1245   LEUKOCYTESUR NEGATIVE 12/12/2015 1245    Recent Results (from the past 240 hour(s))  Culture, blood (routine x 2) Call MD if unable to obtain prior to antibiotics being given     Status: None   Collection Time: 12/08/15  3:21 PM  Result Value Ref Range Status   Specimen Description BLOOD LEFT ANTECUBITAL  Final   Special Requests BOTTLES DRAWN AEROBIC AND ANAEROBIC 5CC  Final   Culture   Final    NO GROWTH 5 DAYS Performed at [facility]    Report Status 12/13/2015 FINAL  Final  Culture, blood (routine x 2) Call MD if unable to obtain prior to antibiotics being given     Status: None   Collection Time: 12/08/15  4:06 PM  Result Value Ref Range Status   Specimen Description BLOOD LEFT FOREARM  Final   Special Requests IN PEDIATRIC BOTTLE [facility]  Final   Culture   Final    NO GROWTH 5 DAYS Performed at [facility]    Report Status 12/13/2015 FINAL  Final  MRSA PCR Screening     Status: None   Collection Time: 12/08/15 10:52 PM  Result Value Ref Range Status   MRSA by PCR NEGATIVE NEGATIVE Final    Comment:        The GeneXpert MRSA Assay (FDA approved for NASAL specimens only), is one component of a comprehensive MRSA colonization surveillance program. It is not intended to diagnose MRSA infection nor to guide or monitor treatment for MRSA infections.   Urine culture     Status: Abnormal   Collection Time: 12/08/15 11:26 PM  Result Value Ref Range Status   Specimen Description URINE, CLEAN CATCH  Final   Special Requests NONE  Final   Culture (A)  Final    1,000 COLONIES/mL INSIGNIFICANT GROWTH Performed at [facility]    Report Status 12/10/2015 FINAL  Final  Culture, expectorated sputum-assessment     Status: None   Collection Time: 12/13/15  4:55 PM  Result Value Ref Range Status   Specimen Description SPUTUM  Final   Special Requests NONE  Final    Sputum evaluation   Final    THIS SPECIMEN IS ACCEPTABLE. RESPIRATORY CULTURE REPORT TO FOLLOW.   Report Status 12/13/2015 FINAL  Final  Culture, respiratory (NON-Expectorated)     Status: None   Collection Time: 12/13/15  4:55 PM  Result Value Ref Range Status   Specimen Description SPUTUM  Final   Special Requests NONE  Final   Gram Stain   Final    ABUNDANT WBC PRESENT, PREDOMINANTLY PMN FEW SQUAMOUS EPITHELIAL CELLS PRESENT ABUNDANT GRAM POSITIVE COCCI IN PAIRS IN CLUSTERS Performed at [facility]    Culture   Final    NORMAL OROPHARYNGEAL FLORA Performed at [facility]    Report Status 12/17/2015 FINAL  Final      Radiology Studies: No results found.   Medications:  Scheduled: . amantadine  100 mg Oral BID  . antiseptic oral rinse  7 mL Mouth Rinse q12n4p  . ARIPiprazole  15 mg Oral BH-qamhs  . benztropine  0.5 mg Oral BID  . feeding supplement (PRO-STAT SUGAR FREE 64)  30 mL Per Tube BID  . feeding supplement (VITAL HIGH PROTEIN)  1,000 mL Per Tube Q24H  . free water  300 mL Per Tube Q6H  . guaiFENesin  15 mL Oral Q6H  . haloperidol lactate  2.5 mg Intravenous Q6H  . insulin aspart  0-9 Units Subcutaneous Q4H  . levothyroxine  37.5 mcg Intravenous Daily  . pantoprazole sodium  40 mg Per Tube Q1200  . sodium chloride  500 mL Intravenous Once  . sodium chloride flush  3 mL Intravenous Q12H  . sodium chloride flush  3 mL Intravenous Q12H  . valproic acid  750 mg Oral TID   Continuous: . dextrose 100 mL/hr at 12/17/15 1137   [id number] (TYLENOL) oral liquid 160 mg/5 mL, acetaminophen **OR** acetaminophen, albuterol, budesonide (PULMICORT) nebulizer solution, diphenhydrAMINE, ondansetron **OR** ondansetron (ZOFRAN) IV, oxyCODONE, polyethylene glycol, RESOURCE THICKENUP CLEAR  Assessment/Plan:  Principal Problem:   Bipolar disorder, current episode manic severe with psychotic features ([place]) Active Problems:   HTN (hypertension)    COPD (chronic obstructive pulmonary disease) (H[facility])   Chronic kidney disease   Prediabetes   COPD with acute exacerbation ([place])   HCAP (healthcare-associated pneumonia)   Renal failure   Acute encephalopathy   Sepsis ([place])   Acute respiratory failure (H[facility])    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 Patient's renal function continued to get worse. Nephrology was consulted. Renal ultrasound did not show any hydronephrosis. Nephrology recommended transferring the patient to [facility] to undergo dialysis. Patient was admitted to the intensive care unit. She was intubated so that she could undergo placement of dialysis catheter. Nephrology is following closely. Potassium is normal. Creatinine has been climbing up over the last 2 days.  Acute encephalopathy Patient has a significant history of mental health disorders. She has bipolar and possibly even psychosis. She has been admitted to behavioral health in the past. No family is available. CT head did not show any acute findings. Patient remains agitated. We will consult psychiatry to reassess her. Continue her psychotropic medications. Consider checking cortisol level.  COPD exacerbation Suspect precipitated by infectious process as chest x-ray showed patchy bilateral lower lobe pneumonia. Now improved. Off of steroids.  Community acquire pneumonia.  Patient presented with complaints of shortness of breath and cough with chest x-ray showing findings consistent with pneumonia. No clear-cut evidence for sepsis. Patient was given antibiotics. She has completed course.  Bipolar disorder With psychotic features and recurrent manic episodes in recent past. She is on multiple psychotropic medications. She is on Haldol intravenously. Psychiatry to assist with management.  Transient Hypotension Noted  while she was in the intensive care unit. She required pressors. Stable now. Etiology is unclear.  Thrombocytopenia Etiology is unclear. Counts improved this morning. No overt bleeding. Continue to monitor. No heparin products.  Macrocytic anemia Hemoglobin is stable. TSH was slightly elevated. Free T4 is normal. Will check 0000000 and folic acid levels.  History of hypothyroidism Resume Synthroid when she is able to take orally.  Nutrition Currently getting feeds through NG tube. Speech therapy is following.  DVT Prophylaxis: SCDs    Code Status: Full code  Family Communication: No family is available  Disposition Plan: Await improvement. Await stabilization of renal failure. Mobilize.    LOS: 10 days   [facility] Pager [phone] 12/18/2015, 11:00 AM  If 7PM-7AM, please contact night-coverage at [email or web address], password [facility]

## 2015-12-18 NOTE — Progress Notes (Signed)
Patient transferred from 70M.  Bed rails up x2, bed alarm on. Tele box 23 placed on patient.  Patient belongings brought from 70M and placed in the room closet.  Patient very confused and aggitatied upon arrival. Unit instruction attempted however, the patient showed no evidence of learning.  Call light and phone within reach.

## 2015-12-18 NOTE — Progress Notes (Signed)
Nutrition Follow-up  DOCUMENTATION CODES:   Obesity unspecified  INTERVENTION:   -Magic Cup TID with meals  NUTRITION DIAGNOSIS:   Inadequate oral intake related to lethargy/confusion, dysphagia as evidenced by meal completion < 25%.  Ongoing  GOAL:   Patient will meet greater than or equal to 90% of their needs  Unmet  MONITOR:   PO intake, Supplement acceptance, Diet advancement, Weight trends, Skin, Labs, I & O's  REASON FOR ASSESSMENT:   Consult Enteral/tube feeding initiation and management  ASSESSMENT:   55 year old female with a history of COPD, hypertension, chronic kidney disease stage III, bipolar disorder, who presents to the ER because of malaise and shortness of breath and cough for the last 2 weeks. She has a history of COPD and smokes. Her MDI inhaler have not provided her any relief from her shortness of breath. She denies any fever chills, rigors. Patient has significant difficulty communicating because of her bipolar disorder. However she is awake alert but disoriented. He is requesting her medications repeatedly. Therefore the patient's history is obtained from the chart. Patient is a resident of group home [facility].  Pt extubated on 12/16/15 and transferred from ICU to medical floor on 12/17/15.   Pt was initially seen for BSE on 12/17/15 and was too agitated for PO trials, so it was recommended that pt remain NPO. Pt was advanced to a dysphagia 1 diet with nectar thick liquids. Meal completion poor; PO: 25%. Suspect poor oral intake will continue, due to diet restrictions, agitation, and mental illness.   Labs reviewed: Na: 146, Phos: 5.0, CBGS: 88-143.  Diet Order:  DIET - DYS 1 Room service appropriate?: Yes; Fluid consistency:: Nectar Thick  Skin:  Reviewed, no issues  Last BM:  12/16/15  Height:   Ht Readings from Last 1 Encounters:  12/13/15 5\' 7"  (1.702 m)    Weight:   Wt Readings from Last 1 Encounters:   12/18/15 200 lb 9.9 oz (91 kg)    Ideal Body Weight:  56.82 kg (kg)  BMI:  Body mass index is 31.41 kg/(m^2).  Estimated Nutritional Needs:   Kcal:  1600-1800  Protein:  70-80 grams  Fluid:  1.6-1.8 L  EDUCATION NEEDS:   No education needs identified at this time  Deanta Mincey A. Jimmye Norman, RD, LDN, CDE Pager: [phone] After hours Pager: [phone]

## 2015-12-19 DIAGNOSIS — D539 Nutritional anemia, unspecified: Secondary | ICD-10-CM

## 2015-12-19 LAB — GLUCOSE, CAPILLARY
GLUCOSE-CAPILLARY: 154 mg/dL — AB (ref 65–99)
GLUCOSE-CAPILLARY: 57 mg/dL — AB (ref 65–99)
Glucose-Capillary: 172 mg/dL — ABNORMAL HIGH (ref 65–99)
Glucose-Capillary: 109 mg/dL — ABNORMAL HIGH (ref 65–99)
Glucose-Capillary: 152 mg/dL — ABNORMAL HIGH (ref 65–99)
Glucose-Capillary: 100 mg/dL — ABNORMAL HIGH (ref 65–99)
Glucose-Capillary: 104 mg/dL — ABNORMAL HIGH (ref 65–99)

## 2015-12-19 LAB — RENAL FUNCTION PANEL
ALBUMIN: 1.6 g/dL — AB (ref 3.5–5.0)
Anion gap: 10 (ref 5–15)
BUN: 81 mg/dL — AB (ref 6–20)
CO2: 32 mmol/L (ref 22–32)
CREATININE: 3.38 mg/dL — AB (ref 0.44–1.00)
Calcium: 8.8 mg/dL — ABNORMAL LOW (ref 8.9–10.3)
Chloride: 104 mmol/L (ref 101–111)
GFR, EST AFRICAN AMERICAN: 16 mL/min — AB (ref >60)
GFR, EST NON AFRICAN AMERICAN: 14 mL/min — AB (ref >60)
Glucose, Bld: 66 mg/dL (ref 65–99)
PHOSPHORUS: 4.8 mg/dL — AB (ref 2.5–4.6)
Potassium: 3.5 mmol/L (ref 3.5–5.1)
Sodium: 146 mmol/L — ABNORMAL HIGH (ref 135–145)

## 2015-12-19 LAB — CBC
HCT: 26.5 % — ABNORMAL LOW (ref 36.0–46.0)
Hemoglobin: 8.3 g/dL — ABNORMAL LOW (ref 12.0–15.0)
MCH: 32.2 pg (ref 26.0–34.0)
MCHC: 31.3 g/dL (ref 30.0–36.0)
MCV: 102.7 fL — ABNORMAL HIGH (ref 78.0–100.0)
PLATELETS: 62 10*3/uL — AB (ref 150–400)
RBC: 2.58 MIL/uL — AB (ref 3.87–5.11)
RDW: 13.2 % (ref 11.5–15.5)
WBC: 6 10*3/uL (ref 4.0–10.5)

## 2015-12-19 LAB — CORTISOL-AM, BLOOD: Cortisol - AM: 13.2 ug/dL (ref 6.7–22.6)

## 2015-12-19 LAB — VITAMIN B12: Vitamin B-12: 5007 pg/mL — ABNORMAL HIGH (ref 180–914)

## 2015-12-19 LAB — FOLATE: FOLATE: 26 ng/mL (ref >5.9)

## 2015-12-19 LAB — RETICULOCYTES
RBC.: 2.58 MIL/uL — ABNORMAL LOW (ref 3.87–5.11)
Retic Count, Absolute: 31 10*3/uL (ref 19.0–186.0)
Retic Ct Pct: 1.2 % (ref 0.4–3.1)

## 2015-12-19 LAB — FERRITIN: FERRITIN: 470 ng/mL — AB (ref 11–307)

## 2015-12-19 MED ORDER — DEXTROSE 50 % IV SOLN
INTRAVENOUS | Status: AC
  Administered 2015-12-19: 50 mL
  Filled 2015-12-19: qty 50

## 2015-12-19 NOTE — Progress Notes (Signed)
CSW spoke with [name] from [facility]. She says she will do what she can to help patient transition to SNF.  [name] LCSWA [phone]

## 2015-12-19 NOTE — Progress Notes (Signed)
[facility] PROGRESS NOTE  [name] [id number] DOB: 03-15-1961 DOA: 12/08/2015  PCP: Benito Mccreedy, MD  Brief HPI: 55 yo female with bipolar disorder, [psychosis was admitted with weakness and cough. Her renal function declined and she was moved to [facility] for PCCM to place HD cath. She is not competent to sign for procedure and is estranged from her family due to life long mental illness. She was unable to cooperate for HD insertion therefore she was moved to ICU, sedated, intubated and HD cath placed. She was dialyzed by nephrology. Patient was subsequently extubated.   Past medical history:  Past Medical History  Diagnosis Date  . Anemia 07/10/2011  . Thrombocytopenia ([place]) 07/10/2011  . HTN (hypertension)   . COPD (chronic obstructive pulmonary disease) ([place])   . Hyperlipidemia   . Hypothyroidism   . Bipolar affective ([place])   . DM (diabetes mellitus) ([place])   . Renal insufficiency   . 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 Hypertension     Consultants: Nephrology,   STUDIES:  4/10 CXR > Patchy bilateral lower lobe atelectasis or pneumonia. Mild cardiomegaly. 4/11 CXR > Interim partial clearing of bibasilar atelectasis and/or infiltrates. Cardiomegaly. 4/12 CT Head w/o Contrast > Mild diffuse atrophy for age. No intracranial mass, hemorrhage, or focal gray - white compartment lesion. Extensive mastoid air cell disease bilaterally as well as multifocal paranasal sinus disease. 4/14 Renal US > Increased echotexture within the kidneys bilaterally compatible chronic medical renal disease. 6.2 cm minimally complex cyst in the left kidney. No hydronephrosis. 4/15 CXR > Low lung volumes with probable bibasilar subsegmental atelectasis and small left pleural effusion. 4/18 CXR > Slight interval deterioration in the appearance of the chest with worsening of interstitial edema and small pleural effusions  CULTURES: Blood 4/10 >> NG  final Urine 4/10 >> 1,000 colonies/mL insignificant growth Sputum 4/15 >> normal flora 4/10 Influenza panel (-) 4/10 Strep pneumo urinary antigen (-) 4/10 Legionella urinary antigen (-) 4/10 MRSA PCR (-)  ANTIBIOTICS: 4/10 Levaquin>>4/17 4/10 Vancomycin >>4/11 4/10 Primaxin >>4/11 4/15 Vancomycin restarted >>4/17  SIGNIFICANT EVENTS: 4/15 transfer to [facility]  4/17- precedex added 4/18 extubated  LINES/TUBES: 4/15 Right IJ HD catheter Foley catheter OG tube ETT > 4/18 PIV x 2 4/15 ett>>>4/18  Subjective: Patient remains delirious. Does not answer questions appropriately.  Objective:  Vital Signs  Filed Vitals:   12/18/15 0654 12/18/15 1431 12/18/15 2254 12/19/15 0539  BP: 142/91 118/64 123/72 129/66  Pulse: 91 95 95 89  Temp:  98.2 F (36.8 C) 97.7 F (36.5 C) 98.2 F (36.8 C)  TempSrc:  Oral Oral Oral  Resp:  18 18 18   Height:      Weight:    93.3 kg (205 lb 11 oz)  SpO2:  93% 93% 93%    Intake/Output Summary (Last 24 hours) at 12/19/15 1039 Last data filed at 12/19/15 [id number]  Gross per 24 hour  Intake 3783.33 ml  Output   2100 ml  Net 1683.33 ml   Filed Weights   12/17/15 0448 12/18/15 0608 12/19/15 0539  Weight: 91.4 kg (201 lb 8 oz) 91 kg (200 lb 9.9 oz) 93.3 kg (205 lb 11 oz)    General appearance: Agitated. No distress. Resp: Diminished air entry at the bases without any definite crackles, wheezing or rhonchi. Cardio: regular rate and rhythm, S1, S2 normal, no murmur, click, rub or gallop GI: soft, non-tender; bowel sounds normal; no masses,  no organomegaly Neurologic:  Patient is noted to be agitated. Eyes open today. Occasionally tries to get out of the bed. Moving all her extremities.   Lab Results:  Data Reviewed: I have personally reviewed following labs and imaging studies  CBC:  Recent Labs Lab 12/15/15 0500 12/16/15 0410 12/17/15 0455 12/18/15 0504 12/19/15 0415  WBC 10.8* 5.2 5.8 7.3 6.0  HGB 8.7* 8.5* 8.8* 9.0* 8.3*  HCT  27.7* 26.6* 28.3* 29.6* 26.5*  MCV 101.5* 100.8* 102.9* 103.9* 102.7*  PLT 70* 38* 44* 57* 62*    Basic Metabolic Panel:  Recent Labs Lab 12/16/15 0410  12/16/15 1648 12/17/15 0455 12/17/15 1657 12/18/15 0504 12/18/15 1636 12/19/15 0415  NA 149*  --   --  150* 149* 146*  --  146*  K 3.4*  --   --  3.8 3.9 3.9  --  3.5  CL 112*  --   --  109 105 103  --  104  CO2 27  --   --  32 34* 33*  --  32  GLUCOSE 155*  --   --  113* 96 92  --  66  BUN 76*  --   --  75* 80* 87*  --  81*  CREATININE 3.62*  --   --  2.99* 3.09* 3.35*  --  3.38*  CALCIUM 8.5*  --   --  8.7* 8.7* 8.7*  --  8.8*  MG  --   < > 2.2 1.8 1.7 1.7 1.8  --   PHOS  --   < > 4.6 4.5  4.5 4.9* 5.0*  4.8* 5.2* 4.8*  < > = values in this interval not displayed.  GFR: Estimated Creatinine Clearance: 21.5 mL/min (by C-G formula based on Cr of 3.38).  Liver Function Tests:  Recent Labs Lab 12/14/15 0550 12/15/15 0500 12/17/15 0455 12/18/15 0504 12/19/15 0415  AST  --  14*  --   --   --   ALT  --  12*  --   --   --   ALKPHOS  --  61  --   --   --   BILITOT  --  0.7  --   --   --   PROT  --  5.0*  --   --   --   ALBUMIN 1.5* 1.6* 1.6* 1.6* 1.6*   Cardiac Enzymes:  Recent Labs Lab 12/12/15 1235  CKTOTAL 149   CBG:  Recent Labs Lab 12/18/15 2047 12/18/15 2352 12/19/15 0420 12/19/15 0454 12/19/15 0803  GLUCAP 100* 154* 57* 172* 109*    Urine analysis:    Component Value Date/Time   COLORURINE YELLOW 12/12/2015 1245   APPEARANCEUR CLOUDY* 12/12/2015 1245   LABSPEC 1.013 12/12/2015 1245   PHURINE 5.5 12/12/2015 1245   GLUCOSEU NEGATIVE 12/12/2015 1245   HGBUR MODERATE* 12/12/2015 1245   BILIRUBINUR NEGATIVE 12/12/2015 1245   KETONESUR NEGATIVE 12/12/2015 1245   PROTEINUR 100* 12/12/2015 1245   UROBILINOGEN 0.2 06/28/2015 0343   NITRITE NEGATIVE 12/12/2015 1245   LEUKOCYTESUR NEGATIVE 12/12/2015 1245    Recent Results (from the past 240 hour(s))  Culture, expectorated sputum-assessment      Status: None   Collection Time: 12/13/15  4:55 PM  Result Value Ref Range Status   Specimen Description SPUTUM  Final   Special Requests NONE  Final   Sputum evaluation   Final    THIS SPECIMEN IS ACCEPTABLE. RESPIRATORY CULTURE REPORT TO FOLLOW.   Report Status 12/13/2015 FINAL  Final  Culture, respiratory (  NON-Expectorated)     Status: None   Collection Time: 12/13/15  4:55 PM  Result Value Ref Range Status   Specimen Description SPUTUM  Final   Special Requests NONE  Final   Gram Stain   Final    ABUNDANT WBC PRESENT, PREDOMINANTLY PMN FEW SQUAMOUS EPITHELIAL CELLS PRESENT ABUNDANT GRAM POSITIVE COCCI IN PAIRS IN CLUSTERS Performed at [facility]    Culture   Final    NORMAL OROPHARYNGEAL FLORA Performed at [facility]    Report Status 12/17/2015 FINAL  Final      Radiology Studies: No results found.   Medications:  Scheduled: . amantadine  100 mg Oral BID  . antiseptic oral rinse  7 mL Mouth Rinse q12n4p  . ARIPiprazole  15 mg Oral BH-qamhs  . benztropine  0.5 mg Oral BID  . free water  300 mL Per Tube Q6H  . guaiFENesin  15 mL Oral Q6H  . haloperidol lactate  2.5 mg Intravenous Q6H  . insulin aspart  0-9 Units Subcutaneous Q4H  . levothyroxine  75 mcg Oral QAC breakfast  . pantoprazole sodium  40 mg Per Tube Q1200  . sodium chloride  500 mL Intravenous Once  . sodium chloride flush  3 mL Intravenous Q12H  . sodium chloride flush  3 mL Intravenous Q12H  . valproic acid  750 mg Oral TID   Continuous: . dextrose 100 mL/hr at 12/19/15 [id number]   [id number] (TYLENOL) oral liquid 160 mg/5 mL, acetaminophen **OR** acetaminophen, albuterol, budesonide (PULMICORT) nebulizer solution, ondansetron **OR** ondansetron (ZOFRAN) IV, polyethylene glycol, RESOURCE THICKENUP CLEAR, sodium chloride flush  Assessment/Plan:  Principal Problem:   Bipolar disorder, current episode manic severe with psychotic features ([place]) Active Problems:   HTN  (hypertension)   COPD (chronic obstructive pulmonary disease) (HCC)   Chronic kidney disease   Prediabetes   COPD with acute exacerbation ([place])   HCAP (healthcare-associated pneumonia)   Renal failure   Acute encephalopathy   Sepsis ([place])   Acute respiratory failure ([place])    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 Patient's renal function continued to get worse. Nephrology was consulted. Renal ultrasound did not show any hydronephrosis. Nephrology recommended transferring the patient to [facility] to undergo dialysis. Patient was admitted to the intensive care unit. She was intubated so that she could undergo placement of dialysis catheter. Renal function is stable. Patient has good urine output. Nephrology is following. Potassium is normal.   Acute encephalopathy/delirium Patient has a significant history of mental health disorders. She has bipolar disorder and schizoaffective disorder. She has been admitted to behavioral health in the past. No family is available. CT head did not show any acute findings. Patient remains agitated. He can't raise following. It appears that her delirium is mainly due to medical reasons. Her underlying psychiatric illness is likely contributing as well. Continue her psychotropic medications. Cortisol level is normal.  COPD exacerbation, resolved Suspect precipitated by infectious process as chest x-ray showed patchy bilateral lower lobe pneumonia. Now improved. Off of steroids.  Community acquire pneumonia.  Patient presented with complaints of shortness of breath and cough with chest x-ray showing findings consistent with pneumonia. No clear-cut evidence for sepsis. Patient was given antibiotics. She has completed course.  Bipolar disorder/schizoaffective disorder Secondary following. She is on multiple  psychotropic medications. She is on Haldol intravenously.  Transient Hypotension Noted while  she was in the intensive care unit. She required pressors. Stable now. Etiology is unclear.  Thrombocytopenia Etiology is unclear. Counts continue to improve. No overt bleeding. Continue to monitor. No heparin products.  Macrocytic anemia Hemoglobin is stable. TSH was slightly elevated. Free T4 is normal. B-12 level is high and folic acid level is normal.   History of hypothyroidism Continue levothyroxin  Nutrition Patient pulled out her NG tube yesterday. Oral diet.  DVT Prophylaxis: SCDs    Code Status: Full code  Family Communication: No family is available  Disposition Plan: Await improvement in mental status. Will need placement.    LOS: 11 days   [facility] Pager [phone] 12/19/2015, 10:39 AM  If 7PM-7AM, please contact night-coverage at [email or web address], password [facility]

## 2015-12-19 NOTE — Progress Notes (Signed)
[facility] ROUNDING NOTE   Subjective:   Interval History:  Continues to have great urine output (2.1L UOP/24 hours)   Objective:  Vital signs in last 24 hours:  Temp:  [97.7 F (36.5 C)-98.2 F (36.8 C)] 98.2 F (36.8 C) (04/21 0539) Pulse Rate:  [89-95] 89 (04/21 0539) Resp:  [18] 18 (04/21 0539) BP: (118-129)/(64-72) 129/66 mmHg (04/21 0539) SpO2:  [93 %] 93 % (04/21 0539) Weight:  [205 lb 11 oz (93.3 kg)] 205 lb 11 oz (93.3 kg) (04/21 0539)  Weight change: 5 lb 1.1 oz (2.3 kg) Filed Weights   12/17/15 0448 12/18/15 0608 12/19/15 0539  Weight: 201 lb 8 oz (91.4 kg) 200 lb 9.9 oz (91 kg) 205 lb 11 oz (93.3 kg)    Intake/Output: I/O last 3 completed shifts: In: 4383.3 [P.O.:270; I.V.:3513.3; NG/GT:600] Out: 2100 [Urine:2100]   Intake/Output this shift:  Total I/O In: -  Out: 500 [Urine:500]   Gen: resting in bed, NAD, sitter at bedside CVS- RRR RS- normal WOB on 3L O2  ABD- + BS, soft, non-distended EXT- no LE edema  Basic Metabolic Panel:  Recent Labs Lab 12/16/15 0410  12/16/15 1648 12/17/15 0455 12/17/15 1657 12/18/15 0504 12/18/15 1636 12/19/15 0415  NA 149*  --   --  150* 149* 146*  --  146*  K 3.4*  --   --  3.8 3.9 3.9  --  3.5  CL 112*  --   --  109 105 103  --  104  CO2 27  --   --  32 34* 33*  --  32  GLUCOSE 155*  --   --  113* 96 92  --  66  BUN 76*  --   --  75* 80* 87*  --  81*  CREATININE 3.62*  --   --  2.99* 3.09* 3.35*  --  3.38*  CALCIUM 8.5*  --   --  8.7* 8.7* 8.7*  --  8.8*  MG  --   < > 2.2 1.8 1.7 1.7 1.8  --   PHOS  --   < > 4.6 4.5  4.5 4.9* 5.0*  4.8* 5.2* 4.8*  < > = values in this interval not displayed.  Liver Function Tests:  Recent Labs Lab 12/14/15 0550 12/15/15 0500 12/17/15 0455 12/18/15 0504 12/19/15 0415  AST  --  14*  --   --   --   ALT  --  12*  --   --   --   ALKPHOS  --  61  --   --   --   BILITOT  --  0.7  --   --   --   PROT  --  5.0*  --   --   --   ALBUMIN 1.5* 1.6* 1.6*  1.6* 1.6*   No results for input(s): LIPASE, AMYLASE in the last 168 hours. No results for input(s): AMMONIA in the last 168 hours.  CBC:  Recent Labs Lab 12/15/15 0500 12/16/15 0410 12/17/15 0455 12/18/15 0504 12/19/15 0415  WBC 10.8* 5.2 5.8 7.3 6.0  HGB 8.7* 8.5* 8.8* 9.0* 8.3*  HCT 27.7* 26.6* 28.3* 29.6* 26.5*  MCV 101.5* 100.8* 102.9* 103.9* 102.7*  PLT 70* 38* 44* 57* 62*    Cardiac Enzymes:  Recent Labs Lab 12/12/15 1235  CKTOTAL 149    BNP: Invalid input(s): POCBNP  CBG:  Recent Labs Lab 12/18/15 2047 12/18/15 2352 12/19/15 0420 12/19/15 0454 12/19/15 0803  GLUCAP 100* 154*  66* 172* 109*    Microbiology: Results for orders placed or performed during the hospital encounter of 12/08/15  Culture, blood (routine x 2) Call MD if unable to obtain prior to antibiotics being given     Status: None   Collection Time: 12/08/15  3:21 PM  Result Value Ref Range Status   Specimen Description BLOOD LEFT ANTECUBITAL  Final   Special Requests BOTTLES DRAWN AEROBIC AND ANAEROBIC 5CC  Final   Culture   Final    NO GROWTH 5 DAYS Performed at [facility]    Report Status 12/13/2015 FINAL  Final  Culture, blood (routine x 2) Call MD if unable to obtain prior to antibiotics being given     Status: None   Collection Time: 12/08/15  4:06 PM  Result Value Ref Range Status   Specimen Description BLOOD LEFT FOREARM  Final   Special Requests IN PEDIATRIC BOTTLE [facility]  Final   Culture   Final    NO GROWTH 5 DAYS Performed at [facility]    Report Status 12/13/2015 FINAL  Final  MRSA PCR Screening     Status: None   Collection Time: 12/08/15 10:52 PM  Result Value Ref Range Status   MRSA by PCR NEGATIVE NEGATIVE Final    Comment:        The GeneXpert MRSA Assay (FDA approved for NASAL specimens only), is one component of a comprehensive MRSA colonization surveillance program. It is not intended to diagnose MRSA infection nor to guide or monitor  treatment for MRSA infections.   Urine culture     Status: Abnormal   Collection Time: 12/08/15 11:26 PM  Result Value Ref Range Status   Specimen Description URINE, CLEAN CATCH  Final   Special Requests NONE  Final   Culture (A)  Final    1,000 COLONIES/mL INSIGNIFICANT GROWTH Performed at [facility]    Report Status 12/10/2015 FINAL  Final  Culture, expectorated sputum-assessment     Status: None   Collection Time: 12/13/15  4:55 PM  Result Value Ref Range Status   Specimen Description SPUTUM  Final   Special Requests NONE  Final   Sputum evaluation   Final    THIS SPECIMEN IS ACCEPTABLE. RESPIRATORY CULTURE REPORT TO FOLLOW.   Report Status 12/13/2015 FINAL  Final  Culture, respiratory (NON-Expectorated)     Status: None   Collection Time: 12/13/15  4:55 PM  Result Value Ref Range Status   Specimen Description SPUTUM  Final   Special Requests NONE  Final   Gram Stain   Final    ABUNDANT WBC PRESENT, PREDOMINANTLY PMN FEW SQUAMOUS EPITHELIAL CELLS PRESENT ABUNDANT GRAM POSITIVE COCCI IN PAIRS IN CLUSTERS Performed at [facility]    Culture   Final    NORMAL OROPHARYNGEAL FLORA Performed at [facility]    Report Status 12/17/2015 FINAL  Final    Coagulation Studies: No results for input(s): LABPROT, INR in the last 72 hours.  Urinalysis: No results for input(s): COLORURINE, LABSPEC, PHURINE, GLUCOSEU, HGBUR, BILIRUBINUR, KETONESUR, PROTEINUR, UROBILINOGEN, NITRITE, LEUKOCYTESUR in the last 72 hours.  Invalid input(s): APPERANCEUR    Imaging: No results found.   Medications:   . dextrose 100 mL/hr at 12/19/15 0917   . amantadine  100 mg Oral BID  . antiseptic oral rinse  7 mL Mouth Rinse q12n4p  . ARIPiprazole  15 mg Oral BH-qamhs  . benztropine  0.5 mg Oral BID  . free water  300 mL  Per Tube Q6H  . guaiFENesin  15 mL Oral Q6H  . haloperidol lactate  2.5 mg Intravenous Q6H  . insulin aspart  0-9 Units Subcutaneous Q4H  .  levothyroxine  75 mcg Oral QAC breakfast  . pantoprazole sodium  40 mg Per Tube Q1200  . sodium chloride  500 mL Intravenous Once  . sodium chloride flush  3 mL Intravenous Q12H  . sodium chloride flush  3 mL Intravenous Q12H  . valproic acid  750 mg Oral TID   acetaminophen (TYLENOL) oral liquid 160 mg/5 mL, acetaminophen **OR** acetaminophen, albuterol, budesonide (PULMICORT) nebulizer solution, ondansetron **OR** ondansetron (ZOFRAN) IV, polyethylene glycol, RESOURCE THICKENUP CLEAR, sodium chloride flush  Assessment/ Plan:  1 AKI ACEI/NSAID; Potassium stable 3.5. Cr stable 3.38.  Continues to have good UOP, 2.1L in UOP/24 hours.  2 CKD 4 lowers likelihood of recovery not candidate for long term dialysis with advanced psychosis and inability to control with antipsychotics 3 Anemia: hgb 8.3 4 bipolar 5 ? schizo 6 COPD 7 Resp per CCM MS primary issue 8 HTN not an issue 9 HPTH    Patient not a good candidate for long term HD.  As there is very little to add at this time, will sign off.  Please do not hesitate to re-consult if we can be of further assistance.   LOS: [place], DO 12/17/1708:52 AM

## 2015-12-19 NOTE — Progress Notes (Signed)
Physical Therapy Treatment Patient Details Name: [name] MRN: [id number] DOB: 11/01/1960 Today's Date: 12/19/2015    History of Present Illness pt presents with Acute on Chronic Renal Failure and Metabolic Encephalopathy.  pt with hx of Bipolar Schizoaffective Disorder, COPD, HTN, and CKD.      PT Comments    Pt is having trouble with instruction as she is not responding to PT with any follow through now.  Will reattempt therapy but if not responding to therapist may be appropriate for DC.   Follow Up Recommendations  SNF     Equipment Recommendations  None recommended by PT    Recommendations for Other Services       Precautions / Restrictions Precautions Precautions: Fall Precaution Comments: has a Actuary Restrictions Weight Bearing Restrictions: No    Mobility  Bed Mobility Overal bed mobility: Needs Assistance;+2 for physical assistance;+ 2 for safety/equipment Bed Mobility: Rolling (scooting up bed-pt scoots down with independence) Rolling: Max assist;+2 for physical assistance;+2 for safety/equipment (pt resists)   Supine to sit:  (Pt too resistant to attempt)     General bed mobility comments: max of 2 to scoot up bed and pt can scoot down with no help  Transfers                 General transfer comment: unable to attempt due to level of arousal  Ambulation/Gait                 Stairs            Wheelchair Mobility    Modified Rankin (Stroke Patients Only)       Balance                                    Cognition Arousal/Alertness: Lethargic Behavior During Therapy: Restless;Flat affect;Impulsive Overall Cognitive Status: No family/caregiver present to determine baseline cognitive functioning                      Exercises      General Comments General comments (skin integrity, edema, etc.): Pt was seen for positioning and to work on ROM to legs but is too resistant to move them.  Did get  her positioned with help from her sitter.      Pertinent Vitals/Pain Pain Assessment: Faces Pain Score: 0-No pain    Home Living                      Prior Function            PT Goals (current goals can now be found in the care plan section) Acute Rehab PT Goals Patient Stated Goal: not able to state Progress towards PT goals: Not progressing toward goals - comment (pt is too lethargic to work with PT)    Frequency  Min 2X/week    PT Plan Current plan remains appropriate    Co-evaluation             End of Session Equipment Utilized During Treatment: Oxygen Activity Tolerance: Patient limited by lethargy Patient left: in bed;with call bell/phone within reach;with nursing/sitter in room     Time: 0925-0940 PT Time Calculation (min) (ACUTE ONLY): 15 min  Charges:  $Therapeutic Activity: 8-22 mins                    G Codes:  [name] 12/19/2015, 9:50 AM   [name], PT MS Acute Rehab Dept. Number: [phone] [phone] and [name] [phone]

## 2015-12-19 NOTE — Progress Notes (Signed)
Hypoglycemic Event  CBG: 57  Treatment: D50 IV 50 mL  Symptoms: None  Follow-up CBG: [id number] CBG Result:[id number]  Possible Reasons for Event: Inadequate meal intake  Comments/MD notified:NP [place]

## 2015-12-20 LAB — CBC
HEMATOCRIT: 27.5 % — AB (ref 36.0–46.0)
HEMOGLOBIN: 8.5 g/dL — AB (ref 12.0–15.0)
MCH: 32.2 pg (ref 26.0–34.0)
MCHC: 30.9 g/dL (ref 30.0–36.0)
MCV: 104.2 fL — ABNORMAL HIGH (ref 78.0–100.0)
Platelets: 79 10*3/uL — ABNORMAL LOW (ref 150–400)
RBC: 2.64 MIL/uL — ABNORMAL LOW (ref 3.87–5.11)
RDW: 13.3 % (ref 11.5–15.5)
WBC: 5.8 10*3/uL (ref 4.0–10.5)

## 2015-12-20 LAB — GLUCOSE, CAPILLARY
GLUCOSE-CAPILLARY: 135 mg/dL — AB (ref 65–99)
GLUCOSE-CAPILLARY: 137 mg/dL — AB (ref 65–99)
GLUCOSE-CAPILLARY: 135 mg/dL — AB (ref 65–99)
Glucose-Capillary: 117 mg/dL — ABNORMAL HIGH (ref 65–99)
Glucose-Capillary: 145 mg/dL — ABNORMAL HIGH (ref 65–99)
Glucose-Capillary: 188 mg/dL — ABNORMAL HIGH (ref 65–99)
Glucose-Capillary: 69 mg/dL (ref 65–99)

## 2015-12-20 LAB — RENAL FUNCTION PANEL
ALBUMIN: 1.5 g/dL — AB (ref 3.5–5.0)
Anion gap: 8 (ref 5–15)
BUN: 66 mg/dL — AB (ref 6–20)
CALCIUM: 8.5 mg/dL — AB (ref 8.9–10.3)
CO2: 34 mmol/L — AB (ref 22–32)
Chloride: 102 mmol/L (ref 101–111)
Creatinine, Ser: 3.15 mg/dL — ABNORMAL HIGH (ref 0.44–1.00)
GFR calc Af Amer: 18 mL/min — ABNORMAL LOW (ref >60)
GFR calc non Af Amer: 15 mL/min — ABNORMAL LOW (ref >60)
GLUCOSE: 98 mg/dL (ref 65–99)
PHOSPHORUS: 4.8 mg/dL — AB (ref 2.5–4.6)
POTASSIUM: 3.4 mmol/L — AB (ref 3.5–5.1)
SODIUM: 144 mmol/L (ref 135–145)

## 2015-12-20 MED ORDER — POTASSIUM CHLORIDE CRYS ER 20 MEQ PO TBCR
20.0000 meq | EXTENDED_RELEASE_TABLET | Freq: Once | ORAL | Status: AC
  Administered 2015-12-20: 20 meq via ORAL
  Filled 2015-12-20: qty 1

## 2015-12-20 MED ORDER — ENSURE ENLIVE PO LIQD
237.0000 mL | Freq: Three times a day (TID) | ORAL | Status: DC
  Administered 2015-12-20 – 2015-12-25 (×14): 237 mL via ORAL

## 2015-12-20 NOTE — Progress Notes (Signed)
[facility] PROGRESS NOTE  [name] [id number] DOB: 08-29-1961 DOA: 12/08/2015  PCP: Benito Mccreedy, MD  Brief HPI: 55 yo female with bipolar disorder, [psychosis was admitted with weakness and cough. Her renal function declined and she was moved to [facility] for PCCM to place HD cath. She is not competent to sign for procedure and is estranged from her family due to life long mental illness. She was unable to cooperate for HD insertion therefore she was moved to ICU, sedated, intubated and HD cath placed. She was dialyzed by nephrology. Patient was subsequently extubated. Patient remained delirious.  Past medical history:  Past Medical History  Diagnosis Date  . Anemia 07/10/2011  . Thrombocytopenia ([place]) 07/10/2011  . HTN (hypertension)   . COPD (chronic obstructive pulmonary disease) ([place])   . Hyperlipidemia   . Hypothyroidism   . Bipolar affective ([place])   . DM (diabetes mellitus) ([place])   . Renal insufficiency   . 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 Hypertension     Consultants: Nephrology,   STUDIES:  4/10 CXR > Patchy bilateral lower lobe atelectasis or pneumonia. Mild cardiomegaly. 4/11 CXR > Interim partial clearing of bibasilar atelectasis and/or infiltrates. Cardiomegaly. 4/12 CT Head w/o Contrast > Mild diffuse atrophy for age. No intracranial mass, hemorrhage, or focal gray - white compartment lesion. Extensive mastoid air cell disease bilaterally as well as multifocal paranasal sinus disease. 4/14 Renal US > Increased echotexture within the kidneys bilaterally compatible chronic medical renal disease. 6.2 cm minimally complex cyst in the left kidney. No hydronephrosis. 4/15 CXR > Low lung volumes with probable bibasilar subsegmental atelectasis and small left pleural effusion. 4/18 CXR > Slight interval deterioration in the appearance of the chest with worsening of interstitial edema and small pleural  effusions  CULTURES: Blood 4/10 >> NG final Urine 4/10 >> 1,000 colonies/mL insignificant growth Sputum 4/15 >> normal flora 4/10 Influenza panel (-) 4/10 Strep pneumo urinary antigen (-) 4/10 Legionella urinary antigen (-) 4/10 MRSA PCR (-)  ANTIBIOTICS: 4/10 Levaquin>>4/17 4/10 Vancomycin >>4/11 4/10 Primaxin >>4/11 4/15 Vancomycin restarted >>4/17  SIGNIFICANT EVENTS: 4/15 transfer to [facility]  4/17- precedex added 4/18 extubated  LINES/TUBES: 4/15 Right IJ HD catheter Foley catheter OG tube ETT > 4/18 PIV x 2 4/15 ett>>>4/18  Subjective: Patient noted to be less agitated. Following commands more than yesterday. Still unable to communicate appropriately.   Objective:  Vital Signs  Filed Vitals:   12/19/15 2037 12/20/15 0419 12/20/15 0629 12/20/15 0639  BP: 131/79  99/65   Pulse: 70  54 60  Temp: 97.5 F (36.4 C)  97.7 F (36.5 C)   TempSrc: Oral  Oral   Resp: 18  18   Height:      Weight:  88.6 kg (195 lb 5.2 oz)    SpO2: 100%  80% 91%    Intake/Output Summary (Last 24 hours) at 12/20/15 0833 Last data filed at 12/20/15 0647  Gross per 24 hour  Intake   2540 ml  Output   2050 ml  Net    490 ml   Filed Weights   12/18/15 0608 12/19/15 0539 12/20/15 0419  Weight: 91 kg (200 lb 9.9 oz) 93.3 kg (205 lb 11 oz) 88.6 kg (195 lb 5.2 oz)    General appearance: Less Agitated. No distress. Resp: Diminished air entry at the bases without any definite crackles, wheezing or rhonchi. Cardio: regular rate and rhythm, S1, S2 normal, no murmur, click, rub  or gallop GI: soft, non-tender; bowel sounds normal; no masses,  no organomegaly Neurologic: Patient is less agitated. Following commands. Still with some garbled speech. Moving all her extremities.   Lab Results:  Data Reviewed: I have personally reviewed following labs and imaging studies  CBC:  Recent Labs Lab 12/16/15 0410 12/17/15 0455 12/18/15 0504 12/19/15 0415 12/20/15 0518  WBC 5.2 5.8 7.3  6.0 5.8  HGB 8.5* 8.8* 9.0* 8.3* 8.5*  HCT 26.6* 28.3* 29.6* 26.5* 27.5*  MCV 100.8* 102.9* 103.9* 102.7* 104.2*  PLT 38* 44* 57* 62* 79*    Basic Metabolic Panel:  Recent Labs Lab 12/16/15 1648 12/17/15 0455 12/17/15 1657 12/18/15 0504 12/18/15 1636 12/19/15 0415 12/20/15 0518  NA  --  150* 149* 146*  --  146* 144  K  --  3.8 3.9 3.9  --  3.5 3.4*  CL  --  109 105 103  --  104 102  CO2  --  32 34* 33*  --  32 34*  GLUCOSE  --  113* 96 92  --  66 98  BUN  --  75* 80* 87*  --  81* 66*  CREATININE  --  2.99* 3.09* 3.35*  --  3.38* 3.15*  CALCIUM  --  8.7* 8.7* 8.7*  --  8.8* 8.5*  MG 2.2 1.8 1.7 1.7 1.8  --   --   PHOS 4.6 4.5  4.5 4.9* 5.0*  4.8* 5.2* 4.8* 4.8*    GFR: Estimated Creatinine Clearance: 22.5 mL/min (by C-G formula based on Cr of 3.15).  Liver Function Tests:  Recent Labs Lab 12/15/15 0500 12/17/15 0455 12/18/15 0504 12/19/15 0415 12/20/15 0518  AST 14*  --   --   --   --   ALT 12*  --   --   --   --   ALKPHOS 61  --   --   --   --   BILITOT 0.7  --   --   --   --   PROT 5.0*  --   --   --   --   ALBUMIN 1.6* 1.6* 1.6* 1.6* 1.5*   CBG:  Recent Labs Lab 12/19/15 1714 12/19/15 2034 12/20/15 0015 12/20/15 0415 12/20/15 0753  GLUCAP 100* 104* 145* 135* 69    Urine analysis:    Component Value Date/Time   COLORURINE YELLOW 12/12/2015 1245   APPEARANCEUR CLOUDY* 12/12/2015 1245   LABSPEC 1.013 12/12/2015 1245   PHURINE 5.5 12/12/2015 1245   GLUCOSEU NEGATIVE 12/12/2015 1245   HGBUR MODERATE* 12/12/2015 1245   BILIRUBINUR NEGATIVE 12/12/2015 1245   KETONESUR NEGATIVE 12/12/2015 1245   PROTEINUR 100* 12/12/2015 1245   UROBILINOGEN 0.2 06/28/2015 0343   NITRITE NEGATIVE 12/12/2015 1245   LEUKOCYTESUR NEGATIVE 12/12/2015 1245    Recent Results (from the past 240 hour(s))  Culture, expectorated sputum-assessment     Status: None   Collection Time: 12/13/15  4:55 PM  Result Value Ref Range Status   Specimen Description SPUTUM  Final    Special Requests NONE  Final   Sputum evaluation   Final    THIS SPECIMEN IS ACCEPTABLE. RESPIRATORY CULTURE REPORT TO FOLLOW.   Report Status 12/13/2015 FINAL  Final  Culture, respiratory (NON-Expectorated)     Status: None   Collection Time: 12/13/15  4:55 PM  Result Value Ref Range Status   Specimen Description SPUTUM  Final   Special Requests NONE  Final   Gram Stain   Final    ABUNDANT  WBC PRESENT, PREDOMINANTLY PMN FEW SQUAMOUS EPITHELIAL CELLS PRESENT ABUNDANT GRAM POSITIVE COCCI IN PAIRS IN CLUSTERS Performed at [facility]    Culture   Final    NORMAL OROPHARYNGEAL FLORA Performed at [facility]    Report Status 12/17/2015 FINAL  Final      Radiology Studies: No results found.   Medications:  Scheduled: . amantadine  100 mg Oral BID  . antiseptic oral rinse  7 mL Mouth Rinse q12n4p  . ARIPiprazole  15 mg Oral BH-qamhs  . benztropine  0.5 mg Oral BID  . free water  300 mL Per Tube Q6H  . guaiFENesin  15 mL Oral Q6H  . haloperidol lactate  2.5 mg Intravenous Q6H  . insulin aspart  0-9 Units Subcutaneous Q4H  . levothyroxine  75 mcg Oral QAC breakfast  . pantoprazole sodium  40 mg Per Tube Q1200  . potassium chloride  20 mEq Oral Once  . sodium chloride  500 mL Intravenous Once  . sodium chloride flush  3 mL Intravenous Q12H  . sodium chloride flush  3 mL Intravenous Q12H  . valproic acid  750 mg Oral TID   Continuous: . dextrose 100 mL/hr at 12/20/15 0040   [id number] (TYLENOL) oral liquid 160 mg/5 mL, acetaminophen **OR** acetaminophen, albuterol, budesonide (PULMICORT) nebulizer solution, ondansetron **OR** ondansetron (ZOFRAN) IV, polyethylene glycol, RESOURCE THICKENUP CLEAR, sodium chloride flush  Assessment/Plan:  Principal Problem:   Bipolar disorder, current episode manic severe with psychotic features ([place]) Active Problems:   HTN (hypertension)   COPD (chronic obstructive pulmonary disease) ([facility])   Chronic kidney  disease   Prediabetes   COPD with acute exacerbation ([place])   HCAP (healthcare-associated pneumonia)   Renal failure   Acute encephalopathy   Sepsis ([place])   Acute respiratory failure ([place])    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 Patient's renal function continued to get worse. Nephrology was consulted. Renal ultrasound did not show any hydronephrosis. Nephrology recommended transferring the patient to [facility] to undergo dialysis. Patient was admitted to the intensive care unit. She was intubated so that she could undergo placement of dialysis catheter. She was dialyzed. Renal function is slowly improving. Patient has very good urine output. Nephrology has signed off. Potassium is low this morning. She'll be given 20 mEq KCL.  Acute encephalopathy/delirium Patient mental status appears to be improving. She is not as agitated as she was yesterday. She is following commands. She is tolerating oral intake. Patient has a significant history of mental health disorders. She has bipolar disorder and schizoaffective disorder. She has been admitted to behavioral health in the past. No family is available. CT head did not show any acute findings. It appears that her delirium is mainly due to medical reasons. Her underlying psychiatric illness is likely contributing as well. Continue her psychotropic medications. Cortisol level is normal. TSH was 4.54. Free T4 0.85.  COPD exacerbation, resolved Suspect precipitated by infectious process as chest x-ray showed patchy bilateral lower lobe pneumonia. Now improved. Off of steroids.  Community acquire pneumonia, completed course of antibiotics.  Patient presented with complaints of shortness of breath and cough with chest x-ray showing findings consistent with pneumonia. No clear-cut evidence for sepsis.  Patient was given antibiotics. She has completed course.  Bipolar disorder/schizoaffective disorder Psychiatry is following. She is on multiple psychotropic medications. She is on Haldol intravenously.  Transient Hypotension Noted while she was in the intensive care unit. She required pressors. Stable now. Etiology is unclear.  Thrombocytopenia Etiology is unclear. Counts continue to improve. No overt bleeding. Continue to monitor. No heparin products.  Macrocytic anemia Hemoglobin is stable. TSH was slightly elevated. Free T4 is normal. B-12 level is high and folic acid level is normal.   History of hypothyroidism Continue levothyroxine.  Nutrition Patient pulled out her NG tube 4/20. Oral diet.  DVT Prophylaxis: SCDs    Code Status: Full code  Family Communication: No family is available  Disposition Plan: Patient's mental status appeared to be improving. Her renal function is improving as well. Continue current course of management. She will likely need placement to skilled nursing facility for rehabilitation when medically ready for discharge.    LOS: 12 days   [facility] Pager [phone] 12/20/2015, 8:33 AM  If 7PM-7AM, please contact night-coverage at [email or web address], password [facility]

## 2015-12-21 DIAGNOSIS — N19 Unspecified kidney failure: Secondary | ICD-10-CM

## 2015-12-21 LAB — CBC
HEMATOCRIT: 27.6 % — AB (ref 36.0–46.0)
Hemoglobin: 8.6 g/dL — ABNORMAL LOW (ref 12.0–15.0)
MCH: 31.9 pg (ref 26.0–34.0)
MCHC: 31.2 g/dL (ref 30.0–36.0)
MCV: 102.2 fL — ABNORMAL HIGH (ref 78.0–100.0)
PLATELETS: 102 10*3/uL — AB (ref 150–400)
RBC: 2.7 MIL/uL — ABNORMAL LOW (ref 3.87–5.11)
RDW: 13.1 % (ref 11.5–15.5)
WBC: 7.7 10*3/uL (ref 4.0–10.5)

## 2015-12-21 LAB — RENAL FUNCTION PANEL
ALBUMIN: 1.6 g/dL — AB (ref 3.5–5.0)
Anion gap: 10 (ref 5–15)
BUN: 64 mg/dL — AB (ref 6–20)
CALCIUM: 9.1 mg/dL (ref 8.9–10.3)
CO2: 30 mmol/L (ref 22–32)
CREATININE: 3.41 mg/dL — AB (ref 0.44–1.00)
Chloride: 102 mmol/L (ref 101–111)
GFR calc Af Amer: 16 mL/min — ABNORMAL LOW (ref >60)
GFR, EST NON AFRICAN AMERICAN: 14 mL/min — AB (ref >60)
GLUCOSE: 92 mg/dL (ref 65–99)
PHOSPHORUS: 4.2 mg/dL (ref 2.5–4.6)
POTASSIUM: 3.8 mmol/L (ref 3.5–5.1)
SODIUM: 142 mmol/L (ref 135–145)

## 2015-12-21 LAB — GLUCOSE, CAPILLARY
GLUCOSE-CAPILLARY: 164 mg/dL — AB (ref 65–99)
GLUCOSE-CAPILLARY: 198 mg/dL — AB (ref 65–99)
Glucose-Capillary: 106 mg/dL — ABNORMAL HIGH (ref 65–99)
Glucose-Capillary: 116 mg/dL — ABNORMAL HIGH (ref 65–99)
Glucose-Capillary: 139 mg/dL — ABNORMAL HIGH (ref 65–99)

## 2015-12-21 MED ORDER — HALOPERIDOL LACTATE 5 MG/ML IJ SOLN
2.5000 mg | Freq: Four times a day (QID) | INTRAMUSCULAR | Status: DC | PRN
  Administered 2015-12-23: 2.5 mg via INTRAVENOUS
  Filled 2015-12-21 (×2): qty 1

## 2015-12-21 NOTE — Progress Notes (Signed)
[facility] PROGRESS NOTE  [name] [id number] DOB: [birth year] DOA: 12/08/2015  PCP: Benito Mccreedy, MD  Brief HPI: 55 yo female with bipolar disorder, [psychosis was admitted with weakness and cough. Her renal function declined and she was moved to [facility] for PCCM to place HD cath. She is not competent to sign for procedure and is estranged from her family due to life long mental illness. She was unable to cooperate for HD insertion therefore she was moved to ICU, sedated, intubated and HD cath placed. She was dialyzed by nephrology. Patient was subsequently extubated. Patient remained delirious.  Past medical history:  Past Medical History  Diagnosis Date  . Anemia 07/10/2011  . Thrombocytopenia ([place]) 07/10/2011  . HTN (hypertension)   . COPD (chronic obstructive pulmonary disease) ([place])   . Hyperlipidemia   . Hypothyroidism   . Bipolar affective ([place])   . DM (diabetes mellitus) ([place])   . Renal insufficiency   . 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 Hypertension     Consultants: Nephrology,   STUDIES:  4/10 CXR > Patchy bilateral lower lobe atelectasis or pneumonia. Mild cardiomegaly. 4/11 CXR > Interim partial clearing of bibasilar atelectasis and/or infiltrates. Cardiomegaly. 4/12 CT Head w/o Contrast > Mild diffuse atrophy for age. No intracranial mass, hemorrhage, or focal gray - white compartment lesion. Extensive mastoid air cell disease bilaterally as well as multifocal paranasal sinus disease. 4/14 Renal US > Increased echotexture within the kidneys bilaterally compatible chronic medical renal disease. 6.2 cm minimally complex cyst in the left kidney. No hydronephrosis. 4/15 CXR > Low lung volumes with probable bibasilar subsegmental atelectasis and small left pleural effusion. 4/18 CXR > Slight interval deterioration in the appearance of the chest with worsening of interstitial edema and small pleural  effusions  CULTURES: Blood 4/10 >> NG final Urine 4/10 >> 1,000 colonies/mL insignificant growth Sputum 4/15 >> normal flora 4/10 Influenza panel (-) 4/10 Strep pneumo urinary antigen (-) 4/10 Legionella urinary antigen (-) 4/10 MRSA PCR (-)  ANTIBIOTICS: 4/10 Levaquin>>4/17 4/10 Vancomycin >>4/11 4/10 Primaxin >>4/11 4/15 Vancomycin restarted >>4/17  SIGNIFICANT EVENTS: 4/15 transfer to [facility]  4/17- precedex added 4/18 extubated  LINES/TUBES: 4/15 Right IJ HD catheter Foley catheter OG tube ETT > 4/18 PIV x 2 4/15 ett>>>4/18  Subjective: Patient is less agitated. Still quite distracted and confused. Still unable to communicate appropriately.   Objective:  Vital Signs  Filed Vitals:   12/20/15 0639 12/20/15 1128 12/20/15 2200 12/21/15 0516  BP:  107/69 101/71 135/79  Pulse: 60 84 91 88  Temp:  98.4 F (36.9 C) 98 F (36.7 C) 98.5 F (36.9 C)  TempSrc:   Oral Oral  Resp:  18  18  Height:      Weight:      SpO2: 91% 100% 97% 94%    Intake/Output Summary (Last 24 hours) at 12/21/15 0945 Last data filed at 12/21/15 0658  Gross per 24 hour  Intake   2500 ml  Output   1975 ml  Net    525 ml   Filed Weights   12/18/15 0608 12/19/15 0539 12/20/15 0419  Weight: 91 kg (200 lb 9.9 oz) 93.3 kg (205 lb 11 oz) 88.6 kg (195 lb 5.2 oz)    General appearance: Less Agitated. But remains delirious. No distress. Resp: Diminished air entry at the bases without any definite crackles, wheezing or rhonchi. Cardio: regular rate and rhythm, S1, S2 normal, no murmur, click, rub or  gallop GI: soft, non-tender; bowel sounds normal; no masses,  no organomegaly Neurologic: Patient is less agitated. Following commands. Still with some garbled speech. Moving all her extremities.   Lab Results:  Data Reviewed: I have personally reviewed following labs and imaging studies  CBC:  Recent Labs Lab 12/17/15 0455 12/18/15 0504 12/19/15 0415 12/20/15 0518 12/21/15 0323  WBC  5.8 7.3 6.0 5.8 7.7  HGB 8.8* 9.0* 8.3* 8.5* 8.6*  HCT 28.3* 29.6* 26.5* 27.5* 27.6*  MCV 102.9* 103.9* 102.7* 104.2* 102.2*  PLT 44* 57* 62* 79* 102*    Basic Metabolic Panel:  Recent Labs Lab 12/16/15 1648 12/17/15 0455 12/17/15 1657 12/18/15 0504 12/18/15 1636 12/19/15 0415 12/20/15 0518 12/21/15 0323  NA  --  150* 149* 146*  --  146* 144 142  K  --  3.8 3.9 3.9  --  3.5 3.4* 3.8  CL  --  109 105 103  --  104 102 102  CO2  --  32 34* 33*  --  32 34* 30  GLUCOSE  --  113* 96 92  --  66 98 92  BUN  --  75* 80* 87*  --  81* 66* 64*  CREATININE  --  2.99* 3.09* 3.35*  --  3.38* 3.15* 3.41*  CALCIUM  --  8.7* 8.7* 8.7*  --  8.8* 8.5* 9.1  MG 2.2 1.8 1.7 1.7 1.8  --   --   --   PHOS 4.6 4.5  4.5 4.9* 5.0*  4.8* 5.2* 4.8* 4.8* 4.2    GFR: Estimated Creatinine Clearance: 20.8 mL/min (by C-G formula based on Cr of 3.41).  Liver Function Tests:  Recent Labs Lab 12/15/15 0500 12/17/15 0455 12/18/15 0504 12/19/15 0415 12/20/15 0518 12/21/15 0323  AST 14*  --   --   --   --   --   ALT 12*  --   --   --   --   --   ALKPHOS 61  --   --   --   --   --   BILITOT 0.7  --   --   --   --   --   PROT 5.0*  --   --   --   --   --   ALBUMIN 1.6* 1.6* 1.6* 1.6* 1.5* 1.6*   CBG:  Recent Labs Lab 12/20/15 1648 12/20/15 2019 12/20/15 2359 12/21/15 0514 12/21/15 0810  GLUCAP 137* 135* 117* 106* 116*    Urine analysis:    Component Value Date/Time   COLORURINE YELLOW 12/12/2015 1245   APPEARANCEUR CLOUDY* 12/12/2015 1245   LABSPEC 1.013 12/12/2015 1245   PHURINE 5.5 12/12/2015 1245   GLUCOSEU NEGATIVE 12/12/2015 1245   HGBUR MODERATE* 12/12/2015 1245   BILIRUBINUR NEGATIVE 12/12/2015 1245   KETONESUR NEGATIVE 12/12/2015 1245   PROTEINUR 100* 12/12/2015 1245   UROBILINOGEN 0.2 06/28/2015 0343   NITRITE NEGATIVE 12/12/2015 1245   LEUKOCYTESUR NEGATIVE 12/12/2015 1245    Recent Results (from the past 240 hour(s))  Culture, expectorated sputum-assessment      Status: None   Collection Time: 12/13/15  4:55 PM  Result Value Ref Range Status   Specimen Description SPUTUM  Final   Special Requests NONE  Final   Sputum evaluation   Final    THIS SPECIMEN IS ACCEPTABLE. RESPIRATORY CULTURE REPORT TO FOLLOW.   Report Status 12/13/2015 FINAL  Final  Culture, respiratory (NON-Expectorated)     Status: None   Collection Time: 12/13/15  4:55 PM  Result Value Ref Range Status   Specimen Description SPUTUM  Final   Special Requests NONE  Final   Gram Stain   Final    ABUNDANT WBC PRESENT, PREDOMINANTLY PMN FEW SQUAMOUS EPITHELIAL CELLS PRESENT ABUNDANT GRAM POSITIVE COCCI IN PAIRS IN CLUSTERS Performed at [facility]    Culture   Final    NORMAL OROPHARYNGEAL FLORA Performed at [facility]    Report Status 12/17/2015 FINAL  Final      Radiology Studies: No results found.   Medications:  Scheduled: . amantadine  100 mg Oral BID  . antiseptic oral rinse  7 mL Mouth Rinse q12n4p  . ARIPiprazole  15 mg Oral BH-qamhs  . benztropine  0.5 mg Oral BID  . feeding supplement (ENSURE ENLIVE)  237 mL Oral TID BM  . guaiFENesin  15 mL Oral Q6H  . haloperidol lactate  2.5 mg Intravenous Q6H  . insulin aspart  0-9 Units Subcutaneous Q4H  . levothyroxine  75 mcg Oral QAC breakfast  . pantoprazole sodium  40 mg Per Tube Q1200  . sodium chloride  500 mL Intravenous Once  . sodium chloride flush  3 mL Intravenous Q12H  . sodium chloride flush  3 mL Intravenous Q12H  . valproic acid  750 mg Oral TID   Continuous: . dextrose 100 mL/hr at 12/21/15 [id number]   [id number] (TYLENOL) oral liquid 160 mg/5 mL, acetaminophen **OR** acetaminophen, albuterol, budesonide (PULMICORT) nebulizer solution, ondansetron **OR** ondansetron (ZOFRAN) IV, polyethylene glycol, RESOURCE THICKENUP CLEAR, sodium chloride flush  Assessment/Plan:  Principal Problem:   Bipolar disorder, current episode manic severe with psychotic features ([place]) Active  Problems:   HTN (hypertension)   COPD (chronic obstructive pulmonary disease) (H[facility])   Chronic kidney disease   Prediabetes   COPD with acute exacerbation ([place])   HCAP (healthcare-associated pneumonia)   Renal failure   Acute encephalopathy   Sepsis ([place])   Acute respiratory failure ([place])    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 Patient's renal function continued to get worse. Nephrology was consulted. Renal ultrasound did not show any hydronephrosis. Nephrology recommended transferring the patient to [facility] to undergo dialysis. Patient was admitted to the intensive care unit. She was intubated so that she could undergo placement of dialysis catheter. She was dialyzed. Renal function is slowly improving, although her creatinine is higher today than yesterday. Patient has very good urine output. Nephrology has signed off. Potassium is normal today. Continue to monitor for now. Repeat tomorrow.  Acute encephalopathy/delirium Patient's mental status remains as yesterday. Delirium persists. Compared to a few days ago. She is not as agitated, however. Patient has a significant history of mental health disorders. She has bipolar disorder and schizoaffective disorder. She has been admitted to behavioral health in the past. No family is available. CT head did not show any acute findings. It appears that her delirium is mainly due to medical reasons including renal failure. Her underlying psychiatric illness is likely contributing as well. Continue her psychotropic medications. Cortisol level is normal. TSH was 4.54. Free T4 0.85.  COPD exacerbation, resolved Suspect precipitated by infectious process as chest x-ray showed patchy bilateral lower lobe pneumonia. Now improved. Off of steroids.  Community acquire pneumonia, completed course of  antibiotics.  Patient presented with complaints of shortness of breath and cough with chest x-ray showing findings consistent with pneumonia. No clear-cut  evidence for sepsis. Patient was given antibiotics. She has completed course.  Bipolar disorder/schizoaffective disorder Psychiatry is following. She is on multiple psychotropic medications. She is on Haldol intravenously.  Transient Hypotension Noted while she was in the intensive care unit. She required pressors. Stable now. Etiology is unclear.  Thrombocytopenia Etiology is unclear. Counts continue to improve. No overt bleeding. Continue to monitor. No heparin products.  Macrocytic anemia Hemoglobin is stable. TSH was slightly elevated. Free T4 is normal. B-12 level is high and folic acid level is normal.   History of hypothyroidism Continue levothyroxine.  Nutrition Patient pulled out her NG tube 4/20. Now on Oral diet.  DVT Prophylaxis: SCDs    Code Status: Full code  Family Communication: No family is available  Disposition Plan: Patient's mental status slowly improving. She, however, still remains confused and somewhat agitated. Still pulling on her IV lines. Renal function is stable. She has good urine output. She will likely need placement to skilled nursing facility for rehabilitation when medically ready for discharge. Mental status needs to improve some more.    LOS: 13 days   [facility] Pager [phone] 12/21/2015, 9:45 AM  If 7PM-7AM, please contact night-coverage at [email or web address], password [email or web address]

## 2015-12-22 LAB — GLUCOSE, CAPILLARY
GLUCOSE-CAPILLARY: 179 mg/dL — AB (ref 65–99)
GLUCOSE-CAPILLARY: 156 mg/dL — AB (ref 65–99)
Glucose-Capillary: 154 mg/dL — ABNORMAL HIGH (ref 65–99)
Glucose-Capillary: 83 mg/dL (ref 65–99)
Glucose-Capillary: 85 mg/dL (ref 65–99)
Glucose-Capillary: 85 mg/dL (ref 65–99)

## 2015-12-22 LAB — RENAL FUNCTION PANEL
ALBUMIN: 1.8 g/dL — AB (ref 3.5–5.0)
ANION GAP: 13 (ref 5–15)
BUN: 57 mg/dL — ABNORMAL HIGH (ref 6–20)
CALCIUM: 8.9 mg/dL (ref 8.9–10.3)
CO2: 30 mmol/L (ref 22–32)
CREATININE: 3.4 mg/dL — AB (ref 0.44–1.00)
Chloride: 99 mmol/L — ABNORMAL LOW (ref 101–111)
GFR calc non Af Amer: 14 mL/min — ABNORMAL LOW (ref >60)
GFR, EST AFRICAN AMERICAN: 16 mL/min — AB (ref >60)
GLUCOSE: 74 mg/dL (ref 65–99)
PHOSPHORUS: 4.4 mg/dL (ref 2.5–4.6)
Potassium: 3.5 mmol/L (ref 3.5–5.1)
SODIUM: 142 mmol/L (ref 135–145)

## 2015-12-22 MED ORDER — NICOTINE 14 MG/24HR TD PT24
14.0000 mg | MEDICATED_PATCH | Freq: Every day | TRANSDERMAL | Status: DC
  Administered 2015-12-22 – 2015-12-25 (×5): 14 mg via TRANSDERMAL
  Filled 2015-12-22 (×4): qty 1

## 2015-12-22 NOTE — Care Management Important Message (Signed)
Important Message  Patient Details  Name: [name] MRN: [id number] Date of Birth: [birth year]   Medicare Important Message Given:  Yes    Nathen May 12/22/2015, 12:26 PM

## 2015-12-22 NOTE — Consult Note (Signed)
[facility] Face-to-Face Psychiatry Consult follow-up  Reason for Consult:  Delirium  Referring Physician:  Dr. [place] Pink Patient Identification: [name] MRN:  [id number] Principal Diagnosis: Bipolar disorder, current episode manic severe with psychotic features [facility]) Diagnosis:   Patient Active Problem List   Diagnosis Date Noted  . Acute respiratory failure ([place]) [J96.00]   . Acute encephalopathy [G93.40]   . Sepsis ([place]) [A41.9]   . Renal failure [N19] 12/13/2015  . COPD with acute exacerbation ([place]) [J44.1] 12/08/2015  . HCAP (healthcare-associated pneumonia) [J18.9] 12/08/2015  . Acute renal failure ([place]) [N17.9]   . Skin lesions [L98.9] 07/04/2015  . Prediabetes [R73.03] 07/03/2015  . Bipolar disorder, current episode manic severe with psychotic features ([place]) [F31.2] 06/30/2015  . History of breast cancer in female [Z85.3] 04/10/2015  . Irreducible ventral hernia [K46.0] 04/12/2013  . HTN (hypertension) [I10]   . COPD (chronic obstructive pulmonary disease) ([place]) [J44.9]   . Chronic kidney disease [N18.9]   . Hyperlipidemia [E78.5]   . Hypothyroidism [E03.9]   . Bipolar affective ([place]) [F31.9]   . Anemia in chronic kidney disease(285.21) [N03.9, D63.1] 07/10/2011  . Thrombocytopenia ([place]) [D69.6] 07/10/2011  . BIPOLAR AFFECTIVE DISORDER [F31.9] 08/15/2006  . SMOKER [F17.200] 08/15/2006  . COPD [J44.9] 08/15/2006  . BOILS, RECURRENT [L02.92, L02.93] 08/15/2006  . ACNE ROSACEA [L71.9] 08/15/2006  . ACNE NEC [L70.8] 08/15/2006  . EDEMA [R60.9] 08/15/2006  . INCONTINENCE, URGE [N39.41] 08/15/2006    Total Time spent with patient: 20 minutes  Subjective:   [name] is a 55 y.o. female patient admitted with manic psychosis and currently in delirium.  HPI:  [name] is a 55 year old female, seen, chart reviewed and case discussed with the staff RN. Patient was seen 1 week ago at [facility] for schizoaffective disorder with increased  agitation and aggressive behaviors also appear to be in delirious state. Patient was admitted to [facility] for critical care which required intubation and also required dialysis. Patient is currently appeared lying in her bed with the decreased mentation and briefly spoken less than one minute with the shock times is like yes or no before she fallen to sleep. Patient appeared to be talking herself with the slurring speech or mumbling, difficult to comprehend. Safety sitter stated that this is the maximum interaction she had with anybody since this morning. We will try to obtain information with her out patient psych , Case manager and ACT team services and also possible guardianship information, required latest psychiatric medication changes. She is a resident of boarding home, [facility] She has limited orientation even at admission to the [facility] long hospital and able to tell her address on admission. Patient brother has no contact with her several years and not interested to be in her care as per staff.   Past Psychiatric History: She has chronic history of schizoaffective disorder, agitation and paranoid with violent behaviors. Schizoaffective disoder and recent admission to [facility] November 2016.   Interval history: Patient seen for psychiatric consultation follow-up today. Patient has been awake but not alert or oriented to time place person and situation. Patient has been responding verbally but her speech is noncompressible. Patient trying to get out of the bed but she has been restrained and also has glucose on her both hands to prevent pulling the tubes. Patient is not able to answer glucose and slight depression, anxiety, psychosis, suicidal/homicidal ideation, intention or plans. Patient has been noncompliant with her education management for bipolar disorder and schizophrenia. Patient  has no current aggressive behaviors.  Risk to Self: Is patient at risk for suicide?: No Risk to Others:    Prior Inpatient Therapy:   Prior Outpatient Therapy:    Past Medical History:  Past Medical History  Diagnosis Date  . Anemia 07/10/2011  . Thrombocytopenia ([place]) 07/10/2011  . HTN (hypertension)   . COPD (chronic obstructive pulmonary disease) ([place])   . Hyperlipidemia   . Hypothyroidism   . Bipolar affective ([place])   . DM (diabetes mellitus) ([place])   . Renal insufficiency   . 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 Hypertension     Past Surgical History  Procedure Laterality Date  . Ankle surgery Left 1989  . Tonsillectomy  AGE 29  . Breast surgery Right 2010    BREAST  . Ventral hernia repair N/A 06/06/2013    Procedure:  OPEN VENTRAL HERNIA REPAIR;  Surgeon: Imogene Burn. Georgette Dover, MD;  Location: [facility] ORS;  Service: General;  Laterality: N/A;  . Insertion of mesh N/A 06/06/2013    Procedure: INSERTION OF MESH;  Surgeon: Imogene Burn. Georgette Dover, MD;  Location: [facility] ORS;  Service: General;  Laterality: N/A;   Family History:  Family History  Problem Relation Age of Onset  . Diabetes Mother   . Cancer Father   . HIV Brother    Family Psychiatric  History: unknown Social History:  History  Alcohol Use No     History  Drug Use No    Social History   Social History  . Marital Status: Divorced    Spouse Name: N/A  . Number of Children: N/A  . Years of Education: N/A   Social History Main Topics  . Smoking status: Current Every Day Smoker -- 0.50 packs/day for 40 years    Types: Cigarettes, Cigars  . Smokeless tobacco: Never Used  . Alcohol Use: No  . Drug Use: No  . Sexual Activity: Not Asked   Other Topics Concern  . None   Social History Narrative   Additional Social History:    Allergies:   Allergies  Allergen Reactions  . Doxycycline Other (See Comments)    REACTION: Unknown reaction  . Glipizide Other (See Comments)    unknown  . Ibuprofen Other (See Comments)    CANNOT TAKE IBUPROFEN OR MOTRIN DUE TO CURRENT MEDS  . Penicillins Other  (See Comments)    Has patient had a PCN reaction causing immediate rash, facial/tongue/throat swelling, SOB or lightheadedness with hypotension: unknown Has patient had a PCN reaction causing severe rash involving mucus membranes or skin necrosis: unknown Has patient had a PCN reaction that required hospitalization Unknown Has patient had a PCN reaction occurring within the last 10 years: Unknown If all of the above answers are "NO", then may proceed with Cephalosporin use.   . Sulfonamide Derivatives     REACTION: Unknown reaction  . Tetracycline Other (See Comments)    REACTION: Unknown reaction  . Tolterodine Tartrate Itching  . Chlorostat [Chlorhexidine] Rash  . Cogentin [Benztropine] Rash  . Oxybutynin Chloride Itching and Rash    Labs:  Results for orders placed or performed during the hospital encounter of 12/08/15 (from the past 48 hour(s))  Glucose, capillary     Status: Abnormal   Collection Time: 12/20/15 11:30 AM  Result Value Ref Range   Glucose-Capillary 188 (H) 65 - 99 mg/dL  Glucose, capillary     Status: Abnormal   Collection Time: 12/20/15  4:48 PM  Result Value Ref Range   Glucose-Capillary 137 (H) 65 - 99 mg/dL  Glucose, capillary     Status: Abnormal   Collection Time: 12/20/15  8:19 PM  Result Value Ref Range   Glucose-Capillary 135 (H) 65 - 99 mg/dL  Glucose, capillary     Status: Abnormal   Collection Time: 12/20/15 11:59 PM  Result Value Ref Range   Glucose-Capillary 117 (H) 65 - 99 mg/dL   Comment 1 Notify RN    Comment 2 Document in Chart   Renal function panel     Status: Abnormal   Collection Time: 12/21/15  3:23 AM  Result Value Ref Range   Sodium 142 135 - 145 mmol/L   Potassium 3.8 3.5 - 5.1 mmol/L   Chloride 102 101 - 111 mmol/L   CO2 30 22 - 32 mmol/L   Glucose, Bld 92 65 - 99 mg/dL   BUN 64 (H) 6 - 20 mg/dL   Creatinine, Ser 3.41 (H) 0.44 - 1.00 mg/dL   Calcium 9.1 8.9 - 10.3 mg/dL   Phosphorus 4.2 2.5 - 4.6 mg/dL   Albumin 1.6 (L)  3.5 - 5.0 g/dL   GFR calc non Af Amer 14 (L) >60 mL/min   GFR calc Af Amer 16 (L) >60 mL/min    Comment: (NOTE) The eGFR has been calculated using the CKD EPI equation. This calculation has not been validated in all clinical situations. eGFR's persistently <60 mL/min signify possible Chronic Kidney Disease.    Anion gap 10 5 - 15  CBC     Status: Abnormal   Collection Time: 12/21/15  3:23 AM  Result Value Ref Range   WBC 7.7 4.0 - 10.5 K/uL   RBC 2.70 (L) 3.87 - 5.11 MIL/uL   Hemoglobin 8.6 (L) 12.0 - 15.0 g/dL   HCT 27.6 (L) 36.0 - 46.0 %   MCV 102.2 (H) 78.0 - 100.0 fL   MCH 31.9 26.0 - 34.0 pg   MCHC 31.2 30.0 - 36.0 g/dL   RDW 13.1 11.5 - 15.5 %   Platelets 102 (L) 150 - 400 K/uL    Comment: CONSISTENT WITH PREVIOUS RESULT  Glucose, capillary     Status: Abnormal   Collection Time: 12/21/15  5:14 AM  Result Value Ref Range   Glucose-Capillary 106 (H) 65 - 99 mg/dL   Comment 1 Notify RN    Comment 2 Document in Chart   Glucose, capillary     Status: Abnormal   Collection Time: 12/21/15  8:10 AM  Result Value Ref Range   Glucose-Capillary 116 (H) 65 - 99 mg/dL   Comment 1 Notify RN   Glucose, capillary     Status: Abnormal   Collection Time: 12/21/15 12:21 PM  Result Value Ref Range   Glucose-Capillary 164 (H) 65 - 99 mg/dL   Comment 1 Notify RN   Glucose, capillary     Status: Abnormal   Collection Time: 12/21/15  5:03 PM  Result Value Ref Range   Glucose-Capillary 139 (H) 65 - 99 mg/dL   Comment 1 Notify RN   Glucose, capillary     Status: Abnormal   Collection Time: 12/21/15  8:25 PM  Result Value Ref Range   Glucose-Capillary 198 (H) 65 - 99 mg/dL  Glucose, capillary     Status: Abnormal   Collection Time: 12/22/15 12:15 AM  Result Value Ref Range   Glucose-Capillary 154 (H) 65 - 99 mg/dL  Glucose, capillary     Status: None   Collection  Time: 12/22/15  4:07 AM  Result Value Ref Range   Glucose-Capillary 85 65 - 99 mg/dL  Renal function panel     Status:  Abnormal   Collection Time: 12/22/15  6:19 AM  Result Value Ref Range   Sodium 142 135 - 145 mmol/L   Potassium 3.5 3.5 - 5.1 mmol/L   Chloride 99 (L) 101 - 111 mmol/L   CO2 30 22 - 32 mmol/L   Glucose, Bld 74 65 - 99 mg/dL   BUN 57 (H) 6 - 20 mg/dL   Creatinine, Ser 3.40 (H) 0.44 - 1.00 mg/dL   Calcium 8.9 8.9 - 10.3 mg/dL   Phosphorus 4.4 2.5 - 4.6 mg/dL   Albumin 1.8 (L) 3.5 - 5.0 g/dL   GFR calc non Af Amer 14 (L) >60 mL/min   GFR calc Af Amer 16 (L) >60 mL/min    Comment: (NOTE) The eGFR has been calculated using the CKD EPI equation. This calculation has not been validated in all clinical situations. eGFR's persistently <60 mL/min signify possible Chronic Kidney Disease.    Anion gap 13 5 - 15  Glucose, capillary     Status: None   Collection Time: 12/22/15  7:49 AM  Result Value Ref Range   Glucose-Capillary 83 65 - 99 mg/dL   Comment 1 Notify RN     Current Facility-Administered Medications  Medication Dose Route Frequency Provider Last Rate Last Dose  . acetaminophen (TYLENOL) solution 650 mg  650 mg Oral Q6H PRN Kelvin Cellar, MD      . acetaminophen (TYLENOL) tablet 650 mg  650 mg Oral Q6H PRN Reyne Dumas, MD       Or  . acetaminophen (TYLENOL) suppository 650 mg  650 mg Rectal Q6H PRN Reyne Dumas, MD      . albuterol (PROVENTIL) (2.5 MG/3ML) 0.083% nebulizer solution 2.5 mg  2.5 mg Nebulization Q6H PRN Jule Ser, DO      . amantadine (SYMMETREL) solution 100 mg  100 mg Oral BID Kelvin Cellar, MD   100 mg at 12/22/15 0917  . antiseptic oral rinse (CPC / CETYLPYRIDINIUM CHLORIDE 0.05%) solution 7 mL  7 mL Mouth Rinse q12n4p Collene Gobble, MD   7 mL at 12/20/15 1737  . ARIPiprazole (ABILIFY) tablet 15 mg  15 mg Oral BH-qamhs Reyne Dumas, MD   15 mg at 12/22/15 0918  . benztropine (COGENTIN) tablet 0.5 mg  0.5 mg Oral BID Reyne Dumas, MD   0.5 mg at 12/22/15 0916  . budesonide (PULMICORT) nebulizer solution 0.25 mg  0.25 mg Nebulization BID PRN Jule Ser, DO      . dextrose 5 % solution   Intravenous Continuous Bonnielee Haff, MD 50 mL/hr at 12/22/15 0933    . feeding supplement (ENSURE ENLIVE) (ENSURE ENLIVE) liquid 237 mL  237 mL Oral TID BM Bonnielee Haff, MD   237 mL at 12/22/15 1000  . guaiFENesin (ROBITUSSIN) 100 MG/5ML solution 300 mg  15 mL Oral Q6H Kelvin Cellar, MD   300 mg at 12/22/15 0925  . haloperidol lactate (HALDOL) injection 2.5 mg  2.5 mg Intravenous Q6H PRN Bonnielee Haff, MD      . insulin aspart (novoLOG) injection 0-9 Units  0-9 Units Subcutaneous Q4H Collene Gobble, MD   2 Units at 12/22/15 0028  . levothyroxine (SYNTHROID, LEVOTHROID) tablet 75 mcg  75 mcg Oral QAC breakfast Lauren D Bajbus, RPH   75 mcg at 12/22/15 0916  . nicotine (NICODERM CQ - dosed in mg/24  hours) patch 14 mg  14 mg Transdermal Daily Bonnielee Haff, MD      . ondansetron (ZOFRAN) tablet 4 mg  4 mg Oral Q6H PRN Reyne Dumas, MD       Or  . ondansetron (ZOFRAN) injection 4 mg  4 mg Intravenous Q6H PRN Reyne Dumas, MD      . pantoprazole sodium (PROTONIX) 40 mg/20 mL oral suspension 40 mg  40 mg Per Tube Q1200 Kara Mead V, MD   40 mg at 12/21/15 1445  . polyethylene glycol (MIRALAX / GLYCOLAX) packet 17 g  17 g Oral Daily PRN Reyne Dumas, MD      . RESOURCE THICKENUP CLEAR   Oral PRN Bonnielee Haff, MD      . sodium chloride 0.9 % bolus 500 mL  500 mL Intravenous Once Reyne Dumas, MD   500 mL at 12/08/15 1555  . sodium chloride flush (NS) 0.9 % injection 10-40 mL  10-40 mL Intracatheter PRN Bonnielee Haff, MD   10 mL at 12/21/15 0323  . sodium chloride flush (NS) 0.9 % injection 3 mL  3 mL Intravenous Q12H Kelvin Cellar, MD   3 mL at 12/22/15 0925  . valproic acid (DEPAKENE) 250 MG/5ML syrup 750 mg  750 mg Oral TID Lucious Groves, DO   750 mg at 12/22/15 0918    Musculoskeletal: Strength & Muscle Tone: decreased Gait & Station: unable to stand Patient leans: N/A  Psychiatric Specialty Exam: Review of Systems  Unable to perform  ROS    Blood pressure 120/77, pulse 79, temperature 97.9 F (36.6 C), temperature source Oral, resp. rate 16, height 5' 7"  (1.702 m), weight 88.6 kg (195 lb 5.2 oz), SpO2 96 %.Body mass index is 30.59 kg/(m^2).  General Appearance: Bizarre and Disheveled  Eye Contact::  Fair  Speech:  Garbled and Slurred  Volume:  Decreased  Mood:  Anxious and Depressed  Affect:  Non-Congruent  Thought Process:  Disorganized  Orientation:  Negative  Thought Content:  Delusions  Suicidal Thoughts:  No  Homicidal Thoughts:  No  Memory:  Negative  Judgement:  Poor  Insight:  Negative  Psychomotor Activity:  Restlessness  Concentration:  Poor  Recall:  Poor  Fund of Knowledge:Poor  Language: Poor  Akathisia:  Yes  Handed:  Right  AIMS (if indicated):     Assets:  Others:  differed  ADL's:  Impaired  Cognition: Impaired,  Severe  Sleep:      Treatment Plan Summary: Patient continued to be delirious state, poor historian at this time  Continue Abilify 15 mg PO BID if she can tolerate Continue Cogentin 0.5 mA twice daily for EPS Continue Depakene 750 mg IV TD for bipolar mania, (VPAd level is 92 which is normal range) Continue Synthroid 75 mcg daily for hypothyroidsm Discontinue sedating medication to prevent further deterioration of delirium and confusion Abilify Maintena 400 mg QMonth - does not know her last dose and due date  Appreciate psychiatric consultation and follow up as clinically required Please contact [phone] or [phone] if needs further assistance  Disposition: Patient was not able to participate adult daily living so she may not be appropriate for inpatient psychiatric hospitalization. Patient may benefit from the med psych facility if available. Patient does not meet criteria for psychiatric inpatient admission. Supportive therapy provided about ongoing stressors.  Durward Parcel., MD 12/22/2015 11:18 AM

## 2015-12-22 NOTE — Progress Notes (Signed)
[facility] PROGRESS NOTE  [name] [id number] DOB: 06/12/1961 DOA: 12/08/2015  PCP: Benito Mccreedy, MD  Brief HPI: 55 yo female with bipolar disorder, [psychosis was admitted with weakness and cough. Her renal function declined and she was moved to [facility] for PCCM to place HD cath. She is not competent to sign for procedure and is estranged from her family due to life long mental illness. She was unable to cooperate for HD insertion therefore she was moved to ICU, sedated, intubated and HD cath placed. She was dialyzed by nephrology. Patient was subsequently extubated. Patient remained delirious.  Past medical history:  Past Medical History  Diagnosis Date  . Anemia 07/10/2011  . Thrombocytopenia ([place]) 07/10/2011  . HTN (hypertension)   . COPD (chronic obstructive pulmonary disease) ([place])   . Hyperlipidemia   . Hypothyroidism   . Bipolar affective ([place])   . DM (diabetes mellitus) ([place])   . Renal insufficiency   . 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 Hypertension     Consultants: Nephrology, Psychiatry  STUDIES:  4/10 CXR > Patchy bilateral lower lobe atelectasis or pneumonia. Mild cardiomegaly. 4/11 CXR > Interim partial clearing of bibasilar atelectasis and/or infiltrates. Cardiomegaly. 4/12 CT Head w/o Contrast > Mild diffuse atrophy for age. No intracranial mass, hemorrhage, or focal gray - white compartment lesion. Extensive mastoid air cell disease bilaterally as well as multifocal paranasal sinus disease. 4/14 Renal US > Increased echotexture within the kidneys bilaterally compatible chronic medical renal disease. 6.2 cm minimally complex cyst in the left kidney. No hydronephrosis. 4/15 CXR > Low lung volumes with probable bibasilar subsegmental atelectasis and small left pleural effusion. 4/18 CXR > Slight interval deterioration in the appearance of the chest with worsening of interstitial edema and small pleural  effusions  CULTURES: Blood 4/10 >> NG final Urine 4/10 >> 1,000 colonies/mL insignificant growth Sputum 4/15 >> normal flora 4/10 Influenza panel (-) 4/10 Strep pneumo urinary antigen (-) 4/10 Legionella urinary antigen (-) 4/10 MRSA PCR (-)  ANTIBIOTICS: 4/10 Levaquin>>4/17 4/10 Vancomycin >>4/11 4/10 Primaxin >>4/11 4/15 Vancomycin restarted >>4/17  SIGNIFICANT EVENTS: 4/15 transfer to [facility]  4/17- precedex added 4/18 extubated  LINES/TUBES: 4/15 Right IJ HD catheter Foley catheter OG tube ETT > 4/18 PIV x 2 4/15 ett>>>4/18  Subjective: Patient appears to be less agitated today. Still somewhat confused, but able to follow commands better than she was yesterday. Wants to go out and smoke.   Objective:  Vital Signs  Filed Vitals:   12/21/15 0516 12/21/15 1457 12/21/15 2043 12/22/15 0448  BP: 135/79 122/70 124/82 120/77  Pulse: 88 88 96 79  Temp: 98.5 F (36.9 C) 98.4 F (36.9 C) 98.4 F (36.9 C) 97.9 F (36.6 C)  TempSrc: Oral Oral Oral Oral  Resp: 18 20 18 16   Height:      Weight:      SpO2: 94% 97% 92% 96%    Intake/Output Summary (Last 24 hours) at 12/22/15 0933 Last data filed at 12/22/15 0417  Gross per 24 hour  Intake    480 ml  Output   2450 ml  Net  -1970 ml   Filed Weights   12/18/15 0608 12/19/15 0539 12/20/15 0419  Weight: 91 kg (200 lb 9.9 oz) 93.3 kg (205 lb 11 oz) 88.6 kg (195 lb 5.2 oz)    General appearance: Less Agitated. More responsive. No distress. Resp: Improving air entry bilaterally without any crackles, wheezing or rhonchi.  Cardio: regular  rate and rhythm, S1, S2 normal, no murmur, click, rub or gallop GI: soft, non-tender; bowel sounds normal; no masses,  no organomegaly Neurologic: Patient is less agitated. Following commands. Moving all her extremities.   Lab Results:  Data Reviewed: I have personally reviewed following labs and imaging studies  CBC:  Recent Labs Lab 12/17/15 0455 12/18/15 0504 12/19/15 0415  12/20/15 0518 12/21/15 0323  WBC 5.8 7.3 6.0 5.8 7.7  HGB 8.8* 9.0* 8.3* 8.5* 8.6*  HCT 28.3* 29.6* 26.5* 27.5* 27.6*  MCV 102.9* 103.9* 102.7* 104.2* 102.2*  PLT 44* 57* 62* 79* 102*    Basic Metabolic Panel:  Recent Labs Lab 12/16/15 1648 12/17/15 0455 12/17/15 1657 12/18/15 0504 12/18/15 1636 12/19/15 0415 12/20/15 0518 12/21/15 0323 12/22/15 0619  NA  --  150* 149* 146*  --  146* 144 142 142  K  --  3.8 3.9 3.9  --  3.5 3.4* 3.8 3.5  CL  --  109 105 103  --  104 102 102 99*  CO2  --  32 34* 33*  --  32 34* 30 30  GLUCOSE  --  113* 96 92  --  66 98 92 74  BUN  --  75* 80* 87*  --  81* 66* 64* 57*  CREATININE  --  2.99* 3.09* 3.35*  --  3.38* 3.15* 3.41* 3.40*  CALCIUM  --  8.7* 8.7* 8.7*  --  8.8* 8.5* 9.1 8.9  MG 2.2 1.8 1.7 1.7 1.8  --   --   --   --   PHOS 4.6 4.5  4.5 4.9* 5.0*  4.8* 5.2* 4.8* 4.8* 4.2 4.4    GFR: Estimated Creatinine Clearance: 20.9 mL/min (by C-G formula based on Cr of 3.4).  Liver Function Tests:  Recent Labs Lab 12/18/15 0504 12/19/15 0415 12/20/15 0518 12/21/15 0323 12/22/15 0619  ALBUMIN 1.6* 1.6* 1.5* 1.6* 1.8*   CBG:  Recent Labs Lab 12/21/15 1703 12/21/15 2025 12/22/15 0015 12/22/15 0407 12/22/15 0749  GLUCAP 139* 198* 154* 85 83    Urine analysis:    Component Value Date/Time   COLORURINE YELLOW 12/12/2015 1245   APPEARANCEUR CLOUDY* 12/12/2015 1245   LABSPEC 1.013 12/12/2015 1245   PHURINE 5.5 12/12/2015 1245   GLUCOSEU NEGATIVE 12/12/2015 1245   HGBUR MODERATE* 12/12/2015 1245   BILIRUBINUR NEGATIVE 12/12/2015 1245   KETONESUR NEGATIVE 12/12/2015 1245   PROTEINUR 100* 12/12/2015 1245   UROBILINOGEN 0.2 06/28/2015 0343   NITRITE NEGATIVE 12/12/2015 1245   LEUKOCYTESUR NEGATIVE 12/12/2015 1245    Recent Results (from the past 240 hour(s))  Culture, expectorated sputum-assessment     Status: None   Collection Time: 12/13/15  4:55 PM  Result Value Ref Range Status   Specimen Description SPUTUM  Final    Special Requests NONE  Final   Sputum evaluation   Final    THIS SPECIMEN IS ACCEPTABLE. RESPIRATORY CULTURE REPORT TO FOLLOW.   Report Status 12/13/2015 FINAL  Final  Culture, respiratory (NON-Expectorated)     Status: None   Collection Time: 12/13/15  4:55 PM  Result Value Ref Range Status   Specimen Description SPUTUM  Final   Special Requests NONE  Final   Gram Stain   Final    ABUNDANT WBC PRESENT, PREDOMINANTLY PMN FEW SQUAMOUS EPITHELIAL CELLS PRESENT ABUNDANT GRAM POSITIVE COCCI IN PAIRS IN CLUSTERS Performed at [facility]    Culture   Final    NORMAL OROPHARYNGEAL FLORA Performed at [facility]  Report Status 12/17/2015 FINAL  Final      Radiology Studies: No results found.   Medications:  Scheduled: . amantadine  100 mg Oral BID  . antiseptic oral rinse  7 mL Mouth Rinse q12n4p  . ARIPiprazole  15 mg Oral BH-qamhs  . benztropine  0.5 mg Oral BID  . feeding supplement (ENSURE ENLIVE)  237 mL Oral TID BM  . guaiFENesin  15 mL Oral Q6H  . insulin aspart  0-9 Units Subcutaneous Q4H  . levothyroxine  75 mcg Oral QAC breakfast  . pantoprazole sodium  40 mg Per Tube Q1200  . sodium chloride  500 mL Intravenous Once  . sodium chloride flush  3 mL Intravenous Q12H  . valproic acid  750 mg Oral TID   Continuous: . dextrose 75 mL/hr at 12/21/15 1349   [id number] (TYLENOL) oral liquid 160 mg/5 mL, acetaminophen **OR** acetaminophen, albuterol, budesonide (PULMICORT) nebulizer solution, haloperidol lactate, ondansetron **OR** ondansetron (ZOFRAN) IV, polyethylene glycol, RESOURCE THICKENUP CLEAR, sodium chloride flush  Assessment/Plan:  Principal Problem:   Bipolar disorder, current episode manic severe with psychotic features ([place]) Active Problems:   HTN (hypertension)   COPD (chronic obstructive pulmonary disease) (HCC)   Chronic kidney disease   Prediabetes   COPD with acute exacerbation ([place])   HCAP (healthcare-associated  pneumonia)   Renal failure   Acute encephalopathy   Sepsis ([place])   Acute respiratory failure (HCC)    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 Patient's renal function continued to get worse. Nephrology was consulted. Renal ultrasound did not show any hydronephrosis. Nephrology recommended transferring the patient to [facility] to undergo dialysis. Patient was admitted to the intensive care unit. She was intubated so that she could undergo placement of dialysis catheter. She was dialyzed. Renal function started improving. She was taken off of dialysis. Creatinine was higher yesterday than the day before. Stable today compared to yesterday. Continues to have good urine output. If her renal function remains stable tomorrow, we will discontinue the dialysis catheter.   Acute encephalopathy/delirium Mental status is slowly improving. She is not as agitated as before. Patient has a significant history of mental health disorders. She has bipolar disorder and schizoaffective disorder. She has been admitted to behavioral health in the past. No family is available. CT head did not show any acute findings. It appears that her delirium is mainly due to medical reasons including renal failure. Her underlying psychiatric illness is likely contributing as well. Continue her psychotropic medications. Cortisol level is normal. TSH was 4.54. Free T4 0.85.  COPD exacerbation, resolved Suspect precipitated by infectious process as chest x-ray showed patchy bilateral lower lobe pneumonia. Now improved. Off of steroids.  Community acquire pneumonia, completed course of antibiotics.  Patient presented with complaints of shortness of breath and cough with chest x-ray showing findings consistent with pneumonia. No clear-cut evidence for sepsis. Patient was given antibiotics.  She has completed course.  Bipolar disorder/schizoaffective disorder Psychiatry has seen. She is on multiple psychotropic medications. She is on Haldol intravenously.  Transient Hypotension Low blood pressure was noted while she was in the intensive care unit. She required pressors. Stable now. Etiology is unclear.  Thrombocytopenia Etiology is unclear. Counts continue to improve. No overt bleeding. Continue to monitor. No heparin products.  Macrocytic anemia Hemoglobin is stable. TSH was slightly elevated. Free T4 is normal. B-12 level is high  and folic acid level is normal.   History of hypothyroidism Continue levothyroxine. TSH is elevated and free T4 was normal.  Nutrition Patient pulled out her NG tube 4/20. Now on Oral diet.  DVT Prophylaxis: SCDs    Code Status: Full code  Family Communication: No family is available  Disposition Plan: Patient's mental status appears to be slowly improving. Renal function is stable. She has good urine output. She will likely need placement to skilled nursing facility for rehabilitation when medically ready for discharge. Mental status needs to improve some more. Also await input from psychiatry    LOS: 14 days   [place] [facility] Pager [phone] 12/22/2015, 9:33 AM  If 7PM-7AM, please contact night-coverage at [email or web address], password [email or web address]

## 2015-12-22 NOTE — Progress Notes (Addendum)
Speech Language Pathology Treatment: Dysphagia  Patient Details Name: KASELYN NELSEN MRN: [id number] DOB: 25-May-1961 Today's Date: 12/22/2015 Time: [id number]  SLP Time Calculation (min) (ACUTE ONLY): 10 min  Assessment / Plan / Recommendation Clinical Impression  Pt observed to tolerate thin liquids via bottle and straw excluding on cough over many trials. She is now fully alert and asking for better foods "meat and fish" but refused trials of soft solids with SLP. Will upgrade to dys 1/thin liquids and f/u to trial soft solids.    HPI HPI: 55 yo, hx bipolar with acute on chronic renal failure admitted 4/10 with sepsis secondary to HCAP. Her renal function declined so she was transferred to [facility] to place HD cath. Pt was sedated/intubated for the procedure and remained intubated 4/15-4/17.      SLP Plan  Continue with current plan of care     Recommendations  Diet recommendations: Dysphagia 1 (puree);Thin liquid Liquids provided via: Cup;Straw Medication Administration: Crushed with puree Supervision: Full supervision/cueing for compensatory strategies;Staff to assist with self feeding Compensations: Slow rate;Small sips/bites;Minimize environmental distractions Postural Changes and/or Swallow Maneuvers: Seated upright 90 degrees             Oral Care Recommendations: Oral care BID Follow up Recommendations: Skilled Nursing facility Plan: Continue with current plan of care     [place] Javeria Briski, MA CCC-SLP [id number]  Lynann Beaver 12/22/2015, 2:12 PM

## 2015-12-23 LAB — RENAL FUNCTION PANEL
Albumin: 1.8 g/dL — ABNORMAL LOW (ref 3.5–5.0)
Anion gap: 8 (ref 5–15)
BUN: 50 mg/dL — ABNORMAL HIGH (ref 6–20)
CALCIUM: 9.1 mg/dL (ref 8.9–10.3)
CHLORIDE: 106 mmol/L (ref 101–111)
CO2: 30 mmol/L (ref 22–32)
CREATININE: 3.23 mg/dL — AB (ref 0.44–1.00)
GFR calc non Af Amer: 15 mL/min — ABNORMAL LOW (ref >60)
GFR, EST AFRICAN AMERICAN: 17 mL/min — AB (ref >60)
GLUCOSE: 168 mg/dL — AB (ref 65–99)
Phosphorus: 4.1 mg/dL (ref 2.5–4.6)
Potassium: 4.6 mmol/L (ref 3.5–5.1)
SODIUM: 144 mmol/L (ref 135–145)

## 2015-12-23 LAB — GLUCOSE, CAPILLARY
GLUCOSE-CAPILLARY: 61 mg/dL — AB (ref 65–99)
GLUCOSE-CAPILLARY: 276 mg/dL — AB (ref 65–99)
GLUCOSE-CAPILLARY: 114 mg/dL — AB (ref 65–99)
Glucose-Capillary: 140 mg/dL — ABNORMAL HIGH (ref 65–99)
Glucose-Capillary: 162 mg/dL — ABNORMAL HIGH (ref 65–99)
Glucose-Capillary: 178 mg/dL — ABNORMAL HIGH (ref 65–99)
Glucose-Capillary: 182 mg/dL — ABNORMAL HIGH (ref 65–99)

## 2015-12-23 NOTE — Progress Notes (Signed)
[facility] PROGRESS NOTE  [name] [id number] DOB: 01-14-1961 DOA: 12/08/2015  PCP: Benito Mccreedy, MD  Brief HPI: 55 yo female with bipolar disorder, psychosis was admitted with weakness and cough. Her renal function declined and she was moved to [facility] for PCCM to place HD cath. She is not competent to sign for procedure and is estranged from her family due to life long mental illness. She was unable to cooperate for HD insertion therefore she was moved to ICU, sedated, intubated and HD cath placed. She was dialyzed by nephrology. Patient was subsequently extubated. Patient has been delirious. However, her mentation appears to be improving slowly.  Past medical history:  Past Medical History  Diagnosis Date  . Anemia 07/10/2011  . Thrombocytopenia ([place]) 07/10/2011  . HTN (hypertension)   . COPD (chronic obstructive pulmonary disease) ([place])   . Hyperlipidemia   . Hypothyroidism   . Bipolar affective ([place])   . DM (diabetes mellitus) ([place])   . Renal insufficiency   . Cancer ([place]) 2012    RIGHT BREAST, RADIATION DONE, NO CHEMO  . Hypertension     Consultants: Nephrology, Psychiatry  STUDIES:  4/10 CXR > Patchy bilateral lower lobe atelectasis or pneumonia. Mild cardiomegaly. 4/11 CXR > Interim partial clearing of bibasilar atelectasis and/or infiltrates. Cardiomegaly. 4/12 CT Head w/o Contrast > Mild diffuse atrophy for age. No intracranial mass, hemorrhage, or focal gray - white compartment lesion. Extensive mastoid air cell disease bilaterally as well as multifocal paranasal sinus disease. 4/14 Renal US > Increased echotexture within the kidneys bilaterally compatible chronic medical renal disease. 6.2 cm minimally complex cyst in the left kidney. No hydronephrosis. 4/15 CXR > Low lung volumes with probable bibasilar subsegmental atelectasis and small left pleural effusion. 4/18 CXR > Slight interval deterioration in the appearance of the chest with  worsening of interstitial edema and small pleural effusions  CULTURES: Blood 4/10 >> NG final Urine 4/10 >> 1,000 colonies/mL insignificant growth Sputum 4/15 >> normal flora 4/10 Influenza panel (-) 4/10 Strep pneumo urinary antigen (-) 4/10 Legionella urinary antigen (-) 4/10 MRSA PCR (-)  ANTIBIOTICS: 4/10 Levaquin>>4/17 4/10 Vancomycin >>4/11 4/10 Primaxin >>4/11 4/15 Vancomycin restarted >>4/17  SIGNIFICANT EVENTS: 4/15 transfer to [facility]  4/17- precedex added 4/18 extubated  LINES/TUBES: 4/15 Right IJ HD catheter Foley catheter OG tube ETT > 4/18 PIV x 2 4/15 ett>>>4/18  Subjective: Patient much improved this morning. Not agitated. She is hard of hearing which makes communication difficult. She was able to tell me that the year was 2017. She did not know where she was. Denies any pain.   Objective:  Vital Signs  Filed Vitals:   12/22/15 0448 12/22/15 1416 12/22/15 2017 12/23/15 0437  BP: 120/77 121/69 130/79 120/73  Pulse: 79 83 89 75  Temp: 97.9 F (36.6 C) 98.2 F (36.8 C) 97.9 F (36.6 C) 97.6 F (36.4 C)  TempSrc: Oral Oral  Oral  Resp: 16 20 18 18   Height:      Weight:      SpO2: 96% 90% 90% 96%    Intake/Output Summary (Last 24 hours) at 12/23/15 1004 Last data filed at 12/23/15 0700  Gross per 24 hour  Intake    780 ml  Output   2450 ml  Net  -1670 ml   Filed Weights   12/18/15 0608 12/19/15 0539 12/20/15 0419  Weight: 91 kg (200 lb 9.9 oz) 93.3 kg (205 lb 11 oz) 88.6 kg (195 lb 5.2 oz)    General appearance:  Awake, alert. More responsive. No distress. Resp: Improving air entry bilaterally without any crackles, wheezing or rhonchi.  Cardio: regular rate and rhythm, S1, S2 normal, no murmur, click, rub or gallop GI: soft, non-tender; bowel sounds normal; no masses,  no organomegaly Neurologic: Patient is less agitated. Oriented to year. Following commands. Moving all her extremities.   Lab Results:  Data Reviewed: I have personally  reviewed following labs and imaging studies  CBC:  Recent Labs Lab 12/17/15 0455 12/18/15 0504 12/19/15 0415 12/20/15 0518 12/21/15 0323  WBC 5.8 7.3 6.0 5.8 7.7  HGB 8.8* 9.0* 8.3* 8.5* 8.6*  HCT 28.3* 29.6* 26.5* 27.5* 27.6*  MCV 102.9* 103.9* 102.7* 104.2* 102.2*  PLT 44* 57* 62* 79* 102*    Basic Metabolic Panel:  Recent Labs Lab 12/16/15 1648  12/17/15 0455 12/17/15 1657 12/18/15 0504 12/18/15 1636 12/19/15 0415 12/20/15 0518 12/21/15 0323 12/22/15 0619 12/23/15 0817  NA  --   < > 150* 149* 146*  --  146* 144 142 142 144  K  --   < > 3.8 3.9 3.9  --  3.5 3.4* 3.8 3.5 4.6  CL  --   < > 109 105 103  --  104 102 102 99* 106  CO2  --   < > 32 34* 33*  --  32 34* 30 30 30   GLUCOSE  --   < > 113* 96 92  --  66 98 92 74 168*  BUN  --   < > 75* 80* 87*  --  81* 66* 64* 57* 50*  CREATININE  --   < > 2.99* 3.09* 3.35*  --  3.38* 3.15* 3.41* 3.40* 3.23*  CALCIUM  --   < > 8.7* 8.7* 8.7*  --  8.8* 8.5* 9.1 8.9 9.1  MG 2.2  --  1.8 1.7 1.7 1.8  --   --   --   --   --   PHOS 4.6  --  4.5  4.5 4.9* 5.0*  4.8* 5.2* 4.8* 4.8* 4.2 4.4 4.1  < > = values in this interval not displayed.  GFR: Estimated Creatinine Clearance: 22 mL/min (by C-G formula based on Cr of 3.23).  Liver Function Tests:  Recent Labs Lab 12/19/15 0415 12/20/15 0518 12/21/15 0323 12/22/15 0619 12/23/15 0817  ALBUMIN 1.6* 1.5* 1.6* 1.8* 1.8*   CBG:  Recent Labs Lab 12/22/15 2017 12/23/15 0017 12/23/15 0450 12/23/15 0539 12/23/15 0725  GLUCAP 156* 178* 61* 140* 162*    Urine analysis:    Component Value Date/Time   COLORURINE YELLOW 12/12/2015 1245   APPEARANCEUR CLOUDY* 12/12/2015 1245   LABSPEC 1.013 12/12/2015 1245   PHURINE 5.5 12/12/2015 1245   GLUCOSEU NEGATIVE 12/12/2015 1245   HGBUR MODERATE* 12/12/2015 1245   BILIRUBINUR NEGATIVE 12/12/2015 1245   KETONESUR NEGATIVE 12/12/2015 1245   PROTEINUR 100* 12/12/2015 1245   UROBILINOGEN 0.2 06/28/2015 0343   NITRITE NEGATIVE  12/12/2015 1245   LEUKOCYTESUR NEGATIVE 12/12/2015 1245    Recent Results (from the past 240 hour(s))  Culture, expectorated sputum-assessment     Status: None   Collection Time: 12/13/15  4:55 PM  Result Value Ref Range Status   Specimen Description SPUTUM  Final   Special Requests NONE  Final   Sputum evaluation   Final    THIS SPECIMEN IS ACCEPTABLE. RESPIRATORY CULTURE REPORT TO FOLLOW.   Report Status 12/13/2015 FINAL  Final  Culture, respiratory (NON-Expectorated)     Status: None   Collection Time: 12/13/15  4:55 PM  Result Value Ref Range Status   Specimen Description SPUTUM  Final   Special Requests NONE  Final   Gram Stain   Final    ABUNDANT WBC PRESENT, PREDOMINANTLY PMN FEW SQUAMOUS EPITHELIAL CELLS PRESENT ABUNDANT GRAM POSITIVE COCCI IN PAIRS IN CLUSTERS Performed at [facility]    Culture   Final    NORMAL OROPHARYNGEAL FLORA Performed at [facility]    Report Status 12/17/2015 FINAL  Final      Radiology Studies: No results found.   Medications:  Scheduled: . amantadine  100 mg Oral BID  . antiseptic oral rinse  7 mL Mouth Rinse q12n4p  . ARIPiprazole  15 mg Oral BH-qamhs  . benztropine  0.5 mg Oral BID  . feeding supplement (ENSURE ENLIVE)  237 mL Oral TID BM  . guaiFENesin  15 mL Oral Q6H  . insulin aspart  0-9 Units Subcutaneous Q4H  . levothyroxine  75 mcg Oral QAC breakfast  . nicotine  14 mg Transdermal Daily  . pantoprazole sodium  40 mg Per Tube Q1200  . sodium chloride  500 mL Intravenous Once  . sodium chloride flush  3 mL Intravenous Q12H  . valproic acid  750 mg Oral TID   Continuous: . dextrose 50 mL/hr at 12/22/15 [id number]   [id number] (TYLENOL) oral liquid 160 mg/5 mL, acetaminophen **OR** acetaminophen, albuterol, budesonide (PULMICORT) nebulizer solution, haloperidol lactate, ondansetron **OR** ondansetron (ZOFRAN) IV, polyethylene glycol, RESOURCE THICKENUP CLEAR, sodium chloride  flush  Assessment/Plan:  Principal Problem:   Bipolar disorder, current episode manic severe with psychotic features ([place]) Active Problems:   HTN (hypertension)   COPD (chronic obstructive pulmonary disease) (HCC)   Chronic kidney disease   Prediabetes   COPD with acute exacerbation ([place])   HCAP (healthcare-associated pneumonia)   Renal failure   Acute encephalopathy   Sepsis ([place])   Acute respiratory failure ([place])    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 Patient's renal function continued to get worse. Nephrology was consulted. Renal ultrasound did not show any hydronephrosis. Nephrology recommended transferring the patient to [facility] to undergo dialysis. Patient was admitted to the intensive care unit. She was intubated so that she could undergo placement of dialysis catheter. She was dialyzed. She was subsequently extubated. Renal function started improving. She was taken off of dialysis. Creatinine improving. She has very good urine output. Okay to discontinue the dialysis catheter. Okay to remove Foley catheter as well.   Acute encephalopathy/delirium Mental status is slowly improving. She is not as agitated as before. Patient has a significant history of mental health disorders. She has bipolar disorder and schizoaffective disorder. She has been admitted to behavioral health in the past. No family is available. CT head did not show any acute findings. It appears that her delirium is mainly due to medical reasons including renal failure. Her underlying psychiatric illness is likely contributing as well. Continue her psychotropic medications. Cortisol level is normal. TSH was 4.54. Free T4 0.85. Psychiatry is following.  COPD exacerbation, resolved Suspect precipitated by infectious process as chest x-ray showed patchy bilateral  lower lobe pneumonia. Now improved. Off of steroids.  Community acquire pneumonia, completed course of antibiotics.  Patient presented with complaints of shortness of breath and cough with chest x-ray showing findings consistent with pneumonia. No clear-cut evidence for sepsis. Patient was given antibiotics. She has  completed course.  Bipolar disorder/schizoaffective disorder Psychiatry has seen. She is on multiple psychotropic medications. She is on Haldol intravenously PRN.  Transient Hypotension Low blood pressure was noted while she was in the intensive care unit. She required pressors. Stable now. Etiology is unclear.  Thrombocytopenia Etiology is unclear. Counts continue to improve. No overt bleeding. Continue to monitor. No heparin products. Recheck tomorrow morning.  Macrocytic anemia Hemoglobin is stable. TSH was slightly elevated. Free T4 is normal. B-12 level is high and folic acid level is normal.   History of hypothyroidism Continue levothyroxine. TSH is elevated and free T4 was normal.  Nutrition Patient pulled out her NG tube 4/20. Now on Oral diet.  DVT Prophylaxis: SCDs    Code Status: Full code  Family Communication: No family is available  Disposition Plan: Patient's mental status appears to be improving. Renal function is improving/stable. She has good urine output. She will likely need placement to skilled nursing facility for rehabilitation when medically ready for discharge. Psychiatry is considering a med psych disposition. Start mobilizing patient. Remove Foley catheter.    LOS: 15 days   [place] [facility] Pager [phone] 12/23/2015, 10:04 AM  If 7PM-7AM, please contact night-coverage at [email or web address], password [facility]

## 2015-12-23 NOTE — Progress Notes (Signed)
CBG recheck 140

## 2015-12-23 NOTE — Progress Notes (Signed)
Physical Therapy Treatment Patient Details Name: [name] MRN: [id number] DOB: 1961-03-21 Today's Date: 12/23/2015    History of Present Illness pt presents with Acute on Chronic Renal Failure and Metabolic Encephalopathy.  pt with hx of Bipolar Schizoaffective Disorder, COPD, HTN, and CKD.      PT Comments    Patient more alert and interactive today. Follows simple 1 step commands with multimodal cues and increased time. Tolerated standing and SPT to chair with assist of 2. Speech continues to be mostly unintelligible. Continue to recommend St SNF to maximize mobility. Will follow.  Follow Up Recommendations  SNF     Equipment Recommendations  None recommended by PT    Recommendations for Other Services       Precautions / Restrictions Precautions Precautions: Fall Precaution Comments: sitter Restrictions Weight Bearing Restrictions: No    Mobility  Bed Mobility Overal bed mobility: Needs Assistance Bed Mobility: Supine to Sit     Supine to sit: Mod assist;HOB elevated     General bed mobility comments: Pt reaching arms up for assist to get to EOB. Assist with trunk.  Transfers Overall transfer level: Needs assistance Equipment used: 2 person hand held assist Transfers: Sit to/from [facility] Sit to Stand: Max assist;+2 physical assistance Stand pivot transfers: Max assist;+2 physical assistance       General transfer comment: Assist of 2 to stand from EOB with Bil LEs locked out into knee extension. SPT to chair Max A of 2 for balance/support due to weakness.  Ambulation/Gait                 Stairs            Wheelchair Mobility    Modified Rankin (Stroke Patients Only)       Balance Overall balance assessment: Needs assistance Sitting-balance support: Feet supported;No upper extremity supported Sitting balance-Leahy Scale: Fair Sitting balance - Comments: Able to sit EOB without external support for ~6  minutes.   Standing balance support: During functional activity Standing balance-Leahy Scale: Zero Standing balance comment: Requires assist of 2 for support.                     Cognition Arousal/Alertness: Awake/alert Behavior During Therapy: Flat affect;Restless Overall Cognitive Status: No family/caregiver present to determine baseline cognitive functioning                      Exercises General Exercises - Lower Extremity Long Arc Quad: Both;15 reps;Seated    General Comments        Pertinent Vitals/Pain Pain Assessment: Faces Faces Pain Scale: No hurt    Home Living                      Prior Function            PT Goals (current goals can now be found in the care plan section) Progress towards PT goals: Progressing toward goals    Frequency  Min 2X/week    PT Plan Current plan remains appropriate    Co-evaluation             End of Session Equipment Utilized During Treatment: Gait belt Activity Tolerance: Patient tolerated treatment well Patient left: in chair;with call bell/phone within reach;with chair alarm set;with nursing/sitter in room     Time: 0930-0950 PT Time Calculation (min) (ACUTE ONLY): 20 min  Charges:  $Therapeutic Activity: 8-22 mins  G Codes:      Lacie Draft 12/23/2015, 10:46 AM Wray Kearns, PT, DPT [phone]

## 2015-12-23 NOTE — Progress Notes (Addendum)
Dr. [place] Pink paged to make aware of creatinine 3.23 and BUN 50 per verbal order this morning. Also to clarify if patient still needs hemo cath and foley catheter. Orders placed

## 2015-12-23 NOTE — Progress Notes (Signed)
Nutrition Follow-up  DOCUMENTATION CODES:   Obesity unspecified  INTERVENTION:   -Ensure Enlive po TID, each supplement provides 350 kcal and 20 grams of protein  NUTRITION DIAGNOSIS:   Inadequate oral intake related to lethargy/confusion, dysphagia as evidenced by meal completion < 25%.  Progressing  GOAL:   Patient will meet greater than or equal to 90% of their needs  Progressing  MONITOR:   PO intake, Supplement acceptance, Diet advancement, Weight trends, Skin, Labs, I & O's  REASON FOR ASSESSMENT:   Consult Enteral/tube feeding initiation and management  ASSESSMENT:   55 year old female with a history of COPD, hypertension, chronic kidney disease stage III, bipolar disorder, who presents to the ER because of malaise and shortness of breath and cough for the last 2 weeks. She has a history of COPD and smokes. Her MDI inhaler have not provided her any relief from her shortness of breath. She denies any fever chills, rigors. Patient has significant difficulty communicating because of her bipolar disorder. However she is awake alert but disoriented. He is requesting her medications repeatedly. Therefore the patient's history is obtained from the chart. Patient is a resident of [facility].  SLP re-evaluated on 12/22/15; pt was advanced to a dysphagia 1 diet with thin liquids. Meal completion 5-25%.   Pt sitting in recliner char at time of visit. When RD greeted pt, she responded "I can't hear you nor understand what you're saying to me". Pt very difficult to understand, responds mostly in unintelligible phrases.   Spoke with sitter at bedside, who reports she tolerated breakfast well. She reports pt consumed mostly liquids off tray (coffee, orange juice, and milk), but did not consume the eggs. Pt consuming Ensure shake at time of visit; pt reports she likes the flavor of Ensure. RD will continue order.   Reviewed nephrology note from  12/19/15; pt with great urine output and nephrology signed off. Pt is not a good HD candididate due to mental health issues.   Labs reviewed: CBGS: 61-276.   Diet Order:  DIET - DYS 1 Room service appropriate?: Yes; Fluid consistency:: Thin  Skin:  Reviewed, no issues  Last BM:  12/23/15  Height:   Ht Readings from Last 1 Encounters:  12/13/15 5\' 7"  (1.702 m)    Weight:   Wt Readings from Last 1 Encounters:  12/20/15 195 lb 5.2 oz (88.6 kg)    Ideal Body Weight:  56.82 kg (kg)  BMI:  Body mass index is 30.59 kg/(m^2).  Estimated Nutritional Needs:   Kcal:  1600-1800  Protein:  70-80 grams  Fluid:  1.6-1.8 L  EDUCATION NEEDS:   No education needs identified at this time  Andreina Outten A. Jimmye Norman, RD, LDN, CDE Pager: [phone] After hours Pager: [phone]

## 2015-12-23 NOTE — Progress Notes (Signed)
Pt CBG 61. Gave pt carton of milk, pt drank all of it. Pt requested some orange juice, pt drank 2 orange juices. Will recheck cbg.

## 2015-12-24 LAB — CBC
HEMATOCRIT: 28.4 % — AB (ref 36.0–46.0)
HEMOGLOBIN: 8.9 g/dL — AB (ref 12.0–15.0)
MCH: 32.6 pg (ref 26.0–34.0)
MCHC: 31.3 g/dL (ref 30.0–36.0)
MCV: 104 fL — ABNORMAL HIGH (ref 78.0–100.0)
Platelets: 131 10*3/uL — ABNORMAL LOW (ref 150–400)
RBC: 2.73 MIL/uL — AB (ref 3.87–5.11)
RDW: 14 % (ref 11.5–15.5)
WBC: 6.5 10*3/uL (ref 4.0–10.5)

## 2015-12-24 LAB — GLUCOSE, CAPILLARY
GLUCOSE-CAPILLARY: 49 mg/dL — AB (ref 65–99)
GLUCOSE-CAPILLARY: 128 mg/dL — AB (ref 65–99)
GLUCOSE-CAPILLARY: 136 mg/dL — AB (ref 65–99)
GLUCOSE-CAPILLARY: 155 mg/dL — AB (ref 65–99)
Glucose-Capillary: 137 mg/dL — ABNORMAL HIGH (ref 65–99)
Glucose-Capillary: 170 mg/dL — ABNORMAL HIGH (ref 65–99)
Glucose-Capillary: 68 mg/dL (ref 65–99)

## 2015-12-24 LAB — RENAL FUNCTION PANEL
ALBUMIN: 2 g/dL — AB (ref 3.5–5.0)
ANION GAP: 10 (ref 5–15)
BUN: 47 mg/dL — AB (ref 6–20)
CALCIUM: 9.5 mg/dL (ref 8.9–10.3)
CO2: 30 mmol/L (ref 22–32)
Chloride: 100 mmol/L — ABNORMAL LOW (ref 101–111)
Creatinine, Ser: 3.12 mg/dL — ABNORMAL HIGH (ref 0.44–1.00)
GFR calc Af Amer: 18 mL/min — ABNORMAL LOW (ref >60)
GFR calc non Af Amer: 15 mL/min — ABNORMAL LOW (ref >60)
GLUCOSE: 231 mg/dL — AB (ref 65–99)
PHOSPHORUS: 3.6 mg/dL (ref 2.5–4.6)
Potassium: 4.5 mmol/L (ref 3.5–5.1)
SODIUM: 140 mmol/L (ref 135–145)

## 2015-12-24 MED ORDER — HALOPERIDOL LACTATE 5 MG/ML IJ SOLN
2.0000 mg | Freq: Once | INTRAMUSCULAR | Status: AC
  Administered 2015-12-24: 2 mg via INTRAVENOUS
  Filled 2015-12-24: qty 1

## 2015-12-24 MED ORDER — GLUCOSE 40 % PO GEL
ORAL | Status: AC
  Filled 2015-12-24: qty 1

## 2015-12-24 MED ORDER — HALOPERIDOL LACTATE 5 MG/ML IJ SOLN
2.5000 mg | Freq: Four times a day (QID) | INTRAMUSCULAR | Status: DC | PRN
  Administered 2015-12-24 – 2015-12-25 (×2): 2.5 mg via INTRAMUSCULAR
  Filled 2015-12-24 (×2): qty 1

## 2015-12-24 MED ORDER — DEXTROSE 50 % IV SOLN
INTRAVENOUS | Status: AC
  Administered 2015-12-24: 50 mL
  Filled 2015-12-24: qty 50

## 2015-12-24 NOTE — Progress Notes (Signed)
Hypoglycemic Event  CBG: 68  Treatment: 15 GM carbohydrate snack  Symptoms: None  Follow-up CBG: Ti: 0600CBG Result:49  Possible Reasons for Event: Other: loss iv access  Comments/MD notified:on call:  Dr Dereck Leep

## 2015-12-24 NOTE — Consult Note (Signed)
   [facility] CM Inpatient Consult   12/24/2015  AYUMI DIP 1961-01-30 [id number] Patient screened for potential [facility] services. Patient is eligible for [facility]. Chart review reveals patient's discharge plan is for a skilled facility care and  there were no identifiable [facility] care management needs at this time.  Patient is currently agitated.  [facility] Care Management services not appropriate at this time. If patient's post hospital needs changes please place a [facility] Care Management consult. For questions please contact:  Natividad Brood, RN BSN [facility] Liaison  [phone] business mobile phone Toll free office [phone]

## 2015-12-24 NOTE — Progress Notes (Signed)
[facility] PROGRESS NOTE  [name] [id number] DOB: 07-20-1961 DOA: 12/08/2015  PCP: Benito Mccreedy, MD  Brief HPI: 55 yo female with bipolar disorder, psychosis was admitted with weakness and cough. Her renal function declined and she was moved to [facility] for PCCM to place HD cath. She is not competent to sign for procedure and is estranged from her family due to life long mental illness. She was unable to cooperate for HD insertion therefore she was moved to ICU, sedated, intubated and HD cath placed. She was dialyzed by nephrology. Patient was subsequently extubated. Patient has been delirious. However, her mentation appears to be getting back to baseline  Past medical history:  Past Medical History  Diagnosis Date  . Anemia 07/10/2011  . Thrombocytopenia ([place]) 07/10/2011  . HTN (hypertension)   . COPD (chronic obstructive pulmonary disease) ([place])   . Hyperlipidemia   . Hypothyroidism   . Bipolar affective ([place])   . DM (diabetes mellitus) ([place])   . Renal insufficiency   . 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 Hypertension     Consultants: Nephrology, Psychiatry  STUDIES:  4/10 CXR > Patchy bilateral lower lobe atelectasis or pneumonia. Mild cardiomegaly. 4/11 CXR > Interim partial clearing of bibasilar atelectasis and/or infiltrates. Cardiomegaly. 4/12 CT Head w/o Contrast > Mild diffuse atrophy for age. No intracranial mass, hemorrhage, or focal gray - white compartment lesion. Extensive mastoid air cell disease bilaterally as well as multifocal paranasal sinus disease. 4/14 Renal US > Increased echotexture within the kidneys bilaterally compatible chronic medical renal disease. 6.2 cm minimally complex cyst in the left kidney. No hydronephrosis. 4/15 CXR > Low lung volumes with probable bibasilar subsegmental atelectasis and small left pleural effusion. 4/18 CXR > Slight interval deterioration in the appearance of the chest with  worsening of interstitial edema and small pleural effusions  CULTURES: Blood 4/10 >> NG final Urine 4/10 >> 1,000 colonies/mL insignificant growth Sputum 4/15 >> normal flora 4/10 Influenza panel (-) 4/10 Strep pneumo urinary antigen (-) 4/10 Legionella urinary antigen (-) 4/10 MRSA PCR (-)  ANTIBIOTICS: 4/10 Levaquin>>4/17 4/10 Vancomycin >>4/11 4/10 Primaxin >>4/11 4/15 Vancomycin restarted >>4/17  SIGNIFICANT EVENTS: 4/15 transfer to [facility]  4/17- precedex added 4/18 extubated  LINES/TUBES: 4/15 Right IJ HD catheter Foley catheter OG tube ETT > 4/18 PIV x 2 4/15 ett>>>4/18  Subjective: Patient has been more alert and nursing reports patient has been trying to get up and out of bed. They're concerned about discharge and whether or not patient will require a sitter  Objective:  Vital Signs  Filed Vitals:   12/23/15 1337 12/23/15 2251 12/23/15 2300 12/24/15 0508  BP: 114/97 107/42  126/77  Pulse: 84 72  104  Temp: 97.6 F (36.4 C) 97.8 F (36.6 C)  97.6 F (36.4 C)  TempSrc: Oral Oral  Oral  Resp: 21 18  18   Height:      Weight:      SpO2: 95% 87% 95% 92%    Intake/Output Summary (Last 24 hours) at 12/24/15 1332 Last data filed at 12/24/15 1200  Gross per 24 hour  Intake 735.83 ml  Output   1250 ml  Net -514.17 ml   Filed Weights   12/18/15 0608 12/19/15 0539 12/20/15 0419  Weight: 91 kg (200 lb 9.9 oz) 93.3 kg (205 lb 11 oz) 88.6 kg (195 lb 5.2 oz)    General appearance: Awake, alert. Resp:Equal chest rise, no wheezes, no rales Cardio: regular  rate and rhythm, S1, S2 normal, no murmur, click, rub or gallop GI: soft, non-tender; bowel sounds normal; no masses,  no organomegaly Neurologic: Patient is less agitated.  Moving all her extremities.   Lab Results:  Data Reviewed: I have personally reviewed following labs and imaging studies  CBC:  Recent Labs Lab 12/18/15 0504 12/19/15 0415 12/20/15 0518 12/21/15 0323 12/24/15 0900  WBC 7.3  6.0 5.8 7.7 6.5  HGB 9.0* 8.3* 8.5* 8.6* 8.9*  HCT 29.6* 26.5* 27.5* 27.6* 28.4*  MCV 103.9* 102.7* 104.2* 102.2* 104.0*  PLT 57* 62* 79* 102* 131*    Basic Metabolic Panel:  Recent Labs Lab 12/17/15 1657 12/18/15 0504 12/18/15 1636  12/20/15 0518 12/21/15 0323 12/22/15 0619 12/23/15 0817 12/24/15 0728  NA 149* 146*  --   < > 144 142 142 144 140  K 3.9 3.9  --   < > 3.4* 3.8 3.5 4.6 4.5  CL 105 103  --   < > 102 102 99* 106 100*  CO2 34* 33*  --   < > 34* 30 30 30 30   GLUCOSE 96 92  --   < > 98 92 74 168* 231*  BUN 80* 87*  --   < > 66* 64* 57* 50* 47*  CREATININE 3.09* 3.35*  --   < > 3.15* 3.41* 3.40* 3.23* 3.12*  CALCIUM 8.7* 8.7*  --   < > 8.5* 9.1 8.9 9.1 9.5  MG 1.7 1.7 1.8  --   --   --   --   --   --   PHOS 4.9* 5.0*  4.8* 5.2*  < > 4.8* 4.2 4.4 4.1 3.6  < > = values in this interval not displayed.  GFR: Estimated Creatinine Clearance: 22.7 mL/min (by C-G formula based on Cr of 3.12).  Liver Function Tests:  Recent Labs Lab 12/20/15 0518 12/21/15 0323 12/22/15 0619 12/23/15 0817 12/24/15 0728  ALBUMIN 1.5* 1.6* 1.8* 1.8* 2.0*   CBG:  Recent Labs Lab 12/24/15 0044 12/24/15 0531 12/24/15 0616 12/24/15 0659 12/24/15 1155  GLUCAP 137* 68 49* 170* 128*    Urine analysis:    Component Value Date/Time   COLORURINE YELLOW 12/12/2015 1245   APPEARANCEUR CLOUDY* 12/12/2015 1245   LABSPEC 1.013 12/12/2015 1245   PHURINE 5.5 12/12/2015 1245   GLUCOSEU NEGATIVE 12/12/2015 1245   HGBUR MODERATE* 12/12/2015 1245   BILIRUBINUR NEGATIVE 12/12/2015 1245   KETONESUR NEGATIVE 12/12/2015 1245   PROTEINUR 100* 12/12/2015 1245   UROBILINOGEN 0.2 06/28/2015 0343   NITRITE NEGATIVE 12/12/2015 1245   LEUKOCYTESUR NEGATIVE 12/12/2015 1245    No results found for this or any previous visit (from the past 240 hour(s)).    Radiology Studies: No results found.   Medications:  Scheduled: . amantadine  100 mg Oral BID  . antiseptic oral rinse  7 mL Mouth  Rinse q12n4p  . ARIPiprazole  15 mg Oral BH-qamhs  . benztropine  0.5 mg Oral BID  . dextrose      . feeding supplement (ENSURE ENLIVE)  237 mL Oral TID BM  . guaiFENesin  15 mL Oral Q6H  . insulin aspart  0-9 Units Subcutaneous Q4H  . levothyroxine  75 mcg Oral QAC breakfast  . nicotine  14 mg Transdermal Daily  . pantoprazole sodium  40 mg Per Tube Q1200  . sodium chloride  500 mL Intravenous Once  . sodium chloride flush  3 mL Intravenous Q12H  . valproic acid  750 mg Oral TID  Continuous: . dextrose 50 mL/hr at 12/22/15 [id number]   [id number] (TYLENOL) oral liquid 160 mg/5 mL, acetaminophen **OR** acetaminophen, albuterol, budesonide (PULMICORT) nebulizer solution, haloperidol lactate, ondansetron **OR** ondansetron (ZOFRAN) IV, polyethylene glycol, RESOURCE THICKENUP CLEAR, sodium chloride flush  Assessment/Plan:  Principal Problem:   Bipolar disorder, current episode manic severe with psychotic features ([place]) Active Problems:   HTN (hypertension)   COPD (chronic obstructive pulmonary disease) (HCC)   Chronic kidney disease   Prediabetes   COPD with acute exacerbation ([place])   HCAP (healthcare-associated pneumonia)   Renal failure   Acute encephalopathy   Sepsis ([place])   Acute respiratory failure ([place])    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 Patient's renal function continued to get worse. Nephrology was consulted. Renal ultrasound did not show any hydronephrosis. Nephrology recommended transferring the patient to [facility] to undergo dialysis. Patient was admitted to the intensive care unit. She was intubated so that she could undergo placement of dialysis catheter. She was dialyzed. She was subsequently extubated. Renal function started improving. She was taken off of dialysis. Creatinine improving. She has very good  urine output. Okay to discontinue the dialysis catheter. Okay to remove Foley catheter as well.  - Serum creatinine trending down currently.  Acute encephalopathy/delirium Mental status is slowly improving. She is not as agitated as before. Patient has a significant history of mental health disorders. She has bipolar disorder and schizoaffective disorder. She has been admitted to behavioral health in the past. No family is available. CT head did not show any acute findings. It appears that her delirium is mainly due to medical reasons including renal failure. Her underlying psychiatric illness is likely contributing as well. Continue her psychotropic medications. Cortisol level is normal. TSH was 4.54. Free T4 0.85. Psychiatry is following. - Getting back to baseline but still having periods of agitation as such continue Haldol  COPD exacerbation, resolved Suspect precipitated by infectious process as chest x-ray showed patchy bilateral lower lobe pneumonia. Now improved. Off of steroids.  Community acquire pneumonia, completed course of antibiotics.  Patient presented with complaints of shortness of breath and cough with chest x-ray showing findings consistent with pneumonia. No clear-cut evidence for sepsis. Patient was given antibiotics. She has completed course.  Bipolar disorder/schizoaffective disorder Psychiatry has seen. She is on multiple psychotropic medications. She is on Haldol intravenously PRN.  Transient Hypotension Low blood pressure was noted while she was in the intensive care unit. She required pressors. Stable now. Etiology is unclear.  Thrombocytopenia Etiology is unclear. Counts continue to improve. No overt bleeding. Continue to monitor. No heparin products. Recheck tomorrow morning.  Macrocytic anemia Hemoglobin is stable. TSH was slightly elevated. Free T4 is normal. B-12 level is high and folic acid level is normal.   History of hypothyroidism Continue  levothyroxine. TSH is elevated and free T4 was normal.  Nutrition Patient pulled out her NG tube 4/20. Now on Oral diet.  DVT Prophylaxis: SCDs    Code Status: Full code  Family Communication: No family is available  Disposition Plan: Will start discharge planning    LOS: 16 days   Velvet Bathe  [facility] Pager [phone]  12/24/2015, 1:32 PM  If 7PM-7AM, please contact night-coverage at [email or web address], password [facility]

## 2015-12-24 NOTE — Progress Notes (Signed)
Inpatient Diabetes Program Recommendations  AACE/ADA: New Consensus Statement on Inpatient Glycemic Control (2015)  Target Ranges:  Prepandial:   less than 140 mg/dL      Peak postprandial:   less than 180 mg/dL (1-2 hours)      Critically ill patients:  140 - 180 mg/dL   Results for [name], [name] (MRN [id number]) as of 12/24/2015 08:37  Ref. Range 12/23/2015 20:25 12/24/2015 00:44 12/24/2015 05:31 12/24/2015 06:16 12/24/2015 06:59  Glucose-Capillary Latest Ref Range: 65-99 mg/dL 182 (H) 137 (H) 68 49 (L) 170 (H)   Review of Glycemic Control  Diabetes history: DM 2 Outpatient Diabetes medications: None Current orders for Inpatient glycemic control: Novolog sensitive  Inpatient Diabetes Program Recommendations:   Patient is hypoglycemia this am in the 60's and 40's this am. Patient was covered with 1 unit of insulin for a glucose in 130's. Needs more custom scale that is more sensitive.  Please consider creating a custom Novolog Correction that starts with a glucose of 150.  < 150 = 0 units 150 - 200 = 1 unit 201-250 = 2 units 251-300 = 3 units 301-350 = 4 units 351 - 400 = 5 units > 400 call MD  Thanks,  Tama Headings RN, MSN, [facility] Inpatient Diabetes Coordinator Team Pager [phone] (8a-5p)

## 2015-12-24 NOTE — Progress Notes (Signed)
Contacted Dr Alcario Drought RE: CBG.  He ordered an amp of D 50% given if IV team successful in getting a line in. New IV in. Dextrose given.  Patient remains asymptomatic.

## 2015-12-24 NOTE — Progress Notes (Signed)
Pt very agitated this afternoon. Therapeutic communication used and redirection tried but patient continuing to try and get out of bed. Pt has been told on multiple occasions that she cannot get out of bed at the moment since she is too weak. Pt attempted to ambulate earlier in the day with x2 assist but was unable to support any of her weight. Pt got angry with me (sitter) and started swinging her hands at me telling me to "get away, and leave me alone."

## 2015-12-25 LAB — RENAL FUNCTION PANEL
ALBUMIN: 1.8 g/dL — AB (ref 3.5–5.0)
Anion gap: 8 (ref 5–15)
BUN: 43 mg/dL — AB (ref 6–20)
CALCIUM: 9.4 mg/dL (ref 8.9–10.3)
CO2: 32 mmol/L (ref 22–32)
Chloride: 100 mmol/L — ABNORMAL LOW (ref 101–111)
Creatinine, Ser: 3.25 mg/dL — ABNORMAL HIGH (ref 0.44–1.00)
GFR calc Af Amer: 17 mL/min — ABNORMAL LOW (ref >60)
GFR calc non Af Amer: 15 mL/min — ABNORMAL LOW (ref >60)
GLUCOSE: 176 mg/dL — AB (ref 65–99)
PHOSPHORUS: 4.1 mg/dL (ref 2.5–4.6)
POTASSIUM: 4.6 mmol/L (ref 3.5–5.1)
SODIUM: 140 mmol/L (ref 135–145)

## 2015-12-25 LAB — GLUCOSE, CAPILLARY
Glucose-Capillary: 130 mg/dL — ABNORMAL HIGH (ref 65–99)
Glucose-Capillary: 164 mg/dL — ABNORMAL HIGH (ref 65–99)
Glucose-Capillary: 44 mg/dL — CL (ref 65–99)
Glucose-Capillary: 46 mg/dL — ABNORMAL LOW (ref 65–99)
Glucose-Capillary: 85 mg/dL (ref 65–99)

## 2015-12-25 MED ORDER — DEXTROSE 50 % IV SOLN
INTRAVENOUS | Status: AC
  Filled 2015-12-25: qty 50

## 2015-12-25 NOTE — Clinical Social Work Placement (Signed)
   CLINICAL SOCIAL WORK PLACEMENT  NOTE  Date:  12/25/2015  Patient Details  Name: [name] MRN: [id number] Date of Birth: 1961-05-11  Clinical Social Work is seeking post-discharge placement for this patient at the [place] level of care (*CSW will initial, date and re-position this form in  chart as items are completed):  Yes   Patient/family provided with [place] Work Department's list of facilities offering this level of care within the geographic area requested by the patient (or if unable, by the patient's family).  Yes   Patient/family informed of their freedom to choose among providers that offer the needed level of care, that participate in Medicare, Medicaid or managed care program needed by the patient, have an available bed and are willing to accept the patient.  Yes   Patient/family informed of [place]'s ownership interest in [facility] and [facility], as well as of the fact that they are under no obligation to receive care at these facilities.  PASRR submitted to EDS on 12/12/15     PASRR number received on       Existing PASRR number confirmed on       FL2 transmitted to all facilities in geographic area requested by pt/family on 12/12/15     FL2 transmitted to all facilities within larger geographic area on       Patient informed that his/her managed care company has contracts with or will negotiate with certain facilities, including the following:        Yes   Patient/family informed of bed offers received.  Patient chooses bed at [facility]     Physician recommends and patient chooses bed at      Patient to be transferred to [facility] on 12/25/15.  Patient to be transferred to facility by [facility]     Patient family notified on 12/25/15 of transfer.  Name of family member notified:  Leda Gauze     PHYSICIAN       Additional Comment:    _______________________________________________ Benard Halsted, [place] 12/25/2015, 3:10 PM

## 2015-12-25 NOTE — Progress Notes (Signed)
Patient will DC to: [facility] Anticipated DC date: 12/25/15 Family notified: Payee, Leda Gauze Transport by: Corey Harold   Per MD patient ready for DC to SNF. RN, patient, patient's payee, and facility notified of DC. RN given number for report. DC packet on chart. Ambulance transport requested for patient.   CSW signing off.  Cedric Fishman, [place] Social Worker [phone]

## 2015-12-25 NOTE — Progress Notes (Signed)
Inpatient Diabetes Program Recommendations  AACE/ADA: New Consensus Statement on Inpatient Glycemic Control (2015)  Target Ranges:  Prepandial:   less than 140 mg/dL      Peak postprandial:   less than 180 mg/dL (1-2 hours)      Critically ill patients:  140 - 180 mg/dL   Results for JALYSSA, MAMONE (MRN [id number]) as of 12/25/2015 07:33  Ref. Range 12/24/2015 06:59 12/24/2015 11:55 12/24/2015 20:43 12/25/2015 00:27 12/25/2015 04:54  Glucose-Capillary Latest Ref Range: 65-99 mg/dL 170 (H) 128 (H) 155 (H) 130 (H) 44 (LL)   Review of Glycemic Control  Diabetes history: DM 2 Outpatient Diabetes medications: None Current orders for Inpatient glycemic control: Novolog Sensitive Q4hrs  Inpatient Diabetes Program Recommendations:    Patient has had recurrent hypoglycemia in the am in the 60's and 40's. Consider the need for a more custom scale that is more sensitive and adjusted to ACHS frequency.  Please consider creating a custom Novolog Correction that starts with a glucose of 150.  < 150 = 0 units 150 - 200 = 1 unit 201-250 = 2 units 251-300 = 3 units 301-350 = 4 units 351 - 400 = 5 units > 400 call MD  Thanks,  Tama Headings RN, MSN, [facility] Inpatient Diabetes Coordinator Team Pager [phone] (8a-5p)

## 2015-12-25 NOTE — Care Management Important Message (Signed)
Important Message  Patient Details  Name: [name] MRN: [id number] Date of Birth: February 26, 1961   Medicare Important Message Given:  Yes    Enjoli Tidd Abena 12/25/2015, 1:56 PM

## 2015-12-25 NOTE — Progress Notes (Signed)
Hypoglycemic Event  CBG: 47  Treatment: 15 GM carbohydrate snack  Symptoms: None  Follow-up CBG: Time:0450 CBG Result:44  Possible Reasons for Event: Unknown  Comments/MD notified:Dr [place] notified.  Awaiting call back.    Danielle Dess

## 2015-12-25 NOTE — Progress Notes (Signed)
Speech Language Pathology Treatment: Dysphagia  Patient Details Name: [name] MRN: [id number] DOB: Jun 10, 1961 Today's Date: 12/25/2015 Time: 0900-0920 SLP Time Calculation (min) (ACUTE ONLY): 20 min  Assessment / Plan / Recommendation Clinical Impression  Pt seen during am meal, complaining about pureed foods and trying to call someone unsuccessfully to  Bring her different foods. SLP attempted to communicate with pt without appropriate response. Pt is apparently hard of hearing, but also oppositional. Any attempts to reposition or cue were resisted. Pt did consume graham crackers placed on her tray, but after several crackers mastication and transit slowed and pt had some significant choking and gagging episodes. Recommend attempting a dys 2 diet to improve pts satisfaction of foods, but also reduce risk of choking with solids. SLP will f/u for tolerance.    HPI HPI: 55 yo, hx bipolar with acute on chronic renal failure admitted 4/10 with sepsis secondary to HCAP. Her renal function declined so she was transferred to [facility] to place HD cath. Pt was sedated/intubated for the procedure and remained intubated 4/15-4/17.      SLP Plan  Continue with current plan of care     Recommendations  Diet recommendations: Dysphagia 2 (fine chop);Thin liquid Liquids provided via: Cup;Straw Medication Administration: Crushed with puree Supervision: Full supervision/cueing for compensatory strategies;Staff to assist with self feeding Compensations: Slow rate;Small sips/bites;Minimize environmental distractions Postural Changes and/or Swallow Maneuvers: Seated upright 90 degrees             Oral Care Recommendations: Oral care BID Follow up Recommendations: Skilled Nursing facility Plan: Continue with current plan of care     [place] Anne Mckillop, MA CCC-SLP [id number]  [name] 12/25/2015, 10:13 AM

## 2015-12-25 NOTE — Discharge Summary (Signed)
Physician Discharge Summary  [name] [id number] DOB: 07-31-1961 DOA: 12/08/2015  PCP: Benito Mccreedy, MD  Admit date: 12/08/2015 Discharge date: 12/25/2015  Time spent: > 35 minutes  Recommendations for Outpatient Follow-up:  1. Please follow up with outpatient psychiatry 2. Monitor renal function   Discharge Diagnoses:  Principal Problem:   Bipolar disorder, current episode manic severe with psychotic features ([place]) Active Problems:   HTN (hypertension)   COPD (chronic obstructive pulmonary disease) ([facility])   Chronic kidney disease   Prediabetes   COPD with acute exacerbation ([place])   HCAP (healthcare-associated pneumonia)   Renal failure   Acute encephalopathy   Sepsis ([place])   Acute respiratory failure ([place])   Discharge Condition: stable  Diet recommendation: Dysphagia 2  Filed Weights   12/18/15 0608 12/19/15 0539 12/20/15 0419  Weight: 91 kg (200 lb 9.9 oz) 93.3 kg (205 lb 11 oz) 88.6 kg (195 lb 5.2 oz)    History of present illness:  55 yo female with bipolar disorder, psychosis was admitted with weakness and cough. Her renal function declined and she was moved to [place] for PCCM to place HD cath. She is not competent to sign for procedure and is estranged from her family due to life long mental illness. She was unable to cooperate for HD insertion therefore she was moved to ICU, sedated, intubated and HD cath placed. She was dialyzed by nephrology. Patient was subsequently extubated.   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 Patient's renal function continued to get worse. Nephrology was consulted. Renal ultrasound did not show any hydronephrosis. Nephrology recommended transferring the patient to [facility] to undergo dialysis. Patient was admitted to the intensive care unit. She was  intubated so that she could undergo placement of dialysis catheter. She was dialyzed. She was subsequently extubated. Renal function started improving. She was taken off of dialysis.   - Serum creatinine stable  Acute encephalopathy/delirium Mental status is slowly improving. She is not as agitated as before. Patient has a significant history of mental health disorders. She has bipolar disorder and schizoaffective disorder. She has been admitted to behavioral health in the past. No family is available. CT head did not show any acute findings. It appears that her delirium is mainly due to medical reasons including renal failure. Her underlying psychiatric illness is likely contributing as well. Continue her psychotropic medications. Cortisol level is normal. TSH was 4.54. Free T4 0.85. Psychiatry is following. - Back at baseline  COPD exacerbation, resolved Suspect precipitated by infectious process as chest x-ray showed patchy bilateral lower lobe pneumonia. Now improved. Off of steroids.  Community acquire pneumonia, completed course of antibiotics.  Patient presented with complaints of shortness of breath and cough with chest x-ray showing findings consistent with pneumonia. No clear-cut evidence for sepsis. Patient was given antibiotics. She has completed course.  Bipolar disorder/schizoaffective disorder Psychiatry has seen. She is on multiple psychotropic medications.  Transient Hypotension Low blood pressure was noted while she was in the intensive care unit. She required pressors. Stable now. Etiology is unclear.  Thrombocytopenia Etiology is unclear. Counts continue to improve. No overt bleeding.  No heparin products.   Macrocytic anemia Hemoglobin is stable. TSH was slightly elevated. Free T4 is normal. B-12 level is high and folic acid level is normal.   History of hypothyroidism Continue levothyroxine. TSH is elevated and free T4  was normal.  Nutrition Patient pulled out her  NG tube 4/20. Now on Oral diet.  Procedures:  Please see above  Consultations:  None  Discharge Exam: Filed Vitals:   12/24/15 2206 12/25/15 0628  BP: 138/74 109/81  Pulse: 65 67  Temp: 98.7 F (37.1 C) 97.3 F (36.3 C)  Resp: 16 18    General: Pt in nad, alert and awake Cardiovascular: rrr, no rubs Respiratory: no increased wob, no wheezes  Discharge Instructions   Discharge Instructions    Call MD for:  extreme fatigue    Complete by:  As directed      Call MD for:  temperature >100.4    Complete by:  As directed      Diet - low sodium heart healthy    Complete by:  As directed      Increase activity slowly    Complete by:  As directed           Current Discharge Medication List    CONTINUE these medications which have NOT CHANGED   Details  acetaminophen (TYLENOL) 500 MG tablet Take 1 tablet (500 mg total) by mouth every 6 (six) hours as needed. Qty: 30 tablet, Refills: 0    albuterol (PROVENTIL HFA;VENTOLIN HFA) 108 (90 BASE) MCG/ACT inhaler Inhale 1-2 puffs into the lungs every 6 (six) hours as needed for wheezing or shortness of breath.    amantadine (SYMMETREL) 100 MG capsule Take 100 mg by mouth 2 (two) times daily.    ARIPiprazole (ABILIFY) 15 MG tablet Take 1 tablet (15 mg total) by mouth 2 (two) times daily in the am and at bedtime.Otho Darner: 60 tablet, Refills: 0    atorvastatin (LIPITOR) 40 MG tablet Take 1 tablet (40 mg total) by mouth at bedtime.    benztropine (COGENTIN) 0.5 MG tablet Take 1 tablet (0.5 mg total) by mouth 2 (two) times daily. Qty: 60 tablet, Refills: 0    !! divalproex (DEPAKOTE) 250 MG DR tablet Take 3 tablets (750 mg total) by mouth at bedtime. Qty: 90 tablet, Refills: 0    !! divalproex (DEPAKOTE) 500 MG DR tablet Take 1 tablet (500 mg total) by mouth 2 (two) times daily at 8am and 3pm. Qty: 60 tablet, Refills: 0    Fluticasone-Salmeterol (ADVAIR) 250-50 MCG/DOSE AEPB Inhale 1 puff into the lungs 2 (two) times  daily. Qty: 60 each    hydrocortisone cream 1 % Apply topically 3 (three) times daily. Qty: 30 g, Refills: 0    levothyroxine (SYNTHROID, LEVOTHROID) 75 MCG tablet Take 1 tablet (75 mcg total) by mouth daily before breakfast. Qty: 30 tablet, Refills: 0    Multiple Vitamin (MULTIVITAMIN WITH MINERALS) TABS tablet Take 1 tablet by mouth daily.     !! - Potential duplicate medications found. Please discuss with provider.    STOP taking these medications     ARIPiprazole 400 MG SUSR      calcitRIOL (ROCALTROL) 0.25 MCG capsule      carvedilol (COREG) 3.125 MG tablet      gabapentin (NEURONTIN) 400 MG capsule      haloperidol (HALDOL) 5 MG tablet      hydrOXYzine (ATARAX/VISTARIL) 25 MG tablet      meloxicam (MOBIC) 7.5 MG tablet      nystatin ointment (MYCOSTATIN)      ramipril (ALTACE) 2.5 MG capsule      zolpidem (AMBIEN) 10 MG tablet        Allergies  Allergen Reactions  . Doxycycline Other (See  Comments)    REACTION: Unknown reaction  . Glipizide Other (See Comments)    unknown  . Ibuprofen Other (See Comments)    CANNOT TAKE IBUPROFEN OR MOTRIN DUE TO CURRENT MEDS  . Penicillins Other (See Comments)    Has patient had a PCN reaction causing immediate rash, facial/tongue/throat swelling, SOB or lightheadedness with hypotension: unknown Has patient had a PCN reaction causing severe rash involving mucus membranes or skin necrosis: unknown Has patient had a PCN reaction that required hospitalization Unknown Has patient had a PCN reaction occurring within the last 10 years: Unknown If all of the above answers are "NO", then may proceed with Cephalosporin use.   . Sulfonamide Derivatives     REACTION: Unknown reaction  . Tetracycline Other (See Comments)    REACTION: Unknown reaction  . Tolterodine Tartrate Itching  . Chlorostat [Chlorhexidine] Rash  . Cogentin [Benztropine] Rash  . Oxybutynin Chloride Itching and Rash      The results of significant  diagnostics from this hospitalization (including imaging, microbiology, ancillary and laboratory) are listed below for reference.    Significant Diagnostic Studies: Dg Chest 2 View  12/11/2015  CLINICAL DATA:  Pneumonia. EXAM: CHEST  2 VIEW COMPARISON:  12/08/2015 and 12/09/2015 FINDINGS: Patient slightly rotated to the left. Lungs are hypoinflated with continued interval improvement in subtle opacification in the right base and mild persistent opacification of the left base. Findings likely represent resolving atelectasis or infection. No definite effusion. Cardiomediastinal silhouette and remainder of the exam is unchanged. IMPRESSION: Improving bibasilar opacification likely resolving infection or atelectasis. Electronically Signed   By: Marin Olp M.D.   On: 12/11/2015 14:47   Ct Head Wo Contrast  12/10/2015  CLINICAL DATA:  Altered mental status/confusion EXAM: CT HEAD WITHOUT CONTRAST TECHNIQUE: Contiguous axial images were obtained from the base of the skull through the vertex without intravenous contrast. COMPARISON:  January 29, 2013 FINDINGS: There is mild diffuse atrophy for age, stable. There is no intracranial mass, hemorrhage, extra-axial fluid collection, or midline shift. The gray-white compartments appear normal. No acute infarct evident. The bony calvarium appears intact. There is opacification of most of the mastoid air cells bilaterally, more severe on the right than on the left. There is extensive mucosal thickening in the left maxillary antrum. There is opacification of multiple ethmoid air cells bilaterally. There is also diffuse opacity in the frontal sinuses bilaterally. No intraorbital lesions evident. IMPRESSION: Mild diffuse atrophy for age. No intracranial mass, hemorrhage, or focal gray - white compartment lesion. Extensive mastoid air cell disease bilaterally as well as multifocal paranasal sinus disease. Electronically Signed   By: Lowella Grip III M.D.   On: 12/10/2015  18:06   US Renal  12/12/2015  CLINICAL DATA:  Acute kidney injury. EXAM: RENAL / URINARY TRACT ULTRASOUND COMPLETE COMPARISON:  04/16/2013 FINDINGS: Right Kidney: Length: 12.3 cm. Diffusely increased echotexture. No mass or hydronephrosis. Left Kidney: Length: 13.3 cm. 6.2 cm cyst in the mid to lower pole. Area of calcifications noted along the posterior wall as seen on prior CT. Increased echotexture throughout the left kidney. No hydronephrosis. Bladder: Foley catheter in place, decompressed. IMPRESSION: Increased echotexture within the kidneys bilaterally compatible chronic medical renal disease. 6.2 cm minimally complex cyst in the left kidney. No hydronephrosis. Electronically Signed   By: Rolm Baptise M.D.   On: 12/12/2015 09:37   Dg Chest Port 1 View  12/16/2015  CLINICAL DATA:  Acute respiratory failure, COPD,, healthcare associated pneumonia, acute and chronic renal failure, sepsis.  EXAM: PORTABLE CHEST 1 VIEW COMPARISON:  Portable chest x-ray of December 13, 2015 FINDINGS: The lungs are reasonably well inflated. There is a trace of pleural fluid at the right lung base. There is a stable small left pleural effusion. The cardiac silhouette remains enlarged. The pulmonary vascularity is mildly engorged. The pulmonary interstitial markings are more conspicuous today. The endotracheal tube tip lies 3.7 cm above the carina. The esophagogastric tube tip projects below the inferior margin of the image. The dual-lumen dialysis type catheter tip projects over the proximal SVC. IMPRESSION: Slight interval deterioration in the appearance of the chest with worsening of interstitial edema and small pleural effusions. Electronically Signed   By: David  Martinique M.D.   On: 12/16/2015 07:09   Dg Chest Port 1 View  12/13/2015  CLINICAL DATA:  55 year old female with history of central line placement and intubation. EXAM: PORTABLE CHEST 1 VIEW COMPARISON:  Chest x-ray a 12/11/2015. FINDINGS: An endotracheal tube is in  place with tip 5.1 cm above the carina. There is a right-sided internal jugular Vas-Cath with tip terminating in the proximal to mid superior vena cava. Lung volumes are low. Bibasilar opacities (left greater than right) favored to predominantly reflect subsegmental atelectasis, although underlying airspace consolidation in the left lower lobe is not excluded. Small left pleural effusion. No pneumothorax. No evidence of pulmonary edema. Heart size is normal. The patient is rotated to the left on today's exam, resulting in distortion of the mediastinal contours and reduced diagnostic sensitivity and specificity for mediastinal pathology. Atherosclerosis in the thoracic aorta. IMPRESSION: 1. Support apparatus, as above. 2. Low lung volumes with probable bibasilar subsegmental atelectasis and small left pleural effusion. 3. Atherosclerosis. Electronically Signed   By: Vinnie Langton M.D.   On: 12/13/2015 17:40   Dg Chest Port 1 View  12/09/2015  CLINICAL DATA:  Sepsis. EXAM: PORTABLE CHEST 1 VIEW COMPARISON:  12/08/2015. FINDINGS: Mediastinum hilar structures normal. Stable cardiomegaly. Interim partial clearing of bibasilar atelectasis and/or infiltrates. No pleural effusion or pneumothorax. IMPRESSION: 1 interim partial clearing of bibasilar atelectasis and/or infiltrates. 2. Cardiomegaly. Electronically Signed   By: Marcello Moores  Register   On: 12/09/2015 07:13   Dg Chest Port 1 View  12/08/2015  CLINICAL DATA:  Mild lays, cough, shortness of breath for 2 days. COPD EXAM: PORTABLE CHEST 1 VIEW COMPARISON:  06/23/2015 FINDINGS: Mild cardiomegaly. Patchy bilateral lower lobe airspace opacities. No visible effusions. No acute bony abnormality. IMPRESSION: Patchy bilateral lower lobe atelectasis or pneumonia. Mild cardiomegaly. Electronically Signed   By: Rolm Baptise M.D.   On: 12/08/2015 12:40   Dg Abd Portable 1v  12/13/2015  CLINICAL DATA:  Orogastric tube placement EXAM: PORTABLE ABDOMEN - 1 VIEW COMPARISON:   Abdominal CT 04/16/2013 FINDINGS: An orogastric tube tip overlaps the gastric antral region. Normal visualized bowel gas pattern. IMPRESSION: Orogastric tube tip overlaps the distal stomach. Electronically Signed   By: Monte Fantasia M.D.   On: 12/13/2015 17:39    Microbiology: No results found for this or any previous visit (from the past 240 hour(s)).   Labs: Basic Metabolic Panel:  Recent Labs Lab 12/18/15 1636  12/21/15 0323 12/22/15 0619 12/23/15 0817 12/24/15 0728 12/25/15 0510  NA  --   < > 142 142 144 140 140  K  --   < > 3.8 3.5 4.6 4.5 4.6  CL  --   < > 102 99* 106 100* 100*  CO2  --   < > 30 30 30 30  32  GLUCOSE  --   < >  92 74 168* 231* 176*  BUN  --   < > 64* 57* 50* 47* 43*  CREATININE  --   < > 3.41* 3.40* 3.23* 3.12* 3.25*  CALCIUM  --   < > 9.1 8.9 9.1 9.5 9.4  MG 1.8  --   --   --   --   --   --   PHOS 5.2*  < > 4.2 4.4 4.1 3.6 4.1  < > = values in this interval not displayed. Liver Function Tests:  Recent Labs Lab 12/21/15 0323 12/22/15 0619 12/23/15 0817 12/24/15 0728 12/25/15 0510  ALBUMIN 1.6* 1.8* 1.8* 2.0* 1.8*   No results for input(s): LIPASE, AMYLASE in the last 168 hours. No results for input(s): AMMONIA in the last 168 hours. CBC:  Recent Labs Lab 12/19/15 0415 12/20/15 0518 12/21/15 0323 12/24/15 0900  WBC 6.0 5.8 7.7 6.5  HGB 8.3* 8.5* 8.6* 8.9*  HCT 26.5* 27.5* 27.6* 28.4*  MCV 102.7* 104.2* 102.2* 104.0*  PLT 62* 79* 102* 131*   Cardiac Enzymes: No results for input(s): CKTOTAL, CKMB, CKMBINDEX, TROPONINI in the last 168 hours. BNP: BNP (last 3 results)  Recent Labs  12/08/15 1205  BNP 556.7*    ProBNP (last 3 results) No results for input(s): PROBNP in the last 8760 hours.  CBG:  Recent Labs Lab 12/24/15 2043 12/25/15 0027 12/25/15 0454 12/25/15 0515 12/25/15 1239  GLUCAP 155* 130* 44* 85 164*    Signed:  Velvet Bathe MD.  [facility] 12/25/2015, 2:09 PM

## 2016-01-08 ENCOUNTER — Encounter (HOSPITAL_COMMUNITY): Payer: Self-pay | Admitting: *Deleted

## 2016-01-08 ENCOUNTER — Inpatient Hospital Stay (HOSPITAL_COMMUNITY)
Admission: EM | Admit: 2016-01-08 | Discharge: 2016-01-13 | DRG: 682 | Disposition: A | Discharge status: Hospice | Payer: Medicare Other | Attending: Internal Medicine | Admitting: Internal Medicine

## 2016-01-08 ENCOUNTER — Emergency Department (HOSPITAL_COMMUNITY): Payer: Medicare Other

## 2016-01-08 DIAGNOSIS — G9341 Metabolic encephalopathy: Secondary | ICD-10-CM | POA: Diagnosis present

## 2016-01-08 DIAGNOSIS — J449 Chronic obstructive pulmonary disease, unspecified: Secondary | ICD-10-CM | POA: Diagnosis present

## 2016-01-08 DIAGNOSIS — F319 Bipolar disorder, unspecified: Secondary | ICD-10-CM | POA: Diagnosis present

## 2016-01-08 DIAGNOSIS — Z515 Encounter for palliative care: Secondary | ICD-10-CM | POA: Diagnosis present

## 2016-01-08 DIAGNOSIS — N186 End stage renal disease: Secondary | ICD-10-CM | POA: Diagnosis present

## 2016-01-08 DIAGNOSIS — Z79899 Other long term (current) drug therapy: Secondary | ICD-10-CM

## 2016-01-08 DIAGNOSIS — F17210 Nicotine dependence, cigarettes, uncomplicated: Secondary | ICD-10-CM | POA: Diagnosis present

## 2016-01-08 DIAGNOSIS — E039 Hypothyroidism, unspecified: Secondary | ICD-10-CM | POA: Diagnosis present

## 2016-01-08 DIAGNOSIS — Z6833 Body mass index (BMI) 33.0-33.9, adult: Secondary | ICD-10-CM | POA: Diagnosis not present

## 2016-01-08 DIAGNOSIS — D696 Thrombocytopenia, unspecified: Secondary | ICD-10-CM | POA: Diagnosis present

## 2016-01-08 DIAGNOSIS — E1122 Type 2 diabetes mellitus with diabetic chronic kidney disease: Secondary | ICD-10-CM | POA: Diagnosis present

## 2016-01-08 DIAGNOSIS — F259 Schizoaffective disorder, unspecified: Secondary | ICD-10-CM | POA: Diagnosis present

## 2016-01-08 DIAGNOSIS — N179 Acute kidney failure, unspecified: Secondary | ICD-10-CM | POA: Diagnosis present

## 2016-01-08 DIAGNOSIS — I120 Hypertensive chronic kidney disease with stage 5 chronic kidney disease or end stage renal disease: Secondary | ICD-10-CM | POA: Diagnosis present

## 2016-01-08 DIAGNOSIS — R451 Restlessness and agitation: Secondary | ICD-10-CM | POA: Diagnosis present

## 2016-01-08 DIAGNOSIS — D631 Anemia in chronic kidney disease: Secondary | ICD-10-CM | POA: Diagnosis present

## 2016-01-08 DIAGNOSIS — E785 Hyperlipidemia, unspecified: Secondary | ICD-10-CM | POA: Diagnosis present

## 2016-01-08 DIAGNOSIS — Z781 Physical restraint status: Secondary | ICD-10-CM

## 2016-01-08 DIAGNOSIS — Z66 Do not resuscitate: Secondary | ICD-10-CM | POA: Diagnosis present

## 2016-01-08 DIAGNOSIS — E860 Dehydration: Secondary | ICD-10-CM | POA: Diagnosis present

## 2016-01-08 DIAGNOSIS — R5381 Other malaise: Secondary | ICD-10-CM | POA: Diagnosis present

## 2016-01-08 DIAGNOSIS — E46 Unspecified protein-calorie malnutrition: Secondary | ICD-10-CM | POA: Diagnosis present

## 2016-01-08 DIAGNOSIS — E875 Hyperkalemia: Secondary | ICD-10-CM | POA: Diagnosis present

## 2016-01-08 DIAGNOSIS — I10 Essential (primary) hypertension: Secondary | ICD-10-CM | POA: Diagnosis not present

## 2016-01-08 DIAGNOSIS — N189 Chronic kidney disease, unspecified: Secondary | ICD-10-CM | POA: Diagnosis not present

## 2016-01-08 DIAGNOSIS — Z853 Personal history of malignant neoplasm of breast: Secondary | ICD-10-CM | POA: Diagnosis not present

## 2016-01-08 LAB — CBC WITH DIFFERENTIAL/PLATELET
BASOS ABS: 0 10*3/uL (ref 0.0–0.1)
Basophils Relative: 0 %
EOS ABS: 0.1 10*3/uL (ref 0.0–0.7)
Eosinophils Relative: 2 %
HCT: 29.2 % — ABNORMAL LOW (ref 36.0–46.0)
HEMOGLOBIN: 9 g/dL — AB (ref 12.0–15.0)
LYMPHS PCT: 21 %
Lymphs Abs: 1.3 10*3/uL (ref 0.7–4.0)
MCH: 34.1 pg — AB (ref 26.0–34.0)
MCHC: 30.8 g/dL (ref 30.0–36.0)
MCV: 110.6 fL — ABNORMAL HIGH (ref 78.0–100.0)
MONO ABS: 0.4 10*3/uL (ref 0.1–1.0)
Monocytes Relative: 7 %
NEUTROS PCT: 70 %
Neutro Abs: 4.5 10*3/uL (ref 1.7–7.7)
PLATELETS: 99 10*3/uL — AB (ref 150–400)
RBC: 2.64 MIL/uL — ABNORMAL LOW (ref 3.87–5.11)
RDW: 19.6 % — ABNORMAL HIGH (ref 11.5–15.5)
WBC: 6.3 10*3/uL (ref 4.0–10.5)

## 2016-01-08 LAB — URINE MICROSCOPIC-ADD ON

## 2016-01-08 LAB — COMPREHENSIVE METABOLIC PANEL
ALBUMIN: 2.7 g/dL — AB (ref 3.5–5.0)
ALK PHOS: 93 U/L (ref 38–126)
ALT: 28 U/L (ref 14–54)
ANION GAP: 15 (ref 5–15)
AST: 48 U/L — ABNORMAL HIGH (ref 15–41)
BUN: 94 mg/dL — ABNORMAL HIGH (ref 6–20)
CALCIUM: 9.7 mg/dL (ref 8.9–10.3)
CO2: 24 mmol/L (ref 22–32)
CREATININE: 8.88 mg/dL — AB (ref 0.44–1.00)
Chloride: 102 mmol/L (ref 101–111)
GFR calc non Af Amer: 4 mL/min — ABNORMAL LOW (ref >60)
GFR, EST AFRICAN AMERICAN: 5 mL/min — AB (ref >60)
Glucose, Bld: 109 mg/dL — ABNORMAL HIGH (ref 65–99)
Potassium: 4.1 mmol/L (ref 3.5–5.1)
SODIUM: 141 mmol/L (ref 135–145)
Total Bilirubin: 0.8 mg/dL (ref 0.3–1.2)
Total Protein: 6.4 g/dL — ABNORMAL LOW (ref 6.5–8.1)

## 2016-01-08 LAB — CK: Total CK: 391 U/L — ABNORMAL HIGH (ref 38–234)

## 2016-01-08 LAB — IRON AND TIBC
IRON: 31 ug/dL (ref 28–170)
Saturation Ratios: 17 % (ref 10.4–31.8)
TIBC: 178 ug/dL — AB (ref 250–450)
UIBC: 147 ug/dL

## 2016-01-08 LAB — I-STAT CHEM 8, ED
BUN: 95 mg/dL — ABNORMAL HIGH (ref 6–20)
CALCIUM ION: 1.26 mmol/L — AB (ref 1.12–1.23)
Chloride: 101 mmol/L (ref 101–111)
Creatinine, Ser: 8.4 mg/dL — ABNORMAL HIGH (ref 0.44–1.00)
GLUCOSE: 106 mg/dL — AB (ref 65–99)
HCT: 32 % — ABNORMAL LOW (ref 36.0–46.0)
HEMOGLOBIN: 10.9 g/dL — AB (ref 12.0–15.0)
Potassium: 4 mmol/L (ref 3.5–5.1)
Sodium: 143 mmol/L (ref 135–145)
TCO2: 26 mmol/L (ref 0–100)

## 2016-01-08 LAB — PHOSPHORUS: PHOSPHORUS: 5.5 mg/dL — AB (ref 2.5–4.6)

## 2016-01-08 LAB — URINALYSIS, ROUTINE W REFLEX MICROSCOPIC
BILIRUBIN URINE: NEGATIVE
GLUCOSE, UA: NEGATIVE mg/dL
KETONES UR: NEGATIVE mg/dL
Nitrite: NEGATIVE
PROTEIN: 100 mg/dL — AB
Specific Gravity, Urine: 1.014 (ref 1.005–1.030)
pH: 6 (ref 5.0–8.0)

## 2016-01-08 LAB — VITAMIN B12: Vitamin B-12: 1841 pg/mL — ABNORMAL HIGH (ref 180–914)

## 2016-01-08 LAB — MAGNESIUM: MAGNESIUM: 2.5 mg/dL — AB (ref 1.7–2.4)

## 2016-01-08 LAB — FERRITIN: Ferritin: 362 ng/mL — ABNORMAL HIGH (ref 11–307)

## 2016-01-08 MED ORDER — ATORVASTATIN CALCIUM 40 MG PO TABS
40.0000 mg | ORAL_TABLET | Freq: Every day | ORAL | Status: DC
  Administered 2016-01-09 – 2016-01-12 (×4): 40 mg via ORAL
  Filled 2016-01-08 (×5): qty 1

## 2016-01-08 MED ORDER — MOMETASONE FURO-FORMOTEROL FUM 200-5 MCG/ACT IN AERO
2.0000 | INHALATION_SPRAY | Freq: Two times a day (BID) | RESPIRATORY_TRACT | Status: DC
  Administered 2016-01-09 – 2016-01-13 (×3): 2 via RESPIRATORY_TRACT
  Filled 2016-01-08 (×2): qty 8.8

## 2016-01-08 MED ORDER — ACETAMINOPHEN 325 MG PO TABS: 650.0000 mg | ORAL_TABLET | Freq: Four times a day (QID) | ORAL | Status: DC | PRN

## 2016-01-08 MED ORDER — SODIUM CHLORIDE 0.9 % IV BOLUS (SEPSIS)
500.0000 mL | Freq: Once | INTRAVENOUS | Status: AC
  Administered 2016-01-08: 500 mL via INTRAVENOUS

## 2016-01-08 MED ORDER — AMANTADINE HCL 100 MG PO CAPS
100.0000 mg | ORAL_CAPSULE | Freq: Two times a day (BID) | ORAL | Status: DC
  Administered 2016-01-09 – 2016-01-13 (×9): 100 mg via ORAL
  Filled 2016-01-08 (×11): qty 1

## 2016-01-08 MED ORDER — LEVOTHYROXINE SODIUM 75 MCG PO TABS
75.0000 ug | ORAL_TABLET | Freq: Every day | ORAL | Status: DC
  Administered 2016-01-09 – 2016-01-12 (×4): 75 ug via ORAL
  Filled 2016-01-08 (×5): qty 1

## 2016-01-08 MED ORDER — ARIPIPRAZOLE 5 MG PO TABS
15.0000 mg | ORAL_TABLET | Freq: Two times a day (BID) | ORAL | Status: DC
  Administered 2016-01-09 – 2016-01-13 (×8): 15 mg via ORAL
  Filled 2016-01-08 (×10): qty 3

## 2016-01-08 MED ORDER — DIVALPROEX SODIUM 500 MG PO DR TAB
500.0000 mg | DELAYED_RELEASE_TABLET | Freq: Two times a day (BID) | ORAL | Status: DC
  Administered 2016-01-09 – 2016-01-13 (×8): 500 mg via ORAL
  Filled 2016-01-08 (×8): qty 1

## 2016-01-08 MED ORDER — SODIUM CHLORIDE 0.9 % IV SOLN
INTRAVENOUS | Status: DC
  Administered 2016-01-08: 22:00:00 via INTRAVENOUS

## 2016-01-08 MED ORDER — DIVALPROEX SODIUM 500 MG PO DR TAB
750.0000 mg | DELAYED_RELEASE_TABLET | Freq: Every day | ORAL | Status: DC
  Administered 2016-01-09 – 2016-01-12 (×4): 750 mg via ORAL
  Filled 2016-01-08 (×6): qty 1

## 2016-01-08 MED ORDER — SODIUM CHLORIDE 0.9 % IV SOLN: INTRAVENOUS | Status: AC

## 2016-01-08 MED ORDER — ONDANSETRON HCL 4 MG/2ML IJ SOLN: 4.0000 mg | Freq: Four times a day (QID) | INTRAMUSCULAR | Status: DC | PRN

## 2016-01-08 MED ORDER — ADULT MULTIVITAMIN W/MINERALS CH
1.0000 | ORAL_TABLET | Freq: Every day | ORAL | Status: DC
  Administered 2016-01-09 – 2016-01-13 (×5): 1 via ORAL
  Filled 2016-01-08 (×5): qty 1

## 2016-01-08 MED ORDER — ALBUTEROL SULFATE (2.5 MG/3ML) 0.083% IN NEBU: 3.0000 mL | INHALATION_SOLUTION | Freq: Four times a day (QID) | RESPIRATORY_TRACT | Status: DC | PRN

## 2016-01-08 MED ORDER — ACETAMINOPHEN 650 MG RE SUPP: 650.0000 mg | Freq: Four times a day (QID) | RECTAL | Status: DC | PRN

## 2016-01-08 MED ORDER — ONDANSETRON HCL 4 MG PO TABS: 4.0000 mg | ORAL_TABLET | Freq: Four times a day (QID) | ORAL | Status: DC | PRN

## 2016-01-08 MED ORDER — BENZTROPINE MESYLATE 0.5 MG PO TABS
0.5000 mg | ORAL_TABLET | Freq: Two times a day (BID) | ORAL | Status: DC
  Administered 2016-01-09 – 2016-01-13 (×9): 0.5 mg via ORAL
  Filled 2016-01-08 (×11): qty 1

## 2016-01-08 NOTE — ED Notes (Signed)
Attempted Report 

## 2016-01-08 NOTE — Consult Note (Signed)
Reason for Consult: AKI/CKD Referring Physician: Oleta Mouse, MD  [name] is an 55 y.o. female.  HPI: [name] is a 54 yr female with a past medical history significant for bipolar dz, breast CA, DM , HTN, hypothyroidism, anemia of chronic disease and CKD stage 4 with baseline Scr of around 3-3.4 who was seen in consultation on 12/12/15 by our service due to AKI/CKD, hypernatremia, SOB and confusion. She had been on Ramipril and Meloxicam at time of admission and her Scr improved with cessation of ACE, NSAID as well as IVF's.  She also required one session of HD due to hyperkalemia, however she was deemed unsuited for longterm dialysis.  She now presents from her SNF for abnormal labs, most significant for Scr of 8.88.  We have again been consulted to further evaluate her AKI/CKD.  She is normally followed by Dr. Justin Mend in our office, however she has not been seen since November 2016.  The trend in Scr is seen below.  She is a poor historian and only reports feeling weak.  No history of N/V/D and it does not appear that her ACE or NSAID were restarted after discharge.   Trend in Creatinine: CREATININE, SER  Date/Time Value Ref Range Status  01/08/2016 06:28 PM 8.40* 0.44 - 1.00 mg/dL Final  01/08/2016 06:09 PM 8.88* 0.44 - 1.00 mg/dL Final  12/25/2015 05:10 AM 3.25* 0.44 - 1.00 mg/dL Final  12/24/2015 07:28 AM 3.12* 0.44 - 1.00 mg/dL Final  12/23/2015 08:17 AM 3.23* 0.44 - 1.00 mg/dL Final  12/22/2015 06:19 AM 3.40* 0.44 - 1.00 mg/dL Final  12/21/2015 03:23 AM 3.41* 0.44 - 1.00 mg/dL Final  12/20/2015 05:18 AM 3.15* 0.44 - 1.00 mg/dL Final  12/19/2015 04:15 AM 3.38* 0.44 - 1.00 mg/dL Final  12/18/2015 05:04 AM 3.35* 0.44 - 1.00 mg/dL Final  12/17/2015 04:57 PM 3.09* 0.44 - 1.00 mg/dL Final  12/17/2015 04:55 AM 2.99* 0.44 - 1.00 mg/dL Final  12/16/2015 04:10 AM 3.62* 0.44 - 1.00 mg/dL Final  12/15/2015 05:00 AM 3.88* 0.44 - 1.00 mg/dL Final  12/14/2015 05:50 AM 3.84* 0.44 - 1.00 mg/dL Final   12/13/2015 06:00 PM 5.34* 0.44 - 1.00 mg/dL Final  12/13/2015 05:09 AM 5.21* 0.44 - 1.00 mg/dL Final  12/12/2015 05:12 AM 4.15* 0.44 - 1.00 mg/dL Final  12/11/2015 05:07 AM 3.92* 0.44 - 1.00 mg/dL Final  12/10/2015 05:00 AM 4.11* 0.44 - 1.00 mg/dL Final  12/09/2015 05:16 AM 4.30* 0.44 - 1.00 mg/dL Final  12/08/2015 12:05 PM 4.45* 0.44 - 1.00 mg/dL Final  06/28/2015 03:28 AM 2.28* 0.44 - 1.00 mg/dL Final  04/10/2015 02:10 PM 2.3* 0.6 - 1.1 mg/dL Final  06/07/2013 04:55 AM 1.84* 0.50 - 1.10 mg/dL Final  06/06/2013 02:48 PM 1.88* 0.50 - 1.10 mg/dL Final  06/04/2013 01:45 PM 1.93* 0.50 - 1.10 mg/dL Final  07/19/2011 03:37 PM 1.98* 0.50 - 1.10 mg/dL Final  03/01/2011 04:44 AM 1.54* 0.50 - 1.10 mg/dL Final  02/23/2010 07:26 AM 1.68* 0.4 - 1.2 mg/dL Final  02/22/2010 07:35 AM 1.65* 0.4 - 1.2 mg/dL Final  02/21/2010 05:35 AM 1.94* 0.4 - 1.2 mg/dL Final  02/20/2010 08:15 AM 1.96* 0.4 - 1.2 mg/dL Final  02/19/2010 05:50 AM 1.89* 0.4 - 1.2 mg/dL Final  02/18/2010 05:54 AM 1.81* 0.4 - 1.2 mg/dL Final  02/17/2010 06:00 AM 1.79* 0.4 - 1.2 mg/dL Final  02/16/2010 02:23 PM 1.78* 0.4 - 1.2 mg/dL Final  02/15/2010 05:45 AM 2.05* 0.4 - 1.2 mg/dL Final  02/14/2010 08:18 AM 2.23* 0.4 -  1.2 mg/dL Final  02/13/2010 04:55 AM 2.59* 0.4 - 1.2 mg/dL Final  02/12/2010 10:15 AM 3.17* 0.4 - 1.2 mg/dL Final  10/17/2009 11:51 AM 1.4* 0.4 - 1.2 mg/dL Final  11/10/2007 05:43 PM 1.28*  Final  10/23/2007 05:30 AM 1.25*  Final  10/22/2007 07:21 AM 1.27*  Final  10/21/2007 06:55 AM 0.99  Final  10/18/2007 04:45 AM 1.18  Final  10/17/2007 05:10 AM 1.22*  Final  10/16/2007 06:24 PM 1.16  Final  03/28/2007 11:10 PM 1.13  Final  02/03/2007 06:11 PM 1.3*  Final  01/29/2007 05:56 PM 1.42*  Final    PMH:   Past Medical History  Diagnosis Date  . Anemia 07/10/2011  . Thrombocytopenia ([place]) 07/10/2011  . HTN (hypertension)   . COPD (chronic obstructive pulmonary disease) ([place])   . Hyperlipidemia   . Hypothyroidism    . Bipolar affective ([place])   . DM (diabetes mellitus) ([place])   . Renal insufficiency   . Cancer ([place]) 2012    RIGHT BREAST, RADIATION DONE, NO CHEMO  . Hypertension     PSH:   Past Surgical History  Procedure Laterality Date  . Ankle surgery Left 1989  . Tonsillectomy  AGE 48  . Breast surgery Right 2010    BREAST  . Ventral hernia repair N/A 06/06/2013    Procedure:  OPEN VENTRAL HERNIA REPAIR;  Surgeon: Imogene Burn. Georgette Dover, MD;  Location: [facility];  Service: General;  Laterality: N/A;  . Insertion of mesh N/A 06/06/2013    Procedure: INSERTION OF MESH;  Surgeon: Imogene Burn. Georgette Dover, MD;  Location: [facility];  Service: General;  Laterality: N/A;    Allergies:  Allergies  Allergen Reactions  . Doxycycline Other (See Comments)    REACTION: Unknown reaction  . Glipizide Other (See Comments)    unknown  . Ibuprofen Other (See Comments)    CANNOT TAKE IBUPROFEN OR MOTRIN DUE TO CURRENT MEDS  . Penicillins Other (See Comments)    Has patient had a PCN reaction causing immediate rash, facial/tongue/throat swelling, SOB or lightheadedness with hypotension: unknown Has patient had a PCN reaction causing severe rash involving mucus membranes or skin necrosis: unknown Has patient had a PCN reaction that required hospitalization Unknown Has patient had a PCN reaction occurring within the last 10 years: Unknown If all of the above answers are "NO", then may proceed with Cephalosporin use.   . Sulfonamide Derivatives     REACTION: Unknown reaction  . Tetracycline Other (See Comments)    REACTION: Unknown reaction  . Tolterodine Tartrate Itching  . Chlorostat [Chlorhexidine] Rash  . Cogentin [Benztropine] Rash  . Oxybutynin Chloride Itching and Rash    Medications:   Prior to Admission medications   Medication Sig Start Date End Date Taking? Authorizing Provider  acetaminophen (TYLENOL) 500 MG tablet Take 1 tablet (500 mg total) by mouth every 6 (six) hours as needed. Patient taking  differently: Take 500 mg by mouth every 6 (six) hours as needed for mild pain.  08/16/15  Yes Carlisle Cater, PA-C  albuterol (PROVENTIL HFA;VENTOLIN HFA) 108 (90 BASE) MCG/ACT inhaler Inhale 1-2 puffs into the lungs every 6 (six) hours as needed for wheezing or shortness of breath.   Yes Historical Provider, MD  amantadine (SYMMETREL) 100 MG capsule Take 100 mg by mouth 2 (two) times daily.   Yes Historical Provider, MD  ARIPiprazole (ABILIFY) 15 MG tablet Take 1 tablet (15 mg total) by mouth 2 (two) times daily in the am and at bedtime.. 07/14/15  Yes Benjamine Mola, FNP  atorvastatin (LIPITOR) 40 MG tablet Take 1 tablet (40 mg total) by mouth at bedtime. 07/14/15  Yes Benjamine Mola, FNP  benztropine (COGENTIN) 0.5 MG tablet Take 1 tablet (0.5 mg total) by mouth 2 (two) times daily. 07/14/15  Yes Benjamine Mola, FNP  divalproex (DEPAKOTE) 250 MG DR tablet Take 3 tablets (750 mg total) by mouth at bedtime. 07/14/15  Yes Benjamine Mola, FNP  divalproex (DEPAKOTE) 500 MG DR tablet Take 1 tablet (500 mg total) by mouth 2 (two) times daily at 8am and 3pm. Patient taking differently: Take 1,000 mg by mouth 2 (two) times daily.  07/14/15  Yes Benjamine Mola, FNP  Fluticasone-Salmeterol (ADVAIR) 250-50 MCG/DOSE AEPB Inhale 1 puff into the lungs 2 (two) times daily. 07/14/15  Yes Benjamine Mola, FNP  hydrocortisone cream 1 % Apply topically 3 (three) times daily. 07/14/15  Yes Benjamine Mola, FNP  levothyroxine (SYNTHROID, LEVOTHROID) 75 MCG tablet Take 1 tablet (75 mcg total) by mouth daily before breakfast. 07/14/15  Yes Benjamine Mola, FNP  Multiple Vitamin (MULTIVITAMIN WITH MINERALS) TABS tablet Take 1 tablet by mouth daily. 07/14/15  Yes Benjamine Mola, FNP    Inpatient medications:   Discontinued Meds:  There are no discontinued medications.  Social History:  reports that she has been smoking Cigarettes and Cigars.  She has a 20 pack-year smoking history. She has never used smokeless tobacco. She  reports that she does not drink alcohol or use illicit drugs.  Family History:   Family History  Problem Relation Age of Onset  . Diabetes Mother   . Cancer Father   . HIV Brother     Review of systems not obtained due to patient factors. Weight change:  No intake or output data in the 24 hours ending 01/08/16 2013 BP 125/82 mmHg  Pulse 84  Resp 18  Ht 5\' 3"  (1.6 m)  Wt 88.451 kg (195 lb)  BMI 34.55 kg/m2  SpO2 92% Filed Vitals:   01/08/16 1714 01/08/16 1915 01/08/16 1931 01/08/16 2000  BP:  132/88 132/88 125/82  Pulse:   86 84  TempSrc:      Resp:   18   Height: 5\' 3"  (1.6 m)     Weight: 88.451 kg (195 lb)     SpO2:   97% 92%     General appearance: distracted and no distress Head: Normocephalic, without obvious abnormality, atraumatic Resp: clear to auscultation bilaterally Cardio: regular rate and rhythm, S1, S2 normal, no murmur, click, rub or gallop GI: soft, non-tender; bowel sounds normal; no masses,  no organomegaly Extremities: extremities normal, atraumatic, no cyanosis or edema  Labs: Basic Metabolic Panel:  Recent Labs Lab 01/08/16 1809 01/08/16 1828  NA 141 143  K 4.1 4.0  CL 102 101  CO2 24  --   GLUCOSE 109* 106*  BUN 94* 95*  CREATININE 8.88* 8.40*  ALBUMIN 2.7*  --   CALCIUM 9.7  --   PHOS 5.5*  --    Liver Function Tests:  Recent Labs Lab 01/08/16 1809  AST 48*  ALT 28  ALKPHOS 93  BILITOT 0.8  PROT 6.4*  ALBUMIN 2.7*   No results for input(s): LIPASE, AMYLASE in the last 168 hours. No results for input(s): AMMONIA in the last 168 hours. CBC:  Recent Labs Lab 01/08/16 1809 01/08/16 1828  WBC 6.3  --   NEUTROABS 4.5  --   HGB 9.0* 10.9*  HCT 29.2*  32.0*  MCV 110.6*  --   PLT 99*  --    PT/INR: @LABRCNTIP (inr:5) Cardiac Enzymes: )No results for input(s): CKTOTAL, CKMB, CKMBINDEX, TROPONINI in the last 168 hours. CBG: No results for input(s): GLUCAP in the last 168 hours.  Iron Studies: No results for input(s):  IRON, TIBC, TRANSFERRIN, FERRITIN in the last 168 hours.  Xrays/Other Studies: Dg Chest 2 View  01/08/2016  CLINICAL DATA:  Patient with weakness. EXAM: CHEST  2 VIEW COMPARISON:  Chest radiograph 12/16/2015. FINDINGS: Limited evaluation due to overlapping soft tissue. Stable enlarged cardiac and mediastinal contours. No large area of pulmonary consolidation. No pleural effusion or pneumothorax. IMPRESSION: Limited exam.  No acute cardiopulmonary process. Electronically Signed   By: Lovey Newcomer M.D.   On: 01/08/2016 19:29     Assessment/Plan: 1.  AKI/CKD- pt appears to be volume depleted with marked azotemia.  She is not a suitable dialysis candidate as she had to be intubated and sedated to place a temp HD cath at her last admission (her mental illness would preclude safe dialysis for herself or her providers as well as lack of any family support due to her longstanding mental illness and estranged family members).  Will treat conservatively with IVF's and follow.  No urgent indication for HD at this time.  Will also check FeNa, urine eos, renal US and CK levels. 2. Anemia of chronic disease- with worsening anemia.  Need to r/o GI bleed or acute blood loss.  Transfuse prn.  Will also check iron stores 3. HTN- stable 4. Thrombocytopenia 5. DM- per primary svc 6. 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  Could add binders and keep on renal diet 7. Protein malnutrition- supplement with nepro 8. Disposition- poor overall prognosis.  Pt with limited code.  Continue with conservative management for now.   Governor [place]s Emilynn Srinivasan 01/08/2016, 8:13 PM

## 2016-01-08 NOTE — ED Notes (Signed)
Pt sent to ED via EMS for abnormal labs. Labs were drawn at SNF this Am  And the Crit. 8.30. Pt sent for eval.

## 2016-01-08 NOTE — H&P (Signed)
History and Physical    [name] [id number] DOB: 08/23/1961 DOA: 01/08/2016  PCP: Benito Mccreedy, MD  Patient coming from: Nursing home.  Chief Complaint: Abnormal labs.  HPI: [name] is a 55 y.o. female with medical history significant of recently admitted for acute on chronic renal failure requiring dialysis after patient was intubated since patient cannot make decisions and has received only 1 dialysis session for hyperkalemia was brought to the ER after patient's creatinine was found to be markedly elevated from baseline. Patient's creatinine at discharge was around 3.1 and now it's more than 8. Patient is a poor historian but denies any nausea vomiting or diarrhea. Denies any chest pain or shortness of breath. On-call nephrologist Dr. Kathe Mariner has been counseled and patient has been admitted for further management. As per nephrologist patient is a poor candidate for dialysis and at this time patient looks dehydrated and has been started on gentle hydration.   ED Course: IV fluid bolus was given and started on infusion 50 mL per hour.  Review of Systems: As per HPI otherwise 10 point review of systems negative.    Past Medical History  Diagnosis Date  . Anemia 07/10/2011  . Thrombocytopenia ([place]) 07/10/2011  . HTN (hypertension)   . COPD (chronic obstructive pulmonary disease) ([place])   . Hyperlipidemia   . Hypothyroidism   . Bipolar affective ([place])   . DM (diabetes mellitus) ([place])   . Renal insufficiency   . Cancer ([place]) 2012    RIGHT BREAST, RADIATION DONE, NO CHEMO  . Hypertension     Past Surgical History  Procedure Laterality Date  . Ankle surgery Left 1989  . Tonsillectomy  AGE 25  . Breast surgery Right 2010    BREAST  . Ventral hernia repair N/A 06/06/2013    Procedure:  OPEN VENTRAL HERNIA REPAIR;  Surgeon: Imogene Burn. Georgette Dover, MD;  Location: [facility];  Service: General;  Laterality: N/A;  . Insertion of mesh N/A 06/06/2013    Procedure:  INSERTION OF MESH;  Surgeon: Imogene Burn. Georgette Dover, MD;  Location: [facility];  Service: General;  Laterality: N/A;     reports that she has been smoking Cigarettes and Cigars.  She has a 20 pack-year smoking history. She has never used smokeless tobacco. She reports that she does not drink alcohol or use illicit drugs.  Allergies  Allergen Reactions  . Doxycycline Other (See Comments)    REACTION: Unknown reaction  . Glipizide Other (See Comments)    unknown  . Ibuprofen Other (See Comments)    CANNOT TAKE IBUPROFEN OR MOTRIN DUE TO CURRENT MEDS  . Penicillins Other (See Comments)    Has patient had a PCN reaction causing immediate rash, facial/tongue/throat swelling, SOB or lightheadedness with hypotension: unknown Has patient had a PCN reaction causing severe rash involving mucus membranes or skin necrosis: unknown Has patient had a PCN reaction that required hospitalization Unknown Has patient had a PCN reaction occurring within the last 10 years: Unknown If all of the above answers are "NO", then may proceed with Cephalosporin use.   . Sulfonamide Derivatives     REACTION: Unknown reaction  . Tetracycline Other (See Comments)    REACTION: Unknown reaction  . Tolterodine Tartrate Itching  . Chlorostat [Chlorhexidine] Rash  . Cogentin [Benztropine] Rash  . Oxybutynin Chloride Itching and Rash    Family History  Problem Relation Age of Onset  . Diabetes Mother   . Cancer Father   . HIV Brother  Prior to Admission medications   Medication Sig Start Date End Date Taking? Authorizing Provider  acetaminophen (TYLENOL) 500 MG tablet Take 1 tablet (500 mg total) by mouth every 6 (six) hours as needed. Patient taking differently: Take 500 mg by mouth every 6 (six) hours as needed for mild pain.  08/16/15  Yes Carlisle Cater, PA-C  albuterol (PROVENTIL HFA;VENTOLIN HFA) 108 (90 BASE) MCG/ACT inhaler Inhale 1-2 puffs into the lungs every 6 (six) hours as needed for wheezing or shortness of  breath.   Yes Historical Provider, MD  amantadine (SYMMETREL) 100 MG capsule Take 100 mg by mouth 2 (two) times daily.   Yes Historical Provider, MD  ARIPiprazole (ABILIFY) 15 MG tablet Take 1 tablet (15 mg total) by mouth 2 (two) times daily in the am and at bedtime.. 07/14/15  Yes Benjamine Mola, FNP  atorvastatin (LIPITOR) 40 MG tablet Take 1 tablet (40 mg total) by mouth at bedtime. 07/14/15  Yes Benjamine Mola, FNP  benztropine (COGENTIN) 0.5 MG tablet Take 1 tablet (0.5 mg total) by mouth 2 (two) times daily. 07/14/15  Yes Benjamine Mola, FNP  divalproex (DEPAKOTE) 250 MG DR tablet Take 3 tablets (750 mg total) by mouth at bedtime. 07/14/15  Yes Benjamine Mola, FNP  divalproex (DEPAKOTE) 500 MG DR tablet Take 1 tablet (500 mg total) by mouth 2 (two) times daily at 8am and 3pm. Patient taking differently: Take 1,000 mg by mouth 2 (two) times daily.  07/14/15  Yes Benjamine Mola, FNP  Fluticasone-Salmeterol (ADVAIR) 250-50 MCG/DOSE AEPB Inhale 1 puff into the lungs 2 (two) times daily. 07/14/15  Yes Benjamine Mola, FNP  hydrocortisone cream 1 % Apply topically 3 (three) times daily. 07/14/15  Yes Benjamine Mola, FNP  levothyroxine (SYNTHROID, LEVOTHROID) 75 MCG tablet Take 1 tablet (75 mcg total) by mouth daily before breakfast. 07/14/15  Yes Benjamine Mola, FNP  Multiple Vitamin (MULTIVITAMIN WITH MINERALS) TABS tablet Take 1 tablet by mouth daily. 07/14/15  Yes Benjamine Mola, FNP    Physical Exam: Filed Vitals:   01/08/16 1915 01/08/16 1931 01/08/16 2000 01/08/16 2056  BP: 132/88 132/88 125/82 121/74  Pulse:  86 84 84  Temp:    98.2 F (36.8 C)  TempSrc:    Oral  Resp:  18  18  Height:    5\' 3"  (1.6 m)  Weight:    188 lb 0.8 oz (85.3 kg)  SpO2:  97% 92% 95%      Constitutional: Not in distress. Filed Vitals:   01/08/16 1915 01/08/16 1931 01/08/16 2000 01/08/16 2056  BP: 132/88 132/88 125/82 121/74  Pulse:  86 84 84  Temp:    98.2 F (36.8 C)  TempSrc:    Oral  Resp:  18   18  Height:    5\' 3"  (1.6 m)  Weight:    188 lb 0.8 oz (85.3 kg)  SpO2:  97% 92% 95%   Eyes: Anicteric no pallor. ENMT: No discharge from the ears eyes nose or mouth. Neck: No mass felt. No JVD appreciated. Respiratory: No rhonchi or crepitations. Cardiovascular: S1-S2 heard. Abdomen: Soft nontender bowel sounds present. Musculoskeletal: No edema. Skin: No rash. Neurologic: Alert awake oriented to her name but patient is a poor historian. Moves all extremities. Psychiatric: Poor historian.   Labs on Admission: I have personally reviewed following labs and imaging studies  CBC:  Recent Labs Lab 01/08/16 1809 01/08/16 1828  WBC 6.3  --   NEUTROABS 4.5  --  HGB 9.0* 10.9*  HCT 29.2* 32.0*  MCV 110.6*  --   PLT 99*  --    Basic Metabolic Panel:  Recent Labs Lab 01/08/16 1809 01/08/16 1828  NA 141 143  K 4.1 4.0  CL 102 101  CO2 24  --   GLUCOSE 109* 106*  BUN 94* 95*  CREATININE 8.88* 8.40*  CALCIUM 9.7  --   MG 2.5*  --   PHOS 5.5*  --    GFR: Estimated Creatinine Clearance: 7.7 mL/min (by C-G formula based on Cr of 8.4). Liver Function Tests:  Recent Labs Lab 01/08/16 1809  AST 48*  ALT 28  ALKPHOS 93  BILITOT 0.8  PROT 6.4*  ALBUMIN 2.7*   No results for input(s): LIPASE, AMYLASE in the last 168 hours. No results for input(s): AMMONIA in the last 168 hours. Coagulation Profile: No results for input(s): INR, PROTIME in the last 168 hours. Cardiac Enzymes:  Recent Labs Lab 01/08/16 2141  CKTOTAL 391*   BNP (last 3 results) No results for input(s): PROBNP in the last 8760 hours. HbA1C: No results for input(s): HGBA1C in the last 72 hours. CBG: No results for input(s): GLUCAP in the last 168 hours. Lipid Profile: No results for input(s): CHOL, HDL, LDLCALC, TRIG, CHOLHDL, LDLDIRECT in the last 72 hours. Thyroid Function Tests: No results for input(s): TSH, T4TOTAL, FREET4, T3FREE, THYROIDAB in the last 72 hours. Anemia Panel: No results  for input(s): VITAMINB12, FOLATE, FERRITIN, TIBC, IRON, RETICCTPCT in the last 72 hours. Urine analysis:    Component Value Date/Time   COLORURINE YELLOW 01/08/2016 1934   APPEARANCEUR CLOUDY* 01/08/2016 1934   LABSPEC 1.014 01/08/2016 1934   PHURINE 6.0 01/08/2016 1934   GLUCOSEU NEGATIVE 01/08/2016 1934   HGBUR MODERATE* 01/08/2016 1934   BILIRUBINUR NEGATIVE 01/08/2016 1934   KETONESUR NEGATIVE 01/08/2016 1934   PROTEINUR 100* 01/08/2016 1934   UROBILINOGEN 0.2 06/28/2015 0343   NITRITE NEGATIVE 01/08/2016 1934   LEUKOCYTESUR SMALL* 01/08/2016 1934   Sepsis Labs: @LABRCNTIP (procalcitonin:4,lacticidven:4) )No results found for this or any previous visit (from the past 240 hour(s)).   Radiological Exams on Admission: Dg Chest 2 View  01/08/2016  CLINICAL DATA:  Patient with weakness. EXAM: CHEST  2 VIEW COMPARISON:  Chest radiograph 12/16/2015. FINDINGS: Limited evaluation due to overlapping soft tissue. Stable enlarged cardiac and mediastinal contours. No large area of pulmonary consolidation. No pleural effusion or pneumothorax. IMPRESSION: Limited exam.  No acute cardiopulmonary process. Electronically Signed   By: Lovey Newcomer M.D.   On: 01/08/2016 19:29    EKG: Independently reviewed. Normal sinus rhythm.  Assessment/Plan Principal Problem:   Renal failure (ARF), acute on chronic (HCC) Active Problems:   BIPOLAR AFFECTIVE DISORDER   Anemia in chronic kidney disease   HTN (hypertension)   COPD (chronic obstructive pulmonary disease) (HCC)   Hyperlipidemia   History of breast cancer in female    #1. Acute on chronic renal failure stage 4-5 - appreciate nephrology consult. Patient has had recent admission requiring one session of hemodialysis for hyperkalemia. As per nephrology patient is not a candidate for dialysis given her mental status issues. At this time patient has been placed on gentle hydration as patient looks dehydrated and patient is on normal saline at 50 mL  per hour. Closely follow intake output metabolic panel and check FENa. Further recommendations per nephrology. We'll consult palliative care. #2. Anemia probably from chronic kidney disease - follow CBC. #3. Thrombocytopenia chronic - follow CBC. #4. Hyperlipidemia on statins. #5.  COPD presently not wheezing continue inhalers. #6. Hypothyroidism on Synthroid. #7. Bipolar disorder and schizoaffective disorder - on Abilify and Depakote. Check Depakote levels.   DVT prophylaxis: SCDs patient has thrombocytopenia. Code Status: Full code.  Family Communication: No family at the bedside.  Disposition Plan: Nursing home.  Consults called: Nephrology. Palliative care consult requested. Admission status: Inpatient. Telemetry. Likely stay 3-4 days.    Rise Patience MD [facility] Pager [phone].  If 7PM-7AM, please contact night-coverage [email or web address] Password [facility]  01/08/2016, 10:29 PM

## 2016-01-08 NOTE — Progress Notes (Addendum)
New Admission Note:   Arrival Method: via stretcher from ED Mental Orientation: Alert and Oriented x2, slurred/garbled speech; incomprehensible. Telemetry: Placed on box 6E16, NSR Assessment: Completed Skin: Warm, dry, ecchymosis arms/legs bilaterally, abrasion to Left upper thigh with foam, MSAD to groin/buttocks IV: Right AC Peripheral IV Normal Saline at 68mL/hr Pain: Denies Tubes: N/A Safety Measures: Educated on fall prevention safety plan, patient acknowledged and understood. Admission: Completed [address] Orientation: Patient has been orientated to the room, unit and staff.  Family: N/A  Orders have been reviewed and implemented. Will continue to monitor the patient. Call light has been placed within reach and bed alarm has been activated.   Unable to assess and answer history and admission questions due to patient cognitive and speech is difficult to understand. Will report to next shift if family arrives.    Dorothea Glassman, RN  Phone number: [phone]

## 2016-01-08 NOTE — ED Provider Notes (Signed)
CSN: [id number]     Arrival date & time 01/08/16  1708 History   First MD Initiated Contact with Patient 01/08/16 1711     Chief Complaint  Patient presents with  . Abnormal Lab     (Consider location/radiation/quality/duration/timing/severity/associated sxs/prior Treatment) HPI  Level 5 Caveat, poor historian due to underlying psychiatric illness 55 year old female who presents for abnormal blood work. She is a resident at [facility]. She has a history of schizoaffective disorder, bipolar disorder, hypertension, hyperlipidemia, renal insufficiency, and anemia. She was recently hospitalized in April for acute on chronic kidney failure requiring temporary dialysis. She had routine blood work that was done at [facility] today, and was noted to have a significantly elevated creatinine. She was sent to ED by the physician there. Patient is difficult to understand, and is a poor historian. Says that she has been feeling weak recently. She denies any chest pain, difficulty breathing, significant edema, nausea, vomiting, diarrhea, or abdominal pain.   Past Medical History  Diagnosis Date  . Anemia 07/10/2011  . Thrombocytopenia ([place]) 07/10/2011  . HTN (hypertension)   . COPD (chronic obstructive pulmonary disease) ([place])   . Hyperlipidemia   . Hypothyroidism   . Bipolar affective ([place])   . DM (diabetes mellitus) ([place])   . Renal insufficiency   . 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 Hypertension    Past Surgical History  Procedure Laterality Date  . Ankle surgery Left 1989  . Tonsillectomy  AGE 55  . Breast surgery Right 2010    BREAST  . Ventral hernia repair N/A 06/06/2013    Procedure:  OPEN VENTRAL HERNIA REPAIR;  Surgeon: Imogene Burn. Georgette Dover, MD;  Location: [facility];  Service: General;  Laterality: N/A;  . Insertion of mesh N/A 06/06/2013    Procedure: INSERTION OF MESH;  Surgeon: Imogene Burn. Georgette Dover, MD;  Location: [facility];  Service: General;  Laterality: N/A;    Family History  Problem Relation Age of Onset  . Diabetes Mother   . Cancer Father   . HIV Brother    Social History  Substance Use Topics  . Smoking status: Current Every Day Smoker -- 0.50 packs/day for 40 years    Types: Cigarettes, Cigars  . Smokeless tobacco: Never Used  . Alcohol Use: No   OB History    No data available     Review of Systems  Unable to perform ROS: Psychiatric disorder       Allergies  Doxycycline; Glipizide; Ibuprofen; Penicillins; Sulfonamide derivatives; Tetracycline; Tolterodine tartrate; Chlorostat; Cogentin; and Oxybutynin chloride  Home Medications   Prior to Admission medications   Medication Sig Start Date End Date Taking? Authorizing Provider  acetaminophen (TYLENOL) 500 MG tablet Take 1 tablet (500 mg total) by mouth every 6 (six) hours as needed. Patient taking differently: Take 500 mg by mouth every 6 (six) hours as needed for mild pain.  08/16/15  Yes Carlisle Cater, PA-C  albuterol (PROVENTIL HFA;VENTOLIN HFA) 108 (90 BASE) MCG/ACT inhaler Inhale 1-2 puffs into the lungs every 6 (six) hours as needed for wheezing or shortness of breath.   Yes Historical Provider, MD  amantadine (SYMMETREL) 100 MG capsule Take 100 mg by mouth 2 (two) times daily.   Yes Historical Provider, MD  ARIPiprazole (ABILIFY) 15 MG tablet Take 1 tablet (15 mg total) by mouth 2 (two) times daily in the am and at bedtime.. 07/14/15  Yes Benjamine Mola, FNP  atorvastatin (LIPITOR) 40 MG  tablet Take 1 tablet (40 mg total) by mouth at bedtime. 07/14/15  Yes Benjamine Mola, FNP  benztropine (COGENTIN) 0.5 MG tablet Take 1 tablet (0.5 mg total) by mouth 2 (two) times daily. 07/14/15  Yes Benjamine Mola, FNP  divalproex (DEPAKOTE) 250 MG DR tablet Take 3 tablets (750 mg total) by mouth at bedtime. 07/14/15  Yes Benjamine Mola, FNP  divalproex (DEPAKOTE) 500 MG DR tablet Take 1 tablet (500 mg total) by mouth 2 (two) times daily at 8am and 3pm. Patient taking differently:  Take 1,000 mg by mouth 2 (two) times daily.  07/14/15  Yes Benjamine Mola, FNP  Fluticasone-Salmeterol (ADVAIR) 250-50 MCG/DOSE AEPB Inhale 1 puff into the lungs 2 (two) times daily. 07/14/15  Yes Benjamine Mola, FNP  hydrocortisone cream 1 % Apply topically 3 (three) times daily. 07/14/15  Yes Benjamine Mola, FNP  levothyroxine (SYNTHROID, LEVOTHROID) 75 MCG tablet Take 1 tablet (75 mcg total) by mouth daily before breakfast. 07/14/15  Yes Benjamine Mola, FNP  Multiple Vitamin (MULTIVITAMIN WITH MINERALS) TABS tablet Take 1 tablet by mouth daily. 07/14/15  Yes John C Withrow, FNP   BP 132/88 mmHg  Pulse 86  Resp 18  Ht 5\' 3"  (1.6 m)  Wt 195 lb (88.451 kg)  BMI 34.55 kg/m2  SpO2 97% Physical Exam Physical Exam  Nursing note and vitals reviewed. Constitutional: chronically ill appearing, non-toxic, and in no acute distress Head: Normocephalic and atraumatic.  Mouth/Throat: Oropharynx is clear and mucous membranes dry.  Neck: Normal range of motion. Neck supple.  Cardiovascular: Normal rate and regular rhythm.  trace edema in LLE Pulmonary/Chest: Effort normal and breath sounds normal.  Abdominal: Soft. Obese. There is no tenderness. There is no rebound and no guarding.  Musculoskeletal: Normal range of motion.  Neurological: Alert, no facial droop, fluent speech, moves all extremities symmetrically Skin: Skin is warm and dry.  Psychiatric: Cooperative  ED Course  Procedures (including critical care time) Labs Review Labs Reviewed  CBC WITH DIFFERENTIAL/PLATELET - Abnormal; Notable for the following:    RBC 2.64 (*)    Hemoglobin 9.0 (*)    HCT 29.2 (*)    MCV 110.6 (*)    MCH 34.1 (*)    RDW 19.6 (*)    Platelets 99 (*)    All other components within normal limits  COMPREHENSIVE METABOLIC PANEL - Abnormal; Notable for the following:    Glucose, Bld 109 (*)    BUN 94 (*)    Creatinine, Ser 8.88 (*)    Total Protein 6.4 (*)    Albumin 2.7 (*)    AST 48 (*)    GFR calc non  Af Amer 4 (*)    GFR calc Af Amer 5 (*)    All other components within normal limits  MAGNESIUM - Abnormal; Notable for the following:    Magnesium 2.5 (*)    All other components within normal limits  PHOSPHORUS - Abnormal; Notable for the following:    Phosphorus 5.5 (*)    All other components within normal limits  I-STAT CHEM 8, ED - Abnormal; Notable for the following:    BUN 95 (*)    Creatinine, Ser 8.40 (*)    Glucose, Bld 106 (*)    Calcium, Ion 1.26 (*)    Hemoglobin 10.9 (*)    HCT 32.0 (*)    All other components within normal limits  URINALYSIS, ROUTINE W REFLEX MICROSCOPIC (NOT AT [facility])    Imaging  Review Dg Chest 2 View  01/08/2016  CLINICAL DATA:  Patient with weakness. EXAM: CHEST  2 VIEW COMPARISON:  Chest radiograph 12/16/2015. FINDINGS: Limited evaluation due to overlapping soft tissue. Stable enlarged cardiac and mediastinal contours. No large area of pulmonary consolidation. No pleural effusion or pneumothorax. IMPRESSION: Limited exam.  No acute cardiopulmonary process. Electronically Signed   By: Lovey Newcomer M.D.   On: 01/08/2016 19:29   I have personally reviewed and evaluated these images and lab results as part of my medical decision-making.   EKG Interpretation   Date/Time:  Thursday Jan 08 2016 18:18:06 EDT Ventricular Rate:  82 PR Interval:  164 QRS Duration: 110 QT Interval:  355 QTC Calculation: 415 R Axis:   26 Text Interpretation:  Sinus rhythm No significant change since last  tracing Confirmed by Ericah Scotto MD, Daryn Hicks [phone]) on 01/08/2016 6:54:16 PM      MDM   Final diagnoses:  Acute renal failure, unspecified acute renal failure type [facility])    55 year old female who presents from [facility] with worsening renal function. She is nontoxic in no acute distress. History limited by her underlying psychiatric illness. Her vital signs are within normal limits. She does not appear significantly fluid overloaded on exam, but does  appear to have dry mucous membranes. Received 500 mL of IV fluids. No emergent need for dialysis in the setting of her creatinine of 8.88 today. She has a normal potassium and no significant fluid overload. Discussed with Dr. Marval Regal who will consult. Admitted to [facility] hosptialist to telemetry for ongoing management.    Forde Dandy, MD 01/08/16 Joen Laura

## 2016-01-09 LAB — RENAL FUNCTION PANEL
ALBUMIN: 2.3 g/dL — AB (ref 3.5–5.0)
Anion gap: 13 (ref 5–15)
BUN: 88 mg/dL — AB (ref 6–20)
CO2: 26 mmol/L (ref 22–32)
Calcium: 9.6 mg/dL (ref 8.9–10.3)
Chloride: 106 mmol/L (ref 101–111)
Creatinine, Ser: 7.79 mg/dL — ABNORMAL HIGH (ref 0.44–1.00)
GFR calc Af Amer: 6 mL/min — ABNORMAL LOW (ref >60)
GFR calc non Af Amer: 5 mL/min — ABNORMAL LOW (ref >60)
GLUCOSE: 77 mg/dL (ref 65–99)
PHOSPHORUS: 5 mg/dL — AB (ref 2.5–4.6)
POTASSIUM: 3.8 mmol/L (ref 3.5–5.1)
Sodium: 145 mmol/L (ref 135–145)

## 2016-01-09 LAB — MRSA PCR SCREENING: MRSA by PCR: POSITIVE — AB

## 2016-01-09 LAB — VALPROIC ACID LEVEL: Valproic Acid Lvl: <10 ug/mL — ABNORMAL LOW (ref 50.0–100.0)

## 2016-01-09 MED ORDER — HALOPERIDOL LACTATE 5 MG/ML IJ SOLN
1.0000 mg | Freq: Once | INTRAMUSCULAR | Status: AC
  Administered 2016-01-09: 1 mg via INTRAMUSCULAR
  Filled 2016-01-09: qty 1

## 2016-01-09 MED ORDER — LORAZEPAM 2 MG/ML IJ SOLN
0.5000 mg | Freq: Once | INTRAMUSCULAR | Status: AC
  Administered 2016-01-09: 0.5 mg via INTRAVENOUS
  Filled 2016-01-09: qty 1

## 2016-01-09 MED ORDER — NEPRO/CARBSTEADY PO LIQD
237.0000 mL | Freq: Two times a day (BID) | ORAL | Status: DC
  Administered 2016-01-11 – 2016-01-13 (×2): 237 mL via ORAL

## 2016-01-09 MED ORDER — LORAZEPAM 2 MG/ML IJ SOLN: 0.5000 mg | Freq: Four times a day (QID) | INTRAMUSCULAR | Status: DC | PRN

## 2016-01-09 MED ORDER — CHLORHEXIDINE GLUCONATE CLOTH 2 % EX PADS
6.0000 | MEDICATED_PAD | Freq: Every day | CUTANEOUS | Status: AC
  Administered 2016-01-09 – 2016-01-13 (×5): 6 via TOPICAL

## 2016-01-09 MED ORDER — MUPIROCIN 2 % EX OINT
1.0000 "application " | TOPICAL_OINTMENT | Freq: Two times a day (BID) | CUTANEOUS | Status: DC
  Administered 2016-01-09 – 2016-01-13 (×8): 1 via NASAL
  Filled 2016-01-09 (×2): qty 22

## 2016-01-09 MED ORDER — CIPROFLOXACIN IN D5W 200 MG/100ML IV SOLN: 200.0000 mg | Freq: Two times a day (BID) | INTRAVENOUS | Status: DC

## 2016-01-09 NOTE — Progress Notes (Signed)
Pt very restless and continues to attempt to get out of bed. Pt is high falls risk. Moved pt to nurses station and pt is still trying to get out of chair and pulling at IV. Mittens put on pt for safety. Notified MD. Orders received. Will continue to monitor.

## 2016-01-09 NOTE — Progress Notes (Signed)
Initial Nutrition Assessment  DOCUMENTATION CODES:   Obesity unspecified  INTERVENTION:  -Nepro BID. Each supplement provides 425 kcals and 19 g of protein.  NUTRITION DIAGNOSIS:   Inadequate oral intake related to lethargy/confusion as evidenced by per patient/family report.  GOAL:   Patient will meet greater than or equal to 90% of their needs  MONITOR:   PO intake, Supplement acceptance, Labs, Weight trends, Skin, I & O's  REASON FOR ASSESSMENT:   Malnutrition Screening Tool    ASSESSMENT:   pt with medical history significant of recently admitted for acute on chronic renal failure requiring dialysis after patient was intubated since patient cannot make decisions and has received only 1 dialysis session for hyperkalemia was brought to the ER after patient's creatinine was found to be markedly elevated from baseline. Patient's creatinine at discharge was around 3.1 and now it's more than 8. Patient is a poor historian but denies any nausea vomiting or diarrhea. Denies any chest pain or shortness of breath. On-call nephrologist Dr. Kathe Mariner has been counseled and patient has been admitted for further management. As per nephrologist patient is a poor candidate for dialysis and at this time patient looks dehydrated and has been started on gentle hydration.   Pt confused and agitated at time of visit. Pt not able to communicate. Spoke with RN who reports pt has poor intake. Per chart, PO for breakfast today was 65%.  Pt has experienced a 13.7% weight loss in <6 months which is significant for time frame.  Suspect malnutrition but unable to obtain information about intake prior to hospital stay. Will attempt to gain more information at follow up.   Will order Nepro to supplement diet.   NFPE: No muscle depletion, mild fat depletion, no edema. Difficult to obtain accurate conclusions as pt was agitated with exam. If able, will assess more thoroughly at follow up.   Labs reviewed;  phos 5.0, BUN 88, creat 7.79, GFR 5, CBGs 44-164.  Meds reviewed; MVI w/ minerals.    Diet Order:  Diet renal with fluid restriction Fluid restriction:: 1200 mL Fluid; Room service appropriate?: Yes; Fluid consistency:: Thin  Skin:  Reviewed, no issues  Last BM:  unknown  Height:   Ht Readings from Last 1 Encounters:  01/08/16 5\' 3"  (1.6 m)    Weight:   Wt Readings from Last 1 Encounters:  01/08/16 188 lb 0.8 oz (85.3 kg)    Ideal Body Weight:  52.3 kg  BMI:  Body mass index is 33.32 kg/(m^2).  Estimated Nutritional Needs:   Kcal:  1600-1800  Protein:  75-85 g  Fluid:  1.2 L  EDUCATION NEEDS:   No education needs identified at this time  Geoffery Lyons, [place] Dietetic Intern Pager [phone]

## 2016-01-09 NOTE — Progress Notes (Signed)
[facility] Rounding Note  Subjective:  Pt is in a chair at the nursing station - babbling incoherently, in mittens and trying to climb out of her chair Getting NS at 50   Objective Vital signs in last 24 hours: Filed Vitals:   01/08/16 2056 01/09/16 0653 01/09/16 0844 01/09/16 1000  BP: 121/74 118/63  125/77  Pulse: 84 84 85 90  Temp: 98.2 F (36.8 C) 98.6 F (37 C)  97.8 F (36.6 C)  TempSrc: Oral Oral  Oral  Resp: 18  18 18   Height: 5\' 3"  (1.6 m)     Weight: 85.3 kg (188 lb 0.8 oz)     SpO2: 95% 97% 97% 98%   Weight change:   Intake/Output Summary (Last 24 hours) at 01/09/16 1523 Last data filed at 01/09/16 0900  Gross per 24 hour  Intake    240 ml  Output      0 ml  Net    240 ml    Physical Exam:  Blood pressure 125/77, pulse 90, temperature 97.8 F (36.6 C), temperature source Oral, resp. rate 18, height 5\' 3"  (1.6 m), weight 85.3 kg (188 lb 0.8 oz), SpO2 98 %. General appearance: She is at the nurses station, in mittens, trying to climb out of her chair, cannot converse - babbling incoherently Getting IVF at 50 Lungs clear Cardio: S1, S2 normal, no murmur GI: Cannot examine at nurses station Extremities: No edema    Recent Labs Lab 01/08/16 1809 01/08/16 1828 01/09/16 0528  NA 141 143 145  K 4.1 4.0 3.8  CL 102 101 106  CO2 24  --  26  GLUCOSE 109* 106* 77  BUN 94* 95* 88*  CREATININE 8.88* 8.40* 7.79*  CALCIUM 9.7  --  9.6  PHOS 5.5*  --  5.0*     Recent Labs Lab 01/08/16 1809 01/09/16 0528  AST 48*  --   ALT 28  --   ALKPHOS 93  --   BILITOT 0.8  --   PROT 6.4*  --   ALBUMIN 2.7* 2.3*     Recent Labs Lab 01/08/16 1809 01/08/16 1828  WBC 6.3  --   NEUTROABS 4.5  --   HGB 9.0* 10.9*  HCT 29.2* 32.0*  MCV 110.6*  --   PLT 99*  --      Recent Labs Lab 01/08/16 2141  CKTOTAL 391*    Iron Studies:  Recent Labs Lab 01/08/16 2141  IRON 31  TIBC 178*  FERRITIN 362*  TSat 17  Studies/Results: Dg Chest 2 View  01/08/2016   CLINICAL DATA:  Patient with weakness. EXAM: CHEST  2 VIEW COMPARISON:  Chest radiograph 12/16/2015. FINDINGS: Limited evaluation due to overlapping soft tissue. Stable enlarged cardiac and mediastinal contours. No large area of pulmonary consolidation. No pleural effusion or pneumothorax. IMPRESSION: Limited exam.  No acute cardiopulmonary process. Electronically Signed   By: Lovey Newcomer M.D.   On: 01/08/2016 19:29   Medications: . sodium chloride     . amantadine  100 mg Oral BID  . ARIPiprazole  15 mg Oral BID  . atorvastatin  40 mg Oral QHS  . benztropine  0.5 mg Oral BID  . Chlorhexidine Gluconate Cloth  6 each Topical Q0600  . divalproex  500 mg Oral BID  . divalproex  750 mg Oral QHS  . feeding supplement (NEPRO CARB STEADY)  237 mL Oral BID BM  . levothyroxine  75 mcg Oral QAC breakfast  . mometasone-formoterol  2 puff  Inhalation BID  . multivitamin with minerals  1 tablet Oral Daily  . mupirocin ointment  1 application Nasal BID   Background 55 yr female with a past medical history significant for bipolar dz, breast CA, DM , HTN, hypothyroidism, anemia of chronic disease and CKD stage 4 with baseline Scr of around 3-3.4 who was seen in consultation on 12/12/15 by our service due to AKI/CKD, hypernatremia, SOB and confusion. She had been on Ramipril and Meloxicam at time of that admission and her Scr improved with cessation of ACE, NSAID as well as IVF's. She also required one session of HD due to hyperkalemia, however she was deemed unsuited for longterm dialysis. She now presents from her SNF for abnormal labs, most significant for Scr of 8.88. We were again been consulted to further evaluate AKI/CKD. She is normally followed by Dr. Justin Mend in our office, however has not been seen since November 2016. She is a poor historian and only reports feeling weak. No history of N/V/D and it does not appear that her ACE or NSAID were restarted after discharge.   Creatinine trend for reference  leading to consultation 01/08/2016 06:28 PM 8.40* 0.44 - 1.00 mg/dL Final  01/08/2016 06:09 PM 8.88* 0.44 - 1.00 mg/dL Final  12/25/2015 05:10 AM 3.25* 0.44 - 1.00 mg/dL Final  12/24/2015 07:28 AM 3.12* 0.44 - 1.00 mg/dL Final       Assessment/Plan: 1. AKI/CKD- pt appeared volume depleted with marked azotemia at time of consultation. She is not a suitable dialysis candidate as she had to be intubated and sedated to place a temp HD cath at her last admission (her mental illness would preclude safe dialysis for herself or her providers as well as lack of any family support due to her longstanding mental illness and estranged family members). Recommended treating conservatively with IVF's (getting NS at 50/hour and creatinine some improved). Urine not follected for FeNa,  no renal US done yet (not ordered? But I don't think needs), CPK 391 (not of rhabdo proportions) and I can't tell if urine eos sent or not. Remains without  urgent indication for HD at this time.First time I have seen her - NOT a dialysis candidate acutely or chronically from what I am seeing.  2. Anemia of chronic disease- Hb up from 9.0->10.9 but appears to be istat so prob not acurate. Transfuse prn.TSat 17, po Fe fine.  3. HTN- stable 4. Thrombocytopenia 5. DM- per primary svc 6. 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 Could add binders and keep on renal diet 7. Protein malnutrition- supplement with nepro 8. Disposition- poor overall prognosis. Pt with limited code. Continue with conservative management for now.   Jamal Maes, MD [facility] [phone] pager 01/09/2016, 3:23 PM

## 2016-01-09 NOTE — Progress Notes (Signed)
PROGRESS NOTE    [name]  [id number] DOB: 19-Nov-1960 DOA: 01/08/2016 PCP: Benito Mccreedy, MD (Confirm with patient/family/NH records and if not entered, this HAS to be entered at [facility] point of entry. "No PCP" if truly none.) Outpatient Specialists: Theme park manager speciality and name if known)    Brief Narrative: (Start on day 1 of progress note - keep it brief and live) Admitted for renal failure, not candidate for hemodialysis.   Assessment & Plan:   Principal Problem:   Renal failure (ARF), acute on chronic (HCC) Active Problems:   BIPOLAR AFFECTIVE DISORDER   Anemia in chronic kidney disease   HTN (hypertension)   COPD (chronic obstructive pulmonary disease) (HCC)   Hyperlipidemia   History of breast cancer in female   1. Cardiovascular. No signs of volume overload, will continue with gentle hydration, no signs of systemic infection, will continue atorvastatin. Patient off antihypertensive meds, blood pressure systolic [id number] to [id number].  2. Pulmonary. No signs of pulmonary edema or volume overload, will continue to monitor oxymetry, supplemental 02 per [employer] as tolerated to target 02 sat above 92%  3. Nephrology. Urine out put difficult to document due to patient incontinent, will continue gentle hydration with  Saline at 50% cc/hr, Na at 145 with K at 3.8 and cl at 106. Follow renal panel in am and follow on nephrology recommendations, patient not candidate for HD due to mentation.  4. Neurology. Metabolic encephalopathy plus bipolar, will continue supportive care, added haldol for agitation, will continue amantadine, ariprazole, benztropine, divalproex, will avoid benzodiazepines.  5. Endocrinology continue on levothyroxine.    DVT prophylaxis: (Lovenox/Heparin/SCD's/anticoagulated/None (if comfort care) Code Status: (Full/Partial - specify details) Family Communication: (Specify name, relationship & date discussed. NO "discussed with patient") Disposition Plan:  (specify when and where you expect patient to be discharged)   Consultants:     Procedures: (Don't include imaging studies which can be auto populated. Include things that cannot be auto populated i.e. Echo, Carotid and venous dopplers, Foley, Bipap, HD, tubes/drains, wound vac, central lines etc)   Antimicrobials: (specify start and planned stop date. Auto populated tables are space occupying and do not give end dates)    Subjective: Patient confused and not communicative, not following commands, unable to give any history, all information from nurse at the bedside. Note patient has been incontinent.  Objective: Filed Vitals:   01/08/16 2056 01/09/16 0653 01/09/16 0844 01/09/16 1000  BP: 121/74 118/63  125/77  Pulse: 84 84 85 90  Temp: 98.2 F (36.8 C) 98.6 F (37 C)  97.8 F (36.6 C)  TempSrc: Oral Oral  Oral  Resp: 18  18 18   Height: 5\' 3"  (1.6 m)     Weight: 85.3 kg (188 lb 0.8 oz)     SpO2: 95% 97% 97% 98%    Intake/Output Summary (Last 24 hours) at 01/09/16 1113 Last data filed at 01/09/16 0900  Gross per 24 hour  Intake    240 ml  Output      0 ml  Net    240 ml   Filed Weights   01/08/16 1714 01/08/16 2056  Weight: 88.451 kg (195 lb) 85.3 kg (188 lb 0.8 oz)    Examination:  General exam: deconditioned EENT: Oral mucosa dry, mild conjunctival pallor. Respiratory system:. Mild bibasilar rales, no wheezing or rhonchi. Decreased breath sounds at bases. Poor inspiratory effort/ Cardiovascular system: S1 & S2 heard, RRR. No JVD, no murmurs, no rubs, gallops or clicks. No pedal edema. Gastrointestinal system: Abdomen  is nondistended, soft and nontender. No organomegaly or masses felt. Normal bowel sounds heard. Central nervous system: Alert and oriented. No focal neurological deficits. Extremities: Symmetric 5 x 5 power. Skin: No rashes, lesions or ulcers Psychiatry: unable to assess.  Data Reviewed: I have personally reviewed following labs and imaging  studies  CBC:  Recent Labs Lab 01/08/16 1809 01/08/16 1828  WBC 6.3  --   NEUTROABS 4.5  --   HGB 9.0* 10.9*  HCT 29.2* 32.0*  MCV 110.6*  --   PLT 99*  --    Basic Metabolic Panel:  Recent Labs Lab 01/08/16 1809 01/08/16 1828 01/09/16 0528  NA 141 143 145  K 4.1 4.0 3.8  CL 102 101 106  CO2 24  --  26  GLUCOSE 109* 106* 77  BUN 94* 95* 88*  CREATININE 8.88* 8.40* 7.79*  CALCIUM 9.7  --  9.6  MG 2.5*  --   --   PHOS 5.5*  --  5.0*   GFR: Estimated Creatinine Clearance: 8.3 mL/min (by C-G formula based on Cr of 7.79). Liver Function Tests:  Recent Labs Lab 01/08/16 1809 01/09/16 0528  AST 48*  --   ALT 28  --   ALKPHOS 93  --   BILITOT 0.8  --   PROT 6.4*  --   ALBUMIN 2.7* 2.3*   No results for input(s): LIPASE, AMYLASE in the last 168 hours. No results for input(s): AMMONIA in the last 168 hours. Coagulation Profile: No results for input(s): INR, PROTIME in the last 168 hours. Cardiac Enzymes:  Recent Labs Lab 01/08/16 2141  CKTOTAL 391*   BNP (last 3 results) No results for input(s): PROBNP in the last 8760 hours. HbA1C: No results for input(s): HGBA1C in the last 72 hours. CBG: No results for input(s): GLUCAP in the last 168 hours. Lipid Profile: No results for input(s): CHOL, HDL, LDLCALC, TRIG, CHOLHDL, LDLDIRECT in the last 72 hours. Thyroid Function Tests: No results for input(s): TSH, T4TOTAL, FREET4, T3FREE, THYROIDAB in the last 72 hours. Anemia Panel:  Recent Labs  01/08/16 2141  VITAMINB12 1841*  FERRITIN 362*  TIBC 178*  IRON 31   Urine analysis:    Component Value Date/Time   COLORURINE YELLOW 01/08/2016 1934   APPEARANCEUR CLOUDY* 01/08/2016 1934   LABSPEC 1.014 01/08/2016 1934   PHURINE 6.0 01/08/2016 1934   GLUCOSEU NEGATIVE 01/08/2016 1934   HGBUR MODERATE* 01/08/2016 1934   BILIRUBINUR NEGATIVE 01/08/2016 1934   KETONESUR NEGATIVE 01/08/2016 1934   PROTEINUR 100* 01/08/2016 1934   UROBILINOGEN 0.2 06/28/2015  0343   NITRITE NEGATIVE 01/08/2016 1934   LEUKOCYTESUR SMALL* 01/08/2016 1934   Sepsis Labs: No results for input(s): PROCALCITON, LATICACIDVEN in the last 168 hours.  Recent Results (from the past 240 hour(s))  MRSA PCR Screening     Status: Abnormal   Collection Time: 01/08/16 10:03 PM  Result Value Ref Range Status   MRSA by PCR POSITIVE (A) NEGATIVE Final    Comment:        The GeneXpert MRSA Assay (FDA approved for NASAL specimens only), is one component of a comprehensive MRSA colonization surveillance program. It is not intended to diagnose MRSA infection nor to guide or monitor treatment for MRSA infections. RESULT CALLED TO, READ BACK BY AND VERIFIED WITHBarnabas Harries RN [id number] 01/09/16 MITCHELL,L          Radiology Studies: Dg Chest 2 View  01/08/2016  CLINICAL DATA:  Patient with weakness. EXAM: CHEST  2 VIEW COMPARISON:  Chest radiograph 12/16/2015. FINDINGS: Limited evaluation due to overlapping soft tissue. Stable enlarged cardiac and mediastinal contours. No large area of pulmonary consolidation. No pleural effusion or pneumothorax. IMPRESSION: Limited exam.  No acute cardiopulmonary process. Electronically Signed   By: Lovey Newcomer M.D.   On: 01/08/2016 19:29        Scheduled Meds: . amantadine  100 mg Oral BID  . ARIPiprazole  15 mg Oral BID  . atorvastatin  40 mg Oral QHS  . benztropine  0.5 mg Oral BID  . Chlorhexidine Gluconate Cloth  6 each Topical Q0600  . divalproex  500 mg Oral BID  . divalproex  750 mg Oral QHS  . levothyroxine  75 mcg Oral QAC breakfast  . mometasone-formoterol  2 puff Inhalation BID  . multivitamin with minerals  1 tablet Oral Daily  . mupirocin ointment  1 application Nasal BID   Continuous Infusions: . sodium chloride       LOS: 1 day        Mauricio Gerome Apley, MD [facility] Pager [phone]  If 7PM-7AM, please contact night-coverage [email or web address] Password [email or web address] 01/09/2016, 11:13 AM

## 2016-01-09 NOTE — Progress Notes (Addendum)
Utilization review completed. In EPIC and Lynett Grimes RN, BSN, CM

## 2016-01-10 LAB — RENAL FUNCTION PANEL
ALBUMIN: 2.4 g/dL — AB (ref 3.5–5.0)
ANION GAP: 13 (ref 5–15)
BUN: 80 mg/dL — ABNORMAL HIGH (ref 6–20)
CALCIUM: 10.1 mg/dL (ref 8.9–10.3)
CO2: 26 mmol/L (ref 22–32)
Chloride: 110 mmol/L (ref 101–111)
Creatinine, Ser: 6.69 mg/dL — ABNORMAL HIGH (ref 0.44–1.00)
GFR, EST AFRICAN AMERICAN: 7 mL/min — AB (ref >60)
GFR, EST NON AFRICAN AMERICAN: 6 mL/min — AB (ref >60)
GLUCOSE: 78 mg/dL (ref 65–99)
PHOSPHORUS: 5.3 mg/dL — AB (ref 2.5–4.6)
Potassium: 4.1 mmol/L (ref 3.5–5.1)
SODIUM: 149 mmol/L — AB (ref 135–145)

## 2016-01-10 LAB — CBC WITH DIFFERENTIAL/PLATELET
BASOS ABS: 0 10*3/uL (ref 0.0–0.1)
BASOS PCT: 0 %
EOS ABS: 0.3 10*3/uL (ref 0.0–0.7)
Eosinophils Relative: 4 %
HCT: 31.8 % — ABNORMAL LOW (ref 36.0–46.0)
HEMOGLOBIN: 9.6 g/dL — AB (ref 12.0–15.0)
Lymphocytes Relative: 24 %
Lymphs Abs: 1.5 10*3/uL (ref 0.7–4.0)
MCH: 34.4 pg — ABNORMAL HIGH (ref 26.0–34.0)
MCHC: 30.2 g/dL (ref 30.0–36.0)
MCV: 114 fL — ABNORMAL HIGH (ref 78.0–100.0)
Monocytes Absolute: 0.3 10*3/uL (ref 0.1–1.0)
Monocytes Relative: 5 %
NEUTROS PCT: 66 %
Neutro Abs: 4 10*3/uL (ref 1.7–7.7)
Platelets: 90 10*3/uL — ABNORMAL LOW (ref 150–400)
RBC: 2.79 MIL/uL — AB (ref 3.87–5.11)
RDW: 19.7 % — ABNORMAL HIGH (ref 11.5–15.5)
WBC: 6.1 10*3/uL (ref 4.0–10.5)

## 2016-01-10 MED ORDER — LORAZEPAM 2 MG/ML IJ SOLN
1.0000 mg | Freq: Once | INTRAMUSCULAR | Status: AC
  Administered 2016-01-11: 1 mg via INTRAVENOUS
  Filled 2016-01-10: qty 1

## 2016-01-10 MED ORDER — DEXTROSE-NACL 5-0.45 % IV SOLN: INTRAVENOUS | Status: DC

## 2016-01-10 MED ORDER — HEPARIN SODIUM (PORCINE) 5000 UNIT/ML IJ SOLN
5000.0000 [IU] | Freq: Three times a day (TID) | INTRAMUSCULAR | Status: DC
  Administered 2016-01-10 – 2016-01-13 (×6): 5000 [IU] via SUBCUTANEOUS
  Filled 2016-01-10 (×5): qty 1

## 2016-01-10 NOTE — Progress Notes (Signed)
[facility] Rounding Note  Subjective:  Remains confused, in mittens and restrained  Objective Vital signs in last 24 hours: Filed Vitals:   01/09/16 1806 01/09/16 2055 01/10/16 0440 01/10/16 0856  BP: 120/75 141/93 99/66 139/79  Pulse: 85 87 93 92  Temp: 98.3 F (36.8 C) 98.1 F (36.7 C) 98.2 F (36.8 C) 98.1 F (36.7 C)  TempSrc: Oral Oral Oral Oral  Resp: 18 18 19 18   Height:      Weight:      SpO2: 99% 99% 100% 96%   Weight change:   Intake/Output Summary (Last 24 hours) at 01/10/16 1208 Last data filed at 01/10/16 1000  Gross per 24 hour  Intake    240 ml  Output      0 ml  Net    240 ml    Physical Exam:  Blood pressure 125/77, pulse 90, temperature 97.8 F (36.6 C), temperature source Oral, resp. rate 18, height 5\' 3"  (1.6 m), weight 85.3 kg (188 lb 0.8 oz), SpO2 98 %. General appearance: Confused, mittened, waving, and talking to the wall Getting IVF at 50 Lungs clear Cardio: S1, S2 normal, no murmur Abd soft - does not like being examined Extremities: No edema Hands in mittens   Recent Labs Lab 01/08/16 1809 01/08/16 1828 01/09/16 0528 01/10/16 0643  NA 141 143 145 149*  K 4.1 4.0 3.8 4.1  CL 102 101 106 110  CO2 24  --  26 26  GLUCOSE 109* 106* 77 78  BUN 94* 95* 88* 80*  CREATININE 8.88* 8.40* 7.79* 6.69*  CALCIUM 9.7  --  9.6 10.1  PHOS 5.5*  --  5.0* 5.3*     Recent Labs Lab 01/08/16 1809 01/09/16 0528 01/10/16 0643  AST 48*  --   --   ALT 28  --   --   ALKPHOS 93  --   --   BILITOT 0.8  --   --   PROT 6.4*  --   --   ALBUMIN 2.7* 2.3* 2.4*     Recent Labs Lab 01/08/16 1809 01/08/16 1828 01/10/16 0643  WBC 6.3  --  6.1  NEUTROABS 4.5  --  4.0  HGB 9.0* 10.9* 9.6*  HCT 29.2* 32.0* 31.8*  MCV 110.6*  --  114.0*  PLT 99*  --  90*     Recent Labs Lab 01/08/16 2141  CKTOTAL 391*      Recent Labs Lab 01/08/16 2141  IRON 31  TIBC 178*  FERRITIN 362*  TSat 17  Studies/Results: Dg Chest 2 View  01/08/2016  CLINICAL  DATA:  Patient with weakness. EXAM: CHEST  2 VIEW COMPARISON:  Chest radiograph 12/16/2015. FINDINGS: Limited evaluation due to overlapping soft tissue. Stable enlarged cardiac and mediastinal contours. No large area of pulmonary consolidation. No pleural effusion or pneumothorax. IMPRESSION: Limited exam.  No acute cardiopulmonary process. Electronically Signed   By: Lovey Newcomer M.D.   On: 01/08/2016 19:29   Medications:   . amantadine  100 mg Oral BID  . ARIPiprazole  15 mg Oral BID  . atorvastatin  40 mg Oral QHS  . benztropine  0.5 mg Oral BID  . Chlorhexidine Gluconate Cloth  6 each Topical Q0600  . divalproex  500 mg Oral BID  . divalproex  750 mg Oral QHS  . feeding supplement (NEPRO CARB STEADY)  237 mL Oral BID BM  . levothyroxine  75 mcg Oral QAC breakfast  . mometasone-formoterol  2 puff Inhalation BID  .  multivitamin with minerals  1 tablet Oral Daily  . mupirocin ointment  1 application Nasal BID   Background 55 yr female with a past medical history significant for bipolar dz, breast CA, DM , HTN, hypothyroidism, anemia of chronic disease and CKD stage 4 with baseline Scr of around 3-3.4 who was seen in consultation on 12/12/15 by our service due to AKI/CKD, hypernatremia, SOB and confusion. She had been on Ramipril and Meloxicam at time of that admission and her Scr improved with cessation of ACE, NSAID as well as IVF's. She also required one session of HD due to hyperkalemia, however she was deemed unsuited for longterm dialysis. She now presents from her SNF for abnormal labs, most significant for Scr of 8.88. We were again been consulted to further evaluate AKI/CKD. She is normally followed by Dr. Justin Mend in our office, however has not been seen since November 2016. She is a poor historian and only reports feeling weak. No history of N/V/D and it does not appear that her ACE or NSAID were restarted after discharge.   Creatinine trend for reference leading to  consultation 01/08/2016 06:28 PM 8.40* 0.44 - 1.00 mg/dL Final  01/08/2016 06:09 PM 8.88* 0.44 - 1.00 mg/dL Final  12/25/2015 05:10 AM 3.25* 0.44 - 1.00 mg/dL Final       Assessment/Plan: 1. AKI/CKD- pt appeared volume depleted with marked azotemia at time of consultation. She is not a suitable dialysis candidate as she had to be intubated and sedated to place a temp HD cath at her last admission (her mental illness would preclude safe dialysis for herself or her providers as well as lack of any family support due to her longstanding mental illness and estranged family members). Recommended treating conservatively with IVF's (getting NS at 50/hour and creatinine is improving). Urine not collected for FeNa,  no renal US done yet (not ordered? But I don't think needs), CPK 391 (not of rhabdo proportions) and I can't tell if urine eos sent or not. Based on what I am observing for the past 2 days she is in no way a candidate for dialysis either acutely or chronicallyF 2. Anemia of chronic disease- Hb 9.6. TSat 17, po Fe fine.  3. HTN- stable 4. Thrombocytopenia 5. DM- per primary svc 6. Protein malnutrition- supplement with nepro   From a renal standpoint she is improving with slow IVF and hopefully will get back to baseline creatinine of around 3 with continued slow hydration. She is in no way a dialysis candidate - either acute or chronic. I would recommend addressing her code status as she is still  full code.  Renal will sign off at this time as adding nothing to her current care.    Jamal Maes, MD [facility] [phone] pager 01/10/2016, 12:08 PM

## 2016-01-10 NOTE — Progress Notes (Signed)
PROGRESS NOTE    [name]  [id number] DOB: 11/10/1960 DOA: 01/08/2016 PCP: Benito Mccreedy, MD (Confirm with patient/family/NH records and if not entered, this HAS to be entered at [facility] point of entry. "No PCP" if truly none.) Outpatient Specialists: Theme park manager speciality and name if known)    Brief Narrative: (Start on day 1 of progress note - keep it brief and live) Patient with bipolar and chronic encephalopathy, ckd stage V with worsening renal function, not candidate for HD.   Assessment & Plan:   Principal Problem:   Renal failure (ARF), acute on chronic (HCC) Active Problems:   BIPOLAR AFFECTIVE DISORDER   Anemia in chronic kidney disease   HTN (hypertension)   COPD (chronic obstructive pulmonary disease) (HCC)   Hyperlipidemia   History of breast cancer in female  1. Cardiovascular.  Will continue gentle hydration, no clinical signs of volume overload, will continue to follow on blood pressure. Systolic [id number] and [id number].  2. Pulmonary. No signs of pulmonary edema or volume overload, will continue to monitor oxymetry, supplemental 02 per [place] as tolerated to target 02 sat above 92%  3. Nephrology. Patient incontinent, but noted decreasing cr down to 6,6 from 7,79. Cl at 11 and Na at 149, K at 4.1, will change IV fluids to dextrose and 1/2 Ns at 50 cc.hr, follow on renal panel in am, patient is not candidate for HD.   4. Neurology. Metabolic encephalopathy plus bipolar, will continue supportive care, added haldol for agitation, will continue amantadine, ariprazole, benztropine, divalproex, will avoid benzodiazepines. Protective devices on her hands to protect IV access.   5. Endocrinology continue on levothyroxine.     DVT prophylaxis:  Heparin Code Status:  dnr Family Communication:  Disposition Plan: (specify when and where you expect patient to be discharged)   Consultants:   nephrology  Procedures: (Don't include imaging studies which can be auto  populated. Include things that cannot be auto populated i.e. Echo, Carotid and venous dopplers, Foley, Bipap, HD, tubes/drains, wound vac, central lines etc)   Antimicrobials: (specify start and planned stop date. Auto populated tables are space occupying and do not give end dates)     Subjective: Patient has covering in her hands to prevent interference with medical care. Tolerating po diet, no nausea or vomiting.  Objective: Filed Vitals:   01/09/16 1806 01/09/16 2055 01/10/16 0440 01/10/16 0856  BP: 120/75 141/93 99/66 139/79  Pulse: 85 87 93 92  Temp: 98.3 F (36.8 C) 98.1 F (36.7 C) 98.2 F (36.8 C) 98.1 F (36.7 C)  TempSrc: Oral Oral Oral Oral  Resp: 18 18 19 18   Height:      Weight:      SpO2: 99% 99% 100% 96%    Intake/Output Summary (Last 24 hours) at 01/10/16 1053 Last data filed at 01/10/16 0600  Gross per 24 hour  Intake    240 ml  Output      0 ml  Net    240 ml   Filed Weights   01/08/16 1714 01/08/16 2056  Weight: 88.451 kg (195 lb) 85.3 kg (188 lb 0.8 oz)    Examination:  General exam: No interactive, not in pain or dyspnea. E-ENT: Oral mucosa dry, conjunctiva with pallor but no icterus. Respiratory system: Clear to auscultation. Respiratory effort normal. Poor inspiratory effort, vesicular breath sounds on anterior auscultation. Cardiovascular system: S1 & S2 heard, RRR. No JVD, murmurs, rubs, gallops or clicks. No pedal edema. Gastrointestinal system: Abdomen is nondistended, soft and nontender. No  organomegaly or masses felt. Normal bowel sounds heard. Central nervous system: Somnolent and hyporeactive, not following commands.  Extremities: Symmetric 5 x 5 power. Skin: No rashes, lesions or ulcers Psychiatry: Unable to assess due to mental status.  Data Reviewed: I have personally reviewed following labs and imaging studies  CBC:  Recent Labs Lab 01/08/16 1809 01/08/16 1828 01/10/16 0643  WBC 6.3  --  6.1  NEUTROABS 4.5  --  4.0  HGB  9.0* 10.9* 9.6*  HCT 29.2* 32.0* 31.8*  MCV 110.6*  --  114.0*  PLT 99*  --  90*   Basic Metabolic Panel:  Recent Labs Lab 01/08/16 1809 01/08/16 1828 01/09/16 0528 01/10/16 0643  NA 141 143 145 149*  K 4.1 4.0 3.8 4.1  CL 102 101 106 110  CO2 24  --  26 26  GLUCOSE 109* 106* 77 78  BUN 94* 95* 88* 80*  CREATININE 8.88* 8.40* 7.79* 6.69*  CALCIUM 9.7  --  9.6 10.1  MG 2.5*  --   --   --   PHOS 5.5*  --  5.0* 5.3*   GFR: Estimated Creatinine Clearance: 9.6 mL/min (by C-G formula based on Cr of 6.69). Liver Function Tests:  Recent Labs Lab 01/08/16 1809 01/09/16 0528 01/10/16 0643  AST 48*  --   --   ALT 28  --   --   ALKPHOS 93  --   --   BILITOT 0.8  --   --   PROT 6.4*  --   --   ALBUMIN 2.7* 2.3* 2.4*   No results for input(s): LIPASE, AMYLASE in the last 168 hours. No results for input(s): AMMONIA in the last 168 hours. Coagulation Profile: No results for input(s): INR, PROTIME in the last 168 hours. Cardiac Enzymes:  Recent Labs Lab 01/08/16 2141  CKTOTAL 391*   BNP (last 3 results) No results for input(s): PROBNP in the last 8760 hours. HbA1C: No results for input(s): HGBA1C in the last 72 hours. CBG: No results for input(s): GLUCAP in the last 168 hours. Lipid Profile: No results for input(s): CHOL, HDL, LDLCALC, TRIG, CHOLHDL, LDLDIRECT in the last 72 hours. Thyroid Function Tests: No results for input(s): TSH, T4TOTAL, FREET4, T3FREE, THYROIDAB in the last 72 hours. Anemia Panel:  Recent Labs  01/08/16 2141  VITAMINB12 1841*  FERRITIN 362*  TIBC 178*  IRON 31   Urine analysis:    Component Value Date/Time   COLORURINE YELLOW 01/08/2016 1934   APPEARANCEUR CLOUDY* 01/08/2016 1934   LABSPEC 1.014 01/08/2016 1934   PHURINE 6.0 01/08/2016 1934   GLUCOSEU NEGATIVE 01/08/2016 1934   HGBUR MODERATE* 01/08/2016 1934   BILIRUBINUR NEGATIVE 01/08/2016 1934   KETONESUR NEGATIVE 01/08/2016 1934   PROTEINUR 100* 01/08/2016 1934    UROBILINOGEN 0.2 06/28/2015 0343   NITRITE NEGATIVE 01/08/2016 1934   LEUKOCYTESUR SMALL* 01/08/2016 1934   Sepsis Labs: No results for input(s): PROCALCITON, LATICACIDVEN in the last 168 hours.  Recent Results (from the past 240 hour(s))  MRSA PCR Screening     Status: Abnormal   Collection Time: 01/08/16 10:03 PM  Result Value Ref Range Status   MRSA by PCR POSITIVE (A) NEGATIVE Final    Comment:        The GeneXpert MRSA Assay (FDA approved for NASAL specimens only), is one component of a comprehensive MRSA colonization surveillance program. It is not intended to diagnose MRSA infection nor to guide or monitor treatment for MRSA infections. RESULT CALLED TO, READ BACK BY  AND VERIFIED WITHBarnabas Harries RN [id number] 01/09/16 MITCHELL,L          Radiology Studies: Dg Chest 2 View  01/08/2016  CLINICAL DATA:  Patient with weakness. EXAM: CHEST  2 VIEW COMPARISON:  Chest radiograph 12/16/2015. FINDINGS: Limited evaluation due to overlapping soft tissue. Stable enlarged cardiac and mediastinal contours. No large area of pulmonary consolidation. No pleural effusion or pneumothorax. IMPRESSION: Limited exam.  No acute cardiopulmonary process. Electronically Signed   By: Lovey Newcomer M.D.   On: 01/08/2016 19:29        Scheduled Meds: . amantadine  100 mg Oral BID  . ARIPiprazole  15 mg Oral BID  . atorvastatin  40 mg Oral QHS  . benztropine  0.5 mg Oral BID  . Chlorhexidine Gluconate Cloth  6 each Topical Q0600  . divalproex  500 mg Oral BID  . divalproex  750 mg Oral QHS  . feeding supplement (NEPRO CARB STEADY)  237 mL Oral BID BM  . levothyroxine  75 mcg Oral QAC breakfast  . mometasone-formoterol  2 puff Inhalation BID  . multivitamin with minerals  1 tablet Oral Daily  . mupirocin ointment  1 application Nasal BID   Continuous Infusions:    LOS: 2 days       Donna Silverman Gerome Apley, MD [facility] Pager [phone]  If 7PM-7AM, please contact  night-coverage [email or web address] Password [email or web address] 01/10/2016, 10:53 AM

## 2016-01-10 NOTE — Consult Note (Signed)
Consultation Note Date: 01/10/2016   Patient Name: [name]  DOB: August 05, 1961  MRN: [id number]  Age / Sex: 55 y.o., female  PCP: Benito Mccreedy, MD Referring Physician: Tawni Millers, *  Reason for Consultation: Establishing goals of care, Hospice Evaluation and Non pain symptom management  HPI/Patient Profile: 55 y.o. female  with past medical history of Hypertension, COPD, hyperlipidemia, hypothyroidism, bipolar disorder, diabetes, chronic kidney disease, admitted on 01/08/2016 with acute on chronic renal failure requiring dialysis. She was brought into the emergency room after being found with hyperkalemia and her creatinine was markedly elevated from her baseline which was 3.1 and now date of admission 8.4. She has improved with fluids and one session of hemodialysis and her serum creatinine today is 6.69. Patient was seen by nephrologist in consultation and is not a candidate for ongoing dialysis. Medical management has been recommended..   Clinical Assessment and Goals of Care: Patient is alert, but acutely confused, thought processes are disorganized and speech often nonsensical. She is sitting at the nurses station for safety as she has had psychomotor restlessness.  Spoke to her brother, Mr. Jackelyn Knife by phone for goals of care discussion. Mr. Ishmael Holter is her only living relative and he resides in [place]. He shares that Takenya is divorced, with no children. He states she has been living in group homes most of her daughter life  NEXT OF Dereck Leep. Jackelyn Knife, [phone]    SUMMARY OF RECOMMENDATIONS   DNR/DNI No PEG tube or temporary feeding via NG tube Continue to treat the treatable such as IV fluids medication management for renal failure. When ready for discharge family is interested in pursuing hospice Did review with Mr. Ishmael Holter the patient is trending towards end-of-life and  would qualify for hospice. They are interested in pursuing hospice support either in a facility and more specifically hospice inpatient facility if she qualifies They understand that there will be no further hemodialysis and during agreement with this; that she would not be an ongoing candidate Code Status/Advance Care Planning:  DNR    Symptom Management:   Pain: No known chronic pain conditions continue to treat mild to moderate pain with Tylenol. If patient were to develop more daily symptoms would recommend Dilaudid in setting of her acute renal failure or  Oxycodone  Dyspnea: Continue targeted pulmonary treatments such as oxygen delivery as tolerated nebulizer treatments. She is not currently on any opioids. If she develops fluid overload and renal failure would recommend Dilaudid if IV is required or if patient is still able to take by mouth medicines oxycodone  Bipolar disorder continue per attending's recommendation of Abilify 15 mg twice a day, Cogentin, Depakote 500 mg twice a day. It is noted per chart review in the past patient has done well on Zyprexa.  Palliative Prophylaxis:   Aspiration, Bowel Regimen, Delirium Protocol, Frequent Pain Assessment, Oral Care and Turn Reposition  Additional Recommendations (Limitations, Scope, Preferences):  Minimize Medications, Initiate Comfort Feeding, No Artificial Feeding, No Hemodialysis, No Surgical Procedures and No Tracheostomy  Psycho-social/Spiritual:   Desire for further Chaplaincy support:no   Prognosis:   Less than 4 weeks in the setting of acute on chronic renal failure, creatinine 6.69, protein calorie malnutrition with an albumin of 2.4, COPD, hypertension, thrombocytopenia, diabetes as well as bipolar disorder  Discharge Planning: Family desires discharge to hospice inpatient facility for end-of-life care if she qualifies. Patient has lived in group homes up until this point. I am not sure that they are equipped to  handle patient's medical complexity at this point, and for see either hospice inpatient facility or a skilled nursing facility with hospice support. We'll place consult for hospice inpatient facility      Primary Diagnoses: Present on Admission:  . Renal failure (ARF), acute on chronic ([place]) . COPD (chronic obstructive pulmonary disease) ([place]) . BIPOLAR AFFECTIVE DISORDER . Anemia in chronic kidney disease . Hyperlipidemia . HTN (hypertension)  I have reviewed the medical record, interviewed the patient and family, and examined the patient. The following aspects are pertinent.  Past Medical History  Diagnosis Date  . Anemia 07/10/2011  . Thrombocytopenia ([place]) 07/10/2011  . HTN (hypertension)   . COPD (chronic obstructive pulmonary disease) ([place])   . Hyperlipidemia   . Hypothyroidism   . Bipolar affective ([place])   . DM (diabetes mellitus) ([place])   . Renal insufficiency   . 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 Hypertension    Social History   Social History  . Marital Status: Divorced    Spouse Name: N/A  . Number of Children: N/A  . Years of Education: N/A   Social History Main Topics  . Smoking status: Current Every Day Smoker -- 0.50 packs/day for 40 years    Types: Cigarettes, Cigars  . Smokeless tobacco: Never Used  . Alcohol Use: No  . Drug Use: No  . Sexual Activity: Not Asked   Other Topics Concern  . None   Social History Narrative   Family History  Problem Relation Age of Onset  . Diabetes Mother   . Cancer Father   . HIV Brother    Scheduled Meds: . amantadine  100 mg Oral BID  . ARIPiprazole  15 mg Oral BID  . atorvastatin  40 mg Oral QHS  . benztropine  0.5 mg Oral BID  . Chlorhexidine Gluconate Cloth  6 each Topical Q0600  . divalproex  500 mg Oral BID  . divalproex  750 mg Oral QHS  . feeding supplement (NEPRO CARB STEADY)  237 mL Oral BID BM  . levothyroxine  75 mcg Oral QAC breakfast  . mometasone-formoterol  2  puff Inhalation BID  . multivitamin with minerals  1 tablet Oral Daily  . mupirocin ointment  1 application Nasal BID   Continuous Infusions:  PRN Meds:.acetaminophen **OR** acetaminophen, albuterol, ondansetron **OR** ondansetron (ZOFRAN) IV Medications Prior to Admission:  Prior to Admission medications   Medication Sig Start Date End Date Taking? Authorizing Provider  acetaminophen (TYLENOL) 500 MG tablet Take 1 tablet (500 mg total) by mouth every 6 (six) hours as needed. Patient taking differently: Take 500 mg by mouth every 6 (six) hours as needed for mild pain.  08/16/15  Yes Carlisle Cater, PA-C  albuterol (PROVENTIL HFA;VENTOLIN HFA) 108 (90 BASE) MCG/ACT inhaler Inhale 1-2 puffs into the lungs every 6 (six) hours as needed for wheezing or shortness of breath.   Yes Historical Provider, MD  amantadine (SYMMETREL) 100 MG capsule Take 100 mg by mouth 2 (two)  times daily.   Yes Historical Provider, MD  ARIPiprazole (ABILIFY) 15 MG tablet Take 1 tablet (15 mg total) by mouth 2 (two) times daily in the am and at bedtime.. 07/14/15  Yes Benjamine Mola, FNP  atorvastatin (LIPITOR) 40 MG tablet Take 1 tablet (40 mg total) by mouth at bedtime. 07/14/15  Yes Benjamine Mola, FNP  benztropine (COGENTIN) 0.5 MG tablet Take 1 tablet (0.5 mg total) by mouth 2 (two) times daily. 07/14/15  Yes Benjamine Mola, FNP  divalproex (DEPAKOTE) 250 MG DR tablet Take 3 tablets (750 mg total) by mouth at bedtime. 07/14/15  Yes Benjamine Mola, FNP  divalproex (DEPAKOTE) 500 MG DR tablet Take 1 tablet (500 mg total) by mouth 2 (two) times daily at 8am and 3pm. Patient taking differently: Take 500 mg by mouth 2 (two) times daily.  07/14/15  Yes Benjamine Mola, FNP  Fluticasone-Salmeterol (ADVAIR) 250-50 MCG/DOSE AEPB Inhale 1 puff into the lungs 2 (two) times daily. 07/14/15  Yes Benjamine Mola, FNP  hydrocortisone cream 1 % Apply topically 3 (three) times daily. 07/14/15  Yes Benjamine Mola, FNP  levothyroxine  (SYNTHROID, LEVOTHROID) 75 MCG tablet Take 1 tablet (75 mcg total) by mouth daily before breakfast. 07/14/15  Yes Benjamine Mola, FNP  Multiple Vitamin (MULTIVITAMIN WITH MINERALS) TABS tablet Take 1 tablet by mouth daily. 07/14/15  Yes Benjamine Mola, FNP   Allergies  Allergen Reactions  . Doxycycline Other (See Comments)    REACTION: Unknown reaction  . Glipizide Other (See Comments)    unknown  . Ibuprofen Other (See Comments)    CANNOT TAKE IBUPROFEN OR MOTRIN DUE TO CURRENT MEDS  . Penicillins Other (See Comments)    Has patient had a PCN reaction causing immediate rash, facial/tongue/throat swelling, SOB or lightheadedness with hypotension: unknown Has patient had a PCN reaction causing severe rash involving mucus membranes or skin necrosis: unknown Has patient had a PCN reaction that required hospitalization Unknown Has patient had a PCN reaction occurring within the last 10 years: Unknown If all of the above answers are "NO", then may proceed with Cephalosporin use.   . Sulfonamide Derivatives     REACTION: Unknown reaction  . Tetracycline Other (See Comments)    REACTION: Unknown reaction  . Tolterodine Tartrate Itching  . Chlorostat [Chlorhexidine] Rash  . Cogentin [Benztropine] Rash  . Oxybutynin Chloride Itching and Rash   Review of Systems  Unable to perform ROS: Mental status change    Physical Exam  Constitutional: She appears well-developed and well-nourished.  Sitting at the nurses station  HENT:  Head: Normocephalic and atraumatic.  Neck: Normal range of motion.  Pulmonary/Chest: Effort normal.  Musculoskeletal: Normal range of motion.  Neurological:  Pt is alert. Unable to assess orientation so confused, disorganized  Skin: Skin is warm and dry.  Psychiatric:  Speech sometimes nonsensical; thought processes disorganized. Psychomotor restlessness  Nursing note and vitals reviewed.   Vital Signs: BP 139/79 mmHg  Pulse 92  Temp(Src) 98.1 F (36.7 C)  (Oral)  Resp 18  Ht 5\' 3"  (1.6 m)  Wt 85.3 kg (188 lb 0.8 oz)  BMI 33.32 kg/m2  SpO2 96% Pain Assessment: PAINAD   Pain Score: 0-No pain   SpO2: SpO2: 96 % O2 Device:SpO2: 96 % O2 Flow Rate: .   IO: Intake/output summary:  Intake/Output Summary (Last 24 hours) at 01/10/16 1357 Last data filed at 01/10/16 1000  Gross per 24 hour  Intake    240  ml  Output      0 ml  Net    240 ml    LBM: Last BM Date: 01/10/16 Baseline Weight: Weight: 88.451 kg (195 lb) Most recent weight: Weight: 85.3 kg (188 lb 0.8 oz)     Palliative Assessment/Data:   Flowsheet Rows        Most Recent Value   Intake Tab    Referral Department  Hospitalist   Unit at Time of Referral  Med/Surg Unit   Palliative Care Primary Diagnosis  Nephrology   Date Notified  01/09/16   Palliative Care Type  New Palliative care   Reason for referral  Clarify Goals of Care   Date of Admission  01/08/16   Date first seen by Palliative Care  01/10/16   # of days Palliative referral response time  1 Day(s)   # of days IP prior to Palliative referral  1   Clinical Assessment    Palliative Performance Scale Score  30%   Pain Max last 24 hours  Not able to report   Pain Min Last 24 hours  Not able to report   Dyspnea Max Last 24 Hours  Not able to report   Dyspnea Min Last 24 hours  Not able to report   Nausea Max Last 24 Hours  Not able to report   Nausea Min Last 24 Hours  Not able to report   Anxiety Max Last 24 Hours  Not able to report   Anxiety Min Last 24 Hours  Not able to report   Other Max Last 24 Hours  Not able to report   Psychosocial & Spiritual Assessment    Palliative Care Outcomes    Patient/Family meeting held?  Yes   Who was at the meeting?  brother in [place] via phone   Palliative Care Outcomes  Clarified goals of care      Time In: 1515 Time Out: 1600 Time Total: 75 min Greater than 50%  of this time was spent counseling and coordinating care related to the above assessment and  plan.  Signed by: Dory Horn, NP   Please contact Palliative Medicine Team phone at [phone] for questions and concerns.  For individual provider: See Shea Evans

## 2016-01-10 NOTE — Plan of Care (Signed)
Problem: Education: Goal: Knowledge of disease and its progression will improve Outcome: Not Progressing Unable to educate patient secondary to severe confusion.

## 2016-01-11 DIAGNOSIS — Z515 Encounter for palliative care: Secondary | ICD-10-CM | POA: Insufficient documentation

## 2016-01-11 LAB — BASIC METABOLIC PANEL
Anion gap: 12 (ref 5–15)
BUN: 77 mg/dL — ABNORMAL HIGH (ref 6–20)
CHLORIDE: 112 mmol/L — AB (ref 101–111)
CO2: 27 mmol/L (ref 22–32)
Calcium: 10.3 mg/dL (ref 8.9–10.3)
Creatinine, Ser: 6.35 mg/dL — ABNORMAL HIGH (ref 0.44–1.00)
GFR, EST AFRICAN AMERICAN: 8 mL/min — AB (ref >60)
GFR, EST NON AFRICAN AMERICAN: 7 mL/min — AB (ref >60)
Glucose, Bld: 83 mg/dL (ref 65–99)
POTASSIUM: 5.1 mmol/L (ref 3.5–5.1)
SODIUM: 151 mmol/L — AB (ref 135–145)

## 2016-01-11 LAB — GLUCOSE, CAPILLARY
GLUCOSE-CAPILLARY: 71 mg/dL (ref 65–99)
GLUCOSE-CAPILLARY: 80 mg/dL (ref 65–99)
Glucose-Capillary: 73 mg/dL (ref 65–99)

## 2016-01-11 MED ORDER — HALOPERIDOL LACTATE 5 MG/ML IJ SOLN
1.0000 mg | Freq: Once | INTRAMUSCULAR | Status: AC
  Administered 2016-01-11: 1 mg via INTRAMUSCULAR
  Filled 2016-01-11: qty 1

## 2016-01-11 NOTE — Progress Notes (Signed)
Patient has become increasingly agitated and combative throughout the night.  She spit out the majority of her PO nighttime medications.  [facility] called and made aware.  Received order for 1 mg IV lorazepam.  Will give and continue to monitor patient.  [employer] RN-BC, WTA.

## 2016-01-11 NOTE — Progress Notes (Signed)
PROGRESS NOTE    [name]  [id number] DOB: [birth year] DOA: 01/08/2016 PCP: Benito Mccreedy, MD (Confirm with patient/family/NH records and if not entered, this HAS to be entered at [facility] point of entry. "No PCP" if truly none.) Outpatient Specialists: Theme park manager speciality and name if known)    Brief Narrative: (Start on day 1 of progress note - keep it brief and live) Patient with bipolar and chronic encephalopathy, ckd stage V with worsening renal function, not candidate for HD.   Assessment & Plan:   Principal Problem:   Renal failure (ARF), acute on chronic ([facility]) Active Problems:   BIPOLAR AFFECTIVE DISORDER   Anemia in chronic kidney disease   HTN (hypertension)   COPD (chronic obstructive pulmonary disease) ([facility])   Hyperlipidemia   History of breast cancer in female   Palliative care encounter   1. Cardiovascular. Patient off IV fluids, blood pressure has been stable at [id number] to [id number] systolic.  2. Pulmonary. No signs of aspiration will continue to monitor oxymetry, supplemental 02 per [place] as tolerated to target 02 sat above 92%  3. Nephrology.  Cr continue to trend down, now at 6.35 and K at 5.1, will continue to follow renal panel in am, avoid hypotension and nephrotoxic meds, patient off IV fluids, with poor oral intake.  4. Neurology. Metabolic encephalopathy plus bipolar, will continue supportive care, will continue amantadine, ariprazole, benztropine, divalproex, will avoid benzodiazepines. Patient refusing po meds, will use haldol IM as needed for agitation. Palliative care consulted.   5. Endocrinology continue on levothyroxine, if continue to refuse meds will have to change to IV levothyroxine (equivelant dose).   DVT prophylaxis: (Lovenox/Heparin/SCD's/anticoagulated/None (if comfort care) Code Status: (Full/Partial - specify details) Family Communication: (Specify name, relationship & date discussed. NO "discussed with patient") Disposition Plan:  (specify when and where you expect patient to be discharged)   Consultants:     Procedures: (Don't include imaging studies which can be auto populated. Include things that cannot be auto populated i.e. Echo, Carotid and venous dopplers, Foley, Bipap, HD, tubes/drains, wound vac, central lines etc)   Antimicrobials: (specify start and planned stop date. Auto populated tables are space occupying and do not give end dates)  Subjective: Patient more calm this am, still not interactive, has been off IV fluids, poor oral intake, incontinent, not able to get history due to her mentation.   Objective: Filed Vitals:   01/10/16 0440 01/10/16 0856 01/10/16 2339 01/11/16 0610  BP: 99/66 139/79 130/82 141/92  Pulse: 93 92 107 96  Temp: 98.2 F (36.8 C) 98.1 F (36.7 C)    TempSrc: Oral Oral    Resp: 19 18 22 19   Height:      Weight:      SpO2: 100% 96%      Intake/Output Summary (Last 24 hours) at 01/11/16 0909 Last data filed at 01/11/16 0600  Gross per 24 hour  Intake    150 ml  Output      0 ml  Net    150 ml   Filed Weights   01/08/16 1714 01/08/16 2056  Weight: 88.451 kg (195 lb) 85.3 kg (188 lb 0.8 oz)    Examination:  General exam:  Deconditioned E-ENT: positive conjunctival pallor, oral mucosa dry.  Respiratory system: Clear to auscultation. Respiratory effort normal. Vesicular breath sounds on anterior auscultation. Cardiovascular system: S1 & S2 heard, RRR. No JVD, murmurs, rubs, gallops or clicks. No significant edema. Gastrointestinal system: Abdomen is nondistended, soft and nontender. No organomegaly or  masses felt. Normal bowel sounds heard. Central nervous system: awake, not interactive, non focal. Extremities: Symmetric 5 x 5 power. Skin: No rashes, lesions or ulcers .   Data Reviewed: I have personally reviewed following labs and imaging studies  CBC:  Recent Labs Lab 01/08/16 1809 01/08/16 1828 01/10/16 0643  WBC 6.3  --  6.1  NEUTROABS 4.5  --   4.0  HGB 9.0* 10.9* 9.6*  HCT 29.2* 32.0* 31.8*  MCV 110.6*  --  114.0*  PLT 99*  --  90*   Basic Metabolic Panel:  Recent Labs Lab 01/08/16 1809 01/08/16 1828 01/09/16 0528 01/10/16 0643 01/11/16 0745  NA 141 143 145 149* 151*  K 4.1 4.0 3.8 4.1 5.1  CL 102 101 106 110 112*  CO2 24  --  26 26 27   GLUCOSE 109* 106* 77 78 83  BUN 94* 95* 88* 80* 77*  CREATININE 8.88* 8.40* 7.79* 6.69* 6.35*  CALCIUM 9.7  --  9.6 10.1 10.3  MG 2.5*  --   --   --   --   PHOS 5.5*  --  5.0* 5.3*  --    GFR: Estimated Creatinine Clearance: 10.1 mL/min (by C-G formula based on Cr of 6.35). Liver Function Tests:  Recent Labs Lab 01/08/16 1809 01/09/16 0528 01/10/16 0643  AST 48*  --   --   ALT 28  --   --   ALKPHOS 93  --   --   BILITOT 0.8  --   --   PROT 6.4*  --   --   ALBUMIN 2.7* 2.3* 2.4*   No results for input(s): LIPASE, AMYLASE in the last 168 hours. No results for input(s): AMMONIA in the last 168 hours. Coagulation Profile: No results for input(s): INR, PROTIME in the last 168 hours. Cardiac Enzymes:  Recent Labs Lab 01/08/16 2141  CKTOTAL 391*   BNP (last 3 results) No results for input(s): PROBNP in the last 8760 hours. HbA1C: No results for input(s): HGBA1C in the last 72 hours. CBG:  Recent Labs Lab 01/11/16 0845  GLUCAP 71   Lipid Profile: No results for input(s): CHOL, HDL, LDLCALC, TRIG, CHOLHDL, LDLDIRECT in the last 72 hours. Thyroid Function Tests: No results for input(s): TSH, T4TOTAL, FREET4, T3FREE, THYROIDAB in the last 72 hours. Anemia Panel:  Recent Labs  01/08/16 2141  VITAMINB12 1841*  FERRITIN 362*  TIBC 178*  IRON 31   Urine analysis:    Component Value Date/Time   COLORURINE YELLOW 01/08/2016 1934   APPEARANCEUR CLOUDY* 01/08/2016 1934   LABSPEC 1.014 01/08/2016 1934   PHURINE 6.0 01/08/2016 1934   GLUCOSEU NEGATIVE 01/08/2016 1934   HGBUR MODERATE* 01/08/2016 1934   BILIRUBINUR NEGATIVE 01/08/2016 1934   KETONESUR  NEGATIVE 01/08/2016 1934   PROTEINUR 100* 01/08/2016 1934   UROBILINOGEN 0.2 06/28/2015 0343   NITRITE NEGATIVE 01/08/2016 1934   LEUKOCYTESUR SMALL* 01/08/2016 1934   Sepsis Labs: No results for input(s): PROCALCITON, LATICACIDVEN in the last 168 hours.  Recent Results (from the past 240 hour(s))  MRSA PCR Screening     Status: Abnormal   Collection Time: 01/08/16 10:03 PM  Result Value Ref Range Status   MRSA by PCR POSITIVE (A) NEGATIVE Final    Comment:        The GeneXpert MRSA Assay (FDA approved for NASAL specimens only), is one component of a comprehensive MRSA colonization surveillance program. It is not intended to diagnose MRSA infection nor to guide or monitor treatment for MRSA  infections. RESULT CALLED TO, READ BACK BY AND VERIFIED WITHBarnabas Harries RN [id number] 01/09/16 MITCHELL,L          Radiology Studies: No results found.      Scheduled Meds: . amantadine  100 mg Oral BID  . ARIPiprazole  15 mg Oral BID  . atorvastatin  40 mg Oral QHS  . benztropine  0.5 mg Oral BID  . Chlorhexidine Gluconate Cloth  6 each Topical Q0600  . divalproex  500 mg Oral BID  . divalproex  750 mg Oral QHS  . feeding supplement (NEPRO CARB STEADY)  237 mL Oral BID BM  . heparin subcutaneous  5,000 Units Subcutaneous Q8H  . levothyroxine  75 mcg Oral QAC breakfast  . mometasone-formoterol  2 puff Inhalation BID  . multivitamin with minerals  1 tablet Oral Daily  . mupirocin ointment  1 application Nasal BID   Continuous Infusions: . dextrose 5 % and 0.45% NaCl       LOS: 3 days        Mauricio Gerome Apley, MD [facility] Pager [phone]  If 7PM-7AM, please contact night-coverage [email or web address] Password [email or web address] 01/11/2016, 9:09 AM

## 2016-01-12 LAB — BASIC METABOLIC PANEL
Anion gap: 10 (ref 5–15)
BUN: 77 mg/dL — AB (ref 6–20)
CALCIUM: 10.2 mg/dL (ref 8.9–10.3)
CO2: 28 mmol/L (ref 22–32)
CREATININE: 6.38 mg/dL — AB (ref 0.44–1.00)
Chloride: 112 mmol/L — ABNORMAL HIGH (ref 101–111)
GFR calc Af Amer: 8 mL/min — ABNORMAL LOW (ref >60)
GFR, EST NON AFRICAN AMERICAN: 7 mL/min — AB (ref >60)
GLUCOSE: 80 mg/dL (ref 65–99)
Potassium: 4.1 mmol/L (ref 3.5–5.1)
Sodium: 150 mmol/L — ABNORMAL HIGH (ref 135–145)

## 2016-01-12 LAB — FOLATE RBC
Folate, Hemolysate: 584.3 ng/mL
Folate, RBC: 2140 ng/mL (ref >498)
Hematocrit: 27.3 % — ABNORMAL LOW (ref 34.0–46.6)

## 2016-01-12 LAB — GLUCOSE, CAPILLARY
GLUCOSE-CAPILLARY: 93 mg/dL (ref 65–99)
Glucose-Capillary: 83 mg/dL (ref 65–99)
Glucose-Capillary: 205 mg/dL — ABNORMAL HIGH (ref 65–99)

## 2016-01-12 MED ORDER — HALOPERIDOL LACTATE 5 MG/ML IJ SOLN
1.0000 mg | Freq: Four times a day (QID) | INTRAMUSCULAR | Status: DC | PRN
  Administered 2016-01-12: 1 mg via INTRAMUSCULAR
  Filled 2016-01-12: qty 1

## 2016-01-12 MED ORDER — HALOPERIDOL LACTATE 5 MG/ML IJ SOLN
1.0000 mg | Freq: Once | INTRAMUSCULAR | Status: AC
  Administered 2016-01-12: 1 mg via INTRAMUSCULAR
  Filled 2016-01-12: qty 1

## 2016-01-12 NOTE — Progress Notes (Signed)
PROGRESS NOTE    [name]  [id number] DOB: 1960/12/08 DOA: 01/08/2016 PCP: Benito Mccreedy, MD (Confirm with patient/family/NH records and if not entered, this HAS to be entered at [facility] point of entry. "No PCP" if truly none.) Outpatient Specialists: Theme park manager speciality and name if known)    Brief Narrative: (Start on day 1 of progress note - keep it brief and live) Patient with bipolar and chronic encephalopathy, ckd stage V with worsening renal function, not candidate for HD.   Assessment & Plan:   Principal Problem:   Renal failure (ARF), acute on chronic (HCC) Active Problems:   BIPOLAR AFFECTIVE DISORDER   Anemia in chronic kidney disease   HTN (hypertension)   COPD (chronic obstructive pulmonary disease) (HCC)   Hyperlipidemia   History of breast cancer in female   Palliative care encounter   1. Cardiovascular. Patient hemodynamic stable, no signs of volume overload.  2. Pulmonary. No signs of aspiration will continue to monitor oxymetry, supplemental 02 per Sultan as tolerated to target 02 sat above 92%  3. Nephrology. Renal function with cr at 6.38 from 6.35 with Na at 150 and K at 4,1 very poor oral intake, patient not candidate for hemodialysis, patient agitated and pulling lines. Will follow renal function in am, may  need to resume IV fluids but patient has a very poor prognosis, palliative care consulted.   4. Neurology. Metabolic encephalopathy plus bipolar, will continue amantadine, ariprazole, benztropine, divalproex, will avoid benzodiazepines. Persistent agitation, will place for haldol IM q 4 hours a needed for agitation.  5. Endocrinology continue on levothyroxine, as tolerated. Patient is a very poor prognosis.   DVT prophylaxis: heparin Code Status: (Full/Partial - specify details) Family Communication: (Specify name, relationship & date discussed. NO "discussed with patient") Disposition Plan: (specify when and where you expect patient to be  discharged)   Consultants:   Nephrology  Palliative care   Procedures: (Don't include imaging studies which can be auto populated. Include things that cannot be auto populated i.e. Echo, Carotid and venous dopplers, Foley, Bipap, HD, tubes/drains, wound vac, central lines etc)    Antimicrobials: (specify start and planned stop date. Auto populated tables are space occupying and do not give end dates)      Subjective: Patient unable to give any history, has been very agitated, required antipsychotics IM, all information from nursing, patient unable to give any history due to poor mentation.  Objective: Filed Vitals:   01/11/16 0610 01/11/16 0900 01/12/16 0632 01/12/16 0902  BP: 141/92 128/76 129/84 151/71  Pulse: 96 96 90 87  Temp:   97.5 F (36.4 C) 97.8 F (36.6 C)  TempSrc:   Oral Oral  Resp: 19 22 20 20   Height:      Weight:      SpO2:   92% 92%    Intake/Output Summary (Last 24 hours) at 01/12/16 1202 Last data filed at 01/12/16 1000  Gross per 24 hour  Intake    160 ml  Output      4 ml  Net    156 ml   Filed Weights   01/08/16 1714 01/08/16 2056  Weight: 88.451 kg (195 lb) 85.3 kg (188 lb 0.8 oz)    Examination:  General exam: deconditioned and agitated ENT: Oral mucosa dry, conjunctivae mild pale. Respiratory system: Clear to auscultation. Respiratory effort normal. Poor inspiratory effort, vesicular breath sounds on anterior apical regions, bilaterally. Cardiovascular system: S1 & S2 heard, RRR. No JVD, murmurs, rubs, gallops or clicks. No  pedal edema. Gastrointestinal system: Abdomen is nondistended, soft and nontender. No organomegaly or masses felt. Normal bowel sounds heard. Central nervous system: awake, not interactive, agitated, non focal. Extremities: Symmetric 5 x 5 power. Skin: No rashes, lesions or ulcers Psychiatry: Judgement and insight appear normal. Mood & affect appropriate.     Data Reviewed: I have personally reviewed following  labs and imaging studies  CBC:  Recent Labs Lab 01/08/16 1809 01/08/16 1828 01/10/16 0643  WBC 6.3  --  6.1  NEUTROABS 4.5  --  4.0  HGB 9.0* 10.9* 9.6*  HCT 29.2* 32.0* 31.8*  MCV 110.6*  --  114.0*  PLT 99*  --  90*   Basic Metabolic Panel:  Recent Labs Lab 01/08/16 1809 01/08/16 1828 01/09/16 0528 01/10/16 0643 01/11/16 0745 01/12/16 0611  NA 141 143 145 149* 151* 150*  K 4.1 4.0 3.8 4.1 5.1 4.1  CL 102 101 106 110 112* 112*  CO2 24  --  26 26 27 28   GLUCOSE 109* 106* 77 78 83 80  BUN 94* 95* 88* 80* 77* 77*  CREATININE 8.88* 8.40* 7.79* 6.69* 6.35* 6.38*  CALCIUM 9.7  --  9.6 10.1 10.3 10.2  MG 2.5*  --   --   --   --   --   PHOS 5.5*  --  5.0* 5.3*  --   --    GFR: Estimated Creatinine Clearance: 10.1 mL/min (by C-G formula based on Cr of 6.38). Liver Function Tests:  Recent Labs Lab 01/08/16 1809 01/09/16 0528 01/10/16 0643  AST 48*  --   --   ALT 28  --   --   ALKPHOS 93  --   --   BILITOT 0.8  --   --   PROT 6.4*  --   --   ALBUMIN 2.7* 2.3* 2.4*   No results for input(s): LIPASE, AMYLASE in the last 168 hours. No results for input(s): AMMONIA in the last 168 hours. Coagulation Profile: No results for input(s): INR, PROTIME in the last 168 hours. Cardiac Enzymes:  Recent Labs Lab 01/08/16 2141  CKTOTAL 391*   BNP (last 3 results) No results for input(s): PROBNP in the last 8760 hours. HbA1C: No results for input(s): HGBA1C in the last 72 hours. CBG:  Recent Labs Lab 01/11/16 0845 01/11/16 1206 01/11/16 1757 01/12/16 0749  GLUCAP 71 73 80 83   Lipid Profile: No results for input(s): CHOL, HDL, LDLCALC, TRIG, CHOLHDL, LDLDIRECT in the last 72 hours. Thyroid Function Tests: No results for input(s): TSH, T4TOTAL, FREET4, T3FREE, THYROIDAB in the last 72 hours. Anemia Panel: No results for input(s): VITAMINB12, FOLATE, FERRITIN, TIBC, IRON, RETICCTPCT in the last 72 hours. Urine analysis:    Component Value Date/Time    COLORURINE YELLOW 01/08/2016 1934   APPEARANCEUR CLOUDY* 01/08/2016 1934   LABSPEC 1.014 01/08/2016 1934   PHURINE 6.0 01/08/2016 1934   GLUCOSEU NEGATIVE 01/08/2016 1934   HGBUR MODERATE* 01/08/2016 1934   BILIRUBINUR NEGATIVE 01/08/2016 1934   KETONESUR NEGATIVE 01/08/2016 1934   PROTEINUR 100* 01/08/2016 1934   UROBILINOGEN 0.2 06/28/2015 0343   NITRITE NEGATIVE 01/08/2016 1934   LEUKOCYTESUR SMALL* 01/08/2016 1934   Sepsis Labs: No results for input(s): PROCALCITON, LATICACIDVEN in the last 168 hours.  Recent Results (from the past 240 hour(s))  MRSA PCR Screening     Status: Abnormal   Collection Time: 01/08/16 10:03 PM  Result Value Ref Range Status   MRSA by PCR POSITIVE (A) NEGATIVE Final  Comment:        The GeneXpert MRSA Assay (FDA approved for NASAL specimens only), is one component of a comprehensive MRSA colonization surveillance program. It is not intended to diagnose MRSA infection nor to guide or monitor treatment for MRSA infections. RESULT CALLED TO, READ BACK BY AND VERIFIED WITHBarnabas Harries RN [id number] 01/09/16 MITCHELL,L          Radiology Studies: No results found.      Scheduled Meds: . amantadine  100 mg Oral BID  . ARIPiprazole  15 mg Oral BID  . atorvastatin  40 mg Oral QHS  . benztropine  0.5 mg Oral BID  . Chlorhexidine Gluconate Cloth  6 each Topical Q0600  . divalproex  500 mg Oral BID  . divalproex  750 mg Oral QHS  . feeding supplement (NEPRO CARB STEADY)  237 mL Oral BID BM  . heparin subcutaneous  5,000 Units Subcutaneous Q8H  . levothyroxine  75 mcg Oral QAC breakfast  . mometasone-formoterol  2 puff Inhalation BID  . multivitamin with minerals  1 tablet Oral Daily  . mupirocin ointment  1 application Nasal BID   Continuous Infusions: . dextrose 5 % and 0.45% NaCl       LOS: 4 days        [name] Gerome Apley, MD [facility] Pager [phone]  If 7PM-7AM, please contact  night-coverage [email or web address] Password [email or web address] 01/12/2016, 12:02 PM

## 2016-01-12 NOTE — Progress Notes (Signed)
Patient has again become increasingly agitated, restless, and resistant to staff turning and cleaning her after incontinent episodes.  Attempted to give HS medications with chocolate pudding.  She spit the bite out and refused to take any further bites.  MD called and made aware.  Order for Haldol 1 mg IM was given at 0048.  Will continue to monitor patient.  [employer] RN-BC, WTA.

## 2016-01-12 NOTE — Progress Notes (Signed)
CSW sent referral to [facility] for assessment.  Percell Locus Claris Pech LCSWA [phone]

## 2016-01-13 DIAGNOSIS — J449 Chronic obstructive pulmonary disease, unspecified: Secondary | ICD-10-CM

## 2016-01-13 DIAGNOSIS — N179 Acute kidney failure, unspecified: Principal | ICD-10-CM

## 2016-01-13 DIAGNOSIS — I10 Essential (primary) hypertension: Secondary | ICD-10-CM

## 2016-01-13 DIAGNOSIS — N189 Chronic kidney disease, unspecified: Secondary | ICD-10-CM

## 2016-01-13 DIAGNOSIS — F319 Bipolar disorder, unspecified: Secondary | ICD-10-CM

## 2016-01-13 LAB — BASIC METABOLIC PANEL
Anion gap: 14 (ref 5–15)
BUN: 78 mg/dL — AB (ref 6–20)
CO2: 28 mmol/L (ref 22–32)
Calcium: 10.6 mg/dL — ABNORMAL HIGH (ref 8.9–10.3)
Chloride: 112 mmol/L — ABNORMAL HIGH (ref 101–111)
Creatinine, Ser: 6.24 mg/dL — ABNORMAL HIGH (ref 0.44–1.00)
GFR calc Af Amer: 8 mL/min — ABNORMAL LOW (ref >60)
GFR, EST NON AFRICAN AMERICAN: 7 mL/min — AB (ref >60)
GLUCOSE: 84 mg/dL (ref 65–99)
POTASSIUM: 3.9 mmol/L (ref 3.5–5.1)
Sodium: 154 mmol/L — ABNORMAL HIGH (ref 135–145)

## 2016-01-13 LAB — GLUCOSE, CAPILLARY: Glucose-Capillary: 77 mg/dL (ref 65–99)

## 2016-01-13 MED ORDER — LORAZEPAM 0.5 MG PO TABS: 0.5000 mg | ORAL_TABLET | Freq: Three times a day (TID) | ORAL | Status: AC

## 2016-01-13 MED ORDER — OXYCODONE-ACETAMINOPHEN 5-325 MG PO TABS: 1.0000 | ORAL_TABLET | ORAL | Status: AC | PRN

## 2016-01-13 NOTE — Discharge Summary (Signed)
Physician Discharge Summary  [name] [id number] DOB: Sep 06, 1960 DOA: 01/08/2016  PCP: Benito Mccreedy, MD  Admit date: 01/08/2016 Discharge date: 01/13/2016  Time spent: >35 minutes  Recommendations for Outpatient Follow-up:  Hospice. MD at the facility    Discharge Diagnoses:  Principal Problem:   Renal failure (ARF), acute on chronic (HCC) Active Problems:   BIPOLAR AFFECTIVE DISORDER   Anemia in chronic kidney disease   HTN (hypertension)   COPD (chronic obstructive pulmonary disease) (HCC)   Hyperlipidemia   History of breast cancer in female   Palliative care encounter   Discharge Condition: prognosis is poor   Diet recommendation: comfort   Filed Weights   01/08/16 1714 01/08/16 2056 01/12/16 2121  Weight: 88.451 kg (195 lb) 85.3 kg (188 lb 0.8 oz) 79.7 kg (175 lb 11.3 oz)    History of present illness/hospital Course:  55 y/o female with PMH of HTN, COPD, Bipolar Disorder (ongoing mental health issues), CKD was admitted with renal failure, and requiring dialysis for hyperkalemia. Patient was evaluated by nephrologist who thought that patient is not a good candidate for long term HD.  Patient was evaluated by palliative care team. Unfortunately, patient is trending towards end-of-life and he qualified for hospice. They d/w patient/family and expressed interest in pursuing hospice support at the facility. Patient is being discharged under hospice care, prognosis remains poor, cont supportive, comfort care    Procedures:  HD (i.e. Studies not automatically included, echos, thoracentesis, etc; not x-rays)  Consultations:  Nephrology  Palliative care   Discharge Exam: Filed Vitals:   01/13/16 0626 01/13/16 0840  BP: 120/81 128/76  Pulse: 88 86  Temp: 97.7 F (36.5 C) 97.6 F (36.4 C)  Resp: 18 18    General: ale, confused  Cardiovascular: s1,s2 rrr Respiratory: few crackles LL  Discharge Instructions  Discharge Instructions    Diet -  low sodium heart healthy    Complete by:  As directed      Increase activity slowly    Complete by:  As directed             Medication List    STOP taking these medications        amantadine 100 MG capsule  Commonly known as:  SYMMETREL     atorvastatin 40 MG tablet  Commonly known as:  LIPITOR     hydrocortisone cream 1 %     levothyroxine 75 MCG tablet  Commonly known as:  SYNTHROID, LEVOTHROID     multivitamin with minerals Tabs tablet      TAKE these medications        acetaminophen 500 MG tablet  Commonly known as:  TYLENOL  Take 1 tablet (500 mg total) by mouth every 6 (six) hours as needed.     albuterol 108 (90 Base) MCG/ACT inhaler  Commonly known as:  PROVENTIL HFA;VENTOLIN HFA  Inhale 1-2 puffs into the lungs every 6 (six) hours as needed for wheezing or shortness of breath.     ARIPiprazole 15 MG tablet  Commonly known as:  ABILIFY  Take 1 tablet (15 mg total) by mouth 2 (two) times daily in the am and at bedtime..     benztropine 0.5 MG tablet  Commonly known as:  COGENTIN  Take 1 tablet (0.5 mg total) by mouth 2 (two) times daily.     divalproex 500 MG DR tablet  Commonly known as:  DEPAKOTE  Take 1 tablet (500 mg total) by mouth 2 (two) times daily at 8am  and 3pm.     divalproex 250 MG DR tablet  Commonly known as:  DEPAKOTE  Take 3 tablets (750 mg total) by mouth at bedtime.     Fluticasone-Salmeterol 250-50 MCG/DOSE Aepb  Commonly known as:  ADVAIR  Inhale 1 puff into the lungs 2 (two) times daily.     LORazepam 0.5 MG tablet  Commonly known as:  ATIVAN  Take 1 tablet (0.5 mg total) by mouth every 8 (eight) hours.     oxyCODONE-acetaminophen 5-325 MG tablet  Commonly known as:  ROXICET  Take 1-2 tablets by mouth every 4 (four) hours as needed for severe pain.       Allergies  Allergen Reactions  . Doxycycline Other (See Comments)    REACTION: Unknown reaction  . Glipizide Other (See Comments)    unknown  . Ibuprofen Other (See  Comments)    CANNOT TAKE IBUPROFEN OR MOTRIN DUE TO CURRENT MEDS  . Penicillins Other (See Comments)    Has patient had a PCN reaction causing immediate rash, facial/tongue/throat swelling, SOB or lightheadedness with hypotension: unknown Has patient had a PCN reaction causing severe rash involving mucus membranes or skin necrosis: unknown Has patient had a PCN reaction that required hospitalization Unknown Has patient had a PCN reaction occurring within the last 10 years: Unknown If all of the above answers are "NO", then may proceed with Cephalosporin use.   . Sulfonamide Derivatives     REACTION: Unknown reaction  . Tetracycline Other (See Comments)    REACTION: Unknown reaction  . Tolterodine Tartrate Itching  . Chlorostat [Chlorhexidine] Rash  . Cogentin [Benztropine] Rash  . Oxybutynin Chloride Itching and Rash      The results of significant diagnostics from this hospitalization (including imaging, microbiology, ancillary and laboratory) are listed below for reference.    Significant Diagnostic Studies: Dg Chest 2 View  01/08/2016  CLINICAL DATA:  Patient with weakness. EXAM: CHEST  2 VIEW COMPARISON:  Chest radiograph 12/16/2015. FINDINGS: Limited evaluation due to overlapping soft tissue. Stable enlarged cardiac and mediastinal contours. No large area of pulmonary consolidation. No pleural effusion or pneumothorax. IMPRESSION: Limited exam.  No acute cardiopulmonary process. Electronically Signed   By: Lovey Newcomer M.D.   On: 01/08/2016 19:29   Dg Chest Port 1 View  12/16/2015  CLINICAL DATA:  Acute respiratory failure, COPD,, healthcare associated pneumonia, acute and chronic renal failure, sepsis. EXAM: PORTABLE CHEST 1 VIEW COMPARISON:  Portable chest x-ray of December 13, 2015 FINDINGS: The lungs are reasonably well inflated. There is a trace of pleural fluid at the right lung base. There is a stable small left pleural effusion. The cardiac silhouette remains enlarged. The  pulmonary vascularity is mildly engorged. The pulmonary interstitial markings are more conspicuous today. The endotracheal tube tip lies 3.7 cm above the carina. The esophagogastric tube tip projects below the inferior margin of the image. The dual-lumen dialysis type catheter tip projects over the proximal SVC. IMPRESSION: Slight interval deterioration in the appearance of the chest with worsening of interstitial edema and small pleural effusions. Electronically Signed   By: David  Martinique M.D.   On: 12/16/2015 07:09    Microbiology: Recent Results (from the past 240 hour(s))  MRSA PCR Screening     Status: Abnormal   Collection Time: 01/08/16 10:03 PM  Result Value Ref Range Status   MRSA by PCR POSITIVE (A) NEGATIVE Final    Comment:        The GeneXpert MRSA Assay (FDA approved for NASAL  specimens only), is one component of a comprehensive MRSA colonization surveillance program. It is not intended to diagnose MRSA infection nor to guide or monitor treatment for MRSA infections. RESULT CALLED TO, READ BACK BY AND VERIFIED WITH: BOKIAGON,C RN 0234 01/09/16 MITCHELL,L      Labs: Basic Metabolic Panel:  Recent Labs Lab 01/08/16 1809  01/09/16 [id number] 01/10/16 [id number] 01/11/16 0745 01/12/16 0611 01/13/16 0622  NA 141  < > 145 149* 151* 150* 154*  K 4.1  < > 3.8 4.1 5.1 4.1 3.9  CL 102  < > 106 110 112* 112* 112*  CO2 24  --  26 26 27 28 28   GLUCOSE 109*  < > 77 78 83 80 84  BUN 94*  < > 88* 80* 77* 77* 78*  CREATININE 8.88*  < > 7.79* 6.69* 6.35* 6.38* 6.24*  CALCIUM 9.7  --  9.6 10.1 10.3 10.2 10.6*  MG 2.5*  --   --   --   --   --   --   PHOS 5.5*  --  5.0* 5.3*  --   --   --   < > = values in this interval not displayed. Liver Function Tests:  Recent Labs Lab 01/08/16 1809 01/09/16 0528 01/10/16 0643  AST 48*  --   --   ALT 28  --   --   ALKPHOS 93  --   --   BILITOT 0.8  --   --   PROT 6.4*  --   --   ALBUMIN 2.7* 2.3* 2.4*   No results for input(s): LIPASE,  AMYLASE in the last 168 hours. No results for input(s): AMMONIA in the last 168 hours. CBC:  Recent Labs Lab 01/08/16 1809 01/08/16 1828 01/08/16 2141 01/10/16 0643  WBC 6.3  --   --  6.1  NEUTROABS 4.5  --   --  4.0  HGB 9.0* 10.9*  --  9.6*  HCT 29.2* 32.0* 27.3* 31.8*  MCV 110.6*  --   --  114.0*  PLT 99*  --   --  90*   Cardiac Enzymes:  Recent Labs Lab 01/08/16 2141  CKTOTAL 391*   BNP: BNP (last 3 results)  Recent Labs  12/08/15 1205  BNP 556.7*    ProBNP (last 3 results) No results for input(s): PROBNP in the last 8760 hours.  CBG:  Recent Labs Lab 01/11/16 1757 01/12/16 0749 01/12/16 1153 01/12/16 2218 01/13/16 0759  GLUCAP 80 83 205* 93 77       Signed:  Jeena Arnett N  [facility] 01/13/2016, 2:35 PM

## 2016-01-13 NOTE — Consult Note (Addendum)
   [facility] CM Inpatient Consult   01/13/2016  [name] 1960-11-15 [id number]   [facility] Care Management follow up for potential [facility] Care Management services. Chart reviewed. Noted discharge plan is for hospice at this time.  There are no identifiable [facility] Care Management needs at this time. If patient's post hospital needs change, please place a [facility] Care Management consult.   Addendum: Made aware that patient will go to SNF at discharge not hospice. Will not engage for [facility] Care Management at this time.    Marthenia Rolling, MSN-Ed, RN,BSN [facility] Liaison [phone]

## 2016-01-13 NOTE — Progress Notes (Signed)
[facility] PROGRESS NOTE  [name] [id number] DOB: Jun 13, 1961 DOA: 01/08/2016 PCP: Benito Mccreedy, MD  Assessment/Plan: 55 y/o female with PMH of HTN, COPD, Bipolar Disorder, CKD was admitted with renal failure, and requiring dialysis for hyperkalemia. Patient was evaluated by nephrologist who thought that patient is not a good candidate for long term HD. Palliative care is involved and patient is being evaluated for  hospice admission. Prognosis is poor, cont supportive care  CKD-V. Progressive renal failure, ESRD. Hyperkalemia on admission. Patient was evaluated by nephrology as above. Cont supportive care  HTN. Stable off meds Bipolar disorder. Cont current regimen  COPD. No wheezing on exam. Cont bronchodilators   Disposition: awaiting hospice    Code Status: DNR Family Communication: no family at the bedside  (indicate person spoken with, relationship, and if by phone, the number) Disposition Plan: hospice    Consultants:  Palliative care  Nephrology   Procedures:  HD  Antibiotics:  none (indicate start date, and stop date if known)  HPI/Subjective: Alert, no distress, confused   Objective: Filed Vitals:   01/13/16 0626 01/13/16 0840  BP: 120/81 128/76  Pulse: 88 86  Temp: 97.7 F (36.5 C) 97.6 F (36.4 C)  Resp: 18 18    Intake/Output Summary (Last 24 hours) at 01/13/16 1339 Last data filed at 01/13/16 0830  Gross per 24 hour  Intake    476 ml  Output      0 ml  Net    476 ml   Filed Weights   01/08/16 1714 01/08/16 2056 01/12/16 2121  Weight: 88.451 kg (195 lb) 85.3 kg (188 lb 0.8 oz) 79.7 kg (175 lb 11.3 oz)    Exam:   General:  Confused   Cardiovascular: s1,s2 rrr  Respiratory: few crackles LL  Abdomen: soft, nt, nd   Musculoskeletal: mild pedal edema   Data Reviewed: Basic Metabolic Panel:  Recent Labs Lab 01/08/16 1809  01/09/16 0528 01/10/16 0643 01/11/16 0745 01/12/16 0611 01/13/16 0622  NA 141  <  > 145 149* 151* 150* 154*  K 4.1  < > 3.8 4.1 5.1 4.1 3.9  CL 102  < > 106 110 112* 112* 112*  CO2 24  --  26 26 27 28 28   GLUCOSE 109*  < > 77 78 83 80 84  BUN 94*  < > 88* 80* 77* 77* 78*  CREATININE 8.88*  < > 7.79* 6.69* 6.35* 6.38* 6.24*  CALCIUM 9.7  --  9.6 10.1 10.3 10.2 10.6*  MG 2.5*  --   --   --   --   --   --   PHOS 5.5*  --  5.0* 5.3*  --   --   --   < > = values in this interval not displayed. Liver Function Tests:  Recent Labs Lab 01/08/16 1809 01/09/16 0528 01/10/16 0643  AST 48*  --   --   ALT 28  --   --   ALKPHOS 93  --   --   BILITOT 0.8  --   --   PROT 6.4*  --   --   ALBUMIN 2.7* 2.3* 2.4*   No results for input(s): LIPASE, AMYLASE in the last 168 hours. No results for input(s): AMMONIA in the last 168 hours. CBC:  Recent Labs Lab 01/08/16 1809 01/08/16 1828 01/08/16 2141 01/10/16 0643  WBC 6.3  --   --  6.1  NEUTROABS 4.5  --   --  4.0  HGB 9.0* 10.9*  --  9.6*  HCT 29.2* 32.0* 27.3* 31.8*  MCV 110.6*  --   --  114.0*  PLT 99*  --   --  90*   Cardiac Enzymes:  Recent Labs Lab 01/08/16 2141  CKTOTAL 391*   BNP (last 3 results)  Recent Labs  12/08/15 1205  BNP 556.7*    ProBNP (last 3 results) No results for input(s): PROBNP in the last 8760 hours.  CBG:  Recent Labs Lab 01/11/16 1757 01/12/16 0749 01/12/16 1153 01/12/16 2218 01/13/16 0759  GLUCAP 80 83 205* 93 77    Recent Results (from the past 240 hour(s))  MRSA PCR Screening     Status: Abnormal   Collection Time: 01/08/16 10:03 PM  Result Value Ref Range Status   MRSA by PCR POSITIVE (A) NEGATIVE Final    Comment:        The GeneXpert MRSA Assay (FDA approved for NASAL specimens only), is one component of a comprehensive MRSA colonization surveillance program. It is not intended to diagnose MRSA infection nor to guide or monitor treatment for MRSA infections. RESULT CALLED TO, READ BACK BY AND VERIFIED WITHBarnabas Harries RN [id number] 01/09/16 MITCHELL,L       Studies: No results found.  Scheduled Meds: . amantadine  100 mg Oral BID  . ARIPiprazole  15 mg Oral BID  . atorvastatin  40 mg Oral QHS  . benztropine  0.5 mg Oral BID  . divalproex  500 mg Oral BID  . divalproex  750 mg Oral QHS  . feeding supplement (NEPRO CARB STEADY)  237 mL Oral BID BM  . heparin subcutaneous  5,000 Units Subcutaneous Q8H  . levothyroxine  75 mcg Oral QAC breakfast  . mometasone-formoterol  2 puff Inhalation BID  . multivitamin with minerals  1 tablet Oral Daily  . mupirocin ointment  1 application Nasal BID   Continuous Infusions: . dextrose 5 % and 0.45% NaCl      Principal Problem:   Renal failure (ARF), acute on chronic (HCC) Active Problems:   BIPOLAR AFFECTIVE DISORDER   Anemia in chronic kidney disease   HTN (hypertension)   COPD (chronic obstructive pulmonary disease) (HCC)   Hyperlipidemia   History of breast cancer in female   Palliative care encounter    Time spent:  >35 minutes     Kinnie Feil  [facility] Pager [phone]. If 7PM-7AM, please contact night-coverage at [email or web address], password [facility] 01/13/2016, 1:39 PM  LOS: 5 days

## 2016-01-13 NOTE — Progress Notes (Signed)
01/13/2016 3:29 PM  Report called to Altha Harm, [place] at [facility].  Social worker currently calling transport.  IV removed.    Princella Pellegrini

## 2016-01-13 NOTE — Clinical Social Work Note (Addendum)
Patient to return to [place] with Hospice services [facility]. Patient's brother updated regarding discharge. Patient to be transported via EMS. RN report number: [phone]  RN informed LCSW patient with bedside sitter. LCSW informed SNF regarding sitter status. Per SNF, patient still accepted and able to admit on 01/13/2016.  Lubertha Sayres, [facility]: [phone] Surgical: [phone]

## 2016-01-29 DEATH — deceased
